# Patient Record
Sex: Female | Born: 1960
Health system: Southern US, Community
[De-identification: ages and names within clinical notes are randomized; demographics above are authoritative.]

## PROBLEM LIST (undated history)

## (undated) DIAGNOSIS — M1990 Unspecified osteoarthritis, unspecified site: Secondary | ICD-10-CM

## (undated) DIAGNOSIS — Z862 Personal history of diseases of the blood and blood-forming organs and certain disorders involving the immune mechanism: Secondary | ICD-10-CM

## (undated) DIAGNOSIS — D649 Anemia, unspecified: Secondary | ICD-10-CM

## (undated) DIAGNOSIS — F329 Major depressive disorder, single episode, unspecified: Secondary | ICD-10-CM

## (undated) DIAGNOSIS — E785 Hyperlipidemia, unspecified: Secondary | ICD-10-CM

## (undated) DIAGNOSIS — M5442 Lumbago with sciatica, left side: Secondary | ICD-10-CM

## (undated) DIAGNOSIS — G4701 Insomnia due to medical condition: Secondary | ICD-10-CM

## (undated) DIAGNOSIS — M961 Postlaminectomy syndrome, not elsewhere classified: Secondary | ICD-10-CM

## (undated) DIAGNOSIS — D126 Benign neoplasm of colon, unspecified: Secondary | ICD-10-CM

## (undated) DIAGNOSIS — Z8489 Family history of other specified conditions: Secondary | ICD-10-CM

## (undated) DIAGNOSIS — G8929 Other chronic pain: Secondary | ICD-10-CM

## (undated) DIAGNOSIS — K219 Gastro-esophageal reflux disease without esophagitis: Secondary | ICD-10-CM

## (undated) DIAGNOSIS — E119 Type 2 diabetes mellitus without complications: Secondary | ICD-10-CM

## (undated) DIAGNOSIS — I10 Essential (primary) hypertension: Secondary | ICD-10-CM

## (undated) HISTORY — PX: COLONOSCOPY: SHX174

## (undated) HISTORY — DX: Other chronic pain: G89.29

## (undated) HISTORY — PX: BACK SURGERY: SHX140

## (undated) HISTORY — DX: Essential (primary) hypertension: I10

## (undated) HISTORY — DX: Type 2 diabetes mellitus without complications: E11.9

## (undated) HISTORY — DX: Insomnia due to medical condition: G47.01

## (undated) HISTORY — DX: Lumbago with sciatica, left side: M54.42

## (undated) HISTORY — DX: Major depressive disorder, single episode, unspecified: F32.9

## (undated) HISTORY — DX: Benign neoplasm of colon, unspecified: D12.6

## (undated) HISTORY — DX: Hyperlipidemia, unspecified: E78.5

---

## 1983-08-16 HISTORY — PX: TUBAL LIGATION: SHX77

## 1999-04-16 ENCOUNTER — Emergency Department (HOSPITAL_COMMUNITY): Admission: EM | Admit: 1999-04-16 | Discharge: 1999-04-16 | Payer: Self-pay | Admitting: Emergency Medicine

## 2000-08-28 ENCOUNTER — Emergency Department (HOSPITAL_COMMUNITY): Admission: EM | Admit: 2000-08-28 | Discharge: 2000-08-28 | Payer: Self-pay | Admitting: Emergency Medicine

## 2001-01-24 ENCOUNTER — Emergency Department (HOSPITAL_COMMUNITY): Admission: EM | Admit: 2001-01-24 | Discharge: 2001-01-24 | Payer: Self-pay | Admitting: Emergency Medicine

## 2005-09-22 ENCOUNTER — Emergency Department (HOSPITAL_COMMUNITY): Admission: EM | Admit: 2005-09-22 | Discharge: 2005-09-22 | Payer: Self-pay | Admitting: Emergency Medicine

## 2006-11-05 ENCOUNTER — Emergency Department (HOSPITAL_COMMUNITY): Admission: EM | Admit: 2006-11-05 | Discharge: 2006-11-05 | Payer: Self-pay | Admitting: Emergency Medicine

## 2007-10-20 ENCOUNTER — Emergency Department (HOSPITAL_COMMUNITY): Admission: EM | Admit: 2007-10-20 | Discharge: 2007-10-20 | Payer: Self-pay | Admitting: Emergency Medicine

## 2008-03-12 ENCOUNTER — Ambulatory Visit: Payer: Self-pay | Admitting: Internal Medicine

## 2008-03-12 ENCOUNTER — Encounter (INDEPENDENT_AMBULATORY_CARE_PROVIDER_SITE_OTHER): Payer: Self-pay | Admitting: Family Medicine

## 2008-03-12 LAB — CONVERTED CEMR LAB
Cholesterol: 232 mg/dL — ABNORMAL HIGH (ref 0–200)
HDL: 55 mg/dL (ref >39)
Total CHOL/HDL Ratio: 4.2
VLDL: 19 mg/dL (ref 0–40)

## 2008-03-14 ENCOUNTER — Ambulatory Visit: Payer: Self-pay | Admitting: Internal Medicine

## 2008-03-14 ENCOUNTER — Ambulatory Visit (HOSPITAL_COMMUNITY): Admission: RE | Admit: 2008-03-14 | Discharge: 2008-03-14 | Payer: Self-pay | Admitting: Internal Medicine

## 2008-05-24 ENCOUNTER — Encounter: Payer: Self-pay | Admitting: Internal Medicine

## 2008-05-24 ENCOUNTER — Ambulatory Visit: Payer: Self-pay | Admitting: Internal Medicine

## 2008-05-26 ENCOUNTER — Encounter (INDEPENDENT_AMBULATORY_CARE_PROVIDER_SITE_OTHER): Payer: Self-pay | Admitting: Internal Medicine

## 2008-05-27 ENCOUNTER — Ambulatory Visit: Payer: Self-pay | Admitting: *Deleted

## 2008-05-29 ENCOUNTER — Ambulatory Visit (HOSPITAL_COMMUNITY): Admission: RE | Admit: 2008-05-29 | Discharge: 2008-05-29 | Payer: Self-pay | Admitting: Family Medicine

## 2008-06-19 ENCOUNTER — Ambulatory Visit: Payer: Self-pay | Admitting: Internal Medicine

## 2008-07-16 ENCOUNTER — Ambulatory Visit: Payer: Self-pay | Admitting: Internal Medicine

## 2008-07-16 LAB — CONVERTED CEMR LAB
BUN: 24 mg/dL — ABNORMAL HIGH (ref 6–23)
CO2: 25 meq/L (ref 19–32)
Cholesterol: 160 mg/dL (ref 0–200)
Glucose, Bld: 86 mg/dL (ref 70–99)
LDL Cholesterol: 88 mg/dL (ref 0–99)
Potassium: 3.9 meq/L (ref 3.5–5.3)
Sodium: 141 meq/L (ref 135–145)
Total CHOL/HDL Ratio: 3.3
VLDL: 24 mg/dL (ref 0–40)

## 2008-08-22 ENCOUNTER — Ambulatory Visit: Payer: Self-pay | Admitting: Obstetrics & Gynecology

## 2008-08-22 ENCOUNTER — Other Ambulatory Visit: Admission: RE | Admit: 2008-08-22 | Discharge: 2008-08-22 | Payer: Self-pay | Admitting: Obstetrics and Gynecology

## 2008-08-25 ENCOUNTER — Encounter: Payer: Self-pay | Admitting: Obstetrics & Gynecology

## 2008-08-25 LAB — CONVERTED CEMR LAB
Trich, Wet Prep: NONE SEEN
Yeast Wet Prep HPF POC: NONE SEEN

## 2008-09-12 ENCOUNTER — Ambulatory Visit: Payer: Self-pay | Admitting: Family Medicine

## 2008-10-23 ENCOUNTER — Ambulatory Visit: Payer: Self-pay | Admitting: Internal Medicine

## 2009-02-07 ENCOUNTER — Encounter: Payer: Self-pay | Admitting: Emergency Medicine

## 2009-02-08 ENCOUNTER — Ambulatory Visit (HOSPITAL_COMMUNITY): Admission: AD | Admit: 2009-02-08 | Discharge: 2009-02-08 | Payer: Self-pay | Admitting: Obstetrics & Gynecology

## 2009-02-08 ENCOUNTER — Ambulatory Visit: Payer: Self-pay | Admitting: Obstetrics & Gynecology

## 2009-02-19 ENCOUNTER — Ambulatory Visit: Payer: Self-pay | Admitting: Obstetrics & Gynecology

## 2009-03-11 ENCOUNTER — Ambulatory Visit: Payer: Self-pay | Admitting: Internal Medicine

## 2009-03-12 ENCOUNTER — Ambulatory Visit (HOSPITAL_COMMUNITY): Admission: RE | Admit: 2009-03-12 | Discharge: 2009-03-12 | Payer: Self-pay | Admitting: Obstetrics and Gynecology

## 2009-03-12 ENCOUNTER — Ambulatory Visit (HOSPITAL_COMMUNITY): Admission: RE | Admit: 2009-03-12 | Discharge: 2009-03-12 | Payer: Self-pay | Admitting: Internal Medicine

## 2009-03-20 ENCOUNTER — Encounter (INDEPENDENT_AMBULATORY_CARE_PROVIDER_SITE_OTHER): Payer: Self-pay | Admitting: Obstetrics & Gynecology

## 2009-03-20 ENCOUNTER — Ambulatory Visit: Payer: Self-pay | Admitting: Obstetrics & Gynecology

## 2009-03-20 ENCOUNTER — Other Ambulatory Visit: Admission: RE | Admit: 2009-03-20 | Discharge: 2009-03-20 | Payer: Self-pay | Admitting: Obstetrics & Gynecology

## 2009-04-02 ENCOUNTER — Ambulatory Visit: Payer: Self-pay | Admitting: Obstetrics and Gynecology

## 2009-04-02 LAB — CONVERTED CEMR LAB
MCHC: 31.6 g/dL (ref 30.0–36.0)
MCV: 94.8 fL (ref 78.0–100.0)
Platelets: 355 10*3/uL (ref 150–400)
Potassium: 4.2 meq/L (ref 3.5–5.3)
RBC: 3.44 M/uL — ABNORMAL LOW (ref 3.87–5.11)
RDW: 15.5 % (ref 11.5–15.5)

## 2009-05-26 ENCOUNTER — Ambulatory Visit: Payer: Self-pay | Admitting: Obstetrics & Gynecology

## 2009-06-02 ENCOUNTER — Ambulatory Visit (HOSPITAL_COMMUNITY): Admission: RE | Admit: 2009-06-02 | Discharge: 2009-06-02 | Payer: Self-pay | Admitting: Internal Medicine

## 2009-06-10 ENCOUNTER — Ambulatory Visit: Payer: Self-pay | Admitting: Internal Medicine

## 2009-06-10 LAB — CONVERTED CEMR LAB
CO2: 24 meq/L (ref 19–32)
Calcium: 10.3 mg/dL (ref 8.4–10.5)
Cholesterol: 171 mg/dL (ref 0–200)
Creatinine, Ser: 0.68 mg/dL (ref 0.40–1.20)
Direct LDL: 111 mg/dL — ABNORMAL HIGH
HDL: 48 mg/dL (ref >39)
Sodium: 139 meq/L (ref 135–145)
Total CHOL/HDL Ratio: 3.6
Triglycerides: 71 mg/dL (ref <150)

## 2009-08-12 ENCOUNTER — Ambulatory Visit: Payer: Self-pay | Admitting: Obstetrics and Gynecology

## 2009-08-15 DIAGNOSIS — E782 Mixed hyperlipidemia: Secondary | ICD-10-CM | POA: Insufficient documentation

## 2009-08-15 DIAGNOSIS — E119 Type 2 diabetes mellitus without complications: Secondary | ICD-10-CM

## 2009-08-15 DIAGNOSIS — I10 Essential (primary) hypertension: Secondary | ICD-10-CM

## 2009-08-15 DIAGNOSIS — E785 Hyperlipidemia, unspecified: Secondary | ICD-10-CM

## 2009-08-15 HISTORY — DX: Type 2 diabetes mellitus without complications: E11.9

## 2009-08-15 HISTORY — DX: Essential (primary) hypertension: I10

## 2009-08-15 HISTORY — DX: Hyperlipidemia, unspecified: E78.5

## 2009-08-25 ENCOUNTER — Ambulatory Visit (HOSPITAL_COMMUNITY): Admission: RE | Admit: 2009-08-25 | Discharge: 2009-08-25 | Payer: Self-pay | Admitting: Family Medicine

## 2009-09-01 ENCOUNTER — Ambulatory Visit: Payer: Self-pay | Admitting: Internal Medicine

## 2009-09-01 LAB — CONVERTED CEMR LAB
Albumin: 5.1 g/dL (ref 3.5–5.2)
BUN: 16 mg/dL (ref 6–23)
CO2: 26 meq/L (ref 19–32)
Calcium: 10.2 mg/dL (ref 8.4–10.5)
Chloride: 102 meq/L (ref 96–112)
Cholesterol: 167 mg/dL (ref 0–200)
Creatinine, Ser: 0.65 mg/dL (ref 0.40–1.20)
Glucose, Bld: 107 mg/dL — ABNORMAL HIGH (ref 70–99)
HDL: 43 mg/dL (ref >39)
Total CHOL/HDL Ratio: 3.9
Triglycerides: 91 mg/dL (ref <150)

## 2009-09-09 ENCOUNTER — Ambulatory Visit: Payer: Self-pay | Admitting: Obstetrics and Gynecology

## 2009-09-25 ENCOUNTER — Encounter (INDEPENDENT_AMBULATORY_CARE_PROVIDER_SITE_OTHER): Payer: Self-pay | Admitting: Internal Medicine

## 2009-09-25 ENCOUNTER — Ambulatory Visit: Payer: Self-pay | Admitting: Family Medicine

## 2009-09-25 ENCOUNTER — Telehealth (INDEPENDENT_AMBULATORY_CARE_PROVIDER_SITE_OTHER): Payer: Self-pay | Admitting: *Deleted

## 2009-09-25 LAB — CONVERTED CEMR LAB
Alkaline Phosphatase: 63 units/L (ref 39–117)
BUN: 22 mg/dL (ref 6–23)
CO2: 24 meq/L (ref 19–32)
Glucose, Bld: 96 mg/dL (ref 70–99)
HCV Ab: NEGATIVE
Hep B S Ab: NEGATIVE
Hepatitis B Surface Ag: NEGATIVE
Total Bilirubin: 0.3 mg/dL (ref 0.3–1.2)

## 2009-10-06 ENCOUNTER — Emergency Department (HOSPITAL_COMMUNITY): Admission: EM | Admit: 2009-10-06 | Discharge: 2009-10-06 | Payer: Self-pay | Admitting: Emergency Medicine

## 2009-10-23 ENCOUNTER — Ambulatory Visit: Payer: Self-pay | Admitting: Internal Medicine

## 2009-10-29 ENCOUNTER — Ambulatory Visit (HOSPITAL_COMMUNITY): Admission: RE | Admit: 2009-10-29 | Discharge: 2009-10-29 | Payer: Self-pay | Admitting: Internal Medicine

## 2009-11-13 ENCOUNTER — Ambulatory Visit: Payer: Self-pay | Admitting: Internal Medicine

## 2009-12-24 ENCOUNTER — Ambulatory Visit: Payer: Self-pay | Admitting: Internal Medicine

## 2010-01-18 ENCOUNTER — Encounter: Admission: RE | Admit: 2010-01-18 | Discharge: 2010-03-03 | Payer: Self-pay | Admitting: Internal Medicine

## 2010-01-20 ENCOUNTER — Ambulatory Visit: Payer: Self-pay | Admitting: Obstetrics and Gynecology

## 2010-03-22 ENCOUNTER — Ambulatory Visit: Payer: Self-pay | Admitting: Internal Medicine

## 2010-03-24 ENCOUNTER — Ambulatory Visit (HOSPITAL_COMMUNITY): Admission: RE | Admit: 2010-03-24 | Discharge: 2010-03-24 | Payer: Self-pay | Admitting: Internal Medicine

## 2010-04-02 ENCOUNTER — Ambulatory Visit: Payer: Self-pay | Admitting: Obstetrics & Gynecology

## 2010-04-16 ENCOUNTER — Ambulatory Visit: Payer: Self-pay | Admitting: Internal Medicine

## 2010-05-17 ENCOUNTER — Encounter: Admission: RE | Admit: 2010-05-17 | Discharge: 2010-05-17 | Payer: Self-pay | Admitting: Internal Medicine

## 2010-06-09 ENCOUNTER — Ambulatory Visit (HOSPITAL_COMMUNITY): Admission: RE | Admit: 2010-06-09 | Discharge: 2010-06-09 | Payer: Self-pay | Admitting: Internal Medicine

## 2010-06-16 ENCOUNTER — Encounter: Admission: RE | Admit: 2010-06-16 | Discharge: 2010-06-16 | Payer: Self-pay | Admitting: Internal Medicine

## 2010-07-15 ENCOUNTER — Ambulatory Visit: Payer: Self-pay | Admitting: Obstetrics and Gynecology

## 2010-07-16 ENCOUNTER — Encounter: Payer: Self-pay | Admitting: Obstetrics and Gynecology

## 2010-07-16 LAB — CONVERTED CEMR LAB
Trich, Wet Prep: NONE SEEN
Yeast Wet Prep HPF POC: NONE SEEN

## 2010-07-27 ENCOUNTER — Ambulatory Visit (HOSPITAL_COMMUNITY)
Admission: RE | Admit: 2010-07-27 | Discharge: 2010-07-27 | Payer: Self-pay | Source: Home / Self Care | Attending: Family Medicine | Admitting: Family Medicine

## 2010-08-11 ENCOUNTER — Ambulatory Visit: Payer: Self-pay | Admitting: Obstetrics and Gynecology

## 2010-09-04 ENCOUNTER — Encounter: Payer: Self-pay | Admitting: Internal Medicine

## 2010-09-05 ENCOUNTER — Encounter: Payer: Self-pay | Admitting: Internal Medicine

## 2010-09-06 ENCOUNTER — Encounter: Payer: Self-pay | Admitting: Internal Medicine

## 2010-09-06 ENCOUNTER — Encounter: Payer: Self-pay | Admitting: Obstetrics & Gynecology

## 2010-09-14 NOTE — Progress Notes (Signed)
Summary: triage/rash  Phone Note Call from Patient   Caller: Patient Reason for Call: Talk to Nurse Summary of Call: Patient states she has had an itchy rash for about a week. She states she is broken out on her arm, neck and left hand.Marland KitchenMarland KitchenShe has been scratching it and it is now swollen with blisters. She denies any new soaps plants or material..Marland KitchenHer Lipator was increased 2-3 weeks ago.Marland KitchenAppointment made in Dr. Hinton Dyer acute schedule this afternnon. Initial call taken by: Conchita Paris,  September 25, 2009 12:03 PM

## 2010-10-13 ENCOUNTER — Encounter: Payer: Self-pay | Admitting: Obstetrics and Gynecology

## 2010-10-13 ENCOUNTER — Ambulatory Visit (INDEPENDENT_AMBULATORY_CARE_PROVIDER_SITE_OTHER): Payer: Self-pay | Admitting: Obstetrics & Gynecology

## 2010-10-13 DIAGNOSIS — N951 Menopausal and female climacteric states: Secondary | ICD-10-CM

## 2010-10-13 DIAGNOSIS — N898 Other specified noninflammatory disorders of vagina: Secondary | ICD-10-CM

## 2010-10-13 DIAGNOSIS — D259 Leiomyoma of uterus, unspecified: Secondary | ICD-10-CM

## 2010-10-13 LAB — CONVERTED CEMR LAB
Trich, Wet Prep: NONE SEEN
Yeast Wet Prep HPF POC: NONE SEEN

## 2010-11-05 NOTE — Progress Notes (Signed)
NAME:  Roberta Clark, Roberta Clark NO.:  [id number]  MEDICAL RECORD NO.:  [id number]           PATIENT TYPE:  A  LOCATION:  [facility]                   FACILITY:  [facility]  PHYSICIAN:  Allie Bossier, MD        DATE OF BIRTH:  1960-09-07  DATE OF SERVICE:  10/13/2010                                 CLINIC NOTE  Roberta Clark is a 50 year old single African American G2, P2.  She has 2 grown children and 3 grandchildren.  She comes in here as a followup from her hot flashes.  She has a known history of fibroids, but she is not having bleeding and does not want a hysterectomy.  She was started on Prozac last month or in December 2011 by Dr. Okey Dupre for treatment of menopausal symptoms.  They have helped somewhat, but she would like the dose increased.  She also complains of a fishy vaginal odor for the last few months.  She is using vinegar and water douches.  She has previously been diagnosed with BV on wet prep.  On exam today, her vagina does have a fishy odor and the vaginal discharge is consistent with BV on a wet prep.  ASSESSMENT/PLAN:  Hot flashes.  I will increase her Prozac to 40 mg in the morning and probable BV with Flagyl.  I have recommended she stop douching.     Allie Bossier, MD    MCD/MEDQ  D:  10/13/2010  T:  10/14/2010  Job:  [id number]

## 2010-11-18 NOTE — Progress Notes (Unsigned)
NAME:  Roberta Clark, Roberta Clark NO.:  [id number]  MEDICAL RECORD NO.:  [id number]          PATIENT TYPE:  LOCATION:  [facility]                     FACILITY:  PHYSICIAN:  Argentina Donovan, MD        DATE OF BIRTH:  1960-10-14  DATE OF SERVICE:  08/11/2010                                 CLINIC NOTE  The patient is a 50 year old African American female who has been on hormone replacement therapy.  Prior to that, she was complaining of significant menopausal symptoms.  Since being back on that, she is complaining more of pelvic discomfort, I think fibroids are back.  We got an ultrasound that did show some small fibroid changes and enlarged uterus.  So we are going to stop the hormones, switch her to Prozac 20 and see how she does on that.  IMPRESSION:  Menopausal symptoms.          ______________________________ Argentina Donovan, MD    PR/MEDQ  D:  08/11/2010  T:  08/12/2010  Job:  [id number]

## 2010-11-22 LAB — DIFFERENTIAL
Lymphocytes Relative: 38 % (ref 12–46)
Lymphs Abs: 3.4 10*3/uL (ref 0.7–4.0)
Monocytes Absolute: 0.5 10*3/uL (ref 0.1–1.0)
Monocytes Relative: 5 % (ref 3–12)
Neutro Abs: 4.9 10*3/uL (ref 1.7–7.7)

## 2010-11-22 LAB — URINALYSIS, ROUTINE W REFLEX MICROSCOPIC
Nitrite: NEGATIVE
Specific Gravity, Urine: 1.016 (ref 1.005–1.030)
Urobilinogen, UA: 1 mg/dL (ref 0.0–1.0)

## 2010-11-22 LAB — CBC
HCT: 32.3 % — ABNORMAL LOW (ref 36.0–46.0)
HCT: 39 % (ref 36.0–46.0)
Hemoglobin: 11 g/dL — ABNORMAL LOW (ref 12.0–15.0)
MCHC: 33.3 g/dL (ref 30.0–36.0)
MCHC: 33.9 g/dL (ref 30.0–36.0)
MCV: 93.7 fL (ref 78.0–100.0)
Platelets: 199 10*3/uL (ref 150–400)
Platelets: 205 10*3/uL (ref 150–400)
Platelets: 258 10*3/uL (ref 150–400)
RBC: 3.45 MIL/uL — ABNORMAL LOW (ref 3.87–5.11)
RDW: 13.2 % (ref 11.5–15.5)
RDW: 13.3 % (ref 11.5–15.5)
WBC: 5 10*3/uL (ref 4.0–10.5)
WBC: 6.8 10*3/uL (ref 4.0–10.5)

## 2010-11-22 LAB — RPR: RPR Ser Ql: NONREACTIVE

## 2010-11-22 LAB — GC/CHLAMYDIA PROBE AMP, GENITAL
Chlamydia, DNA Probe: NEGATIVE
GC Probe Amp, Genital: NEGATIVE

## 2010-11-22 LAB — WET PREP, GENITAL
WBC, Wet Prep HPF POC: NONE SEEN
Yeast Wet Prep HPF POC: NONE SEEN

## 2010-11-22 LAB — URINE MICROSCOPIC-ADD ON

## 2010-11-22 LAB — COMPREHENSIVE METABOLIC PANEL
Albumin: 4.5 g/dL (ref 3.5–5.2)
Alkaline Phosphatase: 41 U/L (ref 39–117)
BUN: 10 mg/dL (ref 6–23)
Calcium: 9.5 mg/dL (ref 8.4–10.5)
Creatinine, Ser: 0.67 mg/dL (ref 0.4–1.2)
Potassium: 4 mEq/L (ref 3.5–5.1)
Total Protein: 8.2 g/dL (ref 6.0–8.3)

## 2010-11-22 LAB — CREATININE, SERUM
GFR calc Af Amer: >60 mL/min (ref >60)
GFR calc non Af Amer: >60 mL/min (ref >60)

## 2010-11-22 LAB — URINE CULTURE: Colony Count: >=100000

## 2010-11-22 LAB — PREGNANCY, URINE: Preg Test, Ur: NEGATIVE

## 2010-11-22 LAB — FOLLICLE STIMULATING HORMONE: FSH: 19.4 m[IU]/mL

## 2010-11-29 LAB — POCT PREGNANCY, URINE: Preg Test, Ur: NEGATIVE

## 2010-11-29 LAB — POCT URINALYSIS DIP (DEVICE)
Bilirubin Urine: NEGATIVE
Glucose, UA: NEGATIVE mg/dL
Nitrite: NEGATIVE
Urobilinogen, UA: 0.2 mg/dL (ref 0.0–1.0)

## 2010-12-28 NOTE — Discharge Summary (Signed)
NAMEMarland Kitchen  JAPJI, KOK NO.:  [id number]   MEDICAL RECORD NO.:  [id number]          PATIENT TYPE:  EMS   LOCATION:  [facility]                           FACILITY:  [facility]   PHYSICIAN:  Tilda Burrow, M.D. DATE OF BIRTH:  [birth year]   DATE OF ADMISSION:  02/07/2009  DATE OF DISCHARGE:  02/08/2009                               DISCHARGE SUMMARY   ADMITTING DIAGNOSES:  1. Lower abdominal pain, severe, right greater than left.  2. Rule out urinary tract infection.  3. Rule out appendicitis.   DISCHARGE DIAGNOSES:  1. Right lower quadrant pain, improved.  2. Appendicitis, ruled out.  3. Urinary tract infection.  4. Right ovarian cyst in perimenopausal female.  5. Endometrial thickening in perimenopausal female with borderline      follicle-stimulating hormone.  6. Bacterial vaginosis.  7. Hypokalemia.   DISCHARGE MEDICATIONS:  1. Provera 10 mg p.o. daily x14 days to bring on menses.  2. Percocet 5/325, 20 tablets 1 p.o. q.6 h. p.r.n. pain.  3. Septra DS 1 p.o. b.i.d. x7 days for UTI.  4. Metronidazole 500 mg p.o. b.i.d. x7 days for vaginal infection.  5. K-Dur 20 mEq 30 tablets 1 p.o. daily for low potassium.   HOSPITAL SUMMARY:  This 50 year old female, gravida __________, para 2,  LMP 2 years ago with intermittent spotting since then at times, was  admitted at [facility] after referral from [facility] where she  was sent for right lower quadrant pain and suprapubic pain x2 days that  had become severe in nature.  The patient has had intermittent lower  abdominal discomfort for 2-3 months, before that have been acutely worse  for 2-3 days.  Evaluation by general surgeon was performed and GYN  consult requested and transferred to [facility] for assessment.  Pelvic evaluation had included an ultrasound which showed a 14 cm long  uterus showing fibroids, right adnexal, slight enlargement of the ovary  with pelvic fluid, the uterus showed endometrial  thickening with  endometrial stripe for 1.7 cm.  General Surgery consult had felt that  appendicitis was unlikely and GYN consultation requested.  The patient  was transferred to [facility] for evaluation.   Evaluation here was notable for GYN history with abnormal Pap smear in  January 2010, with negative colposcopy.  Followup Pap is scheduled for  August.  She is noted also to have bacterial vaginosis, a slightly  enlarged uterus that feels smaller than the 14 cm estimated on  ultrasound.  Laboratory data identified hypokalemia and hemoglobin was  noted during observation dropped from 13 to 11 during the time of  vigorous fluid hydration.   On February 08, 2009, a [facility] valve was obtained which was 41, which is a mid  ovulatory level slightly lower than postmenopausal levels though it is  noted that the patient had received some hydration prior to this blood  sampling.  The patient was reassessed by General Surgery and cleared for  appendicitis and given clear liquids earlier on the morning of February 08, 2009, and then regular diet at lunch.  Pain was gradually improving and  the patient was therefore sent home for followup at her GYN Clinic.   The patient has been scheduled to receive her Provera in addition to the  above-mentioned therapies, expected to have withdrawal menses in about 2  weeks and to have her repeat ultrasound performed shortly after  completion of that medication regimen.  She has a followup appointment  scheduled in August and this will be expanded  to include followup  review of the ultrasound to check for endometrial thickening and the  status of the right ovary.   Addendum: Appointment will be moved up to early July.      Tilda Burrow, M.D.  Electronically Signed     JVF/MEDQ  D:  02/08/2009  T:  02/09/2009  Job:  [id number]   cc:   Ardeth Sportsman, MD  [address] [place] [address]   [facility]

## 2010-12-28 NOTE — Group Therapy Note (Signed)
NAME:  Roberta Clark, Roberta Clark                      ACCOUNT NO.:  o   MEDICAL RECORD NO.:  [id number]          PATIENT TYPE:  WOC   LOCATION:  [facility]                   FACILITY:  [facility]   PHYSICIAN:  Tinnie Gens, MD        DATE OF BIRTH:  10-18-1960   DATE OF SERVICE:  09/12/2008                                  CLINIC NOTE   CHIEF COMPLAINT:  Followup colpo.   HISTORY OF PRESENT ILLNESS:  The patient is a 50 year old gravida 2,  para 2, who was seen for colposcopy for ASCUS and high-risk HPV on  August 22, 2008.  She was also treated for BV at that time.  However,  she has never gotten a prescription filled.  The patient also complains  of lower abdominal discomfort today though she has had __________  prescription, we will treat.  Her pathology is reviewed today.  Biopsy  results show koilocytic atypia consistent with HPV effect and a benign  ECC, which is consistent with her Pap smear.   PHYSICAL EXAMINATION:  VITAL SIGNS:  As noted in the chart.  GENERAL:  She is a well-developed, well-nourished female in no acute  distress.  ABDOMEN:  Soft, nontender, and nondistended.   IMPRESSION:  cervical intraepithelial neoplasia 1.   PLAN:  1. Followup Pap in 6 months.  2. Prescription for Flagyl.  I called in for this patient for history      of __________.  She will follow up with [facility] for other      medical needs.           ______________________________  Tinnie Gens, MD     TP/MEDQ  D:  09/12/2008  T:  09/13/2008  Job:  [id number]

## 2010-12-28 NOTE — Group Therapy Note (Signed)
NAME:  Roberta, Clark NO.:  [id number]   MEDICAL RECORD NO.:  [id number]          PATIENT TYPE:  WOC   LOCATION:  [facility]                   [facility]ACILITY:  [facility]   PHYSICIAN:  Argentina Donovan, MD        DATE OF BIRTH:  Dec 28, 1960   DATE OF SERVICE:  04/02/2009                                  CLINIC NOTE   The patient is a 50 year old African American female who in July was  admitted to the hospital for workup for right lower quadrant pain, ruled  out appendicitis and urinary tract infection, slightly increased uterine  size and on followup exam by Dr. Perlie Gold on March 20, 2009, she had a  tender fibroid on that side being 50 years old and the pain has been  going on severely for about 3 months and the ultrasound is completely  normal with exception of the small fibroids.  I think that a shot of  Depo-Lupron might biaz some time so that eventually when menopause kicks  and her pain will go away and the fibroids will start to shrink.  She  was admitted to the hospital, found to be hypokalemic, had been on  hypertension medication, and also was somewhat anemic, they did a workup  for her anemia and placed her on potassium and iron at discharge.  We  are going to follow that up today and requested to see whether she needs  to continue on either one of these things.  She does have chronic  constipation with a bowel movement every 2-3 days, currently she has  increased her fiber.  She seems to be taking enteral fluid from what she  describes everyday.  Her Pap smear and endometrial biopsy were normal.  She is able to get the Depo-Provera.  We will have her come back 2  months after that and evaluate her.  I wonder that once she first gets  it because of degeneration of fibroids, her pain may increase, but then  gradually she will get better.   IMPRESSION:  Right lower quadrant pain, probably secondary to her  leiomyomata, anemia, and hypokalemia.     ______________________________  Argentina Donovan, MD     PR/MEDQ  D:  04/02/2009  T:  04/03/2009  Job:  [id number]

## 2010-12-28 NOTE — Group Therapy Note (Signed)
NAME:  Roberta Clark, WINEGARDNER NO.:  [id number]   MEDICAL RECORD NO.:  [id number]          PATIENT TYPE:  WOC   LOCATION:  [facility]                   FACILITY:  [facility]   PHYSICIAN:  Dorthula Perfect, MD     DATE OF BIRTH:  [birth year]   DATE OF SERVICE:  03/20/2009                                  CLINIC NOTE   HISTORY:  This is a 50 year old black female who was seen by myself here  on February 19, 2009, with a problem of right lower quadrant pain that was  probably secondary to degenerating myoma, right ovarian cyst, and  postmenopausal bleeding, which was secondary to receiving Provera.  She  had not had any vaginal bleeding or a period for 2 years prior to this  time.  When she was seen on February 19, 2009, she presented for an  endometrial biopsy when probably she was having heavy withdrawal bleed  from the Provera.  So, I elected not to waste the procedure at that  time.  Her ultrasound that was done on March 12, 2009, revealed a uterus  length which was 11.2 cm.  She had multiple uterine fibroids.  Both  ovaries were pretty much normal.  She returns today for when she says  this is a Pap smear.  I believe, however, because of the fact that she  is not bleeding now and as endometrial biopsy needs to be done.  When  she had the withdrawal bleeding from the Provera, she bled heavy for  many, many days.  This was at least 3 weeks ago.   Her abdomen is soft.  There is some tenderness in the right lower  quadrant.  Bimanual exam, external genitalia and BUS glands were normal.  Vaginal was epithelialized as was the cervix.  Pap smear was done.  Uterus is upper limits of normal size to perhaps 6-8 weeks' size with a  very tender 4-5 cm myoma on the right side.  Left adnexa was negative.   IMPRESSION:  1. Right lower quadrant pain secondary to the myoma, which is probably      degenerating.  2. Postmenopausal bleeding.   DISPOSITION:  1. Pap smear.  2. Endometrial biopsy was  performed.  The uterus sounded 9 cm.  A lot      of tissue was aspirated.  The patient will return in 2 weeks for      results.  She may indeed require a hysterectomy.           ______________________________  Dorthula Perfect, MD     ER/MEDQ  D:  03/20/2009  T:  03/21/2009  Job:  [id number]

## 2010-12-28 NOTE — Group Therapy Note (Signed)
NAME:  Roberta Clark, Roberta Clark NO.:  [id number]   MEDICAL RECORD NO.:  [id number]          PATIENT TYPE:  WOC   [facility]ATION:  [facility]                   [facility]ACILITY:  [facility]   PHYSICIAN:  Dorthula Perfect, MD     DATE OF BIRTH:  1960/09/05   DATE OF SERVICE:  02/19/2009                                  CLINIC NOTE   A 50 year old black female was seen here at the hospital, transferred  from [facility] to the [facility] for severe right lower quadrant pain.  Evaluation included a urinary tract infection for which she was treated,  a 2.3-cm right ovarian cyst on both ultrasound and CT, and fair-sized  fibroids including a subserosal one on the right side going towards the  adnexal area that measured about 4.3 cm.  Her last menstrual period had  been in 2008.  When she was seen in the [facility]U, she was given Provera to  bring on her period and was given an appointment here today for an  endometrial biopsy.  Since she was seen at [facility] February 07, 2009, her  hurting has improved somewhat but is still on the right side.  She  started withdrawal bleeding yesterday which has been fairly heavy.   When she was seen in the hospital, a CT scan was done, part of which is  described above, which showed what was thought to be a phlebolith.  On  ultrasound, it was indeed that and was not a ureteral stone.   The only thing of note in her symptom review is that of constipation.  She will go up to a week without having a bowel movement and she also  uses a liquid laxative.  The bowel movement does seem to relieve the  hurting somewhat but not entirely.   EXAMINATION:  Today, blood pressure 120/86, weight 107 pounds, height 5  feet 4.  Examination of her abdomen revealed it to be soft.  The upper  abdomen was completely negative.  She had a moderate amount of right  lower quadrant hurting.  There was no rebound.  PELVIC:  External genitalia and BUS glands were normal.  Upon  introducing the speculum, a large  amount of dark blood came forth (she  started her period yesterday) and then further inspection in the vault  revealed it to be normal with a normal cervix located somewhat  anteriorly.  The uterus is upper limit of normal size with about a 4-cm  very tender myoma on the right lateral side.  I could not feel the right  ovary nor the left.  On the right side, probably because of tenderness.   Because of the heavy withdrawal period from the Provera, I do not think  an endometrial biopsy would give [place] any information today at all.  She  has been scheduled for a repeat ultrasound in the first part of August  but I am having that changed to the latter part of this month.  She will  then return here a week later for:  A.  Result.  B.  A repeat pelvic exam and evaluation of  the extremely tender myoma  and ovarian cyst and then the possibility of an endometrial biopsy.   DIAGNOSES:  1. Right lower quadrant pain probably secondary to a degenerating      myoma.  2. Right ovarian cyst.  3. Postmenopausal bleeding which is secondary to Provera withdrawal.   DISPOSITION:  1. Ultrasound will be moved up to the end of July.  2. She will be seen here 1 week later as described above.  3. A prescription for Percocet 5/325, 24 tablets.           ______________________________  Dorthula Perfect, MD     ER/MEDQ  D:  02/19/2009  T:  02/19/2009  Job:  [id number]

## 2010-12-28 NOTE — Consult Note (Signed)
Roberta Clark, Roberta Clark                  ACCOUNT NO.:  [id number]   MEDICAL RECORD NO.:  [id number]          PATIENT TYPE:  INP   LOCATION:  9318                          FACILITY:  [facility]   PHYSICIAN:  Ardeth Sportsman, MD     DATE OF BIRTH:  March 07, 1961   DATE OF CONSULTATION:  DATE OF DISCHARGE:                                 CONSULTATION   PRIMARY CARE PHYSICIAN:  [facility].   NORMAL GYN:  Dr. Shawnie Pons (Dr. Elsie Lincoln covering).   REQUESTING PHYSICIAN:  Dr. Clarene Duke who from [facility].   SURGEON:  Ardeth Sportsman, MD.   REASON FOR EVALUATION:  lower abdominal pelvic and suprapubic pain,  question of appendicitis.   HISTORY OF PRESENT ILLNESS:  Ms. Richoux is a 50 year old female with no  prior abdominal surgeries who has had abdominal pains for the past few  months.  They have usually been intermittent.  They are usually worse  suprapubic, but also right lower quadrant greater than left lower  quadrant.  They do not seem to be associated with eating or activity.  It reminds her of menstrual cramps.  Her last menstrual period was a  year and a half ago.  She has not had any spotting or bleeding until  this past week when she had an episode of a normal hard day where she  felt she had a normal period.  It was not associated any worsening,  cramping or bleeding.  She denies any vaginal bleeding or discharge.   Last night, this similar pain became constant and much more intense.  The pain became so intense that she actually threw up.  She denies any  sick contacts or change in her diet.  She denies any biliary colic, no  dysphagia, no heartburn or reflux.  She normally has a bowel movement  every day.  It does hurt to strain to urinate or strain to have a bowel  movement.  She denies any frank dysuria or pyuria. She has had urinary  tract infections in the past.  No pyelonephritis in the past.  No  history of inflammatory bowel disease  or bowel syndrome.   The  pain intensified in the emergency room.  She has had a rather  extensive evaluation by Clarene Duke and noted the patient did have a large  fibroid uterus and bilateral ovarian cysts postmenopausal.  She had  above-average free fluid expected for these diagnoses.  The appendix  could not be seen.  Based on these findings, surgical consultation was  requested.   She has tried to find a comfortable position and cannot find any  position that is comfortable for her.  She is a little restless.  She  does not seem to have one position that makes her better.  She had a  little discomfort driving in, but no severe pain with the bumps on the  road.   PAST MEDICAL HISTORY:  1. She has cervical epithelial neoplasia followed by Dr. Shawnie Pons.  2. Hypertension.  3. Tobacco abuse.  She claims she is down to just  a few cigarettes a      day.  4. Hypercholesterolemia.  5. Chronic pain.   GYN HISTORY:  She said her last menstrual was a year and a half ago.  She has had vaginal deliveries in the past that were without any  problems.  No gestational diabetes or preeclampsia or other perinatal  history that she can recall.  She denied tubal ligations or ectopic  pregnancies.  A diagnosis of PID in the past she could not recall.   PAST SURGICAL HISTORY:  No abdominal surgeries.   MEDICATIONS:  She is on:  1. Lipitor 20 mg daily.  2. Tramadol 50 mg every 8 hours p.r.n. pain.  3. Lisinopril/hydrochlorothiazide 10/12.5 daily.   ALLERGIES:  None known.   SOCIAL HISTORY:  Occasionally has some alcohol, no heavy drinking, no  binging, none recently.  No drug abuse.  She has a history of probably  about a 30 pack year history of tobacco but she is down to just a few  cigarettes a day.   FAMILY HISTORY:  Negative for inflammatory disease or GI disorders that  she had can recall or kidney stones.   REVIEW OF SYSTEMS:  Per HPI.  No fevers, chills, or sweats although she  does claim that she feels a little cold  in the ER in her gown only.  No  weight gain or weight loss.  EYES/ENT:  Negative.  CARDIAC/RESPIRATORY:  Negative.  MUSCULOSKELETAL: Negative.  PSYCHIATRIC:  Negative.  NEUROLOGICAL: Negative.  HEME/LYMPH:  Negative.  GI:  As noted above.  No hematochezia or melena, no hematemesis, no coffee-ground emesis.  HEPATIC/RENAL/ENDOCRINE:  Otherwise negative.  GYN:  As noted per HPI.  The rest is negative.   PHYSICAL EXAMINATION:  VITAL SIGNS:  Her temperature is 9.5, her pulse  is 94, respirations 16-20.  She has 10/10 pain, now it is down to around  4/10 pain.  She is 99-100%  SATS on room air.  GENERAL:  She is a well-developed, well-nourished female lying in bed,  anxious but consolable.   PSYCH:  She is anxious but consolable with no evidence of any dementia,  frank psychosis or paranoia.  EYES:  Pupils equal, round and react rive to light.  Her sclerae are  injected but not icteric.  NECK:  Supple, no masses.  Trachea is midline.  HEENT:  She is normocephalic.  Mucous membranes are moist, nasopharynx  and oropharynx are clear.  LYMPH:  No head, neck, axillary or groin lymphadenopathy.  BREASTS:  No obvious sores or lesions.  No nipple discharge.  CHEST:  Clear to auscultation bilaterally.  No wheezes, rales or  rhonchi.  No pain to rib or sternal compression.  HEART:  Regular rate and rhythm.  No murmurs, gallops or rubs.  ABDOMEN:  Soft, a little bit doughy but I would not say distended.  She  is completely nontender in her upper abdomen.  No umbilical discomfort.  She denies ever having any periumbilical pain.  She has no pain over  McBurney's point.  She has no Rovsing's sign.  In her suprapubic region,  she has the most discomfort but no guarding.  She seems to be also  tender in the right groin, but no evidence of any inguinal hernias.  She  has maybe some discomfort to deep pressure but not to percussion or bed  shake.  The pelvic I did not redo based on Dr. Clarene Duke had done  this.  She had noted some more right  fornix/adnexal tenderness greater than  suprapubic region.  No major left adnexal tenderness.  She has positive  clue cells but no white cells.  No yeast noted.  EXTREMITIES:  No clubbing, cyanosis or edema.  RECTAL:  Deferred per patient request.  MUSCULOSKELETAL:  Normal range of motion of the shoulders, elbows,  wrists, hips, knees and ankles.   LABORATORY VALUES:  Her white count is 9 with no left shift.  Hemoglobin  is 13.  Electrolytes are all normal.  LFTs are normal.  Lipase is  normal.  CT scan shows free fluid in the right pelvis.  She has a large  fibroid uterus with bilateral ovarian cysts.  Her endometrium is  prominent.  The cysts around 2-5 cm in size.  Transvaginal pelvic  ultrasound also confirmed similar findings with some moderate free  fluid.  Of note, they could not see the appendix based on ultrasound or  CT scan.  She is rather thin and there is not good fat.  I see no  stranding along her right pericolic gutter around her cecum.  The fluid  is a thin stripe around her fibroid uterus and just behind her cecum but  mostly it is down in the pelvis to my examination.   ASSESSMENT/PLAN:  A 50 year old female with intermittent abdominal pain  for several months now, constant and intense with a positive urinalysis,  new menorrhagia, fibroid uterus, has bilateral ovarian cysts, post  menopausal, free fluid.   This not a classic history.  No physical for appendicitis, although the  fact that she does have some more right-sided pain that seems to be  worse on the right than the left, even though the midline is more  dominant is concerning.  I laid off several options for her.  The most  aggressive  would be to do a diagnostic laparoscopy with appendectomy to  make sure we are not missing anything else.  She did not want to have  surgery at this time.  Another reasonable option is to admit with serial  exams.  I discussed the case  with Dr. Elsie Lincoln who is covering for  Dr. Tinnie Gens as well as Dr. Clarene Duke.  Dr. Penne Lash offered to admit  the patient on the GYN service given abnormal GYN findings and  appendicitis less likely.  I will follow with her for this.   Ciprofloxacin for urinary tract infection.  I will allow on Dr. Penne Lash  to overrule that if she wishes.   At this point, suspicion for pelvic inflammatory disease was low with no  white cells, so no triple antibiotic therapy at this time although it  can be reevaluated.  If her pain does not improve or worsens, then  diagnostic laparoscopy with probable appendectomy tomorrow.  The  technique of  the procedure was discussed.  The risks, benefits, and  alternatives  were discussed.  Different diagnoses were discussed.  The  patient was hold off for right now as far as definitely doing instant  laparoscopy.   Although there is some question of a plain film calcification in the  right for a possible ureteral stone, CT scan does not confirm this  it  looks like phleboliths only.  There was no hydroureter and I discussed  this case at length with Dr. Kearney Hard as well as with Dr. Alfredo Batty.   Hold on any blood pressure medicines for now unless Dr. Penne Lash feels  otherwise.   Hold tramadol for now.   Palliate nausea.  Aggressive fluid resuscitation.   Also, on CT scan, there is no evidence of bowel obstruction.  There is  no hernia.  Contrast easily flows into the right colon.  The bowel does  not seem distended.  She has some stool, but I would not say that she is  profoundly constipated.  Her gallbladder looks normal as well.  There is  no free fluid or stranding along the duodenal sweep or other issues that  I can see.      Ardeth Sportsman, MD  Electronically Signed     SCG/MEDQ  D:  02/07/2009  T:  02/08/2009  Job:  [id number]   cc:   Lesly Dukes, M.D.   Samuel Jester, DO

## 2011-01-24 ENCOUNTER — Ambulatory Visit: Payer: Self-pay | Attending: Family Medicine

## 2011-02-08 ENCOUNTER — Ambulatory Visit: Payer: Self-pay | Admitting: Physical Therapy

## 2011-02-23 ENCOUNTER — Ambulatory Visit: Payer: Self-pay | Attending: Family Medicine | Admitting: Rehabilitation

## 2011-02-23 DIAGNOSIS — M25559 Pain in unspecified hip: Secondary | ICD-10-CM | POA: Insufficient documentation

## 2011-02-23 DIAGNOSIS — M2569 Stiffness of other specified joint, not elsewhere classified: Secondary | ICD-10-CM | POA: Insufficient documentation

## 2011-02-23 DIAGNOSIS — IMO0001 Reserved for inherently not codable concepts without codable children: Secondary | ICD-10-CM | POA: Insufficient documentation

## 2011-02-23 DIAGNOSIS — M545 Low back pain, unspecified: Secondary | ICD-10-CM | POA: Insufficient documentation

## 2011-03-07 ENCOUNTER — Ambulatory Visit: Payer: Self-pay | Admitting: Physical Therapy

## 2011-03-09 ENCOUNTER — Ambulatory Visit: Payer: Self-pay | Admitting: Rehabilitation

## 2011-03-15 ENCOUNTER — Encounter: Payer: Self-pay | Admitting: Rehabilitation

## 2011-03-17 ENCOUNTER — Ambulatory Visit: Payer: Self-pay | Attending: Rehabilitation | Admitting: Rehabilitation

## 2011-03-17 DIAGNOSIS — M545 Low back pain, unspecified: Secondary | ICD-10-CM | POA: Insufficient documentation

## 2011-03-17 DIAGNOSIS — M25559 Pain in unspecified hip: Secondary | ICD-10-CM | POA: Insufficient documentation

## 2011-03-17 DIAGNOSIS — M2569 Stiffness of other specified joint, not elsewhere classified: Secondary | ICD-10-CM | POA: Insufficient documentation

## 2011-03-17 DIAGNOSIS — IMO0001 Reserved for inherently not codable concepts without codable children: Secondary | ICD-10-CM | POA: Insufficient documentation

## 2011-03-22 ENCOUNTER — Ambulatory Visit: Payer: Self-pay | Admitting: Rehabilitation

## 2011-03-29 ENCOUNTER — Ambulatory Visit: Payer: Self-pay | Admitting: Physical Therapy

## 2011-04-04 ENCOUNTER — Encounter: Payer: Self-pay | Admitting: Physical Therapy

## 2011-04-06 ENCOUNTER — Encounter: Payer: Self-pay | Admitting: Physical Therapy

## 2011-04-11 ENCOUNTER — Encounter: Payer: Self-pay | Admitting: Physical Therapy

## 2011-04-14 ENCOUNTER — Encounter: Payer: Self-pay | Admitting: Rehabilitation

## 2011-05-11 ENCOUNTER — Other Ambulatory Visit (HOSPITAL_COMMUNITY): Payer: Self-pay | Admitting: Family Medicine

## 2011-05-11 DIAGNOSIS — Z1231 Encounter for screening mammogram for malignant neoplasm of breast: Secondary | ICD-10-CM

## 2011-06-20 ENCOUNTER — Ambulatory Visit (HOSPITAL_COMMUNITY)
Admission: RE | Admit: 2011-06-20 | Discharge: 2011-06-20 | Disposition: A | Discharge status: Home / Self Care | Payer: Self-pay | Source: Ambulatory Visit | Attending: Family Medicine | Admitting: Family Medicine

## 2011-06-20 DIAGNOSIS — Z1231 Encounter for screening mammogram for malignant neoplasm of breast: Secondary | ICD-10-CM | POA: Insufficient documentation

## 2011-07-27 ENCOUNTER — Ambulatory Visit: Payer: Self-pay | Admitting: Advanced Practice Midwife

## 2011-08-22 ENCOUNTER — Ambulatory Visit: Payer: Self-pay | Admitting: Obstetrics and Gynecology

## 2011-09-02 ENCOUNTER — Ambulatory Visit (INDEPENDENT_AMBULATORY_CARE_PROVIDER_SITE_OTHER): Payer: Self-pay | Admitting: Obstetrics and Gynecology

## 2011-09-02 ENCOUNTER — Encounter: Payer: Self-pay | Admitting: Obstetrics and Gynecology

## 2011-09-02 VITALS — BP 133/85 | HR 72 | Temp 97.5°F | Ht 64.0 in | Wt 117.1 lb

## 2011-09-02 DIAGNOSIS — D219 Benign neoplasm of connective and other soft tissue, unspecified: Secondary | ICD-10-CM | POA: Insufficient documentation

## 2011-09-02 DIAGNOSIS — F17200 Nicotine dependence, unspecified, uncomplicated: Secondary | ICD-10-CM | POA: Insufficient documentation

## 2011-09-02 DIAGNOSIS — A499 Bacterial infection, unspecified: Secondary | ICD-10-CM

## 2011-09-02 DIAGNOSIS — D259 Leiomyoma of uterus, unspecified: Secondary | ICD-10-CM

## 2011-09-02 DIAGNOSIS — N760 Acute vaginitis: Secondary | ICD-10-CM

## 2011-09-02 DIAGNOSIS — Z01419 Encounter for gynecological examination (general) (routine) without abnormal findings: Secondary | ICD-10-CM

## 2011-09-02 MED ORDER — METRONIDAZOLE 500 MG PO TABS: 500.0000 mg | ORAL_TABLET | Freq: Two times a day (BID) | ORAL | Status: DC

## 2011-09-02 NOTE — Progress Notes (Signed)
Subjective:    Roberta Clark is a 51 y.o. female who presents for an annual exam. She is G2P2002 and about 3 yrs postmenopausal. She is here for Pap and Gyn exam and gets primary care through Dr. Andrey Campanile at [facility]. Last Paps were normal in 2011 and 2010. Pap showed ASCUS in 2009. Mammogram was normal 06/2011. Has never had BMD and would like that after we descussed her RF: thin, smoker, sedentary lifestyle and not taking Ca++ or Vit D.   She is still taking Prozac prescribed here after her HRT was stopped due to symptomatic fibroids. Denies abd pain or bleeding.  She has had a malordorous vaginal discharge to the point where she douched about a month ago and the malodor is still present. No vulovaginal pruritis. The patient is not sexually active. GYN screening history: see above. . The patient wears seatbelts: yes. The patient participates in regular exercise: no. Has the patient ever been transfused or tattooed?: no. The patient reports that there is not domestic violence in her life. Mutually monogamous with same partner x 6 yrs and declines STI testing.  Menstrual History: OB History    Grav Para Term Preterm Abortions TAB SAB Ect Mult Living   2 2 2  0 0 0 0 0 0 2       No LMP recorded. Patient is postmenopausal.    The following portions of the patient's history were reviewed and updated as appropriate: allergies, current medications, past family history, past medical history, past social history, past surgical history and problem list.  Review of Systems Pertinent items are noted in HPI.    Objective:    BP 133/85  Pulse 72  Temp(Src) 97.5 F (36.4 C) (Oral)  Ht 5\' 4"  (1.626 m)  Wt 117 lb 1.6 oz (53.116 kg)  BMI 20.10 kg/m2  General Appearance:    Alert, cooperative, no distress, appears stated age  Head:    Normocephalic, without obvious abnormality, atraumatic  Eyes:    PERRL, conjunctiva/corneas clear, EOM's intact, fundi    benign, both eyes  Ears:    Normal TM's and  external ear canals, both ears  Nose:   Nares normal, septum midline, mucosa normal, no drainage    or sinus tenderness  Throat:   Lips, mucosa, and tongue normal; teeth and gums normal  Neck:   Supple, symmetrical, trachea midline, rt ant cervical sl tender lymph node (noted by pt x 1 wk)   thyroid:  no enlargement/tenderness/nodules; no carotid   bruit or JVD  Back:     Symmetric, no curvature, ROM normal, no CVA tenderness  Lungs:     Clear to auscultation bilaterally, respirations unlabored  Chest Wall:    No tenderness or deformity   Heart:    Regular rate and rhythm, S1 and S2 normal, no murmur, rub   or gallop  Breast Exam:    No tenderness, masses, or nipple abnormality  Abdomen:     Soft, non-tender, bowel sounds active all four quadrants,    no masses, no organomegaly  Genitalia:    Normal female without lesion, discharge or tenderness. Mod amt frothy whitish discharge in vault. Cx parous, clean. Ut slightly enlarged? Fibroid felt in rt LUS, no adnexal tenderness or mass.     Extremities:   Extremities normal, atraumatic, no cyanosis or edema  Pulses:   2+ and symmetric all extremities  Skin:   Skin color, texture, turgor normal, no rashes or lesions  Lymph nodes:   Cervical,  supraclavicular, and axillary nodes normal (see above)  Neuro: grossly intact    .   Assessment:    Healthy female exam.  Presumptive BV   Plan:     All questions answered. Await pap smear results. Discussed healthy lifestyle modifications. Agricultural engineer distributed. Thin prep Pap smear. Wet prep. BMD study  Rx Flagyl Start regular exercise and walking. Start Ca++ 1200 mg/d and Vit D Smoking cessation encouraged.  Do not douche BSE reviewed

## 2011-09-02 NOTE — Patient Instructions (Signed)
Smoking Cessation This document explains the best ways for you to quit smoking and new treatments to help. It lists new medicines that can double or triple your chances of quitting and quitting for good. It also considers ways to avoid relapses and concerns you may have about quitting, including weight gain. NICOTINE: A POWERFUL ADDICTION If you have tried to quit smoking, you know how hard it can be. It is hard because nicotine is a very addictive drug. For some people, it can be as addictive as heroin or cocaine. Usually, people make 2 or 3 tries, or more, before finally being able to quit. Each time you try to quit, you can learn about what helps and what hurts. Quitting takes hard work and a lot of effort, but you can quit smoking. QUITTING SMOKING IS ONE OF THE MOST IMPORTANT THINGS YOU WILL EVER DO.  You will live longer, feel better, and live better.   The impact on your body of quitting smoking is felt almost immediately:   Within 20 minutes, blood pressure decreases. Pulse returns to its normal level.   After 8 hours, carbon monoxide levels in the blood return to normal. Oxygen level increases.   After 24 hours, chance of heart attack starts to decrease. Breath, hair, and body stop smelling like smoke.   After 48 hours, damaged nerve endings begin to recover. Sense of taste and smell improve.   After 72 hours, the body is virtually free of nicotine. Bronchial tubes relax and breathing becomes easier.   After 2 to 12 weeks, lungs can hold more air. Exercise becomes easier and circulation improves.   Quitting will reduce your risk of having a heart attack, stroke, cancer, or lung disease:   After 1 year, the risk of coronary heart disease is cut in half.   After 5 years, the risk of stroke falls to the same as a nonsmoker.   After 10 years, the risk of lung cancer is cut in half and the risk of other cancers decreases significantly.   After 15 years, the risk of coronary heart  disease drops, usually to the level of a nonsmoker.   If you are pregnant, quitting smoking will improve your chances of having a healthy baby.   The people you live with, especially your children, will be healthier.   You will have extra money to spend on things other than cigarettes.  FIVE KEYS TO QUITTING Studies have shown that these 5 steps will help you quit smoking and quit for good. You have the best chances of quitting if you use them together: 1. Get ready.  2. Get support and encouragement.  3. Learn new skills and behaviors.  4. Get medicine to reduce your nicotine addiction and use it correctly.  5. Be prepared for relapse or difficult situations. Be determined to continue trying to quit, even if you do not succeed at first.  1. GET READY  Set a quit date.   Change your environment.   Get rid of ALL cigarettes, ashtrays, matches, and lighters in your home, car, and place of work.   Do not let people smoke in your home.   Review your past attempts to quit. Think about what worked and what did not.   Once you quit, do not smoke. NOT EVEN A PUFF!  2. GET SUPPORT AND ENCOURAGEMENT Studies have shown that you have a better chance of being successful if you have help. You can get support in many ways.  Tell  your family, friends, and coworkers that you are going to quit and need their support. Ask them not to smoke around you.   Talk to your caregivers (doctor, dentist, nurse, pharmacist, psychologist, and/or smoking counselor).   Get individual, group, or telephone counseling and support. The more counseling you have, the better your chances are of quitting. Programs are available at local hospitals and health centers. Call your local health department for information about programs in your area.   Spiritual beliefs and practices may help some smokers quit.   Quit meters are small computer programs online or downloadable that keep track of quit statistics, such as amount  of "quit-time," cigarettes not smoked, and money saved.   Many smokers find one or more of the many self-help books available useful in helping them quit and stay off tobacco.  3. LEARN NEW SKILLS AND BEHAVIORS  Try to distract yourself from urges to smoke. Talk to someone, go for a walk, or occupy your time with a task.   When you first try to quit, change your routine. Take a different route to work. Drink tea instead of coffee. Eat breakfast in a different place.   Do something to reduce your stress. Take a hot bath, exercise, or read a book.   Plan something enjoyable to do every day. Reward yourself for not smoking.   Explore interactive web-based programs that specialize in helping you quit.  4. GET MEDICINE AND USE IT CORRECTLY Medicines can help you stop smoking and decrease the urge to smoke. Combining medicine with the above behavioral methods and support can quadruple your chances of successfully quitting smoking. The [place]. Food and Drug Administration (FDA) has approved 7 medicines to help you quit smoking. These medicines fall into 3 categories.  Nicotine replacement therapy (delivers nicotine to your body without the negative effects and risks of smoking):   Nicotine gum: Available over-the-counter.   Nicotine lozenges: Available over-the-counter.   Nicotine inhaler: Available by prescription.   Nicotine nasal spray: Available by prescription.   Nicotine skin patches (transdermal): Available by prescription and over-the-counter.   Antidepressant medicine (helps people abstain from smoking, but how this works is unknown):   Bupropion sustained-release (SR) tablets: Available by prescription.   Nicotinic receptor partial agonist (simulates the effect of nicotine in your brain):   Varenicline tartrate tablets: Available by prescription.   Ask your caregiver for advice about which medicines to use and how to use them. Carefully read the information on the package.    Everyone who is trying to quit may benefit from using a medicine. If you are pregnant or trying to become pregnant, nursing an infant, you are under age 18, or you smoke fewer than 10 cigarettes per day, talk to your caregiver before taking any nicotine replacement medicines.   You should stop using a nicotine replacement product and call your caregiver if you experience nausea, dizziness, weakness, vomiting, fast or irregular heartbeat, mouth problems with the lozenge or gum, or redness or swelling of the skin around the patch that does not go away.   Do not use any other product containing nicotine while using a nicotine replacement product.   Talk to your caregiver before using these products if you have diabetes, heart disease, asthma, stomach ulcers, you had a recent heart attack, you have high blood pressure that is not controlled with medicine, a history of irregular heartbeat, or you have been prescribed medicine to help you quit smoking.  5. BE PREPARED FOR RELAPSE OR   DIFFICULT SITUATIONS  Most relapses occur within the first 3 months after quitting. Do not be discouraged if you start smoking again. Remember, most people try several times before they finally quit.   You may have symptoms of withdrawal because your body is used to nicotine. You may crave cigarettes, be irritable, feel very hungry, cough often, get headaches, or have difficulty concentrating.   The withdrawal symptoms are only temporary. They are strongest when you first quit, but they will go away within 10 to 14 days.  Here are some difficult situations to watch for:  Alcohol. Avoid drinking alcohol. Drinking lowers your chances of successfully quitting.   Caffeine. Try to reduce the amount of caffeine you consume. It also lowers your chances of successfully quitting.   Other smokers. Being around smoking can make you want to smoke. Avoid smokers.   Weight gain. Many smokers will gain weight when they quit, usually  less than 10 pounds. Eat a healthy diet and stay active. Do not let weight gain distract you from your main goal, quitting smoking. Some medicines that help you quit smoking may also help delay weight gain. You can always lose the weight gained after you quit.   Bad mood or depression. There are a lot of ways to improve your mood other than smoking.  If you are having problems with any of these situations, talk to your caregiver. SPECIAL SITUATIONS AND CONDITIONS Studies suggest that everyone can quit smoking. Your situation or condition can give you a special reason to quit.  Pregnant women/new mothers: By quitting, you protect your baby's health and your own.   Hospitalized patients: By quitting, you reduce health problems and help healing.   Heart attack patients: By quitting, you reduce your risk of a second heart attack.   Lung, head, and neck cancer patients: By quitting, you reduce your chance of a second cancer.   Parents of children and adolescents: By quitting, you protect your children from illnesses caused by secondhand smoke.  QUESTIONS TO THINK ABOUT Think about the following questions before you try to stop smoking. You may want to talk about your answers with your caregiver.  Why do you want to quit?   If you tried to quit in the past, what helped and what did not?   What will be the most difficult situations for you after you quit? How will you plan to handle them?   Who can help you through the tough times? Your family? Friends? Caregiver?   What pleasures do you get from smoking? What ways can you still get pleasure if you quit?  Here are some questions to ask your caregiver:  How can you help me to be successful at quitting?   What medicine do you think would be best for me and how should I take it?   What should I do if I need more help?   What is smoking withdrawal like? How can I get information on withdrawal?  Quitting takes hard work and a lot of effort,  but you can quit smoking. FOR MORE INFORMATION  Smokefree.gov (http://www.davis-sullivan.com/) provides free, accurate, evidence-based information and professional assistance to help support the immediate and long-term needs of people trying to quit smoking. Document Released: 07/26/2001 Document Revised: 04/13/2011 Document Reviewed: 05/18/2009 [facility] Patient Information 2012 [place], [place].Place postmenopausal annual exam patient instructions here.

## 2011-09-03 LAB — WET PREP, GENITAL: Trich, Wet Prep: NONE SEEN

## 2011-09-09 ENCOUNTER — Telehealth: Payer: Self-pay | Admitting: *Deleted

## 2011-09-09 DIAGNOSIS — B9689 Other specified bacterial agents as the cause of diseases classified elsewhere: Secondary | ICD-10-CM

## 2011-09-09 NOTE — Telephone Encounter (Signed)
Pt was in office and had a prescription sent to [facility]. They are unable to fill it. Would like for [place] to call it into [facility] on [address], and if they can't fill it call it into [facility].

## 2011-09-12 MED ORDER — METRONIDAZOLE 500 MG PO TABS: 500.0000 mg | ORAL_TABLET | Freq: Two times a day (BID) | ORAL | Status: AC

## 2011-09-12 NOTE — Telephone Encounter (Signed)
Called [facility] pharmacy- spoke w/Brian who verified that they can only fill narcotic Rx for pts with the orange card. I phoned in the Rx for metronidazole to [facility] pharmacy [phone].  I left message for pt that the Rx has been phoned in to [facility] per her request.

## 2012-03-27 ENCOUNTER — Encounter (HOSPITAL_COMMUNITY): Payer: Self-pay | Admitting: Emergency Medicine

## 2012-03-27 ENCOUNTER — Emergency Department (HOSPITAL_COMMUNITY)
Admission: EM | Admit: 2012-03-27 | Discharge: 2012-03-27 | Disposition: A | Discharge status: Home / Self Care | Payer: Self-pay | Attending: Emergency Medicine | Admitting: Emergency Medicine

## 2012-03-27 DIAGNOSIS — G8929 Other chronic pain: Secondary | ICD-10-CM | POA: Insufficient documentation

## 2012-03-27 DIAGNOSIS — E871 Hypo-osmolality and hyponatremia: Secondary | ICD-10-CM | POA: Insufficient documentation

## 2012-03-27 DIAGNOSIS — N390 Urinary tract infection, site not specified: Secondary | ICD-10-CM | POA: Insufficient documentation

## 2012-03-27 DIAGNOSIS — E785 Hyperlipidemia, unspecified: Secondary | ICD-10-CM | POA: Insufficient documentation

## 2012-03-27 DIAGNOSIS — F329 Major depressive disorder, single episode, unspecified: Secondary | ICD-10-CM | POA: Insufficient documentation

## 2012-03-27 DIAGNOSIS — I10 Essential (primary) hypertension: Secondary | ICD-10-CM | POA: Insufficient documentation

## 2012-03-27 DIAGNOSIS — F3289 Other specified depressive episodes: Secondary | ICD-10-CM | POA: Insufficient documentation

## 2012-03-27 DIAGNOSIS — F17200 Nicotine dependence, unspecified, uncomplicated: Secondary | ICD-10-CM | POA: Insufficient documentation

## 2012-03-27 DIAGNOSIS — D649 Anemia, unspecified: Secondary | ICD-10-CM | POA: Insufficient documentation

## 2012-03-27 LAB — CBC WITH DIFFERENTIAL/PLATELET
Basophils Absolute: 0 10*3/uL (ref 0.0–0.1)
Basophils Relative: 0 % (ref 0–1)
HCT: 35.5 % — ABNORMAL LOW (ref 36.0–46.0)
MCHC: 33 g/dL (ref 30.0–36.0)
Monocytes Relative: 9 % (ref 3–12)
Neutro Abs: 6.2 10*3/uL (ref 1.7–7.7)
Neutrophils Relative %: 62 % (ref 43–77)
RBC: 4.04 MIL/uL (ref 3.87–5.11)
WBC: 10 10*3/uL (ref 4.0–10.5)

## 2012-03-27 LAB — COMPREHENSIVE METABOLIC PANEL
Albumin: 4.1 g/dL (ref 3.5–5.2)
Alkaline Phosphatase: 76 U/L (ref 39–117)
BUN: 17 mg/dL (ref 6–23)
Potassium: 4.2 mEq/L (ref 3.5–5.1)
Sodium: 130 mEq/L — ABNORMAL LOW (ref 135–145)
Total Protein: 8.4 g/dL — ABNORMAL HIGH (ref 6.0–8.3)

## 2012-03-27 LAB — URINALYSIS, ROUTINE W REFLEX MICROSCOPIC
Bilirubin Urine: NEGATIVE
Glucose, UA: NEGATIVE mg/dL
Hgb urine dipstick: NEGATIVE
Protein, ur: NEGATIVE mg/dL
Urobilinogen, UA: 0.2 mg/dL (ref 0.0–1.0)

## 2012-03-27 LAB — URINE MICROSCOPIC-ADD ON

## 2012-03-27 MED ORDER — ONDANSETRON 8 MG PO TBDP: 8.0000 mg | ORAL_TABLET | Freq: Three times a day (TID) | ORAL | Status: AC | PRN

## 2012-03-27 MED ORDER — CEPHALEXIN 250 MG PO CAPS: 250.0000 mg | ORAL_CAPSULE | Freq: Four times a day (QID) | ORAL | Status: AC

## 2012-03-27 MED ORDER — CEPHALEXIN 500 MG PO CAPS: 500.0000 mg | ORAL_CAPSULE | Freq: Four times a day (QID) | ORAL | Status: AC

## 2012-03-27 NOTE — ED Provider Notes (Signed)
History     CSN: [id number]  Arrival date & time 03/27/12  1710   First MD Initiated Contact with Patient 03/27/12 2101      Chief Complaint  Patient presents with  . Generalized Body Aches  . Chills  . Nausea    (Consider location/radiation/quality/duration/timing/severity/associated sxs/prior treatment) HPI Patient with chills, headache, body aches, and abdominal cramps with backpain for three days.  Denies vomiting, but has nausea.  Patient states constipated, but had bowel movement prior to coming to ed.  Mouth feels dry. Some cough, patient is smoker.  PMD Georganna Skeans, [facility]. Past Medical History  Diagnosis Date  . Hypertension   . Hyperlipidemia   . Depression   . Chronic pain     History reviewed. No pertinent past surgical history.  Family History  Problem Relation Age of Onset  . Heart disease Mother   . Hypertension Mother   . Diabetes Mother   . Diabetes Sister     History  Substance Use Topics  . Smoking status: Current Everyday Smoker -- 0.2 packs/day for 35 years  . Smokeless tobacco: Never Used  . Alcohol Use: No    OB History    Grav Para Term Preterm Abortions TAB SAB Ect Mult Living   2 2 2  0 0 0 0 0 0 2      Review of Systems  Allergies  Review of patient's allergies indicates no known allergies.  Home Medications   Current Outpatient Rx  Name Route Sig Dispense Refill  . AMITRIPTYLINE HCL 25 MG PO TABS Oral Take 25 mg by mouth at bedtime.    . CYCLOBENZAPRINE HCL 10 MG PO TABS Oral Take 10 mg by mouth 3 (three) times daily as needed. For pain.    Marland Kitchen FLUOXETINE HCL 40 MG PO CAPS Oral Take 40 mg by mouth daily.    Marland Kitchen HYDROCODONE-ACETAMINOPHEN 5-325 MG PO TABS Oral Take 1 tablet by mouth every 6 (six) hours as needed. For pain.    Marland Kitchen LISINOPRIL PO Oral Take 1 tablet by mouth daily.    . MELOXICAM 15 MG PO TABS Oral Take 15 mg by mouth daily.    Marland Kitchen NABUMETONE 500 MG PO TABS Oral Take 750 mg by mouth 2 (two) times daily.     Marland Kitchen  POTASSIUM CHLORIDE CRYS ER 20 MEQ PO TBCR Oral Take 40 mEq by mouth daily.    Marland Kitchen ROSUVASTATIN CALCIUM 10 MG PO TABS Oral Take 10 mg by mouth daily.    . TRAMADOL HCL 50 MG PO TABS Oral Take 50 mg by mouth every 6 (six) hours as needed. For pain.      BP 131/76  Pulse 106  Temp 98.7 F (37.1 C)  Resp 18  SpO2 99%  Physical Exam  Nursing note and vitals reviewed. Constitutional: She appears well-developed and well-nourished.  HENT:  Head: Normocephalic and atraumatic.  Eyes: Conjunctivae and EOM are normal. Pupils are equal, round, and reactive to light.  Neck: Normal range of motion. Neck supple.  Cardiovascular: Normal rate, regular rhythm, normal heart sounds and intact distal pulses.   Pulmonary/Chest: Effort normal and breath sounds normal.  Abdominal: Soft. Bowel sounds are normal.  Musculoskeletal: Normal range of motion.  Neurological: She is alert.  Skin: Skin is warm and dry.  Psychiatric: She has a normal mood and affect. Thought content normal.    ED Course  Procedures (including critical care time)  Labs Reviewed  URINALYSIS, ROUTINE W REFLEX MICROSCOPIC - Abnormal; Notable for  the following:    APPearance CLOUDY (*)     Ketones, ur 15 (*)     Nitrite POSITIVE (*)     Leukocytes, UA MODERATE (*)     All other components within normal limits  CBC WITH DIFFERENTIAL - Abnormal; Notable for the following:    Hemoglobin 11.7 (*)     HCT 35.5 (*)     All other components within normal limits  COMPREHENSIVE METABOLIC PANEL - Abnormal; Notable for the following:    Sodium 130 (*)     Chloride 92 (*)     Total Protein 8.4 (*)     All other components within normal limits  URINE MICROSCOPIC-ADD ON - Abnormal; Notable for the following:    Squamous Epithelial / LPF FEW (*)     Bacteria, UA MANY (*)     All other components within normal limits   No results found.   No diagnosis found.    MDM  Patient with urine le and nitrite positive.  Mild hyponatremia and  anemia.  Patient with history of anemia.  Plan urine culture and keflex.         Hilario Quarry, MD 03/27/12 2119

## 2012-03-27 NOTE — ED Notes (Signed)
Body aches, chills started 3 days ago-- nausea, not much appetite, drinking water, "think I have over done it with my medicines trying to feel better"

## 2012-03-30 LAB — URINE CULTURE

## 2012-03-31 NOTE — ED Notes (Signed)
+   urine  Patient treated appropriately with Keflex -sensitive to same-chart appended per protocol MD.  

## 2012-07-10 ENCOUNTER — Other Ambulatory Visit: Payer: Self-pay | Admitting: Family Medicine

## 2012-07-10 DIAGNOSIS — Z1231 Encounter for screening mammogram for malignant neoplasm of breast: Secondary | ICD-10-CM

## 2012-07-25 ENCOUNTER — Encounter: Payer: Self-pay | Admitting: Family Medicine

## 2012-07-25 ENCOUNTER — Ambulatory Visit (INDEPENDENT_AMBULATORY_CARE_PROVIDER_SITE_OTHER): Payer: Self-pay | Admitting: Family Medicine

## 2012-07-25 VITALS — BP 151/86 | HR 69 | Temp 98.8°F | Ht 64.0 in | Wt 116.0 lb

## 2012-07-25 DIAGNOSIS — N951 Menopausal and female climacteric states: Secondary | ICD-10-CM

## 2012-07-25 DIAGNOSIS — I10 Essential (primary) hypertension: Secondary | ICD-10-CM | POA: Insufficient documentation

## 2012-07-25 DIAGNOSIS — Z0000 Encounter for general adult medical examination without abnormal findings: Secondary | ICD-10-CM | POA: Insufficient documentation

## 2012-07-25 DIAGNOSIS — F17200 Nicotine dependence, unspecified, uncomplicated: Secondary | ICD-10-CM

## 2012-07-25 DIAGNOSIS — M545 Low back pain: Secondary | ICD-10-CM | POA: Insufficient documentation

## 2012-07-25 DIAGNOSIS — G8929 Other chronic pain: Secondary | ICD-10-CM

## 2012-07-25 DIAGNOSIS — E785 Hyperlipidemia, unspecified: Secondary | ICD-10-CM

## 2012-07-25 MED ORDER — VENLAFAXINE HCL 37.5 MG PO TABS: 37.5000 mg | ORAL_TABLET | Freq: Every day | ORAL | Status: DC

## 2012-07-25 MED ORDER — TRAMADOL HCL 50 MG PO TABS: 100.0000 mg | ORAL_TABLET | Freq: Three times a day (TID) | ORAL | Status: DC | PRN

## 2012-07-25 MED ORDER — LISINOPRIL 10 MG PO TABS: 10.0000 mg | ORAL_TABLET | Freq: Every day | ORAL | Status: DC

## 2012-07-25 NOTE — Assessment & Plan Note (Signed)
She is cutting back on her own and is down to 3 cigarettes daily

## 2012-07-25 NOTE — Assessment & Plan Note (Signed)
Restart Lipitor. Check FLP today.

## 2012-07-25 NOTE — Patient Instructions (Addendum)
If your lab results are normal, I will send you a letter with the results. If abnormal, someone at the clinic will get in touch with you.   Follow-up within 1 month to discuss your pain, follow-up on your hot flashes, and your blood pressure.   Keep up the good work on cutting back on your smoking.

## 2012-07-25 NOTE — Assessment & Plan Note (Signed)
Re-start lisinopril, which she has been out for a month. Check BMET today.

## 2012-07-25 NOTE — Assessment & Plan Note (Signed)
We will try Effexor and self-titrate up after a week if tolerated. Discussed potential side effects. Follow-up in 2-4 weeks.

## 2012-07-25 NOTE — Assessment & Plan Note (Signed)
Pap smear up to date (02/2012 normal) Declines flu shot

## 2012-07-25 NOTE — Progress Notes (Signed)
  Subjective:    Patient ID: Roberta Clark, female    DOB: June 01, 1961, 51 y.o.   MRN: [id number]  HPI New patient visit She has been out of all her medication for a month She was previously seen at [facility]  # Hypertension ROS: denies chest pain, headache  # HLD She last got her cholesterol checked about 6 months ago ROS: denied RUQ pain, myalgias while taking Lipitor  # Tobacco She is cutting back and is now at 3 cigarettes daily  # Preventative No family history of colon cancer  She does not want a flu shot  Last Pap 6 months ago normal  ROS: denies blood in stool  # Hot flashes She took Prozac for this but does not think it helped She gets symptoms nightly and wakes up drenched in sweat  ROS: dyspareunia; denies dysuria; yellow vaginal discharge recently; denies vaginal irritation   Review of Systems  Allergies, medication, past medical history reviewed.      Objective:   Physical Exam GEN: thin PSYCH: normal affect CV: RRR, normal S1/S2, no murmurs PULM: NI WOB; CTAB ABD: soft, NT, ND EXT: no swelling NEURO: grossly normal     Assessment & Plan:

## 2012-07-25 NOTE — Assessment & Plan Note (Signed)
Documentation only. We did not have time to discuss this today. Rx for Tramadol given which she had been taking for 1 month's worth until she can make an appointment to discuss.

## 2012-07-26 ENCOUNTER — Telehealth: Payer: Self-pay | Admitting: Family Medicine

## 2012-07-26 LAB — BASIC METABOLIC PANEL
BUN: 16 mg/dL (ref 6–23)
Chloride: 109 mEq/L (ref 96–112)
Glucose, Bld: 65 mg/dL — ABNORMAL LOW (ref 70–99)
Potassium: 4 mEq/L (ref 3.5–5.3)

## 2012-07-26 LAB — LIPID PANEL
Cholesterol: 208 mg/dL — ABNORMAL HIGH (ref 0–200)
LDL Cholesterol: 142 mg/dL — ABNORMAL HIGH (ref 0–99)
VLDL: 19 mg/dL (ref 0–40)

## 2012-07-26 NOTE — Telephone Encounter (Signed)
LVM notifying of normal labs.  Cholesterol is 140s. Favor <130 in this smoker. She just restarted her statin after a month break, so this should help. We will recheck in about 6 months to re-assess.

## 2012-07-27 ENCOUNTER — Ambulatory Visit (HOSPITAL_COMMUNITY)
Admission: RE | Admit: 2012-07-27 | Discharge: 2012-07-27 | Disposition: A | Discharge status: Home / Self Care | Payer: Self-pay | Source: Ambulatory Visit | Attending: Family Medicine | Admitting: Family Medicine

## 2012-07-27 DIAGNOSIS — Z1231 Encounter for screening mammogram for malignant neoplasm of breast: Secondary | ICD-10-CM | POA: Insufficient documentation

## 2012-08-03 ENCOUNTER — Other Ambulatory Visit: Payer: Self-pay | Admitting: Family Medicine

## 2012-08-03 DIAGNOSIS — R928 Other abnormal and inconclusive findings on diagnostic imaging of breast: Secondary | ICD-10-CM

## 2012-08-21 ENCOUNTER — Ambulatory Visit (INDEPENDENT_AMBULATORY_CARE_PROVIDER_SITE_OTHER): Payer: No Typology Code available for payment source | Admitting: Family Medicine

## 2012-08-21 ENCOUNTER — Other Ambulatory Visit (HOSPITAL_COMMUNITY)
Admission: RE | Admit: 2012-08-21 | Discharge: 2012-08-21 | Disposition: A | Discharge status: Home / Self Care | Payer: No Typology Code available for payment source | Source: Ambulatory Visit | Attending: Family Medicine | Admitting: Family Medicine

## 2012-08-21 VITALS — BP 138/83 | HR 82 | Temp 97.8°F | Ht 64.0 in | Wt 116.0 lb

## 2012-08-21 DIAGNOSIS — E785 Hyperlipidemia, unspecified: Secondary | ICD-10-CM

## 2012-08-21 DIAGNOSIS — N951 Menopausal and female climacteric states: Secondary | ICD-10-CM

## 2012-08-21 DIAGNOSIS — Z124 Encounter for screening for malignant neoplasm of cervix: Secondary | ICD-10-CM

## 2012-08-21 DIAGNOSIS — N898 Other specified noninflammatory disorders of vagina: Secondary | ICD-10-CM

## 2012-08-21 DIAGNOSIS — Z113 Encounter for screening for infections with a predominantly sexual mode of transmission: Secondary | ICD-10-CM | POA: Insufficient documentation

## 2012-08-21 LAB — POCT WET PREP (WET MOUNT)

## 2012-08-21 NOTE — Patient Instructions (Addendum)
I will call you with results.  If you don't hear from me by Thursday, remind me please.   Follow-up in March to re-check your cholesterol.

## 2012-08-22 ENCOUNTER — Telehealth: Payer: Self-pay | Admitting: Family Medicine

## 2012-08-22 DIAGNOSIS — N898 Other specified noninflammatory disorders of vagina: Secondary | ICD-10-CM | POA: Insufficient documentation

## 2012-08-22 NOTE — Assessment & Plan Note (Signed)
Significant improvement on Effexor (maximum dose).

## 2012-08-22 NOTE — Progress Notes (Signed)
  Subjective:    Patient ID: Roberta Clark, female    DOB: Jul 11, 1961, 52 y.o.   MRN: [id number]  HPI  # Hot flashes Effexor is helping. Before she used to get symptoms "all the time", now she gets them about twice a day.  ROS: denies adverse affects  # Vaginal discharge She is concerned about infection.  She is sexually active with 1 partner only.  She denies vaginal irritation, fevers, chills, pain.   # HLD We discussed cholesterol results.  Her LDL was elevated, but she had not taken her cholesterol medication for a month prior. Since her last visit when she received refills, she has been compliant with rosuvastatin.   Review of Systems Allergies, medication, past medical history reviewed.  -Tobacco -HTN     Objective:   Physical Exam Gen: NAD; well-appearing, -nourished PSYCH: pleasant, engaged and normally conversant, appropriate to questions, alert and oriented PULM: NI WOB ABD: soft, NT, ND SKIN: warm, dry, no rash  GU:    Vagina: non-tender; thin white discharge; no significant vaginal atrophy   Cervix: no cervical motion tenderness  Wet prep no yeast, trich, clue cells; 10-20 WBC    Assessment & Plan:

## 2012-08-22 NOTE — Assessment & Plan Note (Signed)
She is endorsing vaginal discharge with tenderness or systemic symptoms. She also endorses dyspareunia.  Wet prep did not show anything significant.  GC/Chlamydia pending.  She was advised to use lubrication during intercourse. If dyspareunia becomes significant, consider vaginal estrogen.

## 2012-08-22 NOTE — Assessment & Plan Note (Signed)
We will re-check LDL in March 2014 to see where it is now she is back on rosuvastatin.

## 2012-08-22 NOTE — Telephone Encounter (Signed)
Patient called and notified of normal labs. She was having discharge and being evaluated for infection.

## 2012-09-04 ENCOUNTER — Ambulatory Visit
Admission: RE | Admit: 2012-09-04 | Discharge: 2012-09-04 | Disposition: A | Discharge status: Home / Self Care | Payer: Self-pay | Source: Ambulatory Visit | Attending: Family Medicine | Admitting: Family Medicine

## 2012-09-04 DIAGNOSIS — R928 Other abnormal and inconclusive findings on diagnostic imaging of breast: Secondary | ICD-10-CM

## 2012-10-01 ENCOUNTER — Telehealth: Payer: Self-pay | Admitting: Family Medicine

## 2012-10-01 NOTE — Telephone Encounter (Signed)
Patient is calling because after today, she will not have anymore Vicodin and her back pain is still so bad that she doesn't think she can go without pain meds.  She would like a refill.  She also said that Dr. Andrey Campanile from [facility] had prescribed Meloxicam and that helped a lot.

## 2012-10-02 MED ORDER — MELOXICAM 15 MG PO TABS: 15.0000 mg | ORAL_TABLET | Freq: Every day | ORAL | Status: DC

## 2012-10-02 NOTE — Telephone Encounter (Signed)
LVM.  No Vicodin refill without appointment.  OK to give meloxicam for a month. Rx sent.   Needs an appointment within a month at least for any more medications for back.

## 2012-10-23 ENCOUNTER — Encounter: Payer: Self-pay | Admitting: Family Medicine

## 2012-10-23 ENCOUNTER — Telehealth: Payer: Self-pay | Admitting: Family Medicine

## 2012-10-23 MED ORDER — ATORVASTATIN CALCIUM 40 MG PO TABS: 40.0000 mg | ORAL_TABLET | Freq: Every day | ORAL | Status: DC

## 2012-10-23 NOTE — Telephone Encounter (Signed)
error 

## 2012-10-23 NOTE — Telephone Encounter (Signed)
Pt is having a hard time getting Crestor - wants to know if she can get generic called to [facility]

## 2012-10-23 NOTE — Telephone Encounter (Signed)
This encounter was created in error - please disregard.

## 2012-10-23 NOTE — Telephone Encounter (Signed)
Crestor not generic. Will change to atoravstatin 40. Patient amenable. Rx sent.

## 2013-03-17 ENCOUNTER — Encounter (HOSPITAL_COMMUNITY): Payer: Self-pay | Admitting: *Deleted

## 2013-03-17 ENCOUNTER — Emergency Department (HOSPITAL_COMMUNITY)
Admission: EM | Admit: 2013-03-17 | Discharge: 2013-03-17 | Disposition: A | Discharge status: Home / Self Care | Payer: BC Managed Care – PPO | Attending: Emergency Medicine | Admitting: Emergency Medicine

## 2013-03-17 DIAGNOSIS — M549 Dorsalgia, unspecified: Secondary | ICD-10-CM | POA: Insufficient documentation

## 2013-03-17 DIAGNOSIS — K5900 Constipation, unspecified: Secondary | ICD-10-CM | POA: Insufficient documentation

## 2013-03-17 DIAGNOSIS — F3289 Other specified depressive episodes: Secondary | ICD-10-CM | POA: Insufficient documentation

## 2013-03-17 DIAGNOSIS — R51 Headache: Secondary | ICD-10-CM | POA: Insufficient documentation

## 2013-03-17 DIAGNOSIS — E785 Hyperlipidemia, unspecified: Secondary | ICD-10-CM | POA: Insufficient documentation

## 2013-03-17 DIAGNOSIS — Z79899 Other long term (current) drug therapy: Secondary | ICD-10-CM | POA: Insufficient documentation

## 2013-03-17 DIAGNOSIS — M545 Low back pain: Secondary | ICD-10-CM

## 2013-03-17 DIAGNOSIS — I10 Essential (primary) hypertension: Secondary | ICD-10-CM | POA: Insufficient documentation

## 2013-03-17 DIAGNOSIS — R1032 Left lower quadrant pain: Secondary | ICD-10-CM | POA: Insufficient documentation

## 2013-03-17 DIAGNOSIS — G8929 Other chronic pain: Secondary | ICD-10-CM | POA: Insufficient documentation

## 2013-03-17 DIAGNOSIS — F329 Major depressive disorder, single episode, unspecified: Secondary | ICD-10-CM | POA: Insufficient documentation

## 2013-03-17 DIAGNOSIS — M5430 Sciatica, unspecified side: Secondary | ICD-10-CM | POA: Insufficient documentation

## 2013-03-17 DIAGNOSIS — F17200 Nicotine dependence, unspecified, uncomplicated: Secondary | ICD-10-CM | POA: Insufficient documentation

## 2013-03-17 MED ORDER — HYDROCODONE-ACETAMINOPHEN 5-325 MG PO TABS: ORAL_TABLET | ORAL | Status: DC

## 2013-03-17 NOTE — ED Provider Notes (Signed)
Medical screening examination/treatment/procedure(s) were performed by non-physician practitioner and as supervising physician I was immediately available for consultation/collaboration.   Urijah Raynor, MD 03/17/13 1425 

## 2013-03-17 NOTE — ED Provider Notes (Signed)
CSN: [id number]     Arrival date & time 03/17/13  1119 History     First MD Initiated Contact with Patient 03/17/13 1130     Chief Complaint  Patient presents with  . Back Pain    left  . Leg Pain    left   (Consider location/radiation/quality/duration/timing/severity/associated sxs/prior Treatment) HPI  Roberta Clark is a 52 y.o. female complaining of low back and left-sided abdominal pain worsening over the last 5 days. Patient has a history of chronic low back pain the pain radiates down the left leg. She rates her pain at 8/10, it is exacerbated by sitting for long periods of time. She denies any numbness, weakness, paresthesia, fever, history of IV drug use or cancer. She denies any change in urination but states that she is a bit constipated with very hard stool. Patient also had a headache last week which is now completely resolved. Patient has been taking Goody powder a home with inadequate relief   Past Medical History  Diagnosis Date  . Hypertension   . Hyperlipidemia   . Depression   . Chronic pain    History reviewed. No pertinent past surgical history. Family History  Problem Relation Age of Onset  . Heart disease Mother   . Hypertension Mother   . Diabetes Mother   . Diabetes Sister    History  Substance Use Topics  . Smoking status: Current Every Day Smoker -- 35 years    Types: Cigarettes  . Smokeless tobacco: Never Used     Comment: 3 cigs per day  . Alcohol Use: No   OB History   Grav Para Term Preterm Abortions TAB SAB Ect Mult Living   2 2 2  0 0 0 0 0 0 2     Review of Systems 10 systems reviewed and found to be negative, except as noted in the HPI   Allergies  Review of patient's allergies indicates no known allergies.  Home Medications   Current Outpatient Rx  Name  Route  Sig  Dispense  Refill  . amitriptyline (ELAVIL) 25 MG tablet   Oral   Take 25 mg by mouth at bedtime as needed for sleep.         . cyclobenzaprine (FLEXERIL) 10 MG  tablet   Oral   Take 10 mg by mouth 3 (three) times daily as needed for muscle spasms.         Marland Kitchen FLUoxetine (PROZAC) 20 MG capsule   Oral   Take 40 mg by mouth daily.         Marland Kitchen HYDROcodone-acetaminophen (NORCO/VICODIN) 5-325 MG per tablet   Oral   Take 1 tablet by mouth every 6 (six) hours as needed for pain.         Marland Kitchen ibuprofen (ADVIL,MOTRIN) 800 MG tablet   Oral   Take 800 mg by mouth every 8 (eight) hours as needed for pain.         Marland Kitchen lisinopril-hydrochlorothiazide (PRINZIDE,ZESTORETIC) 20-12.5 MG per tablet   Oral   Take 1 tablet by mouth daily.         . rosuvastatin (CRESTOR) 10 MG tablet   Oral   Take 10 mg by mouth at bedtime.         Marland Kitchen HYDROcodone-acetaminophen (NORCO/VICODIN) 5-325 MG per tablet      Take 1-2 tablets by mouth every 6 hours as needed for pain.   15 tablet   0    BP 115/73  Pulse 101  Temp(Src) 98.2 F (36.8 C) (Oral)  Resp 20  Ht 5\' 4"  (1.626 m)  Wt 124 lb (56.246 kg)  BMI 21.27 kg/m2  SpO2 99% Physical Exam  Nursing note and vitals reviewed. Constitutional: She is oriented to person, place, and time. She appears well-developed and well-nourished. No distress.  HENT:  Head: Normocephalic.  Mouth/Throat: Oropharynx is clear and moist.  Eyes: Conjunctivae and EOM are normal. Pupils are equal, round, and reactive to light.  Neck: Normal range of motion.  Cardiovascular: Normal rate and intact distal pulses.   Pulmonary/Chest: Effort normal and breath sounds normal. No stridor.  Abdominal: Soft. Bowel sounds are normal. She exhibits no distension and no mass. There is tenderness. There is no rebound and no guarding.  Tenderness to palpation the left lower quadrant with no guarding or rebound  Musculoskeletal: Normal range of motion.  Straight leg raise is positive on the ipsilateral (left side at approximately 45. It is negative on the contralateral side.  DTRs intact and patient has 5 out of 5 strength to the extensor hallux  longus  Neurological: She is alert and oriented to person, place, and time.  Follows commands, Goal oriented speech, Strength is 5 out of 5x4 extremities, patient ambulates with a coordinated in nonantalgic gait. Sensation is grossly intact.  Skin: Skin is warm.  Psychiatric: She has a normal mood and affect.    ED Course   Procedures (including critical care time)  Labs Reviewed - No data to display No results found. 1. Low back pain   2. LLQ abdominal pain   3. HA (headache)     MDM   Filed Vitals:   03/17/13 1130  BP: 115/73  Pulse: 101  Temp: 98.2 F (36.8 C)  TempSrc: Oral  Resp: 20  Height: 5\' 4"  (1.626 m)  Weight: 124 lb (56.246 kg)  SpO2: 99%     Roberta Clark is a 52 y.o. female with exacerbation of chronic lumbar sciatica also left-sided abdominal pain and that this is likely related to her discomfort from constipation. Abdominal exam is nonsurgical. Patient has no systemic complaints. I have asked her to increase her hydration, and take magnesium citrate. We have had an extensive discussion of return precautions patient seemed reliable to followup.   Pt is hemodynamically stable, appropriate for, and amenable to discharge at this time. Pt verbalized understanding and agrees with care plan. All questions answered. Outpatient follow-up and specific return precautions discussed.    Note: Portions of this report may have been transcribed using voice recognition software. Every effort was made to ensure accuracy; however, inadvertent computerized transcription errors may be present      Wynetta Emery, PA-C 03/17/13 1412

## 2013-03-17 NOTE — ED Notes (Signed)
Pt states Friday started having a headache, which she has since been able to get rid of, but also started having L lower back pain radiating down L leg, pt states has had L leg pain before but not like this.

## 2013-05-13 ENCOUNTER — Other Ambulatory Visit: Payer: Self-pay | Admitting: Family Medicine

## 2013-05-13 DIAGNOSIS — D126 Benign neoplasm of colon, unspecified: Secondary | ICD-10-CM | POA: Insufficient documentation

## 2013-05-13 HISTORY — DX: Benign neoplasm of colon, unspecified: D12.6

## 2013-05-20 ENCOUNTER — Other Ambulatory Visit: Payer: Self-pay | Admitting: Family Medicine

## 2013-06-14 ENCOUNTER — Encounter: Payer: Self-pay | Admitting: Diagnostic Neuroimaging

## 2013-06-14 ENCOUNTER — Ambulatory Visit (INDEPENDENT_AMBULATORY_CARE_PROVIDER_SITE_OTHER): Payer: BC Managed Care – PPO | Admitting: Diagnostic Neuroimaging

## 2013-06-14 VITALS — BP 125/83 | HR 83 | Ht 64.0 in | Wt 115.0 lb

## 2013-06-14 DIAGNOSIS — M5416 Radiculopathy, lumbar region: Secondary | ICD-10-CM | POA: Insufficient documentation

## 2013-06-14 DIAGNOSIS — IMO0002 Reserved for concepts with insufficient information to code with codable children: Secondary | ICD-10-CM

## 2013-06-14 NOTE — Progress Notes (Signed)
[facility]  PATIENT: Roberta Clark DOB: 04/15/1961  REFERRING CLINICIAN: Roberson HISTORY FROM: patient REASON FOR VISIT: new consult   HISTORICAL  CHIEF COMPLAINT:  Chief Complaint  Patient presents with  . Back Pain    radiates down left L, some pain in R leg, numbness    HISTORY OF PRESENT ILLNESS:   52 year old right-handed female here for evaluation of back pain.  For past 5 years patient has had low back pain radiating from her lower back down to her left leg, left foot. Now she's having some pain into her right leg. She tried physical therapy in 2012 without relief. Now pain is increasing in severity. Sitting down makes her pain worse. She is on meloxicam, Advil, Aleve, Flexeril as needed for pain control.  REVIEW OF SYSTEMS: Full 14 system review of systems performed and notable only for low back pain only.  ALLERGIES: Allergies  Allergen Reactions  . Lipitor [Atorvastatin]     HOME MEDICATIONS: Outpatient Prescriptions Prior to Visit  Medication Sig Dispense Refill  . amitriptyline (ELAVIL) 25 MG tablet Take 25 mg by mouth at bedtime as needed for sleep.      . cyclobenzaprine (FLEXERIL) 10 MG tablet Take 10 mg by mouth 3 (three) times daily as needed for muscle spasms.      Marland Kitchen FLUoxetine (PROZAC) 20 MG capsule Take 40 mg by mouth daily.      Marland Kitchen ibuprofen (ADVIL,MOTRIN) 800 MG tablet Take 800 mg by mouth every 8 (eight) hours as needed for pain.      Marland Kitchen lisinopril-hydrochlorothiazide (PRINZIDE,ZESTORETIC) 20-12.5 MG per tablet Take 1 tablet by mouth daily.      . meloxicam (MOBIC) 15 MG tablet TAKE 1 TABLET BY MOUTH DAILY.  30 tablet  0  . rosuvastatin (CRESTOR) 10 MG tablet Take 10 mg by mouth at bedtime.       No facility-administered medications prior to visit.    PAST MEDICAL HISTORY: Past Medical History  Diagnosis Date  . Hypertension   . Hyperlipidemia   . Depression   . Chronic pain     PAST SURGICAL HISTORY: History  reviewed. No pertinent past surgical history.  FAMILY HISTORY: Family History  Problem Relation Age of Onset  . Heart disease Mother   . Hypertension Mother   . Diabetes Mother   . Diabetes Sister   . Graves' disease Sister   . Lupus Father   . Graves' disease Brother     SOCIAL HISTORY:  History   Social History  . Marital Status: Single    Spouse Name: N/A    Number of Children: 2  . Years of Education: College   Occupational History  .      Student  .      [employer] (part time)   Social History Main Topics  . Smoking status: Current Every Day Smoker -- 0.05 packs/day for 10 years    Types: Cigarettes  . Smokeless tobacco: Never Used     Comment: 3 cigs per day  . Alcohol Use: No  . Drug Use: No  . Sexual Activity: Yes    Birth Control/ Protection: None   Other Topics Concern  . Not on file   Social History Narrative   Patient lives at home with a friend.   Caffeine Use: none     PHYSICAL EXAM  Filed Vitals:   06/14/13 1030  BP: 125/83  Pulse: 83  Height: 5\' 4"  (1.626 m)  Weight: 115 lb (52.164  kg)    Not recorded    Body mass index is 19.73 kg/(m^2).  GENERAL EXAM: Patient is in no distress; POSITIVE STRAIGHT LEG RAISE ON LEFT.  CARDIOVASCULAR: Regular rate and rhythm, no murmurs, no carotid bruits  NEUROLOGIC: MENTAL STATUS: awake, alert, language fluent, comprehension intact, naming intact CRANIAL NERVE: no papilledema on fundoscopic exam, pupils equal and reactive to light, visual fields full to confrontation, extraocular muscles intact, no nystagmus, facial sensation and strength symmetric, uvula midline, shoulder shrug symmetric, tongue midline. MOTOR: normal bulk and tone, full strength in the BUE, BLE SENSORY: normal and symmetric to light touch, temperature, vibration COORDINATION: finger-nose-finger, fine finger movements normal REFLEXES: BUE 2, KNEES 1, RIGHT ANKLE 1, LEFT ANKLE TRACE. GAIT/STATION: ANTALGIC, SLOW GAIT. Narrow  based gait; able to walk on toes, heels and tandem; romberg is negative   DIAGNOSTIC DATA (LABS, IMAGING, TESTING) - I reviewed patient records, labs, notes, testing and imaging myself where available.  Lab Results  Component Value Date   WBC 10.0 03/27/2012   HGB 11.7* 03/27/2012   HCT 35.5* 03/27/2012   MCV 87.9 03/27/2012   PLT 316 03/27/2012      Component Value Date/Time   NA 146* 07/25/2012 1413   K 4.0 07/25/2012 1413   CL 109 07/25/2012 1413   CO2 28 07/25/2012 1413   GLUCOSE 65* 07/25/2012 1413   BUN 16 07/25/2012 1413   CREATININE 0.69 07/25/2012 1413   CREATININE 0.64 03/27/2012 1945   CALCIUM 9.6 07/25/2012 1413   PROT 8.4* 03/27/2012 1945   ALBUMIN 4.1 03/27/2012 1945   AST 19 03/27/2012 1945   ALT 22 03/27/2012 1945   ALKPHOS 76 03/27/2012 1945   BILITOT 0.3 03/27/2012 1945   GFRNONAA >90 03/27/2012 1945   GFRAA >90 03/27/2012 1945   Lab Results  Component Value Date   CHOL 208* 07/25/2012   HDL 47 07/25/2012   LDLCALC 142* 07/25/2012   LDLDIRECT 111* 06/10/2009   TRIG 94 07/25/2012   CHOLHDL 4.4 07/25/2012   No results found for this basename: HGBA1C   No results found for this basename: VITAMINB12   Lab Results  Component Value Date   TSH 0.842 03/12/2008      ASSESSMENT AND PLAN  52 y.o. year old female here with low back pain for past 5 years with radiating symptoms into left leg. Exam notable for decreased left ankle jerk reflex, left leg pain and antalgic gait.  PLAN: - MRI lumbar spine - consider gabapentin - consider pain mgmt clinic vs surgical eval after MRI results  Orders Placed This Encounter  Procedures  . MR Lumbar Spine Wo Contrast    Return if symptoms worsen or fail to improve.    Suanne Marker, MD 06/14/2013, 11:51 AM Certified in Neurology, Neurophysiology and Neuroimaging  [facility] [address] [place], [place] [address] [phone]

## 2013-06-14 NOTE — Patient Instructions (Signed)
Back Pain, Adult Back pain is very common. The pain often gets better over time. The cause of back pain is usually not dangerous. Most people can learn to manage their back pain on their own.  HOME CARE   Stay active. Start with short walks on flat ground if you can. Try to walk farther each day.  Do not sit, drive, or stand in one place for more than 30 minutes. Do not stay in bed.  Do not avoid exercise or work. Activity can help your back heal faster.  Be careful when you bend or lift an object. Bend at your knees, keep the object close to you, and do not twist.  Sleep on a firm mattress. Lie on your side, and bend your knees. If you lie on your back, put a pillow under your knees.  Only take medicines as told by your doctor.  Put ice on the injured area.  Put ice in a plastic bag.  Place a towel between your skin and the bag.  Leave the ice on for 15-20 minutes, 3-4 times a day for the first 2 to 3 days. After that, you can switch between ice and heat packs.  Ask your doctor about back exercises or massage.  Avoid feeling anxious or stressed. Find good ways to deal with stress, such as exercise.

## 2013-06-17 ENCOUNTER — Telehealth: Payer: Self-pay

## 2013-06-17 NOTE — Telephone Encounter (Signed)
Patient is requesting mediation as was discussed at last OV. She states she is still in pain and pain is at 10:10 without relief from aleve. Please review and order as appropriate. Thank you.

## 2013-06-24 ENCOUNTER — Telehealth: Payer: Self-pay | Admitting: Diagnostic Neuroimaging

## 2013-06-27 NOTE — Telephone Encounter (Signed)
I rx'd gabapentin. If she requires narcotics, then she needs to be referred to pain clinic. -VRP

## 2013-06-28 NOTE — Telephone Encounter (Signed)
I called patient and let her know that gabapentin will be prescribed. Please let us know if it is effective. If it is not, patient will have to be referred to a pain clinic. Patient states that she understands.   Patient requests medication be sent to [facility].

## 2013-07-17 ENCOUNTER — Telehealth: Payer: Self-pay | Admitting: *Deleted

## 2013-07-18 MED ORDER — GABAPENTIN 300 MG PO CAPS: 300.0000 mg | ORAL_CAPSULE | Freq: Two times a day (BID) | ORAL | Status: DC

## 2013-07-18 NOTE — Telephone Encounter (Signed)
Gabapentin rx'd. -VRP

## 2013-09-05 ENCOUNTER — Telehealth: Payer: Self-pay | Admitting: Diagnostic Neuroimaging

## 2013-09-05 NOTE — Telephone Encounter (Signed)
Patient calling to state that Dr. Leta Baptist prescribed medication for pain in her back and hip and patient states the pharmacy does not have it for her. Patient is not sure what medication is called.

## 2013-09-05 NOTE — Telephone Encounter (Signed)
Dr Leta Baptist called in a year supply of Gabapentin for the patient last month, she should have plenty of refills on file.  I called the pharmacy.  Spoke with the pharmacist.  He said they have the Rx on file.  Says it was filled, but never picked up, so they saved it to her chart.  I called the patient back.  Advised her the Rx was at the pharmacy.  She will follow up with them and call us back if needed.

## 2013-10-23 ENCOUNTER — Ambulatory Visit: Payer: BC Managed Care – PPO | Admitting: *Deleted

## 2013-11-05 ENCOUNTER — Encounter: Payer: Self-pay | Admitting: Dietician

## 2013-11-05 ENCOUNTER — Encounter: Payer: No Typology Code available for payment source | Attending: Nurse Practitioner | Admitting: Dietician

## 2013-11-05 VITALS — Ht 64.0 in | Wt 125.2 lb

## 2013-11-05 DIAGNOSIS — Z713 Dietary counseling and surveillance: Secondary | ICD-10-CM | POA: Insufficient documentation

## 2013-11-05 DIAGNOSIS — E119 Type 2 diabetes mellitus without complications: Secondary | ICD-10-CM

## 2013-11-05 DIAGNOSIS — Z833 Family history of diabetes mellitus: Secondary | ICD-10-CM | POA: Insufficient documentation

## 2013-11-05 NOTE — Progress Notes (Signed)
  Medical Nutrition Therapy:  Appt start time: 1200 end time:  4268.   Assessment:  Primary concerns today: Roberta Clark is here today stating she was referred here because her doctor told her she was "slightly diabetic." She reports that she does not know much about diabetes. She has a family history of diabetes (mom). Her mom uses insulin and Roberta Clark expressed fears of having to use needles. She seems motivated to control her diabetes with lifestyle changes. 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 She sometimes has to take the bus to school or work. Roberta Clark lives with her husband. Her HgbA1c was 7.1% in January.  Preferred Learning Style:  No preference indicated   Learning Readiness:   Contemplating  MEDICATIONS: see list   DIETARY INTAKE:  24-hr recall:  B ( AM): "fast in the mornings" maybe some juice or Ensure Snk ( AM): none; maybe a piece of fruit  L ( PM): Burger, fries, and drink Snk ( PM): chips, fruit D ( PM): [place] burgers with gravy, rice, cabbage, bread Snk ( PM): pie or ice cream  Beverages: seldom sodas, lots of water, juice  Usual physical activity: none  Estimated energy needs: 1600-1800 calories 180-200 g carbohydrates (3 servings per meal and 1 serving at snacks)   Progress Towards Goal(s):  No progress.   Nutritional Diagnosis:  [place]-2.2 Altered nutrition-related laboratory As related to family history of diabetes and excessive intake of carbohydrates.  As evidenced by HgbA1c 7.1%.    Intervention:  Nutrition education provided. Explained disease process of diabetes. Discussed HgbA1c and normal ranges. Explained carbohydrate food choices and carb counting. Recommended 3 servings of CHO per meal and 1 serving at snacks. Patient verbalized understanding and had no further questions.   -Increase exercise (walking :) ) -15 grams of carbohydrate = 1  serving -3 servings (45 grams) per meal -1 carb choice at snacks (refer to snack list) -Practice reading food labels -Try Glucerna instead of Ensure -Try having a protein food for breakfast (especially with juice)  -If you check your blood sugars, write them down and record what you ate  Teaching Method Utilized: Visual Auditory Hands on  Handouts given during visit include:  Living Well With Diabetes book  15g CHO+protein snacks  Barriers to learning/adherence to lifestyle change: none  Demonstrated degree of understanding via:  Teach Back   Monitoring/Evaluation:  Dietary intake, exercise, blood sugars/HgbA1c, and body weight in 2 month(s).

## 2013-11-05 NOTE — Patient Instructions (Addendum)
-  Increase exercise (walking :) )  -15 grams of carbohydrate = 1 serving -3 servings (45 grams) per meal -1 carb choice at snacks (refer to snack list)  -Practice reading food labels  -Try Glucerna instead of Ensure  -Try having a protein food for breakfast (especially with juice)   -If you check your blood sugars, write them down and record what you ate

## 2013-11-22 ENCOUNTER — Other Ambulatory Visit: Payer: Self-pay

## 2013-11-22 DIAGNOSIS — Z1231 Encounter for screening mammogram for malignant neoplasm of breast: Secondary | ICD-10-CM

## 2013-11-29 ENCOUNTER — Telehealth: Payer: Self-pay | Admitting: Diagnostic Neuroimaging

## 2013-11-29 NOTE — Telephone Encounter (Signed)
Pt called concerning constant pain with her head, back all the way down and now pt states she is hurting all over even down her left leg sometimes light pain on the right leg. Most of the pain is between her head down to her lower back. Please call pt concerning this matter. Thanks

## 2013-11-29 NOTE — Telephone Encounter (Signed)
Pt calling stating that she is having chronic pain in her head, back and both legs. Pt would like Dr. Leta Baptist to give her a call back please.

## 2013-12-06 NOTE — Telephone Encounter (Signed)
Patient returning call, requests that we please call her after 10 AM since she is in class at this time. Please call and advise.

## 2013-12-06 NOTE — Telephone Encounter (Signed)
Called pt back to make an appt on 12/18/13 with Dr. Leta Baptist. I advised the pt that if she has any other problems, questions or concerns to call the office. Pt verbalized understanding.

## 2013-12-06 NOTE — Telephone Encounter (Signed)
Pls offer follow up appt for patient with me or Jeani Hawking in next 1-2 weeks. Otherwise, she will need to see PCP or pain mgmt clinic.  -VRP

## 2013-12-06 NOTE — Telephone Encounter (Signed)
Pt returned Cathy's phone call, advised pt she was with a pt. Pt would like for someone to return her call concerning previous calls. Thanks

## 2013-12-11 ENCOUNTER — Encounter (INDEPENDENT_AMBULATORY_CARE_PROVIDER_SITE_OTHER): Payer: Self-pay

## 2013-12-11 ENCOUNTER — Ambulatory Visit
Admission: RE | Admit: 2013-12-11 | Discharge: 2013-12-11 | Disposition: A | Discharge status: Home / Self Care | Payer: No Typology Code available for payment source | Source: Ambulatory Visit

## 2013-12-11 DIAGNOSIS — Z1231 Encounter for screening mammogram for malignant neoplasm of breast: Secondary | ICD-10-CM

## 2013-12-18 ENCOUNTER — Encounter: Payer: Self-pay | Admitting: Diagnostic Neuroimaging

## 2013-12-18 ENCOUNTER — Ambulatory Visit (INDEPENDENT_AMBULATORY_CARE_PROVIDER_SITE_OTHER): Payer: Self-pay | Admitting: Diagnostic Neuroimaging

## 2013-12-18 ENCOUNTER — Encounter (INDEPENDENT_AMBULATORY_CARE_PROVIDER_SITE_OTHER): Payer: Self-pay

## 2013-12-18 VITALS — BP 150/87 | HR 72 | Temp 98.0°F | Ht 65.0 in | Wt 125.0 lb

## 2013-12-18 DIAGNOSIS — M5416 Radiculopathy, lumbar region: Secondary | ICD-10-CM

## 2013-12-18 DIAGNOSIS — IMO0002 Reserved for concepts with insufficient information to code with codable children: Secondary | ICD-10-CM

## 2013-12-18 MED ORDER — GABAPENTIN 300 MG PO CAPS: 600.0000 mg | ORAL_CAPSULE | Freq: Three times a day (TID) | ORAL | Status: DC

## 2013-12-18 NOTE — Progress Notes (Signed)
[facility]  PATIENT: Roberta Clark DOB: [birth year]  REFERRING CLINICIAN:  HISTORY FROM: patient  REASON FOR VISIT: follow up   HISTORICAL  CHIEF COMPLAINT:  Chief Complaint  Patient presents with  . Follow-up    HISTORY OF PRESENT ILLNESS:   UPDATE 12/18/13: Since last visit, continues with low back pain radiating to the left leg (groin, thigh) with diff walking. Tried gabapentin 300mg  BID without relief. Has not been contact about MRI that was ordered at last visit.  PRIOR HPI (06/14/13): 53 year old right-handed female here for evaluation of back pain. For past 5 years patient has had low back pain radiating from her lower back down to her left leg, left foot. Now she's having some pain into her right leg. She tried physical therapy in 2012 without relief. Now pain is increasing in severity. Sitting down makes her pain worse. She is on meloxicam, Advil, Aleve, Flexeril as needed for pain control.  REVIEW OF SYSTEMS: Full 14 system review of systems performed and notable only for activity change appetite change excessive sweating excessive thirst back pain walking difficulty neck pain headache numbness.  ALLERGIES: Allergies  Allergen Reactions  . Lipitor [Atorvastatin]     HOME MEDICATIONS: Outpatient Prescriptions Prior to Visit  Medication Sig Dispense Refill  . amitriptyline (ELAVIL) 25 MG tablet Take 25 mg by mouth at bedtime as needed for sleep.      . cyclobenzaprine (FLEXERIL) 10 MG tablet Take 10 mg by mouth 3 (three) times daily as needed for muscle spasms.      Marland Kitchen FLUoxetine (PROZAC) 20 MG capsule Take 40 mg by mouth daily.      Marland Kitchen gemfibrozil (LOPID) 600 MG tablet Take 600 mg by mouth 2 (two) times daily before a meal.      . ibuprofen (ADVIL,MOTRIN) 800 MG tablet Take 800 mg by mouth every 8 (eight) hours as needed for pain.      Marland Kitchen lisinopril-hydrochlorothiazide (PRINZIDE,ZESTORETIC) 20-12.5 MG per tablet Take 1 tablet by mouth daily.      .  meloxicam (MOBIC) 15 MG tablet TAKE 1 TABLET BY MOUTH DAILY.  30 tablet  0  . naproxen sodium (ANAPROX) 220 MG tablet Take 220 mg by mouth 2 (two) times daily with a meal.      . POTASSIUM CHLORIDE PO Take 20 mg by mouth daily.      . rosuvastatin (CRESTOR) 10 MG tablet Take 10 mg by mouth at bedtime.      . gabapentin (NEURONTIN) 300 MG capsule Take 1 capsule (300 mg total) by mouth 2 (two) times daily.  90 capsule  11   No facility-administered medications prior to visit.    PAST MEDICAL HISTORY: Past Medical History  Diagnosis Date  . Hypertension   . Hyperlipidemia   . Depression   . Chronic pain     PAST SURGICAL HISTORY: History reviewed. No pertinent past surgical history.  FAMILY HISTORY: Family History  Problem Relation Age of Onset  . Heart disease Mother   . Hypertension Mother   . Diabetes Mother   . Diabetes Sister   . Graves' disease Sister   . Lupus Father   . Graves' disease Brother     SOCIAL HISTORY:  History   Social History  . Marital Status: Single    Spouse Name: N/A    Number of Children: 2  . Years of Education: College   Occupational History  .      Student  .  [employer] (part time)   Social History Main Topics  . Smoking status: Current Every Day Smoker -- 0.05 packs/day for 10 years    Types: Cigarettes  . Smokeless tobacco: Never Used     Comment: 3 cigs per day  . Alcohol Use: No  . Drug Use: No  . Sexual Activity: Yes    Birth Control/ Protection: None   Other Topics Concern  . Not on file   Social History Narrative   Patient lives at home with a friend.   Caffeine Use: none     PHYSICAL EXAM  Filed Vitals:   12/18/13 1000  BP: 150/87  Pulse: 72  Temp: 98 F (36.7 C)  TempSrc: Oral  Height: 5\' 5"  (1.651 m)  Weight: 125 lb (56.7 kg)    Not recorded    Body mass index is 20.8 kg/(m^2).  GENERAL EXAM:  Patient is in no distress; POSITIVE STRAIGHT LEG RAISE ON LEFT.   CARDIOVASCULAR:  Regular rate  and rhythm, no murmurs, no carotid bruits   NEUROLOGIC:  MENTAL STATUS: awake, alert, language fluent, comprehension intact, naming intact  CRANIAL NERVE: no papilledema on fundoscopic exam, pupils equal and reactive to light, visual fields full to confrontation, extraocular muscles intact, no nystagmus, facial sensation and strength symmetric, uvula midline, shoulder shrug symmetric, tongue midline.  MOTOR: normal bulk and tone, full strength in the BUE, RLE; LLE LIMITED BY PAIN SENSORY: normal and symmetric to light touch, temperature, vibration  COORDINATION: finger-nose-finger, fine finger movements normal  REFLEXES: BUE 2, KNEES 1, RIGHT ANKLE 1, LEFT ANKLE TRACE.  GAIT/STATION: ANTALGIC, SLOW GAIT. LIMPS ON LEFT LEG. Romberg is negative     DIAGNOSTIC DATA (LABS, IMAGING, TESTING) - I reviewed patient records, labs, notes, testing and imaging myself where available.  Lab Results  Component Value Date   WBC 10.0 03/27/2012   HGB 11.7* 03/27/2012   HCT 35.5* 03/27/2012   MCV 87.9 03/27/2012   PLT 316 03/27/2012      Component Value Date/Time   NA 146* 07/25/2012 1413   K 4.0 07/25/2012 1413   CL 109 07/25/2012 1413   CO2 28 07/25/2012 1413   GLUCOSE 65* 07/25/2012 1413   BUN 16 07/25/2012 1413   CREATININE 0.69 07/25/2012 1413   CREATININE 0.64 03/27/2012 1945   CALCIUM 9.6 07/25/2012 1413   PROT 8.4* 03/27/2012 1945   ALBUMIN 4.1 03/27/2012 1945   AST 19 03/27/2012 1945   ALT 22 03/27/2012 1945   ALKPHOS 76 03/27/2012 1945   BILITOT 0.3 03/27/2012 1945   GFRNONAA >90 03/27/2012 1945   GFRAA >90 03/27/2012 1945   Lab Results  Component Value Date   CHOL 208* 07/25/2012   HDL 47 07/25/2012   LDLCALC 142* 07/25/2012   LDLDIRECT 111* 06/10/2009   TRIG 94 07/25/2012   CHOLHDL 4.4 07/25/2012   No results found for this basename: HGBA1C   No results found for this basename: VITAMINB12   Lab Results  Component Value Date   TSH 0.842 03/12/2008    I reviewed images myself  and agree with interpretation. - VRP  03/24/10 MRI LUMBAR - Broad-based disc protrusion at L4-5 contributing to moderate spinal and lateral recess stenosis.     ASSESSMENT AND PLAN  53 y.o. year old female here with low back pain for past 5 years with radiating symptoms into left leg. Exam notable for decreased left ankle jerk reflex, left leg pain and antalgic gait.   PLAN:  - MRI lumbar spine  -  increase gabapentin  - consider pain mgmt clinic vs surgical eval after MRI results  Orders Placed This Encounter  Procedures  . MR Lumbar Spine Wo Contrast   Meds ordered this encounter  Medications  . gabapentin (NEURONTIN) 300 MG capsule    Sig: Take 2 capsules (600 mg total) by mouth 3 (three) times daily.    Dispense:  180 capsule    Refill:  6   Return in about 6 months (around 06/20/2014).    Penni Bombard, MD 03/23/3733, 28:76 AM Certified in Neurology, Neurophysiology and Neuroimaging  [facility] [address], [place], [place] [address] [phone]

## 2013-12-18 NOTE — Patient Instructions (Signed)
I will check MRI lumbar spine.

## 2013-12-22 ENCOUNTER — Ambulatory Visit
Admission: RE | Admit: 2013-12-22 | Discharge: 2013-12-22 | Disposition: A | Discharge status: Home / Self Care | Payer: No Typology Code available for payment source | Source: Ambulatory Visit | Attending: Diagnostic Neuroimaging | Admitting: Diagnostic Neuroimaging

## 2013-12-22 DIAGNOSIS — IMO0002 Reserved for concepts with insufficient information to code with codable children: Secondary | ICD-10-CM

## 2013-12-22 DIAGNOSIS — M5416 Radiculopathy, lumbar region: Secondary | ICD-10-CM

## 2014-01-09 ENCOUNTER — Ambulatory Visit: Payer: Self-pay | Admitting: Dietician

## 2014-01-20 ENCOUNTER — Telehealth: Payer: Self-pay | Admitting: Diagnostic Neuroimaging

## 2014-01-20 DIAGNOSIS — M48061 Spinal stenosis, lumbar region without neurogenic claudication: Secondary | ICD-10-CM

## 2014-01-20 DIAGNOSIS — M5416 Radiculopathy, lumbar region: Secondary | ICD-10-CM

## 2014-01-20 NOTE — Telephone Encounter (Signed)
Patient calling for MRI results from 12/22/13

## 2014-01-20 NOTE — Telephone Encounter (Signed)
Patient calling for MRI results.

## 2014-01-24 NOTE — Telephone Encounter (Signed)
I called patient. Will proceed with neurosurg eval vs pain mgmt. -VRP

## 2014-02-06 ENCOUNTER — Telehealth: Payer: Self-pay | Admitting: *Deleted

## 2014-02-06 NOTE — Telephone Encounter (Signed)
Informed patient that referral has been sent to [facility],gave their number for contact, she verbalized understanding,also called [facility] and left message that a referral had been sent.

## 2014-02-07 ENCOUNTER — Telehealth: Payer: Self-pay

## 2014-02-07 NOTE — Telephone Encounter (Addendum)
Recvd note from [facility]. Pt declined appt, not in network. Patient says she has an "orange card" so she will contact her PCP in reference to helping her find a Neurosurgeon who accepts type of insurance. Patient requested refill on gabapentin 300mg  since increase. Advised to contact Rx. Patient agreed.

## 2014-02-18 ENCOUNTER — Telehealth: Payer: Self-pay | Admitting: Neurology

## 2014-02-18 NOTE — Telephone Encounter (Signed)
Spoke to the new patient referral department at [facility] and they are unable to see patient.  They charge anywhere from $200.00 - $285 for an initial visit and the patient has no insurance, and told them she cannot pay anything.  The patient has just started a new job and should have insurance in 3 months.  The patient was advised that she may be able to be seen at [facility] or in [facility].  [facility] wanted the doctor to be aware of the situation.

## 2014-02-20 NOTE — Telephone Encounter (Signed)
Spoke to patient and we will try referral to an academic hospital like [facility] or [facility] and will let her know.

## 2014-02-20 NOTE — Telephone Encounter (Signed)
Advise patient to see another spine surgeon (ortho or neurosurg) in [place], [place], [place] or [place]. -VRP

## 2014-03-04 ENCOUNTER — Other Ambulatory Visit: Payer: Self-pay | Admitting: Neurology

## 2014-03-04 DIAGNOSIS — IMO0002 Reserved for concepts with insufficient information to code with codable children: Secondary | ICD-10-CM

## 2014-04-24 ENCOUNTER — Telehealth: Payer: Self-pay | Admitting: *Deleted

## 2014-04-29 ENCOUNTER — Telehealth: Payer: Self-pay | Admitting: *Deleted

## 2014-04-29 NOTE — Telephone Encounter (Signed)
Pt calling wanting MRI lumbar spine CD for the second appt at [facility].   She did not have for her first appt.  She has appt next week.   She also wanted CD for her self.  She will call [place] [facility]. I will forward to Butch Penny in [place] to mail copy of CD to neurosurgery [facility].

## 2014-05-01 ENCOUNTER — Other Ambulatory Visit: Payer: Self-pay | Admitting: Family Medicine

## 2014-05-06 ENCOUNTER — Other Ambulatory Visit: Payer: Self-pay | Admitting: *Deleted

## 2014-05-27 DIAGNOSIS — G8929 Other chronic pain: Secondary | ICD-10-CM | POA: Insufficient documentation

## 2014-06-16 ENCOUNTER — Encounter: Payer: Self-pay | Admitting: Diagnostic Neuroimaging

## 2014-08-01 NOTE — Telephone Encounter (Signed)
ROI was done on 04/29/14

## 2014-10-20 ENCOUNTER — Other Ambulatory Visit: Payer: Self-pay | Admitting: Nurse Practitioner

## 2014-10-20 ENCOUNTER — Encounter: Payer: Self-pay | Admitting: Nurse Practitioner

## 2014-10-20 ENCOUNTER — Ambulatory Visit (INDEPENDENT_AMBULATORY_CARE_PROVIDER_SITE_OTHER): Payer: No Typology Code available for payment source | Admitting: Nurse Practitioner

## 2014-10-20 VITALS — BP 149/95 | HR 77 | Temp 98.2°F | Ht 63.0 in | Wt 112.0 lb

## 2014-10-20 DIAGNOSIS — R350 Frequency of micturition: Secondary | ICD-10-CM

## 2014-10-20 DIAGNOSIS — N390 Urinary tract infection, site not specified: Secondary | ICD-10-CM

## 2014-10-20 DIAGNOSIS — N898 Other specified noninflammatory disorders of vagina: Secondary | ICD-10-CM

## 2014-10-20 LAB — POCT URINALYSIS DIP (DEVICE)
BILIRUBIN URINE: NEGATIVE
Glucose, UA: NEGATIVE mg/dL
Hgb urine dipstick: NEGATIVE
Nitrite: POSITIVE — AB
Protein, ur: 30 mg/dL — AB
SPECIFIC GRAVITY, URINE: 1.025 (ref 1.005–1.030)
Urobilinogen, UA: 1 mg/dL (ref 0.0–1.0)
pH: 6.5 (ref 5.0–8.0)

## 2014-10-20 MED ORDER — CEPHALEXIN 500 MG PO CAPS: 500.0000 mg | ORAL_CAPSULE | Freq: Three times a day (TID) | ORAL | Status: DC

## 2014-10-20 NOTE — Progress Notes (Signed)
History:  Roberta Clark a 54 y.o. [id number] who presents to [facility] clinic today for annual exam, pap smear, vaginal discharge and odorous urine. Her last pap was 2-13 and was normal. She gets her mammograms yearly and has had one in December. She has the same sexual partner for last 8 years. She has had a yellow vaginal discharge for 2 months.   The following portions of the patient's history were reviewed and updated as appropriate: allergies, current medications, past family history, past medical history, past social history, past surgical history and problem list.  Review of Systems:  Pertinent items are noted in HPI.  Objective:  Physical Exam BP 149/95 mmHg  Pulse 77  Temp(Src) 98.2 F (36.8 C) (Oral)  Ht 5\' 3"  (1.6 m)  Wt 112 lb (50.803 kg)  BMI 19.84 kg/m2 GENERAL: Well-developed, well-nourished female in no acute distress.  HEENT: Normocephalic, atraumatic.  NECK: Supple. Normal thyroid.  LUNGS: Normal rate. Clear to auscultation bilaterally.  HEART: Regular rate and rhythm with no adventitious sounds.  BREASTS: Symmetric in size. Cystic breast bilaterally, no skin changes, nipple drainage, or lymphadenopathy. ABDOMEN: Soft, nontender, nondistended. No organomegaly. Normal bowel sounds appreciated in all quadrants.  PELVIC: Normal external female genitalia. Vagina is pink and rugated. Normal cervix contour.  Uterus is normal in size. No adnexal mass or tenderness.  EXTREMITIES: No cyanosis, clubbing, or edema, 2+ distal pulses.  Results for orders placed or performed in visit on 10/20/14 (from the past 24 hour(s))  POCT urinalysis dip (device)     Status: Abnormal   Collection Time: 10/20/14  3:04 PM  Result Value Ref Range   Glucose, UA NEGATIVE NEGATIVE mg/dL   Bilirubin Urine NEGATIVE NEGATIVE   Ketones, ur TRACE (A) NEGATIVE mg/dL   Specific Gravity, Urine 1.025 1.005 - 1.030   Hgb urine dipstick NEGATIVE NEGATIVE   pH 6.5 5.0 - 8.0   Protein, ur 30 (A) NEGATIVE mg/dL    Urobilinogen, UA 1.0 0.0 - 1.0 mg/dL   Nitrite POSITIVE (A) NEGATIVE   Leukocytes, UA TRACE (A) NEGATIVE     Labs and Imaging No results found.  Assessment & Plan:  Assessment:  Annual Exam UTI  Plans: She will go to free pap clinic since she has no insurance Keflex 500 mg TID x 7 days Advised to increase fluids Will culture urine Stay on December schedule for mammograms RTC one year  Olegario Messier, NP 10/20/2014 4:49 PM

## 2014-10-20 NOTE — Patient Instructions (Signed)
Bacterial Vaginosis Bacterial vaginosis is a vaginal infection that occurs when the normal balance of bacteria in the vagina is disrupted. It results from an overgrowth of certain bacteria. This is the most common vaginal infection in women of childbearing age. Treatment is important to prevent complications, especially in pregnant women, as it can cause a premature delivery. CAUSES  Bacterial vaginosis is caused by an increase in harmful bacteria that are normally present in smaller amounts in the vagina. Several different kinds of bacteria can cause bacterial vaginosis. However, the reason that the condition develops is not fully understood. RISK FACTORS Certain activities or behaviors can put you at an increased risk of developing bacterial vaginosis, including:  Having a new sex partner or multiple sex partners.  Douching.  Using an intrauterine device (IUD) for contraception. Women do not get bacterial vaginosis from toilet seats, bedding, swimming pools, or contact with objects around them. SIGNS AND SYMPTOMS  Some women with bacterial vaginosis have no signs or symptoms. Common symptoms include:  Grey vaginal discharge.  A fishlike odor with discharge, especially after sexual intercourse.  Itching or burning of the vagina and vulva.  Burning or pain with urination. DIAGNOSIS  Your health care provider will take a medical history and examine the vagina for signs of bacterial vaginosis. A sample of vaginal fluid may be taken. Your health care provider will look at this sample under a microscope to check for bacteria and abnormal cells. A vaginal pH test may also be done.  TREATMENT  Bacterial vaginosis may be treated with antibiotic medicines. These may be given in the form of a pill or a vaginal cream. A second round of antibiotics may be prescribed if the condition comes back after treatment.  HOME CARE INSTRUCTIONS   Only take over-the-counter or prescription medicines as  directed by your health care provider.  If antibiotic medicine was prescribed, take it as directed. Make sure you finish it even if you start to feel better.  Do not have sex until treatment is completed.  Tell all sexual partners that you have a vaginal infection. They should see their health care provider and be treated if they have problems, such as a mild rash or itching.  Practice safe sex by using condoms and only having one sex partner. SEEK MEDICAL CARE IF:   Your symptoms are not improving after 3 days of treatment.  You have increased discharge or pain.  You have a fever. MAKE SURE YOU:   Understand these instructions.  Will watch your condition.  Will get help right away if you are not doing well or get worse. FOR MORE INFORMATION  Centers for Disease Control and Prevention, Division of STD Prevention: www.cdc.gov/std American Sexual Health Association (ASHA): www.ashastd.org  Document Released: 08/01/2005 Document Revised: 05/22/2013 Document Reviewed: 03/13/2013 ExitCare Patient Information 2015 [employer]. This information is not intended to replace advice given to you by your health care provider. Make sure you discuss any questions you have with your health care provider.  

## 2014-10-21 ENCOUNTER — Telehealth: Payer: Self-pay | Admitting: *Deleted

## 2014-10-21 DIAGNOSIS — B9689 Other specified bacterial agents as the cause of diseases classified elsewhere: Secondary | ICD-10-CM

## 2014-10-21 DIAGNOSIS — N760 Acute vaginitis: Principal | ICD-10-CM

## 2014-10-21 LAB — WET PREP, GENITAL
TRICH WET PREP: NONE SEEN
YEAST WET PREP: NONE SEEN

## 2014-10-21 LAB — GC/CHLAMYDIA PROBE AMP
CT Probe RNA: NEGATIVE
GC Probe RNA: NEGATIVE

## 2014-10-21 MED ORDER — METRONIDAZOLE 500 MG PO TABS: 500.0000 mg | ORAL_TABLET | Freq: Two times a day (BID) | ORAL | Status: DC

## 2014-10-21 NOTE — Telephone Encounter (Signed)
-----   Message from Olegario Messier, NP sent at 10/21/2014  3:39 PM EST ----- Can you call her in Flagyl 500 mg PO bid x 7 days, thanks, [employer]

## 2014-10-21 NOTE — Telephone Encounter (Signed)
Contacted patient, results/medication information given.  Pt verbalizes understanding.

## 2014-10-22 ENCOUNTER — Telehealth: Payer: Self-pay | Admitting: General Practice

## 2014-10-22 DIAGNOSIS — M539 Dorsopathy, unspecified: Secondary | ICD-10-CM | POA: Insufficient documentation

## 2014-10-22 LAB — URINE CULTURE

## 2014-10-22 NOTE — Telephone Encounter (Signed)
Pt called back and left new message stating that she would like her results. I called back and left message on her personal voice mail. I stated that she has BV (she has had the condition in the past) and requires medication treatment. This is not a sexually transmitted infection. The prescription has been sent to her pharmacy and she may pick it up at her convenience today or tomorrow.

## 2014-10-22 NOTE — Telephone Encounter (Signed)
Med has already been sent to pharmacy. Called patient, no answer- left message stating we are trying to reach you with some non urgent results, please call us back at the clinics

## 2014-10-22 NOTE — Telephone Encounter (Signed)
-----   Message from Olegario Messier, NP sent at 10/21/2014  3:39 PM EST ----- Can you call her in Flagyl 500 mg PO bid x 7 days, thanks, [employer]

## 2014-11-17 ENCOUNTER — Telehealth: Payer: Self-pay | Admitting: *Deleted

## 2014-11-17 NOTE — Telephone Encounter (Signed)
Pt contacted the clinic, states she is having an odor from her urine and thinks she may have a urinary tract infection.  Contacted patient, discussed patient's symptoms.  Pt does not think it is a fishy smell, states it is from her urine.  Requested patient come to the clinic to provide a urine sample for testing.  Pt verbalizes understanding, will come Wed. 11/19/14 in the afternoon.

## 2014-11-19 ENCOUNTER — Ambulatory Visit: Payer: Self-pay | Admitting: *Deleted

## 2014-11-19 DIAGNOSIS — R350 Frequency of micturition: Secondary | ICD-10-CM

## 2014-11-19 LAB — POCT URINALYSIS DIP (DEVICE)
Bilirubin Urine: NEGATIVE
Glucose, UA: NEGATIVE mg/dL
Hgb urine dipstick: NEGATIVE
KETONES UR: NEGATIVE mg/dL
Nitrite: POSITIVE — AB
PROTEIN: NEGATIVE mg/dL
Specific Gravity, Urine: >=1.03 (ref 1.005–1.030)
Urobilinogen, UA: 0.2 mg/dL (ref 0.0–1.0)
pH: 5.5 (ref 5.0–8.0)

## 2014-11-19 NOTE — Progress Notes (Signed)
Pt dropped off urine for evaluation of urinary tract infection.  Urine positive for nitrates, sent for culture.

## 2014-11-21 LAB — URINE CULTURE: Colony Count: >=100000

## 2014-11-24 ENCOUNTER — Telehealth: Payer: Self-pay | Admitting: General Practice

## 2014-11-24 MED ORDER — CEPHALEXIN 500 MG PO CAPS: 500.0000 mg | ORAL_CAPSULE | Freq: Four times a day (QID) | ORAL | Status: DC

## 2014-11-24 NOTE — Telephone Encounter (Signed)
-----   Message from Mora Bellman, MD sent at 11/24/2014  9:05 AM EDT ----- Please inform patient of positive UTI. Rx e-prescribed  Thanks  peggy

## 2014-11-24 NOTE — Addendum Note (Signed)
Addended by: Mora Bellman on: 11/24/2014 09:05 AM   Modules accepted: Orders

## 2014-11-24 NOTE — Telephone Encounter (Signed)
Called patient and informed her of results and antibiotic at pharmacy. Patient verbalized understanding and asked if that would help with her urine odor. Told patient yes. Patient verbalized understanding and had no other questions

## 2014-12-15 ENCOUNTER — Emergency Department (HOSPITAL_COMMUNITY): Payer: No Typology Code available for payment source

## 2014-12-15 ENCOUNTER — Emergency Department (HOSPITAL_COMMUNITY)
Admission: EM | Admit: 2014-12-15 | Discharge: 2014-12-15 | Disposition: A | Discharge status: Home / Self Care | Payer: No Typology Code available for payment source | Attending: Emergency Medicine | Admitting: Emergency Medicine

## 2014-12-15 ENCOUNTER — Encounter (HOSPITAL_COMMUNITY): Payer: Self-pay | Admitting: Emergency Medicine

## 2014-12-15 DIAGNOSIS — Z79899 Other long term (current) drug therapy: Secondary | ICD-10-CM | POA: Insufficient documentation

## 2014-12-15 DIAGNOSIS — R51 Headache: Secondary | ICD-10-CM | POA: Insufficient documentation

## 2014-12-15 DIAGNOSIS — F329 Major depressive disorder, single episode, unspecified: Secondary | ICD-10-CM | POA: Insufficient documentation

## 2014-12-15 DIAGNOSIS — I10 Essential (primary) hypertension: Secondary | ICD-10-CM | POA: Insufficient documentation

## 2014-12-15 DIAGNOSIS — Z792 Long term (current) use of antibiotics: Secondary | ICD-10-CM | POA: Insufficient documentation

## 2014-12-15 DIAGNOSIS — Z720 Tobacco use: Secondary | ICD-10-CM | POA: Insufficient documentation

## 2014-12-15 DIAGNOSIS — Z8639 Personal history of other endocrine, nutritional and metabolic disease: Secondary | ICD-10-CM | POA: Insufficient documentation

## 2014-12-15 DIAGNOSIS — M436 Torticollis: Secondary | ICD-10-CM | POA: Insufficient documentation

## 2014-12-15 DIAGNOSIS — Z791 Long term (current) use of non-steroidal anti-inflammatories (NSAID): Secondary | ICD-10-CM | POA: Insufficient documentation

## 2014-12-15 DIAGNOSIS — G8929 Other chronic pain: Secondary | ICD-10-CM | POA: Insufficient documentation

## 2014-12-15 DIAGNOSIS — M542 Cervicalgia: Secondary | ICD-10-CM

## 2014-12-15 MED ORDER — CYCLOBENZAPRINE HCL 10 MG PO TABS: 10.0000 mg | ORAL_TABLET | Freq: Two times a day (BID) | ORAL | Status: DC | PRN

## 2014-12-15 MED ORDER — HYDROCODONE-ACETAMINOPHEN 5-325 MG PO TABS
2.0000 | ORAL_TABLET | Freq: Once | ORAL | Status: AC
  Administered 2014-12-15: 2 via ORAL
  Filled 2014-12-15: qty 2

## 2014-12-15 MED ORDER — DIAZEPAM 5 MG PO TABS
5.0000 mg | ORAL_TABLET | Freq: Once | ORAL | Status: AC
  Administered 2014-12-15: 5 mg via ORAL
  Filled 2014-12-15: qty 1

## 2014-12-15 MED ORDER — IBUPROFEN 800 MG PO TABS: 800.0000 mg | ORAL_TABLET | Freq: Three times a day (TID) | ORAL | Status: DC

## 2014-12-15 NOTE — Discharge Instructions (Signed)
Cervical Strain and Sprain (Whiplash) with Rehab Cervical strain and sprain are injuries that commonly occur with "whiplash" injuries. Whiplash occurs when the neck is forcefully whipped backward or forward, such as during a motor vehicle accident or during contact sports. The muscles, ligaments, tendons, discs, and nerves of the neck are susceptible to injury when this occurs. RISK FACTORS Risk of having a whiplash injury increases if:  Osteoarthritis of the spine.  Situations that make head or neck accidents or trauma more likely.  High-risk sports (football, rugby, wrestling, hockey, auto racing, gymnastics, diving, contact karate, or boxing).  Poor strength and flexibility of the neck.  Previous neck injury.  Poor tackling technique.  Improperly fitted or padded equipment. SYMPTOMS   Pain or stiffness in the front or back of neck or both.  Symptoms may present immediately or up to 24 hours after injury.  Dizziness, headache, nausea, and vomiting.  Muscle spasm with soreness and stiffness in the neck.  Tenderness and swelling at the injury site. PREVENTION  Learn and use proper technique (avoid tackling with the head, spearing, and head-butting; use proper falling techniques to avoid landing on the head).  Warm up and stretch properly before activity.  Maintain physical fitness:  Strength, flexibility, and endurance.  Cardiovascular fitness.  Wear properly fitted and padded protective equipment, such as padded soft collars, for participation in contact sports. PROGNOSIS  Recovery from cervical strain and sprain injuries is dependent on the extent of the injury. These injuries are usually curable in 1 week to 3 months with appropriate treatment.  RELATED COMPLICATIONS   Temporary numbness and weakness may occur if the nerve roots are damaged, and this may persist until the nerve has completely healed.  Chronic pain due to frequent recurrence of  symptoms.  Prolonged healing, especially if activity is resumed too soon (before complete recovery). TREATMENT  Treatment initially involves the use of ice and medication to help reduce pain and inflammation. It is also important to perform strengthening and stretching exercises and modify activities that worsen symptoms so the injury does not get worse. These exercises may be performed at home or with a therapist. For patients who experience severe symptoms, a soft, padded collar may be recommended to be worn around the neck.  Improving your posture may help reduce symptoms. Posture improvement includes pulling your chin and abdomen in while sitting or standing. If you are sitting, sit in a firm chair with your buttocks against the back of the chair. While sleeping, try replacing your pillow with a small towel rolled to 2 inches in diameter, or use a cervical pillow or soft cervical collar. Poor sleeping positions delay healing.  For patients with nerve root damage, which causes numbness or weakness, the use of a cervical traction apparatus may be recommended. Surgery is rarely necessary for these injuries. However, cervical strain and sprains that are present at birth (congenital) may require surgery. MEDICATION   If pain medication is necessary, nonsteroidal anti-inflammatory medications, such as aspirin and ibuprofen, or other minor pain relievers, such as acetaminophen, are often recommended.  Do not take pain medication for 7 days before surgery.  Prescription pain relievers may be given if deemed necessary by your caregiver. Use only as directed and only as much as you need. HEAT AND COLD:   Cold treatment (icing) relieves pain and reduces inflammation. Cold treatment should be applied for 10 to 15 minutes every 2 to 3 hours for inflammation and pain and immediately after any activity that aggravates  your symptoms. Use ice packs or an ice massage.  Heat treatment may be used prior to  performing the stretching and strengthening activities prescribed by your caregiver, physical therapist, or athletic trainer. Use a heat pack or a warm soak. SEEK MEDICAL CARE IF:   Symptoms get worse or do not improve in 2 weeks despite treatment.  New, unexplained symptoms develop (drugs used in treatment may produce side effects). EXERCISES RANGE OF MOTION (ROM) AND STRETCHING EXERCISES - Cervical Strain and Sprain These exercises may help you when beginning to rehabilitate your injury. In order to successfully resolve your symptoms, you must improve your posture. These exercises are designed to help reduce the forward-head and rounded-shoulder posture which contributes to this condition. Your symptoms may resolve with or without further involvement from your physician, physical therapist or athletic trainer. While completing these exercises, remember:   Restoring tissue flexibility helps normal motion to return to the joints. This allows healthier, less painful movement and activity.  An effective stretch should be held for at least 20 seconds, although you may need to begin with shorter hold times for comfort.  A stretch should never be painful. You should only feel a gentle lengthening or release in the stretched tissue. STRETCH- Axial Extensors  Lie on your back on the floor. You may bend your knees for comfort. Place a rolled-up hand towel or dish towel, about 2 inches in diameter, under the part of your head that makes contact with the floor.  Gently tuck your chin, as if trying to make a "double chin," until you feel a gentle stretch at the base of your head.  Hold __________ seconds. Repeat __________ times. Complete this exercise __________ times per day.  STRETCH - Axial Extension   Stand or sit on a firm surface. Assume a good posture: chest up, shoulders drawn back, abdominal muscles slightly tense, knees unlocked (if standing) and feet hip width apart.  Slowly retract your  chin so your head slides back and your chin slightly lowers. Continue to look straight ahead.  You should feel a gentle stretch in the back of your head. Be certain not to feel an aggressive stretch since this can cause headaches later.  Hold for __________ seconds. Repeat __________ times. Complete this exercise __________ times per day. STRETCH - Cervical Side Bend   Stand or sit on a firm surface. Assume a good posture: chest up, shoulders drawn back, abdominal muscles slightly tense, knees unlocked (if standing) and feet hip width apart.  Without letting your nose or shoulders move, slowly tip your right / left ear to your shoulder until your feel a gentle stretch in the muscles on the opposite side of your neck.  Hold __________ seconds. Repeat __________ times. Complete this exercise __________ times per day. STRETCH - Cervical Rotators   Stand or sit on a firm surface. Assume a good posture: chest up, shoulders drawn back, abdominal muscles slightly tense, knees unlocked (if standing) and feet hip width apart.  Keeping your eyes level with the ground, slowly turn your head until you feel a gentle stretch along the back and opposite side of your neck.  Hold __________ seconds. Repeat __________ times. Complete this exercise __________ times per day. RANGE OF MOTION - Neck Circles   Stand or sit on a firm surface. Assume a good posture: chest up, shoulders drawn back, abdominal muscles slightly tense, knees unlocked (if standing) and feet hip width apart.  Gently roll your head down and around from the  back of one shoulder to the back of the other. The motion should never be forced or painful.  Repeat the motion 10-20 times, or until you feel the neck muscles relax and loosen. Repeat __________ times. Complete the exercise __________ times per day. STRENGTHENING EXERCISES - Cervical Strain and Sprain These exercises may help you when beginning to rehabilitate your injury. They may  resolve your symptoms with or without further involvement from your physician, physical therapist, or athletic trainer. While completing these exercises, remember:   Muscles can gain both the endurance and the strength needed for everyday activities through controlled exercises.  Complete these exercises as instructed by your physician, physical therapist, or athletic trainer. Progress the resistance and repetitions only as guided.  You may experience muscle soreness or fatigue, but the pain or discomfort you are trying to eliminate should never worsen during these exercises. If this pain does worsen, stop and make certain you are following the directions exactly. If the pain is still present after adjustments, discontinue the exercise until you can discuss the trouble with your clinician. STRENGTH - Cervical Flexors, Isometric  Face a wall, standing about 6 inches away. Place a small pillow, a ball about 6-8 inches in diameter, or a folded towel between your forehead and the wall.  Slightly tuck your chin and gently push your forehead into the soft object. Push only with mild to moderate intensity, building up tension gradually. Keep your jaw and forehead relaxed.  Hold 10 to 20 seconds. Keep your breathing relaxed.  Release the tension slowly. Relax your neck muscles completely before you start the next repetition. Repeat __________ times. Complete this exercise __________ times per day. STRENGTH- Cervical Lateral Flexors, Isometric   Stand about 6 inches away from a wall. Place a small pillow, a ball about 6-8 inches in diameter, or a folded towel between the side of your head and the wall.  Slightly tuck your chin and gently tilt your head into the soft object. Push only with mild to moderate intensity, building up tension gradually. Keep your jaw and forehead relaxed.  Hold 10 to 20 seconds. Keep your breathing relaxed.  Release the tension slowly. Relax your neck muscles completely  before you start the next repetition. Repeat __________ times. Complete this exercise __________ times per day. STRENGTH - Cervical Extensors, Isometric   Stand about 6 inches away from a wall. Place a small pillow, a ball about 6-8 inches in diameter, or a folded towel between the back of your head and the wall.  Slightly tuck your chin and gently tilt your head back into the soft object. Push only with mild to moderate intensity, building up tension gradually. Keep your jaw and forehead relaxed.  Hold 10 to 20 seconds. Keep your breathing relaxed.  Release the tension slowly. Relax your neck muscles completely before you start the next repetition. Repeat __________ times. Complete this exercise __________ times per day. POSTURE AND BODY MECHANICS CONSIDERATIONS - Cervical Strain and Sprain Keeping correct posture when sitting, standing or completing your activities will reduce the stress put on different body tissues, allowing injured tissues a chance to heal and limiting painful experiences. The following are general guidelines for improved posture. Your physician or physical therapist will provide you with any instructions specific to your needs. While reading these guidelines, remember:  The exercises prescribed by your provider will help you have the flexibility and strength to maintain correct postures.  The correct posture provides the optimal environment for your joints to  work. All of your joints have less wear and tear when properly supported by a spine with good posture. This means you will experience a healthier, less painful body.  Correct posture must be practiced with all of your activities, especially prolonged sitting and standing. Correct posture is as important when doing repetitive low-stress activities (typing) as it is when doing a single heavy-load activity (lifting). PROLONGED STANDING WHILE SLIGHTLY LEANING FORWARD When completing a task that requires you to lean  forward while standing in one place for a long time, place either foot up on a stationary 2- to 4-inch high object to help maintain the best posture. When both feet are on the ground, the low back tends to lose its slight inward curve. If this curve flattens (or becomes too large), then the back and your other joints will experience too much stress, fatigue more quickly, and can cause pain.  RESTING POSITIONS Consider which positions are most painful for you when choosing a resting position. If you have pain with flexion-based activities (sitting, bending, stooping, squatting), choose a position that allows you to rest in a less flexed posture. You would want to avoid curling into a fetal position on your side. If your pain worsens with extension-based activities (prolonged standing, working overhead), avoid resting in an extended position such as sleeping on your stomach. Most people will find more comfort when they rest with their spine in a more neutral position, neither too rounded nor too arched. Lying on a non-sagging bed on your side with a pillow between your knees, or on your back with a pillow under your knees will often provide some relief. Keep in mind, being in any one position for a prolonged period of time, no matter how correct your posture, can still lead to stiffness. WALKING Walk with an upright posture. Your ears, shoulders, and hips should all line up. OFFICE WORK When working at a desk, create an environment that supports good, upright posture. Without extra support, muscles fatigue and lead to excessive strain on joints and other tissues. CHAIR:  A chair should be able to slide under your desk when your back makes contact with the back of the chair. This allows you to work closely.  The chair's height should allow your eyes to be level with the upper part of your monitor and your hands to be slightly lower than your elbows.  Body position:  Your feet should make contact with the  floor. If this is not possible, use a foot rest.  Keep your ears over your shoulders. This will reduce stress on your neck and low back. Document Released: 08/01/2005 Document Revised: 12/16/2013 Document Reviewed: 11/13/2008 [facility] Patient Information 2015 [place], [place]. This information is not intended to replace advice given to you by your health care provider. Make sure you discuss any questions you have with your health care provider.  Torticollis, Acute You have suddenly (acutely) developed a twisted neck (torticollis). This is usually a self-limited condition. CAUSES  Acute torticollis may be caused by malposition, trauma or infection. Most commonly, acute torticollis is caused by sleeping in an awkward position. Torticollis may also be caused by the flexion, extension or twisting of the neck muscles beyond their normal position. Sometimes, the exact cause may not be known. SYMPTOMS  Usually, there is pain and limited movement of the neck. Your neck may twist to one side. DIAGNOSIS  The diagnosis is often made by physical examination. X-rays, CT scans or MRIs may be done if there  is a history of trauma or concern of infection. TREATMENT  For a common, stiff neck that develops during sleep, treatment is focused on relaxing the contracted neck muscle. Medications (including shots) may be used to treat the problem. Most cases resolve in several days. Torticollis usually responds to conservative physical therapy. If left untreated, the shortened and spastic neck muscle can cause deformities in the face and neck. Rarely, surgery is required. HOME CARE INSTRUCTIONS   Use over-the-counter and prescription medications as directed by your caregiver.  Do stretching exercises and massage the neck as directed by your caregiver.  Follow up with physical therapy if needed and as directed by your caregiver. SEEK IMMEDIATE MEDICAL CARE IF:   You develop difficulty breathing or noisy breathing  (stridor).  You drool, develop trouble swallowing or have pain with swallowing.  You develop numbness or weakness in the hands or feet.  You have changes in speech or vision.  You have problems with urination or bowel movements.  You have difficulty walking.  You have a fever.  You have increased pain. MAKE SURE YOU:   Understand these instructions.  Will watch your condition.  Will get help right away if you are not doing well or get worse. Document Released: 07/29/2000 Document Revised: 10/24/2011 Document Reviewed: 09/09/2009 [facility] Patient Information 2015 [place], [place]. This information is not intended to replace advice given to you by your health care provider. Make sure you discuss any questions you have with your health care provider.

## 2014-12-15 NOTE — ED Provider Notes (Signed)
CSN: [id number]     Arrival date & time 12/15/14  1246 History  This chart was scribed for non-physician practitioner, Dahlia Bailiff, PA-C,working with Charlesetta Shanks, MD, by Marlowe Kays, ED Scribe. This patient was seen in room WTR8/WTR8 and the patient's care was started at 1:27 PM.  Chief Complaint  Patient presents with  . Headache  . Neck Injury   Patient is a 54 y.o. female presenting with headaches and neck injury. The history is provided by the patient and medical records. No language interpreter was used.  Headache Associated symptoms: neck pain   Associated symptoms: no fever, no nausea, no numbness, no vomiting and no weakness   Neck Injury Associated symptoms include headaches.    HPI Comments:  Roberta Clark is a 54 y.o. female with PMHx of chronic pain who presents to the Emergency Department complaining of severe, shooting neck pain with the right-side being worse, that began yesterday. Pt states it has been gradually worsening since onset yesterday afternoon. She reports the pain radiates up throughout her head. She has not done anything to treat the pain. Movement of her neck makes the pain worse. She denies any alleviating factors. She denies any change in sleeping habits, although the visitor in the room interjects stating she fell asleep on the cough last night. Denies fever, chills, new numbness, tingling or weakness of the upper extremities, blurred vision, nausea, vomiting, wounds, bruising, trauma, injury or fall. Denies heavy lifting, recent illness or strenuous activity. PMHx of HTN, HLD and depression.  Past Medical History  Diagnosis Date  . Hypertension   . Hyperlipidemia   . Depression   . Chronic pain    History reviewed. No pertinent past surgical history. Family History  Problem Relation Age of Onset  . Heart disease Mother   . Hypertension Mother   . Diabetes Mother   . Diabetes Sister   . Graves' disease Sister   . Lupus Father   . Graves' disease  Brother    History  Substance Use Topics  . Smoking status: Current Every Day Smoker -- 0.05 packs/day for 10 years    Types: Cigarettes  . Smokeless tobacco: Never Used     Comment: 3 cigs per day  . Alcohol Use: No   OB History    Gravida Para Term Preterm AB TAB SAB Ectopic Multiple Living   2 2 2  0 0 0 0 0 0 2     Review of Systems  Constitutional: Negative for fever and chills.  Eyes: Negative for visual disturbance.  Gastrointestinal: Negative for nausea and vomiting.  Musculoskeletal: Positive for neck pain.  Skin: Negative for color change and wound.  Neurological: Positive for headaches. Negative for weakness and numbness.    Allergies  Lipitor  Home Medications   Prior to Admission medications   Medication Sig Start Date End Date Taking? Authorizing Provider  amitriptyline (ELAVIL) 25 MG tablet Take 25 mg by mouth at bedtime as needed for sleep (sleep).    Yes Historical Provider, MD  FLUoxetine (PROZAC) 20 MG capsule Take 40 mg by mouth daily.   Yes Historical Provider, MD  gabapentin (NEURONTIN) 300 MG capsule Take 2 capsules (600 mg total) by mouth 3 (three) times daily. Patient taking differently: Take 600 mg by mouth 4 (four) times daily.  12/18/13  Yes Penni Bombard, MD  gemfibrozil (LOPID) 600 MG tablet Take 600 mg by mouth 2 (two) times daily before a meal.   Yes Historical Provider, MD  lisinopril-hydrochlorothiazide (PRINZIDE,ZESTORETIC) 20-12.5 MG per tablet Take 1 tablet by mouth daily.   Yes Historical Provider, MD  naproxen sodium (ANAPROX) 220 MG tablet Take 660 mg by mouth 2 (two) times daily as needed (pain).    Yes Historical Provider, MD  potassium chloride (K-DUR,KLOR-CON) 10 MEQ tablet Take 10 mEq by mouth daily.   Yes Historical Provider, MD  cephALEXin (KEFLEX) 500 MG capsule Take 1 capsule (500 mg total) by mouth 3 (three) times daily. Patient not taking: Reported on 12/15/2014 10/20/14   Olegario Messier, NP  cephALEXin (KEFLEX) 500 MG  capsule Take 1 capsule (500 mg total) by mouth 4 (four) times daily. Patient not taking: Reported on 12/15/2014 11/24/14   Mora Bellman, MD  cyclobenzaprine (FLEXERIL) 10 MG tablet Take 1 tablet (10 mg total) by mouth 2 (two) times daily as needed for muscle spasms. 12/15/14   Dahlia Bailiff, PA-C  ibuprofen (ADVIL,MOTRIN) 800 MG tablet Take 1 tablet (800 mg total) by mouth 3 (three) times daily. 12/15/14   Dahlia Bailiff, PA-C  meloxicam (MOBIC) 15 MG tablet TAKE 1 TABLET BY MOUTH DAILY. Patient not taking: Reported on 12/15/2014 05/20/13   Marin Olp, MD  metroNIDAZOLE (FLAGYL) 500 MG tablet Take 1 tablet (500 mg total) by mouth 2 (two) times daily. Patient not taking: Reported on 12/15/2014 10/21/14   Connye Burkitt Barefoot, NP   BP 138/86 mmHg  Pulse 96  Temp(Src) 98.6 F (37 C) (Oral)  Resp 20  SpO2 99% Physical Exam  Constitutional: She is oriented to person, place, and time. She appears well-developed and well-nourished.  HENT:  Head: Normocephalic and atraumatic.  Eyes: EOM are normal.  Neck: Normal range of motion and full passive range of motion without pain. Neck supple. No spinous process tenderness and no muscular tenderness present. No rigidity. No edema, no erythema and normal range of motion present. No Brudzinski's sign and no Kernig's sign noted.    Cardiovascular: Normal rate.   Pulmonary/Chest: Effort normal.  Musculoskeletal: Normal range of motion. She exhibits tenderness.  Tenderness with palpation and ROM to lateral right-sided posterior neck in the trapezius muscle.  Neurological: She is alert and oriented to person, place, and time. She has normal strength. No cranial nerve deficit or sensory deficit. She displays a negative Romberg sign. Coordination and gait normal. GCS eye subscore is 4. GCS verbal subscore is 5. GCS motor subscore is 6.  Patient fully alert, answering questions appropriately in full, clear sentences. Cranial nerves II through XII grossly intact. Motor strength 5  out of 5 in all major muscle groups of upper and lower extremities. Distal sensation intact.  Skin: Skin is warm and dry.  Psychiatric: She has a normal mood and affect. Her behavior is normal.  Nursing note and vitals reviewed.   ED Course  Procedures (including critical care time) DIAGNOSTIC STUDIES: Oxygen Saturation is 99% on RA, normal by my interpretation.   COORDINATION OF CARE: 1:36 PM- Will X-Ray C-Spine and order pain meds prior to X-Ray. Instructed patient to perform simple ROM exercises of her neck as tolerated and use heating pad intermittently. Will prescribe Motrin and muscle relaxer. Instructed patient to follow up with PCP for continued symptoms. Pt verbalizes understanding and agrees to plan.  Medications  diazepam (VALIUM) tablet 5 mg (5 mg Oral Given 12/15/14 1437)  HYDROcodone-acetaminophen (NORCO/VICODIN) 5-325 MG per tablet 2 tablet (2 tablets Oral Given 12/15/14 1437)    Labs Review Labs Reviewed - No data to display  Imaging Review Dg  Cervical Spine Complete  12/15/2014   CLINICAL DATA:  The patient woke up with a stiff neck this morning. No known injury. Initial encounter.  EXAM: CERVICAL SPINE  4+ VIEWS  COMPARISON:  None.  FINDINGS: Vertebral body height and alignment are normal. Intervertebral disc space height is maintained. Scattered, mild appearing facet degenerative disease is noted. Prevertebral soft tissues appear normal.  IMPRESSION: No acute abnormality.  Scattered, mild appearing facet degenerative change.   Electronically Signed   By: Inge Rise M.D.   On: 12/15/2014 14:37     EKG Interpretation None      MDM   Final diagnoses:  Neck pain on left side  Torticollis    Radiographs obtained and do not have evidence of acute pathology. Patient's neck pain most likely musculoskeletal strain. On reexamination, after medication patient has full range of motion of her neck, states she is feeling much better. Patient also states that she had a  small "crick in her neck" yesterday, attempted to stretch it, by "popping her neck". She states immediately after she had the pain she is experiencing now. This makes more likely a musculoskeletal strain. There are no meningeal signs, no concern for meningitis. Neurologic exam is benign. No concern for cervical radiculopathy. Patient be discharged, strongly encouraged to follow-up with her primary care doctor. Return precautions discussed, patient verbalizes understanding and agreement of plan. Encouraged patient to call or return to ER if any worsening symptoms or should she have any questions or concerns.  I personally performed the services described in this documentation, which was scribed in my presence. The recorded information has been reviewed and is accurate.  BP 156/95 mmHg  Pulse 87  Temp(Src) 98.6 F (37 C) (Oral)  Resp 18  SpO2 99%  Signed,  Dahlia Bailiff, PA-C 10:19 PM   Dahlia Bailiff, PA-C 12/15/14 2219  Charlesetta Shanks, MD 12/18/14 [phone]

## 2014-12-15 NOTE — ED Notes (Signed)
Pt complains of posterior head and neck pain. Pt states it hurts to move her neck. Denies nausea, vomiting, fever, chills. States it started yesterday after leaving church, denies any injury to area. States it hurts more when touched. No obvious deformity or swelling.

## 2015-01-14 NOTE — Telephone Encounter (Signed)
Done

## 2015-04-02 ENCOUNTER — Other Ambulatory Visit (HOSPITAL_COMMUNITY): Payer: Self-pay | Admitting: Anesthesiology

## 2015-04-02 DIAGNOSIS — Z1231 Encounter for screening mammogram for malignant neoplasm of breast: Secondary | ICD-10-CM

## 2015-04-17 ENCOUNTER — Other Ambulatory Visit (HOSPITAL_COMMUNITY): Payer: Self-pay | Admitting: Primary Care

## 2015-04-17 DIAGNOSIS — Z1231 Encounter for screening mammogram for malignant neoplasm of breast: Secondary | ICD-10-CM

## 2015-04-23 ENCOUNTER — Ambulatory Visit (HOSPITAL_COMMUNITY): Payer: No Typology Code available for payment source

## 2015-04-29 ENCOUNTER — Ambulatory Visit (HOSPITAL_COMMUNITY): Payer: No Typology Code available for payment source

## 2015-05-01 ENCOUNTER — Ambulatory Visit (HOSPITAL_COMMUNITY): Payer: No Typology Code available for payment source | Attending: Primary Care

## 2015-05-08 ENCOUNTER — Ambulatory Visit (HOSPITAL_COMMUNITY)
Admission: RE | Admit: 2015-05-08 | Discharge: 2015-05-08 | Disposition: A | Discharge status: Home / Self Care | Payer: No Typology Code available for payment source | Source: Ambulatory Visit | Attending: Primary Care | Admitting: Primary Care

## 2015-05-08 DIAGNOSIS — Z1231 Encounter for screening mammogram for malignant neoplasm of breast: Secondary | ICD-10-CM | POA: Insufficient documentation

## 2015-09-14 MED FILL — IBUPROFEN 800 MG TABLET: 800 | 30 days supply | Qty: 90 | Fill #2

## 2015-09-14 MED FILL — GABAPENTIN 600 MG TABLET: 600 | 30 days supply | Qty: 120 | Fill #3

## 2015-09-15 ENCOUNTER — Ambulatory Visit: Payer: Self-pay | Admitting: Internal Medicine

## 2015-10-05 ENCOUNTER — Ambulatory Visit (INDEPENDENT_AMBULATORY_CARE_PROVIDER_SITE_OTHER): Payer: Self-pay | Admitting: Internal Medicine

## 2015-10-05 ENCOUNTER — Encounter: Payer: Self-pay | Admitting: Internal Medicine

## 2015-10-05 VITALS — BP 122/84 | HR 74 | Ht 63.0 in | Wt 111.0 lb

## 2015-10-05 DIAGNOSIS — G4701 Insomnia due to medical condition: Secondary | ICD-10-CM

## 2015-10-05 DIAGNOSIS — G8929 Other chronic pain: Secondary | ICD-10-CM

## 2015-10-05 DIAGNOSIS — F32A Depression, unspecified: Secondary | ICD-10-CM

## 2015-10-05 DIAGNOSIS — M5442 Lumbago with sciatica, left side: Secondary | ICD-10-CM

## 2015-10-05 DIAGNOSIS — F329 Major depressive disorder, single episode, unspecified: Secondary | ICD-10-CM

## 2015-10-05 DIAGNOSIS — E785 Hyperlipidemia, unspecified: Secondary | ICD-10-CM

## 2015-10-05 MED ORDER — GABAPENTIN 600 MG PO TABS: ORAL_TABLET | ORAL | Status: DC

## 2015-10-05 MED ORDER — CYCLOBENZAPRINE HCL 10 MG PO TABS
ORAL_TABLET | ORAL | Status: DC
Start: 2015-10-05 — End: 2015-11-09

## 2015-10-05 MED ORDER — AMITRIPTYLINE HCL 25 MG PO TABS: 25.0000 mg | ORAL_TABLET | Freq: Every evening | ORAL | Status: DC | PRN

## 2015-10-05 MED ORDER — IBUPROFEN 800 MG PO TABS: 800.0000 mg | ORAL_TABLET | Freq: Two times a day (BID) | ORAL | Status: DC

## 2015-10-05 MED ORDER — FLUOXETINE HCL 20 MG PO CAPS: 20.0000 mg | ORAL_CAPSULE | Freq: Every day | ORAL | Status: DC

## 2015-10-05 MED ORDER — POTASSIUM CHLORIDE CRYS ER 10 MEQ PO TBCR: EXTENDED_RELEASE_TABLET | ORAL | Status: DC

## 2015-10-05 MED ORDER — GEMFIBROZIL 600 MG PO TABS
ORAL_TABLET | ORAL | Status: DC
Start: 2015-10-05 — End: 2016-01-05

## 2015-10-05 NOTE — Progress Notes (Signed)
Subjective:    Patient ID: Roberta Clark, female    DOB: July 19, 1961, 55 y.o.   MRN: [id number]  HPI   1.  Lumbar stenosis with left LE radiculopathy:  States diagnosed 3 years ago.  Pain for about 8-9 years.  Maybe 20-30 years ago, fell down some stairs, but was evaluated and no distinct injury found.   Was diagnosed at [facility].  Was referred to [facility]--apparently her phycians all moved to [place] at one point, so she has charts at both places.   Did have PT, which did not help.  Ultimately, received unknown type injections and that helped.  Cannot afford the $90 injections any longer.  States was receiving every 30 days.  Then received Oxycodone, but cannot get over there every month to pick up Rx.  Last Rx for Oxycodone was in July of 2016. Is also taking Fluoxetine, Cyclobenzaprine, gabapentin. Does not use ibuprofen if taking Meloxicam.   Takes Naproxen at bedtime to sleep, but is also on Amitriptyline, Cyclobenzaprine and Gabapentin at bedtime. Does have the orange card and would like to switch to the [facility] pharmacy due to cost of some of meds.  Using [facility] outpatient pharmacy currently.   2. Essential Hypertension:  Diagnosed 2 years ago.  Takes Lisinopril/HCTZ and has no problem with it.     3.  Hyperlipidemia:  Also diagnosed about 2 years ago.  Does okay on Gemfibrozil.  Gets at [facility].  NIDDM:  Stopped Metformin as she had abdominal discomfort and nausea with it.  Took for a couple of months--stopped October 2016.  She is unaware of what her A1C was at [facility].  Did not follow up after stopping.    Current outpatient prescriptions:  .  amitriptyline (ELAVIL) 25 MG tablet, Take 1 tablet (25 mg total) by mouth at bedtime as needed for sleep (sleep)., Disp: 30 tablet, Rfl: 11 .  cyclobenzaprine (FLEXERIL) 10 MG tablet, 1 tab by mouth daily at night only as needed., Disp: 20 tablet, Rfl: 0 .  FLUoxetine (PROZAC) 20 MG capsule, Take 1 capsule (20 mg total) by  mouth daily., Disp: 30 capsule, Rfl: 11 .  gabapentin (NEURONTIN) 600 MG tablet, Take one tablet in the am, one tablet at lunch and two tablets at bedtime, Disp: 120 tablet, Rfl: 11 .  gemfibrozil (LOPID) 600 MG tablet, 1 tab by mouth twice daily with meals, Disp: 60 tablet, Rfl: 11 .  ibuprofen (ADVIL,MOTRIN) 800 MG tablet, Take 1 tablet (800 mg total) by mouth 2 (two) times daily., Disp: 60 tablet, Rfl: 6 .  lisinopril-hydrochlorothiazide (PRINZIDE,ZESTORETIC) 20-12.5 MG per tablet, Take 1 tablet by mouth daily., Disp: , Rfl:  .  oxyCODONE-acetaminophen (PERCOCET/ROXICET) 5-325 MG tablet, Take 1 tablet by mouth every 8 (eight) hours as needed for severe pain., Disp: , Rfl:  .  potassium chloride (K-DUR,KLOR-CON) 10 MEQ tablet, 1 tab by mouth daily, Disp: 30 tablet, Rfl: 11   Allergies:  Atorvastatin    Review of Systems     Objective:   Physical Exam HEENT:  PERRL, EOMI, TMs pearly gray, throat without injection,  Neck:  Supple, no adenopathy Chest:  CTA CV:  RRR with normal S1 and S2, No S3, S4 or murmur appreciated, radial and DP pulses normal and equal.  No LE edema Neuro:  A & O x3, CN II-XII grossly intact, DTRs 2+4 throughout.  Motor decreased in left leg as pt. States too painful.  MS:   +straight leg  raise on left--not clear regarding effort.       Assessment & Plan:  1.  Chronic Pain:  Need to assess her old records regarding pain.  Has not had a prescription for oxycodone since July of last year. (though later, found out she was still utilizing a 3 month prescription)  Would like her to get into a pool and walk in 3 feet. (does not know how to swim.) Ibuprofen works best of the NSAIDS. Have asked her to stop the Meloxicam and Naproxen.  Addendum:  Looking at [facility], pt was given Oxycodone/[facility] 5/325 mg tid with refills through 06/20/2015, which would have taken her through December. She was last seen in October there.    2.  Insomnia:  Secondary to pain per  patient.  Would like her to work with this with Metta Clines, LCSW. Very concerned regarding all of the pain and sleep aides she is using (Benadryl with Naproxen sleep, Amitriptyline, high dose Gabapentin, and Cyclobenzaprine at night)  Unable to get into this at length today, but suspect she has a poor sleep hygiene.  3.  Essential Hypertension:  Controlled.  CMP  4.  Diabetes Mellitus:  Pt. Does not really believe she has this.  Check A1C  5.  Hyperlipidemia:  Nonfasting today:  Will have her come back for labs later in week fasting:  FLP then.

## 2015-10-05 NOTE — Patient Instructions (Signed)
Release of information from [facility]--including labs Sign an extra release, in case I cannot get info from [facility] from [place], pain clinic.

## 2015-10-06 ENCOUNTER — Other Ambulatory Visit (INDEPENDENT_AMBULATORY_CARE_PROVIDER_SITE_OTHER): Payer: Self-pay

## 2015-10-06 DIAGNOSIS — I10 Essential (primary) hypertension: Secondary | ICD-10-CM

## 2015-10-06 DIAGNOSIS — E785 Hyperlipidemia, unspecified: Secondary | ICD-10-CM

## 2015-10-06 DIAGNOSIS — E119 Type 2 diabetes mellitus without complications: Secondary | ICD-10-CM

## 2015-10-07 LAB — COMPREHENSIVE METABOLIC PANEL
A/G RATIO: 1.4 (ref 1.1–2.5)
ALBUMIN: 4.2 g/dL (ref 3.5–5.5)
ALK PHOS: 66 IU/L (ref 39–117)
ALT: 14 IU/L (ref 0–32)
AST: 18 IU/L (ref 0–40)
BUN/Creatinine Ratio: 25 — ABNORMAL HIGH (ref 9–23)
BUN: 17 mg/dL (ref 6–24)
Bilirubin Total: <0.2 mg/dL (ref 0.0–1.2)
CO2: 25 mmol/L (ref 18–29)
Calcium: 10.2 mg/dL (ref 8.7–10.2)
Chloride: 103 mmol/L (ref 96–106)
Creatinine, Ser: 0.69 mg/dL (ref 0.57–1.00)
GFR calc non Af Amer: 98 mL/min/{1.73_m2} (ref >59)
GFR, EST AFRICAN AMERICAN: 113 mL/min/{1.73_m2} (ref >59)
Globulin, Total: 3 g/dL (ref 1.5–4.5)
Glucose: 123 mg/dL — ABNORMAL HIGH (ref 65–99)
POTASSIUM: 4.7 mmol/L (ref 3.5–5.2)
Sodium: 145 mmol/L — ABNORMAL HIGH (ref 134–144)
TOTAL PROTEIN: 7.2 g/dL (ref 6.0–8.5)

## 2015-10-07 LAB — LIPID PANEL W/O CHOL/HDL RATIO
Cholesterol, Total: 261 mg/dL — ABNORMAL HIGH (ref 100–199)
HDL: 42 mg/dL (ref >39)
LDL Calculated: 176 mg/dL — ABNORMAL HIGH (ref 0–99)
Triglycerides: 217 mg/dL — ABNORMAL HIGH (ref 0–149)
VLDL Cholesterol Cal: 43 mg/dL — ABNORMAL HIGH (ref 5–40)

## 2015-10-07 LAB — HGB A1C W/O EAG: Hgb A1c MFr Bld: 6.5 % — ABNORMAL HIGH (ref 4.8–5.6)

## 2015-10-10 ENCOUNTER — Encounter: Payer: Self-pay | Admitting: Internal Medicine

## 2015-11-03 ENCOUNTER — Ambulatory Visit (INDEPENDENT_AMBULATORY_CARE_PROVIDER_SITE_OTHER): Payer: Self-pay | Admitting: Internal Medicine

## 2015-11-03 ENCOUNTER — Encounter: Payer: Self-pay | Admitting: Internal Medicine

## 2015-11-03 VITALS — BP 126/80 | HR 80 | Ht 63.0 in | Wt 109.0 lb

## 2015-11-03 DIAGNOSIS — R8290 Unspecified abnormal findings in urine: Secondary | ICD-10-CM

## 2015-11-03 DIAGNOSIS — M5416 Radiculopathy, lumbar region: Secondary | ICD-10-CM

## 2015-11-03 DIAGNOSIS — I10 Essential (primary) hypertension: Secondary | ICD-10-CM

## 2015-11-03 DIAGNOSIS — E119 Type 2 diabetes mellitus without complications: Secondary | ICD-10-CM

## 2015-11-03 DIAGNOSIS — E785 Hyperlipidemia, unspecified: Secondary | ICD-10-CM

## 2015-11-03 LAB — POCT URINALYSIS DIPSTICK
Bilirubin, UA: NEGATIVE
Blood, UA: NEGATIVE
Glucose, UA: NEGATIVE
KETONES UA: NEGATIVE
Nitrite, UA: POSITIVE
PH UA: 7.5
SPEC GRAV UA: 1.01
UROBILINOGEN UA: 0.2

## 2015-11-03 MED ORDER — OXYCODONE-ACETAMINOPHEN 5-325 MG PO TABS: 1.0000 | ORAL_TABLET | Freq: Three times a day (TID) | ORAL | Status: DC | PRN

## 2015-11-03 NOTE — Patient Instructions (Addendum)
[facility] and go with your mother--think water physical activity as much as possible--take swimming lessons!!! Drink a glass of water before every meal Drink 6-8 glasses of water daily Eat three meals daily Eat a protein and healthy fat with every meal (eggs,fish, chicken, [place] and limit red meats) Eat 5 servings of vegetables daily, mix the colors Eat 2 servings of fruit daily with skin, if skin is edible Use smaller plates Put food/utensils down as you chew and swallow each bite Eat at a table with friends/family at least once daily, no TV Do not eat in front of the TV

## 2015-11-03 NOTE — Progress Notes (Signed)
Subjective:    Patient ID: Roberta Clark, female    DOB: 1960/12/31, 55 y.o.   MRN: [id number]  HPI   1.  Chronic Back Pain:  Did not get application to the [facility] done yet.  Pt. Previously followed at Pain center at [facility], her specialist there then moved to the [place], which I believe was [facility].  She received 5 injections,  Each 30 days apart with control of her pain from spinal stenosis.   She is unable to afford the travel to [facility] now, even if could get her financial assistance to go there. Last MRI was 2011.   Pt. Is wanting to know if she should have another MRI.  She is now getting numbness in her right foot now, which she did not have previously. Also, brings up [facility], Dr. Ronnald Ramp.  Was told she could have the surgery there, but needs another MRI.  The surgery would cost $22,000 +. Was told by [facility] she was a candidate for implanted nerve stimulator, but not for surgical intervention of her stenosis.   I am unable to find any records sent yet from the [facility]. Gabapentin now up to 2 tabs in the morning, 2 at lunch and 4 at bedtime. Believe she was switched to 300 mg tabs Does help with the pain and helps with sleep Is having phone counseling with our LCSW, Metta Clines.  This is once weekly.  2.Essential Hypertension: Taking Lisinopril/hctz --needs refills to [facility].  3.  Type 2 DM:  A1C was 6.5%.  Have discussed with patient, she seems to agree she has DM.  Last eye exam was 2011.  Does have blurry vision. Does check feet nightly. Did not get flu vaccine this year. Has not had a pneumonia vaccine she can recall. Records from [facility] do not document a Pneumovax administration. Tdap maybe 3 years ago at [facility].  4.  Hyperlipidemia:  Quite high at 261 with LDL of 176 and HDL at 42.  Was not taking Gemfibrozil regularly before this was checked.  Will recheck in follow up to see if at goal   5.  Strong urine smell--no dysuria or frequency.     Current outpatient prescriptions:  .  amitriptyline (ELAVIL) 25 MG tablet, Take 1 tablet (25 mg total) by mouth at bedtime as needed for sleep (sleep)., Disp: 30 tablet, Rfl: 11 .  cyclobenzaprine (FLEXERIL) 10 MG tablet, 1 tab by mouth daily at night only as needed., Disp: 20 tablet, Rfl: 0 .  FLUoxetine (PROZAC) 20 MG capsule, Take 1 capsule (20 mg total) by mouth daily., Disp: 30 capsule, Rfl: 11 .  gabapentin (NEURONTIN) 600 MG tablet, Take one tablet in the am, one tablet at lunch and two tablets at bedtime, Disp: 120 tablet, Rfl: 11 .  gemfibrozil (LOPID) 600 MG tablet, 1 tab by mouth twice daily with meals, Disp: 60 tablet, Rfl: 11 .  ibuprofen (ADVIL,MOTRIN) 800 MG tablet, Take 1 tablet (800 mg total) by mouth 2 (two) times daily., Disp: 60 tablet, Rfl: 6 .  lisinopril-hydrochlorothiazide (PRINZIDE,ZESTORETIC) 20-12.5 MG per tablet, Take 1 tablet by mouth daily., Disp: , Rfl:  .  potassium chloride (K-DUR,KLOR-CON) 10 MEQ tablet, 1 tab by mouth daily, Disp: 30 tablet, Rfl: 11 .  oxyCODONE-acetaminophen (PERCOCET/ROXICET) 5-325 MG tablet, Take 1 tablet by mouth every 8 (eight) hours as needed for severe pain. Reported on 11/03/2015, Disp: 30 tablet, Rfl: 0  Allergies  Allergen Reactions  . Lipitor [Atorvastatin]  Review of Systems     Objective:   Physical Exam  HEENT:  PERRL, EOMI, discs sharp, throat without injection Neck: Supple, no adenopathy, Chest:  CTA CV:  RRR without murmur or rub, radial pulses normal and equal Abd:  No flank tenderness, +BS, No HSM or masses, NT--no suprapubic tenderness Feet:  Very dry and a bit flaky on plantar aspect;  Normal 10 g monofilament testing, normal peripheral pulses MS:  Tender to right of spinal column, lumbosacral area.  Not clear if true abnormality wit straight leg raise Neuro: Motor 5/5 RLE and 4+/5 LLE.  States pain is limiting her ability to perform adequately.  DTRs 2+/5 throughout.        Assessment & Plan:  1.   Lumbar stenosis with radiculopathy:  Need records from pain center before considering MRI of spine.  Check to see if received.  Wrote for #30 Oxycodone 5 mg/Acetaminophen 325 mg.  Pain contract signed.  To obtain only at [facility]. Check on Gabapentin dose at [facility]. Suspect they only have 300 mg tabs and this explains her different tablet use.  2.  Type 2 DM:  No documentation that has received Pneumovax Recommended obtaining at [facility]. Discussed work on diet and physical activity.  Consider Metformin XR.  Pt. Resistant to treatment.  Has tried plain Metformin with GI upset.    3. Hyperlipidemia:   Work on diet and physical activity. Recheck FLP with next visit as she takes meds regularly  4.   Health Maintenance:    Check on Tdap  5.  STrong urine smell--UA, which was abnormal.  Send for culture.  6.  Continue counseling with Metta Clines, LCSW

## 2015-11-05 LAB — URINE CULTURE

## 2015-11-05 MED ORDER — CIPROFLOXACIN HCL 500 MG PO TABS: ORAL_TABLET | ORAL | Status: DC

## 2015-11-05 MED FILL — OXYCODONE/APAP 5-325: 5-325 | 10 days supply | Qty: 30 | Fill #0

## 2015-11-05 NOTE — Addendum Note (Signed)
Addended by: Marcelino Duster on: 11/05/2015 12:19 PM   Modules accepted: Orders

## 2015-11-09 ENCOUNTER — Telehealth: Payer: Self-pay | Admitting: Internal Medicine

## 2015-11-09 DIAGNOSIS — M5442 Lumbago with sciatica, left side: Secondary | ICD-10-CM

## 2015-11-09 MED ORDER — LISINOPRIL-HYDROCHLOROTHIAZIDE 20-12.5 MG PO TABS: 1.0000 | ORAL_TABLET | Freq: Every day | ORAL | Status: DC

## 2015-11-09 MED ORDER — CYCLOBENZAPRINE HCL 10 MG PO TABS: ORAL_TABLET | ORAL | Status: DC

## 2015-11-09 NOTE — Telephone Encounter (Signed)
Patient called to request refill on Rx lisinopril-hydrochlorothiazide call into [facility] on [address].  Patient also needs Rx cyclobenzaprine and [facility]rittyline [facility]

## 2015-11-09 NOTE — Telephone Encounter (Signed)
Patient needs Rx refill for

## 2015-11-10 ENCOUNTER — Telehealth: Payer: Self-pay | Admitting: Internal Medicine

## 2015-11-10 NOTE — Telephone Encounter (Signed)
Patient would like Rx refill.  Lisinopril 20-12.5 tab to be called into [facility] on [address].  Cyclobenzatrine 10 mg. Tab. Called into [facility].   Patient was told by pharmacy that the Rx was not called in.  Patient phone number [phone].

## 2016-01-04 ENCOUNTER — Telehealth: Payer: Self-pay | Admitting: Internal Medicine

## 2016-01-04 DIAGNOSIS — M5442 Lumbago with sciatica, left side: Secondary | ICD-10-CM

## 2016-01-04 DIAGNOSIS — E785 Hyperlipidemia, unspecified: Secondary | ICD-10-CM

## 2016-01-04 NOTE — Telephone Encounter (Signed)
Patient would like the following Rx's called into [facility] on [address].  She now has Medicaid and can't use the [facility].  Flexeril 10 mg.; NEURONTIN 600 mg.; LOPID 600 mg.; K-DUR,KLOR-CON 10 meg tab.  Patient would also like to stop by and pick up Rx for PERCOCET/ROXICET 5-325 mg.

## 2016-01-04 NOTE — Telephone Encounter (Signed)
Patient called and would like the following Rx sent to [facility] on [address].

## 2016-01-05 MED ORDER — GEMFIBROZIL 600 MG PO TABS: ORAL_TABLET | ORAL | Status: DC

## 2016-01-05 MED ORDER — GABAPENTIN 600 MG PO TABS: ORAL_TABLET | ORAL | Status: DC

## 2016-01-05 MED ORDER — CYCLOBENZAPRINE HCL 10 MG PO TABS
ORAL_TABLET | ORAL | Status: DC
Start: 2016-01-05 — End: 2016-02-19

## 2016-01-05 MED ORDER — POTASSIUM CHLORIDE CRYS ER 10 MEQ PO TBCR: EXTENDED_RELEASE_TABLET | ORAL | Status: DC

## 2016-01-15 NOTE — Telephone Encounter (Signed)
Patient called stating pharmacy had not received Rx. Called pharmacy and Rx was received. Medications will be ready tomorrow after 3pm. Patient informed

## 2016-01-18 ENCOUNTER — Telehealth: Payer: Self-pay | Admitting: Internal Medicine

## 2016-01-18 ENCOUNTER — Other Ambulatory Visit: Payer: Self-pay

## 2016-01-18 NOTE — Telephone Encounter (Signed)
Patient would like refills on the following Rx's: cyclobenzaprine(FLEXERIL) 10 mg. Tab; gemfibrozil (LOPID) 600 mg. Tab; potassium chloride(K-DUR,KLOR-CON) 10 meq tab; gabapentin(NEURONTIN) 600 mg. Tab.

## 2016-01-18 NOTE — Telephone Encounter (Signed)
Left voicemail on both of the patient's phone numbers in the system to call the pharmacy for her refills.

## 2016-01-26 ENCOUNTER — Telehealth: Payer: Self-pay | Admitting: Internal Medicine

## 2016-01-26 ENCOUNTER — Other Ambulatory Visit (INDEPENDENT_AMBULATORY_CARE_PROVIDER_SITE_OTHER): Payer: Self-pay

## 2016-01-26 DIAGNOSIS — E785 Hyperlipidemia, unspecified: Secondary | ICD-10-CM

## 2016-01-26 DIAGNOSIS — M5416 Radiculopathy, lumbar region: Secondary | ICD-10-CM

## 2016-01-26 DIAGNOSIS — E119 Type 2 diabetes mellitus without complications: Secondary | ICD-10-CM

## 2016-01-26 DIAGNOSIS — I10 Essential (primary) hypertension: Secondary | ICD-10-CM

## 2016-01-26 MED ORDER — OXYCODONE-ACETAMINOPHEN 5-325 MG PO TABS: 1.0000 | ORAL_TABLET | Freq: Three times a day (TID) | ORAL | Status: DC | PRN

## 2016-01-26 NOTE — Progress Notes (Signed)
Patient now has medicaid and will call back with recipient ID number this afternoon. She is still awaiting a response from Dr. Amil Amen about the percocet refill.

## 2016-01-26 NOTE — Telephone Encounter (Signed)
Patient informed. To Fraser Din to remind to sign pain contract for patient.

## 2016-01-27 LAB — BASIC METABOLIC PANEL WITH GFR
BUN/Creatinine Ratio: 33 — ABNORMAL HIGH (ref 9–23)
BUN: 31 mg/dL — ABNORMAL HIGH (ref 6–24)
CO2: 24 mmol/L (ref 18–29)
Calcium: 10.1 mg/dL (ref 8.7–10.2)
Chloride: 98 mmol/L (ref 96–106)
Creatinine, Ser: 0.93 mg/dL (ref 0.57–1.00)
GFR calc Af Amer: 79 mL/min/{1.73_m2}
GFR calc non Af Amer: 69 mL/min/{1.73_m2}
Glucose: 106 mg/dL — ABNORMAL HIGH (ref 65–99)
Potassium: 4.1 mmol/L (ref 3.5–5.2)
Sodium: 142 mmol/L (ref 134–144)

## 2016-01-27 LAB — HGB A1C W/O EAG: Hgb A1c MFr Bld: 6.3 % — ABNORMAL HIGH (ref 4.8–5.6)

## 2016-02-02 ENCOUNTER — Encounter: Payer: Self-pay | Admitting: Internal Medicine

## 2016-02-02 ENCOUNTER — Ambulatory Visit (INDEPENDENT_AMBULATORY_CARE_PROVIDER_SITE_OTHER): Payer: Self-pay | Admitting: Internal Medicine

## 2016-02-02 ENCOUNTER — Other Ambulatory Visit: Payer: Self-pay

## 2016-02-02 VITALS — BP 122/68 | HR 82 | Resp 16 | Ht 63.0 in | Wt 116.0 lb

## 2016-02-02 DIAGNOSIS — Z23 Encounter for immunization: Secondary | ICD-10-CM

## 2016-02-02 DIAGNOSIS — E785 Hyperlipidemia, unspecified: Secondary | ICD-10-CM

## 2016-02-02 DIAGNOSIS — F17200 Nicotine dependence, unspecified, uncomplicated: Secondary | ICD-10-CM

## 2016-02-02 DIAGNOSIS — E119 Type 2 diabetes mellitus without complications: Secondary | ICD-10-CM

## 2016-02-02 DIAGNOSIS — M5416 Radiculopathy, lumbar region: Secondary | ICD-10-CM

## 2016-02-02 DIAGNOSIS — I10 Essential (primary) hypertension: Secondary | ICD-10-CM

## 2016-02-02 LAB — GLUCOSE, POCT (MANUAL RESULT ENTRY): POC GLUCOSE: 92 mg/dL (ref 70–99)

## 2016-02-02 MED ORDER — GABAPENTIN 300 MG PO CAPS: 300.0000 mg | ORAL_CAPSULE | Freq: Three times a day (TID) | ORAL | Status: DC

## 2016-02-02 MED FILL — OXYCODONE/APAP 5-325: 5-325 | 10 days supply | Qty: 30 | Fill #0

## 2016-02-02 NOTE — Patient Instructions (Signed)
Drink a glass of water before every meal Drink 6-8 glasses of water daily Eat three meals daily Eat a protein and healthy fat with every meal (eggs,fish, chicken, turkey and limit red meats) Eat 5 servings of vegetables daily, mix the colors Eat 2 servings of fruit daily with skin, if skin is edible Use smaller plates Put food/utensils down as you chew and swallow each bite Eat at a table with friends/family at least once daily, no TV Do not eat in front of the TV 

## 2016-02-02 NOTE — Progress Notes (Signed)
Subjective:    Patient ID: Roberta Clark, female    DOB: Mar 09, 1961, 55 y.o.   MRN: [id number]  HPI   1.  DM Type 2--following up really for first time since diagnosis.  A1C was 6.5% in February:  Drinking a regular soda today.  STates drinks regularly. Breakfast:  Honey Smacks or Cheerios, [employer] Cinnamon Pecan, banana or an apple or orange or may have eggs with [place] bacon and sausage. Lunch:  Spaghetti or steak, veggie and rice or [place] or beef bologna  Dinner:  Similar to lunch.   Also baked chicken, corn, mashed potatoes, fresh greens. Mainly drinks water.  2.  Tobacco Abuse:  Has Nicotine Gum.  Smoking 3 cigarettes daily.  Basically scheduled times or habits rather than stress relief.  3.  Chronic Lumbar Radiculopathy:  Has been stretching her gabapentin as Medicaid does not cover the way her Rx was written--sounds like covers capsules and not pills.  Has not been taking adequately for past 2 weeks. Was taking 600 mg in morning, 600 mg at lunch and 1200 mg at bedtime.  Was sleeping well with the titration up at night.  Has had more pain since stretching Gabapentin.  She would like to follow with a pain clinic locally.   Feels she is having more burning radicular pain in her left leg still.   Heating pad on her leg helps as well. More difficulty with bearing weight due to pain.   4.  Hyperlipidemia:  Discussed diet as well as increasing water physical activity.  Is taking Gemfibrozil regularly, but had not been back in February when FLP checked.   For some reason, we did not check with labs last week.  5.  Essential Hypertension:  Taking Lisinopril-HCTZ   Current outpatient prescriptions:  .  amitriptyline (ELAVIL) 25 MG tablet, Take 1 tablet (25 mg total) by mouth at bedtime as needed for sleep (sleep)., Disp: 30 tablet, Rfl: 11 .  cyclobenzaprine (FLEXERIL) 10 MG tablet, 1 tab by mouth daily at night only as needed., Disp: 20 tablet, Rfl: 0 .  FLUoxetine (PROZAC) 20 MG capsule,  Take 1 capsule (20 mg total) by mouth daily., Disp: 30 capsule, Rfl: 11 .  gemfibrozil (LOPID) 600 MG tablet, 1 tab by mouth twice daily with meals, Disp: 60 tablet, Rfl: 11 .  ibuprofen (ADVIL,MOTRIN) 800 MG tablet, Take 1 tablet (800 mg total) by mouth 2 (two) times daily., Disp: 60 tablet, Rfl: 6 .  lisinopril-hydrochlorothiazide (PRINZIDE,ZESTORETIC) 20-12.5 MG tablet, Take 1 tablet by mouth daily., Disp: 30 tablet, Rfl: 11 .  oxyCODONE-acetaminophen (PERCOCET/ROXICET) 5-325 MG tablet, Take 1 tablet by mouth every 8 (eight) hours as needed for severe pain. Reported on 11/03/2015, Disp: 30 tablet, Rfl: 0 .  potassium chloride (K-DUR,KLOR-CON) 10 MEQ tablet, 1 tab by mouth daily, Disp: 30 tablet, Rfl: 11 .  gabapentin (NEURONTIN) 300 MG capsule, Take 1 capsule (300 mg total) by mouth 3 (three) times daily. Take two capsules in the AM, two at lunch, and 4 at bedtime., Disp: 240 capsule, Rfl: 11   Allergies  Allergen Reactions  . Lipitor [Atorvastatin]      Review of Systems     Objective:   Physical Exam  Limps, favoring left leg to exam table. Lungs:  CTA CV:  RRR with normal S1 and S2, No S3, S4 or murmur.  Radial pulses normal and equal. Abd:  S, NT, No HSM or masses, + BS MS:  Tender over lumbar spinous processes and  paraspinous musculature. Neuro:  Motor strength of Left leg with 4+/5 throughout, but also limited by pain.  5/5 of right LE.  DTRs 2+/4 throughout LE:  No edema.        Assessment & Plan:  1.  DM Type 2:  Improved to A1C of 6.3% from 6.5% with last check on 6/13.  Discussed improving diet at length and physical activity in pool--scholarship for Y given.  2.  Tobacco Abuse:  Encouraged pt. To get rid of smoking paraphernalia in home and to get started on the nicotine gum in place of the smoking.  3.  Chronic Low Back Pain with Radiculopathy:  Pt. Previously responded to injections at [facility] and [facility] in [facility], but cannot afford to travel there any longer.   She does have Medicaid now, so will refer to [facility] with Dr. Loni Dolly see if he performs epidural/facet injections. To restart Gabapentin at 600 mg in morning and midday and 1200 mg at bedtime  4.  Hyperlipidemia:  See if can add FLP to last lab done.  If not, draw at CPE  5.  Essential Hypertension:  controlled

## 2016-02-03 LAB — LIPID PANEL W/O CHOL/HDL RATIO
CHOLESTEROL TOTAL: 264 mg/dL — AB (ref 100–199)
HDL: 43 mg/dL (ref >39)
LDL CALC: 188 mg/dL — AB (ref 0–99)
Triglycerides: 165 mg/dL — ABNORMAL HIGH (ref 0–149)
VLDL Cholesterol Cal: 33 mg/dL (ref 5–40)

## 2016-02-03 LAB — SPECIMEN STATUS REPORT

## 2016-02-08 MED ORDER — FISH OIL 1000 MG PO CAPS: ORAL_CAPSULE | ORAL | Status: DC

## 2016-02-08 NOTE — Telephone Encounter (Signed)
Roberta Clark, Please advise where I am to obtain patient pain contract for this patient to sign when she comes in the office.

## 2016-02-10 MED ORDER — OMEGA-3-ACID ETHYL ESTERS 1 G PO CAPS: ORAL_CAPSULE | ORAL | Status: DC

## 2016-02-11 ENCOUNTER — Other Ambulatory Visit: Payer: Self-pay

## 2016-02-11 DIAGNOSIS — E785 Hyperlipidemia, unspecified: Secondary | ICD-10-CM

## 2016-02-11 MED ORDER — OMEGA-3-ACID ETHYL ESTERS 1 G PO CAPS: ORAL_CAPSULE | ORAL | Status: DC

## 2016-02-19 ENCOUNTER — Encounter (HOSPITAL_COMMUNITY): Payer: Self-pay | Admitting: Emergency Medicine

## 2016-02-19 ENCOUNTER — Other Ambulatory Visit: Payer: Self-pay

## 2016-02-19 ENCOUNTER — Emergency Department (HOSPITAL_COMMUNITY)
Admission: EM | Admit: 2016-02-19 | Discharge: 2016-02-19 | Disposition: A | Discharge status: Home / Self Care | Payer: Medicaid Other | Attending: Emergency Medicine | Admitting: Emergency Medicine

## 2016-02-19 DIAGNOSIS — Z79899 Other long term (current) drug therapy: Secondary | ICD-10-CM | POA: Insufficient documentation

## 2016-02-19 DIAGNOSIS — I10 Essential (primary) hypertension: Secondary | ICD-10-CM | POA: Insufficient documentation

## 2016-02-19 DIAGNOSIS — M5442 Lumbago with sciatica, left side: Secondary | ICD-10-CM | POA: Insufficient documentation

## 2016-02-19 DIAGNOSIS — F17210 Nicotine dependence, cigarettes, uncomplicated: Secondary | ICD-10-CM | POA: Insufficient documentation

## 2016-02-19 DIAGNOSIS — E785 Hyperlipidemia, unspecified: Secondary | ICD-10-CM | POA: Insufficient documentation

## 2016-02-19 DIAGNOSIS — E119 Type 2 diabetes mellitus without complications: Secondary | ICD-10-CM | POA: Diagnosis not present

## 2016-02-19 DIAGNOSIS — M5432 Sciatica, left side: Secondary | ICD-10-CM

## 2016-02-19 DIAGNOSIS — F329 Major depressive disorder, single episode, unspecified: Secondary | ICD-10-CM | POA: Diagnosis not present

## 2016-02-19 DIAGNOSIS — M545 Low back pain: Secondary | ICD-10-CM | POA: Diagnosis present

## 2016-02-19 MED ORDER — KETOROLAC TROMETHAMINE 60 MG/2ML IM SOLN
60.0000 mg | Freq: Once | INTRAMUSCULAR | Status: AC
  Administered 2016-02-19: 60 mg via INTRAMUSCULAR
  Filled 2016-02-19: qty 2

## 2016-02-19 MED ORDER — NAPROXEN 500 MG PO TABS: 500.0000 mg | ORAL_TABLET | Freq: Two times a day (BID) | ORAL | Status: DC

## 2016-02-19 MED ORDER — PREDNISONE 20 MG PO TABS: ORAL_TABLET | ORAL | Status: DC

## 2016-02-19 MED ORDER — PREDNISONE 20 MG PO TABS
ORAL_TABLET | ORAL | Status: DC
Start: 2016-02-19 — End: 2016-02-19

## 2016-02-19 MED ORDER — OXYCODONE-ACETAMINOPHEN 5-325 MG PO TABS
2.0000 | ORAL_TABLET | Freq: Once | ORAL | Status: AC
  Administered 2016-02-19: 2 via ORAL
  Filled 2016-02-19: qty 2

## 2016-02-19 MED ORDER — DEXAMETHASONE SODIUM PHOSPHATE 10 MG/ML IJ SOLN
10.0000 mg | Freq: Once | INTRAMUSCULAR | Status: AC
  Administered 2016-02-19: 10 mg via INTRAMUSCULAR
  Filled 2016-02-19: qty 1

## 2016-02-19 MED ORDER — IBUPROFEN 800 MG PO TABS: 800.0000 mg | ORAL_TABLET | Freq: Two times a day (BID) | ORAL | Status: DC

## 2016-02-19 MED ORDER — ONDANSETRON 4 MG PO TBDP
4.0000 mg | ORAL_TABLET | Freq: Once | ORAL | Status: AC
  Administered 2016-02-19: 4 mg via ORAL
  Filled 2016-02-19: qty 1

## 2016-02-19 MED ORDER — CYCLOBENZAPRINE HCL 10 MG PO TABS: ORAL_TABLET | ORAL | Status: DC

## 2016-02-19 NOTE — Discharge Instructions (Signed)
SEEK IMMEDIATE MEDICAL ATTENTION IF: New numbness, tingling, weakness, or problem with the use of your arms or legs.  Severe back pain not relieved with medications.  Change in bowel or bladder control.  Increasing pain in any areas of the body (such as chest or abdominal pain).  Shortness of breath, dizziness or fainting.  Nausea (feeling sick to your stomach), vomiting, fever, or sweats.    Sciatica With Rehab The sciatic nerve runs from the back down the leg and is responsible for sensation and control of the muscles in the back (posterior) side of the thigh, lower leg, and foot. Sciatica is a condition that is characterized by inflammation of this nerve.  SYMPTOMS   Signs of nerve damage, including numbness and/or weakness along the posterior side of the lower extremity.  Pain in the back of the thigh that may also travel down the leg.  Pain that worsens when sitting for long periods of time.  Occasionally, pain in the back or buttock. CAUSES  Inflammation of the sciatic nerve is the cause of sciatica. The inflammation is due to something irritating the nerve. Common sources of irritation include:  Sitting for long periods of time.  Direct trauma to the nerve.  Arthritis of the spine.  Herniated or ruptured disk.  Slipping of the vertebrae (spondylolisthesis).  Pressure from soft tissues, such as muscles or ligament-like tissue (fascia). RISK INCREASES WITH:  Sports that place pressure or stress on the spine (football or weightlifting).  Poor strength and flexibility.  Failure to warm up properly before activity.  Family history of low back pain or disk disorders.  Previous back injury or surgery.  Poor body mechanics, especially when lifting, or poor posture. PREVENTION   Warm up and stretch properly before activity.  Maintain physical fitness:  Strength, flexibility, and endurance.  Cardiovascular fitness.  Learn and use proper technique, especially  with posture and lifting. When possible, have coach correct improper technique.  Avoid activities that place stress on the spine. PROGNOSIS If treated properly, then sciatica usually resolves within 6 weeks. However, occasionally surgery is necessary.  RELATED COMPLICATIONS   Permanent nerve damage, including pain, numbness, tingle, or weakness.  Chronic back pain.  Risks of surgery: infection, bleeding, nerve damage, or damage to surrounding tissues. TREATMENT Treatment initially involves resting from any activities that aggravate your symptoms. The use of ice and medication may help reduce pain and inflammation. The use of strengthening and stretching exercises may help reduce pain with activity. These exercises may be performed at home or with referral to a therapist. A therapist may recommend further treatments, such as transcutaneous electronic nerve stimulation (TENS) or ultrasound. Your caregiver may recommend corticosteroid injections to help reduce inflammation of the sciatic nerve. If symptoms persist despite non-surgical (conservative) treatment, then surgery may be recommended. MEDICATION  If pain medication is necessary, then nonsteroidal anti-inflammatory medications, such as aspirin and ibuprofen, or other minor pain relievers, such as acetaminophen, are often recommended.  Do not take pain medication for 7 days before surgery.  Prescription pain relievers may be given if deemed necessary by your caregiver. Use only as directed and only as much as you need.  Ointments applied to the skin may be helpful.  Corticosteroid injections may be given by your caregiver. These injections should be reserved for the most serious cases, because they may only be given a certain number of times. HEAT AND COLD  Cold treatment (icing) relieves pain and reduces inflammation. Cold treatment should be applied for  10 to 15 minutes every 2 to 3 hours for inflammation and pain and immediately  after any activity that aggravates your symptoms. Use ice packs or massage the area with a piece of ice (ice massage).  Heat treatment may be used prior to performing the stretching and strengthening activities prescribed by your caregiver, physical therapist, or athletic trainer. Use a heat pack or soak the injury in warm water. SEEK MEDICAL CARE IF:  Treatment seems to offer no benefit, or the condition worsens.  Any medications produce adverse side effects. EXERCISES  RANGE OF MOTION (ROM) AND STRETCHING EXERCISES - Sciatica Most people with sciatic will find that their symptoms worsen with either excessive bending forward (flexion) or arching at the low back (extension). The exercises which will help resolve your symptoms will focus on the opposite motion. Your physician, physical therapist or athletic trainer will help you determine which exercises will be most helpful to resolve your low back pain. Do not complete any exercises without first consulting with your clinician. Discontinue any exercises which worsen your symptoms until you speak to your clinician. If you have pain, numbness or tingling which travels down into your buttocks, leg or foot, the goal of the therapy is for these symptoms to move closer to your back and eventually resolve. Occasionally, these leg symptoms will get better, but your low back pain may worsen; this is typically an indication of progress in your rehabilitation. Be certain to be very alert to any changes in your symptoms and the activities in which you participated in the 24 hours prior to the change. Sharing this information with your clinician will allow him/her to most efficiently treat your condition. These exercises may help you when beginning to rehabilitate your injury. Your symptoms may resolve with or without further involvement from your physician, physical therapist or athletic trainer. While completing these exercises, remember:   Restoring tissue  flexibility helps normal motion to return to the joints. This allows healthier, less painful movement and activity.  An effective stretch should be held for at least 30 seconds.  A stretch should never be painful. You should only feel a gentle lengthening or release in the stretched tissue. FLEXION RANGE OF MOTION AND STRETCHING EXERCISES: STRETCH - Flexion, Single Knee to Chest   Lie on a firm bed or floor with both legs extended in front of you.  Keeping one leg in contact with the floor, bring your opposite knee to your chest. Hold your leg in place by either grabbing behind your thigh or at your knee.  Pull until you feel a gentle stretch in your low back. Hold __________ seconds.  Slowly release your grasp and repeat the exercise with the opposite side. Repeat __________ times. Complete this exercise __________ times per day.  STRETCH - Flexion, Double Knee to Chest  Lie on a firm bed or floor with both legs extended in front of you.  Keeping one leg in contact with the floor, bring your opposite knee to your chest.  Tense your stomach muscles to support your back and then lift your other knee to your chest. Hold your legs in place by either grabbing behind your thighs or at your knees.  Pull both knees toward your chest until you feel a gentle stretch in your low back. Hold __________ seconds.  Tense your stomach muscles and slowly return one leg at a time to the floor. Repeat __________ times. Complete this exercise __________ times per day.  STRETCH - Low Trunk Rotation  Lie on a firm bed or floor. Keeping your legs in front of you, bend your knees so they are both pointed toward the ceiling and your feet are flat on the floor.  Extend your arms out to the side. This will stabilize your upper body by keeping your shoulders in contact with the floor.  Gently and slowly drop both knees together to one side until you feel a gentle stretch in your low back. Hold for __________  seconds.  Tense your stomach muscles to support your low back as you bring your knees back to the starting position. Repeat the exercise to the other side. Repeat __________ times. Complete this exercise __________ times per day  EXTENSION RANGE OF MOTION AND FLEXIBILITY EXERCISES: STRETCH - Extension, Prone on Elbows  Lie on your stomach on the floor, a bed will be too soft. Place your palms about shoulder width apart and at the height of your head.  Place your elbows under your shoulders. If this is too painful, stack pillows under your chest.  Allow your body to relax so that your hips drop lower and make contact more completely with the floor.  Hold this position for __________ seconds.  Slowly return to lying flat on the floor. Repeat __________ times. Complete this exercise __________ times per day.  RANGE OF MOTION - Extension, Prone Press Ups  Lie on your stomach on the floor, a bed will be too soft. Place your palms about shoulder width apart and at the height of your head.  Keeping your back as relaxed as possible, slowly straighten your elbows while keeping your hips on the floor. You may adjust the placement of your hands to maximize your comfort. As you gain motion, your hands will come more underneath your shoulders.  Hold this position __________ seconds.  Slowly return to lying flat on the floor. Repeat __________ times. Complete this exercise __________ times per day.  STRENGTHENING EXERCISES - Sciatica  These exercises may help you when beginning to rehabilitate your injury. These exercises should be done near your "sweet spot." This is the neutral, low-back arch, somewhere between fully rounded and fully arched, that is your least painful position. When performed in this safe range of motion, these exercises can be used for people who have either a flexion or extension based injury. These exercises may resolve your symptoms with or without further involvement from your  physician, physical therapist or athletic trainer. While completing these exercises, remember:   Muscles can gain both the endurance and the strength needed for everyday activities through controlled exercises.  Complete these exercises as instructed by your physician, physical therapist or athletic trainer. Progress with the resistance and repetition exercises only as your caregiver advises.  You may experience muscle soreness or fatigue, but the pain or discomfort you are trying to eliminate should never worsen during these exercises. If this pain does worsen, stop and make certain you are following the directions exactly. If the pain is still present after adjustments, discontinue the exercise until you can discuss the trouble with your clinician. STRENGTHENING - Deep Abdominals, Pelvic Tilt   Lie on a firm bed or floor. Keeping your legs in front of you, bend your knees so they are both pointed toward the ceiling and your feet are flat on the floor.  Tense your lower abdominal muscles to press your low back into the floor. This motion will rotate your pelvis so that your tail bone is scooping upwards rather than pointing at your  feet or into the floor.  With a gentle tension and even breathing, hold this position for __________ seconds. Repeat __________ times. Complete this exercise __________ times per day.  STRENGTHENING - Abdominals, Crunches   Lie on a firm bed or floor. Keeping your legs in front of you, bend your knees so they are both pointed toward the ceiling and your feet are flat on the floor. Cross your arms over your chest.  Slightly tip your chin down without bending your neck.  Tense your abdominals and slowly lift your trunk high enough to just clear your shoulder blades. Lifting higher can put excessive stress on the low back and does not further strengthen your abdominal muscles.  Control your return to the starting position. Repeat __________ times. Complete this  exercise __________ times per day.  STRENGTHENING - Quadruped, Opposite UE/LE Lift  Assume a hands and knees position on a firm surface. Keep your hands under your shoulders and your knees under your hips. You may place padding under your knees for comfort.  Find your neutral spine and gently tense your abdominal muscles so that you can maintain this position. Your shoulders and hips should form a rectangle that is parallel with the floor and is not twisted.  Keeping your trunk steady, lift your right hand no higher than your shoulder and then your left leg no higher than your hip. Make sure you are not holding your breath. Hold this position __________ seconds.  Continuing to keep your abdominal muscles tense and your back steady, slowly return to your starting position. Repeat with the opposite arm and leg. Repeat __________ times. Complete this exercise __________ times per day.  STRENGTHENING - Abdominals and Quadriceps, Straight Leg Raise   Lie on a firm bed or floor with both legs extended in front of you.  Keeping one leg in contact with the floor, bend the other knee so that your foot can rest flat on the floor.  Find your neutral spine, and tense your abdominal muscles to maintain your spinal position throughout the exercise.  Slowly lift your straight leg off the floor about 6 inches for a count of 15, making sure to not hold your breath.  Still keeping your neutral spine, slowly lower your leg all the way to the floor. Repeat this exercise with each leg __________ times. Complete this exercise __________ times per day. POSTURE AND BODY MECHANICS CONSIDERATIONS - Sciatica Keeping correct posture when sitting, standing or completing your activities will reduce the stress put on different body tissues, allowing injured tissues a chance to heal and limiting painful experiences. The following are general guidelines for improved posture. Your physician or physical therapist will provide  you with any instructions specific to your needs. While reading these guidelines, remember:  The exercises prescribed by your provider will help you have the flexibility and strength to maintain correct postures.  The correct posture provides the optimal environment for your joints to work. All of your joints have less wear and tear when properly supported by a spine with good posture. This means you will experience a healthier, less painful body.  Correct posture must be practiced with all of your activities, especially prolonged sitting and standing. Correct posture is as important when doing repetitive low-stress activities (typing) as it is when doing a single heavy-load activity (lifting). RESTING POSITIONS Consider which positions are most painful for you when choosing a resting position. If you have pain with flexion-based activities (sitting, bending, stooping, squatting), choose a position  that allows you to rest in a less flexed posture. You would want to avoid curling into a fetal position on your side. If your pain worsens with extension-based activities (prolonged standing, working overhead), avoid resting in an extended position such as sleeping on your stomach. Most people will find more comfort when they rest with their spine in a more neutral position, neither too rounded nor too arched. Lying on a non-sagging bed on your side with a pillow between your knees, or on your back with a pillow under your knees will often provide some relief. Keep in mind, being in any one position for a prolonged period of time, no matter how correct your posture, can still lead to stiffness. PROPER SITTING POSTURE In order to minimize stress and discomfort on your spine, you must sit with correct posture Sitting with good posture should be effortless for a healthy body. Returning to good posture is a gradual process. Many people can work toward this most comfortably by using various supports until they have  the flexibility and strength to maintain this posture on their own. When sitting with proper posture, your ears will fall over your shoulders and your shoulders will fall over your hips. You should use the back of the chair to support your upper back. Your low back will be in a neutral position, just slightly arched. You may place a small pillow or folded towel at the base of your low back for support.  When working at a desk, create an environment that supports good, upright posture. Without extra support, muscles fatigue and lead to excessive strain on joints and other tissues. Keep these recommendations in mind: CHAIR:   A chair should be able to slide under your desk when your back makes contact with the back of the chair. This allows you to work closely.  The chair's height should allow your eyes to be level with the upper part of your monitor and your hands to be slightly lower than your elbows. BODY POSITION  Your feet should make contact with the floor. If this is not possible, use a foot rest.  Keep your ears over your shoulders. This will reduce stress on your neck and low back. INCORRECT SITTING POSTURES   If you are feeling tired and unable to assume a healthy sitting posture, do not slouch or slump. This puts excessive strain on your back tissues, causing more damage and pain. Healthier options include:  Using more support, like a lumbar pillow.  Switching tasks to something that requires you to be upright or walking.  Talking a brief walk.  Lying down to rest in a neutral-spine position. PROLONGED STANDING WHILE SLIGHTLY LEANING FORWARD  When completing a task that requires you to lean forward while standing in one place for a long time, place either foot up on a stationary 2-4 inch high object to help maintain the best posture. When both feet are on the ground, the low back tends to lose its slight inward curve. If this curve flattens (or becomes too large), then the back and  your other joints will experience too much stress, fatigue more quickly and can cause pain.  CORRECT STANDING POSTURES Proper standing posture should be assumed with all daily activities, even if they only take a few moments, like when brushing your teeth. As in sitting, your ears should fall over your shoulders and your shoulders should fall over your hips. You should keep a slight tension in your abdominal muscles to brace your  spine. Your tailbone should point down to the ground, not behind your body, resulting in an over-extended swayback posture.  INCORRECT STANDING POSTURES  Common incorrect standing postures include a forward head, locked knees and/or an excessive swayback. WALKING Walk with an upright posture. Your ears, shoulders and hips should all line-up. PROLONGED ACTIVITY IN A FLEXED POSITION When completing a task that requires you to bend forward at your waist or lean over a low surface, try to find a way to stabilize 3 of 4 of your limbs. You can place a hand or elbow on your thigh or rest a knee on the surface you are reaching across. This will provide you more stability so that your muscles do not fatigue as quickly. By keeping your knees relaxed, or slightly bent, you will also reduce stress across your low back. CORRECT LIFTING TECHNIQUES DO :   Assume a wide stance. This will provide you more stability and the opportunity to get as close as possible to the object which you are lifting.  Tense your abdominals to brace your spine; then bend at the knees and hips. Keeping your back locked in a neutral-spine position, lift using your leg muscles. Lift with your legs, keeping your back straight.  Test the weight of unknown objects before attempting to lift them.  Try to keep your elbows locked down at your sides in order get the best strength from your shoulders when carrying an object.  Always ask for help when lifting heavy or awkward objects. INCORRECT LIFTING TECHNIQUES DO  NOT:   Lock your knees when lifting, even if it is a small object.  Bend and twist. Pivot at your feet or move your feet when needing to change directions.  Assume that you cannot safely pick up a paperclip without proper posture.   This information is not intended to replace advice given to you by your health care provider. Make sure you discuss any questions you have with your health care provider.   Document Released: 08/01/2005 Document Revised: 12/16/2014 Document Reviewed: 11/13/2008 [employer] Interactive Patient Education 2016 [employer]   Chronic Back Pain  When back pain lasts longer than 3 months, it is called chronic back pain.People with chronic back pain often go through certain periods that are more intense (flare-ups).  CAUSES Chronic back pain can be caused by wear and tear (degeneration) on different structures in your back. These structures include:  The bones of your spine (vertebrae) and the joints surrounding your spinal cord and nerve roots (facets).  The strong, fibrous tissues that connect your vertebrae (ligaments). Degeneration of these structures may result in pressure on your nerves. This can lead to constant pain. HOME CARE INSTRUCTIONS  Avoid bending, heavy lifting, prolonged sitting, and activities which make the problem worse.  Take brief periods of rest throughout the day to reduce your pain. Lying down or standing usually is better than sitting while you are resting.  Take over-the-counter or prescription medicines only as directed by your caregiver. SEEK IMMEDIATE MEDICAL CARE IF:   You have weakness or numbness in one of your legs or feet.  You have trouble controlling your bladder or bowels.  You have nausea, vomiting, abdominal pain, shortness of breath, or fainting.   This information is not intended to replace advice given to you by your health care provider. Make sure you discuss any questions you have with your health care  provider.   Document Released: 09/08/2004 Document Revised: 10/24/2011 Document Reviewed: 01/19/2015 [employer] Interactive Patient  Education ©2016 [employer] ° °

## 2016-02-19 NOTE — ED Notes (Signed)
PT DISCHARGED. INSTRUCTIONS AND PRESCRIPTIONS GIVEN. AAOX4. PT IN NO APPARENT DISTRESS. THE OPPORTUNITY TO ASK QUESTIONS WAS PROVIDED. 

## 2016-02-19 NOTE — ED Provider Notes (Signed)
CSN: [id number]     Arrival date & time 02/19/16  1320 History  By signing my name below, I, Roberta Clark, attest that this documentation has been prepared under the direction and in the presence of Margarita Mail, PA-C. Electronically Signed: Rayna Clark, ED Scribe. 02/19/2016. 2:44 PM.   Chief Complaint  Patient presents with  . Back Pain   The history is provided by the patient. No language interpreter was used.   HPI Comments: Roberta Clark is a 55 y.o. female with a PMHx of chronic back pain who ambulates with a cane at baseline presents to the Emergency Department complaining of constant, moderate, chronic, left lower back pain which has been present for many years. She reports a radiation of pain to her left groin and down her LLE to her shin. She reports associated, mild, intermittent, numbness in the toes of her left foot and intermittent nocturnal hyperhidrosis which she states is a chronic issue related to menopause. Pt takes 600 mg Gabapentin for chronic pain management which is rx by her listed PCP as well as 440 mg Aleve daily and denies it provides significant pain relief. Pt denies having been evaluated by a neurosurgeon or an orthopaedist for her chronic pain. Pt confirms a hx of smoking. She was evaluated by her PCP on 02/02/2016 and does not have a follow up appointment until September. Pt states she was just approved for Medicaid. She denies bowel or bladder incontinence, saddle anesthesia, fever and chills.   Past Medical History  Diagnosis Date  . Hypertension   . Hyperlipidemia   . Depression   . Chronic pain   . Low back pain with left-sided sciatica     Reportedly with lumbar spinal stenosis  . Diabetes mellitus without complication ([place])   . Insomnia secondary to chronic pain    History reviewed. No pertinent past surgical history. Family History  Problem Relation Age of Onset  . Heart disease Mother 44    Hx of MI   . Hypertension Mother   . Diabetes Mother    . Diabetes Sister   . Graves' disease Sister   . Lupus Father   . Graves' disease Brother   . Diabetes Brother   . Arthritis Daughter     Not clear if RA  . Hypertension Daughter   . Migraines Daughter   . Epilepsy Son     Started 69 months of age after bacterial meningitis  . GER disease Sister    Social History  Substance Use Topics  . Smoking status: Current Some Day Smoker -- 0.05 packs/day    Types: Cigarettes    Start date: 12/17/1976  . Smokeless tobacco: Never Used     Comment: 3 cigs per day  . Alcohol Use: No   OB History    Gravida Para Term Preterm AB TAB SAB Ectopic Multiple Living   2 2 2  0 0 0 0 0 0 2     Review of Systems  Constitutional: Negative for fever and chills.  Musculoskeletal: Positive for myalgias and back pain.  Neurological: Positive for numbness.   Allergies  Lipitor  Home Medications   Prior to Admission medications   Medication Sig Start Date End Date Taking? Authorizing Provider  amitriptyline (ELAVIL) 25 MG tablet Take 1 tablet (25 mg total) by mouth at bedtime as needed for sleep (sleep). 10/05/15   Mack Hook, MD  cyclobenzaprine (FLEXERIL) 10 MG tablet 1 tab by mouth daily at night only as needed. 01/05/16  Mack Hook, MD  FLUoxetine (PROZAC) 20 MG capsule Take 1 capsule (20 mg total) by mouth daily. 10/05/15   Mack Hook, MD  gabapentin (NEURONTIN) 300 MG capsule Take 1 capsule (300 mg total) by mouth 3 (three) times daily. Take two capsules in the AM, two at lunch, and 4 at bedtime. 02/02/16   Mack Hook, MD  gemfibrozil (LOPID) 600 MG tablet 1 tab by mouth twice daily with meals 01/05/16   Mack Hook, MD  ibuprofen (ADVIL,MOTRIN) 800 MG tablet Take 1 tablet (800 mg total) by mouth 2 (two) times daily. 10/05/15   Mack Hook, MD  lisinopril-hydrochlorothiazide (PRINZIDE,ZESTORETIC) 20-12.5 MG tablet Take 1 tablet by mouth daily. 11/09/15   Mack Hook, MD  omega-3 acid ethyl esters  (LOVAZA) 1 g capsule 2 caps by mouth twice daily 02/11/16   Mack Hook, MD  oxyCODONE-acetaminophen (PERCOCET/ROXICET) 5-325 MG tablet Take 1 tablet by mouth every 8 (eight) hours as needed for severe pain. Reported on 11/03/2015 01/26/16   Mack Hook, MD  potassium chloride (K-DUR,KLOR-CON) 10 MEQ tablet 1 tab by mouth daily 01/05/16   Mack Hook, MD   BP 123/83 mmHg  Pulse 86  Temp(Src) 98.4 F (36.9 C) (Oral)  Resp 16  SpO2 100% Physical Exam Constitutional: Pt appears well-developed and well-nourished. No distress.  HENT:  Head: Normocephalic and atraumatic.  Mouth/Throat: Oropharynx is clear and moist. No oropharyngeal exudate.  Eyes: Conjunctivae are normal.  Neck: Normal range of motion. Neck supple.  Full ROM without pain  Cardiovascular: Normal rate, regular rhythm and intact distal pulses.   Pulmonary/Chest: Effort normal and breath sounds normal. No respiratory distress. Pt has no wheezes.  Abdominal: Soft. Pt exhibits no distension. There is no tenderness.  Musculoskeletal:  Full range of motion of the T-spine and L-spine No tenderness to palpation of the spinous processes of the T-spine or L-spine Mild tenderness to palpation of the left paraspinous muscles of the L-spine  Lymphadenopathy:    Pt has no cervical adenopathy.  Neurological: Pt is alert. Pt has normal reflexes. Positive straight leg test at 10 degrees of hip flexion; antalgic gait without foot drop or weakness.  Reflex Scores:      Bicep reflexes are 2+ on the right side and 2+ on the left side.      Brachioradialis reflexes are 2+ on the right side and 2+ on the left side.      Patellar reflexes are 2+ on the right side and 2+ on the left side.      Achilles reflexes are 2+ on the right side and 2+ on the left side. Speech is clear and goal oriented, follows commands Normal 5/5 strength in upper and lower extremities bilaterally including dorsiflexion and plantar flexion, strong and  equal grip strength Sensation normal to light and sharp touch Moves extremities without ataxia, coordination intact Normal gait Normal balance No Clonus  Skin: Skin is warm and dry. No rash noted. Pt is not diaphoretic. No erythema.  Psychiatric: Pt has a normal mood and affect. Behavior is normal.  Nursing note and vitals reviewed.  ED Course  Procedures  DIAGNOSTIC STUDIES: Oxygen Saturation is 100% on RA, normal by my interpretation.    COORDINATION OF CARE: 2:36 PM Discussed next steps with pt. Pt verbalized understanding and is agreeable with the plan.   Labs Review Labs Reviewed - No data to display  Imaging Review No results found.   EKG Interpretation None      MDM   Final diagnoses:  Sciatica neuralgia, left    Patient with back pain.  No neurological deficits and normal neuro exam.  Patient is ambulatory.  No loss of bowel or bladder control.  No concern for cauda equina.  No fever, weight loss, h/o cancer, IVDA, no recent procedure to back. Pt noted intermittent night sweats which she states has been an ongoing issue since she began menopause. No urinary symptoms suggestive of UTI.  Supportive care and return precaution discussed. Appears safe for discharge at this time. Follow up as indicated in discharge paperwork.   I personally performed the services described in this documentation, which was scribed in my presence. The recorded information has been reviewed and is accurate.       Margarita Mail, PA-C 02/19/16 [place], MD 02/20/16 1125

## 2016-02-19 NOTE — ED Notes (Signed)
Pt reports acute chronic lower back pain radiating down left leg unrelieved by home medications.

## 2016-02-24 ENCOUNTER — Other Ambulatory Visit: Payer: Self-pay | Admitting: Internal Medicine

## 2016-03-08 ENCOUNTER — Other Ambulatory Visit: Payer: Self-pay | Admitting: Internal Medicine

## 2016-03-08 ENCOUNTER — Telehealth: Payer: Self-pay

## 2016-03-08 DIAGNOSIS — M5442 Lumbago with sciatica, left side: Secondary | ICD-10-CM

## 2016-03-08 NOTE — Telephone Encounter (Signed)
She does not need Lovaza, but could be taking Omega 3 Fatty Acids over the counter 1000 mg twice daily along with her Gemfibrozil (make sure she is taking that as written as well)

## 2016-03-08 NOTE — Telephone Encounter (Signed)
I received a fax from [facility] for a PA for Luxora. After logging into covermymeds to look at the PA. The information said to call [place] tracks. I checked the Medicaid preferred drug list and Lovaza is not preferred with the exemption being for use of Lovaza or generic in patients with triglycerides =>500mg /dl. Please advise if she needs to be on a triglyceride lowering medication other than the gemfibrozil? Thank you.

## 2016-03-09 NOTE — Telephone Encounter (Signed)
Patient informed to take OTC Fish oil and she verified that she is taking her Gemfibrozil correctly.

## 2016-03-28 ENCOUNTER — Other Ambulatory Visit: Payer: Self-pay | Admitting: Internal Medicine

## 2016-03-28 DIAGNOSIS — M5442 Lumbago with sciatica, left side: Secondary | ICD-10-CM

## 2016-03-29 ENCOUNTER — Encounter: Payer: Self-pay | Admitting: Diagnostic Neuroimaging

## 2016-03-29 ENCOUNTER — Ambulatory Visit (INDEPENDENT_AMBULATORY_CARE_PROVIDER_SITE_OTHER): Payer: Medicaid Other | Admitting: Diagnostic Neuroimaging

## 2016-03-29 VITALS — BP 118/78 | HR 78 | Ht 64.0 in | Wt 121.0 lb

## 2016-03-29 DIAGNOSIS — M5416 Radiculopathy, lumbar region: Secondary | ICD-10-CM | POA: Diagnosis not present

## 2016-03-29 DIAGNOSIS — M4806 Spinal stenosis, lumbar region: Secondary | ICD-10-CM | POA: Diagnosis not present

## 2016-03-29 DIAGNOSIS — M48062 Spinal stenosis, lumbar region with neurogenic claudication: Secondary | ICD-10-CM

## 2016-03-29 NOTE — Patient Instructions (Addendum)
Thank you for coming to see [place] at [facility]. I hope we have been able to provide you high quality care today.  You may receive a patient satisfaction survey over the next few weeks. We would appreciate your feedback and comments so that we may continue to improve ourselves and the health of our patients.  - recommend neurosurgery / spine evaluation, pain mgmt clinic and physical therapy   ~~~~~~~~~~~~~~~~~~~~~~~~~~~~~~~~~~~~~~~~~~~~~~~~~~~~~~~~~~~~~~~~~  DR. Kwali Wrinkle'S GUIDE TO HAPPY AND HEALTHY LIVING These are some of my general health and wellness recommendations. Some of them may apply to you better than others. Please use common sense as you try these suggestions and feel free to ask me any questions.   ACTIVITY/FITNESS Mental, social, emotional and physical stimulation are very important for brain and body health. Try learning a new activity (arts, music, language, sports, games).  Keep moving your body to the best of your abilities. You can do this at home, inside or outside, the park, community center, gym or anywhere you like. Consider a physical therapist or personal trainer to get started. Consider the app Sworkit. Fitness trackers such as smart-watches, smart-phones or Fitbits can help as well.   NUTRITION Eat more plants: colorful vegetables, nuts, seeds and berries.  Eat less sugar, salt, preservatives and processed foods.  Avoid toxins such as cigarettes and alcohol.  Drink water when you are thirsty. Warm water with a slice of lemon is an excellent morning drink to start the day.  Consider these websites for more information The Nutrition Source (https://www.henry-hernandez.biz/) Precision Nutrition (WindowBlog.ch)   RELAXATION Consider practicing mindfulness meditation or other relaxation techniques such as deep breathing, prayer, yoga, tai chi, massage. See website mindful.org or the apps Headspace or  Calm to help get started.   SLEEP Try to get at least 7-8+ hours sleep per day. Regular exercise and reduced caffeine will help you sleep better. Practice good sleep hygeine techniques. See website sleep.org for more information.   PLANNING Prepare estate planning, living will, healthcare POA documents. Sometimes this is best planned with the help of an attorney. Theconversationproject.org and agingwithdignity.org are excellent resources.

## 2016-03-29 NOTE — Progress Notes (Signed)
[facility]  PATIENT: Roberta Clark DOB: 09-26-1960  REFERRING CLINICIAN:  HISTORY FROM: patient  REASON FOR VISIT: follow up   HISTORICAL  CHIEF COMPLAINT:  Chief Complaint  Patient presents with  . Other    rm 7, left lumbar radiculopathy, "increasing pain lower back, all the way down to L foot which gets numb; right groin/thigh pain"  . Follow-up    last seen 12/2013    HISTORY OF PRESENT ILLNESS:   UPDATE 03/29/16: Since last visit, patient continues with low back pain radiating to the left leg. Had MRI in 2015 of lumbar spine with moderate spinal stenosis, therefore I referred to neurosurg. Patient not able to be seen due to insurance issues at that time. Then with to [facility] Dr. Catheryn Bacon (pain mgmt) in 2016 for pain injections. Due to distance and transportation issues, patient not able to continue with treatment there. Therefore Dr. Amil Amen referred patient to local pain mgmt clinic (waiting to hear back from Dr. Vira Blanco) and also neurosurgery (has seen Dr. Sherley Bounds). Now taking gabapentin, ibuprofen, cyclobenzaprine, naproxen, meloxicam for pain.   UPDATE 12/18/13: Since last visit, continues with low back pain radiating to the left leg (groin, thigh) with diff walking. Tried gabapentin 300mg  BID without relief. Has not been contact about MRI that was ordered at last visit.  PRIOR HPI (06/14/13): 55 year old right-handed female here for evaluation of back pain. For past 5 years patient has had low back pain radiating from her lower back down to her left leg, left foot. Now she's having some pain into her right leg. She tried physical therapy in 2012 without relief. Now pain is increasing in severity. Sitting down makes her pain worse. She is on meloxicam, Advil, Aleve, Flexeril as needed for pain control.  REVIEW OF SYSTEMS: Full 14 system review of systems performed and negative: unexpected at change blurred vision numbness.   ALLERGIES: Allergies  Allergen  Reactions  . Lipitor [Atorvastatin] Rash    HOME MEDICATIONS: Outpatient Medications Prior to Visit  Medication Sig Dispense Refill  . amitriptyline (ELAVIL) 25 MG tablet Take 1 tablet (25 mg total) by mouth at bedtime as needed for sleep (sleep). 30 tablet 11  . cyclobenzaprine (FLEXERIL) 10 MG tablet TAKE ONE TABLET BY MOUTH AT NIGHT ONLY AS NEEDED 20 tablet 0  . FLUoxetine (PROZAC) 20 MG capsule Take 1 capsule (20 mg total) by mouth daily. 30 capsule 11  . gabapentin (NEURONTIN) 300 MG capsule Take 1 capsule (300 mg total) by mouth 3 (three) times daily. Take two capsules in the AM, two at lunch, and 4 at bedtime. 240 capsule 11  . gemfibrozil (LOPID) 600 MG tablet 1 tab by mouth twice daily with meals 60 tablet 11  . lisinopril-hydrochlorothiazide (PRINZIDE,ZESTORETIC) 20-12.5 MG tablet Take 1 tablet by mouth daily. 30 tablet 11  . naproxen (NAPROSYN) 500 MG tablet Take 1 tablet (500 mg total) by mouth 2 (two) times daily with a meal. 30 tablet 0  . omega-3 acid ethyl esters (LOVAZA) 1 g capsule 2 caps by mouth twice daily 120 capsule 11  . potassium chloride (K-DUR,KLOR-CON) 10 MEQ tablet 1 tab by mouth daily 30 tablet 11  . oxyCODONE-acetaminophen (PERCOCET/ROXICET) 5-325 MG tablet Take 1 tablet by mouth every 8 (eight) hours as needed for severe pain. Reported on 11/03/2015 30 tablet 0  . predniSONE (DELTASONE) 20 MG tablet 3 tabs po daily x 3 days, then 2 tabs x 3 days, then 1.5 tabs x 3 days, then 1  tab x 3 days, then 0.5 tabs x 3 days 27 tablet 0   No facility-administered medications prior to visit.     PAST MEDICAL HISTORY: Past Medical History:  Diagnosis Date  . Chronic pain   . Depression   . Diabetes mellitus without complication ([place])   . Hyperlipidemia   . Hypertension   . Insomnia secondary to chronic pain   . Low back pain with left-sided sciatica    Reportedly with lumbar spinal stenosis    PAST SURGICAL HISTORY: Past Surgical History:  Procedure Laterality  Date  . TUBAL LIGATION  1985    FAMILY HISTORY: Family History  Problem Relation Age of Onset  . Heart disease Mother 40    Hx of MI   . Hypertension Mother   . Diabetes Mother   . Diabetes Sister   . Graves' disease Sister   . Lupus Father   . Graves' disease Brother   . Diabetes Brother   . Arthritis Daughter     Not clear if RA  . Hypertension Daughter   . Migraines Daughter   . Epilepsy Son     Started 45 months of age after bacterial meningitis  . GER disease Sister     SOCIAL HISTORY:  Social History   Social History  . Marital status: Single    Spouse name: N/A  . Number of children: 2  . Years of education: College   Occupational History  . 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 [employer]    Psychologist, forensic   Social History Main Topics  . Smoking status: Current Some Day Smoker    Packs/day: 0.05    Types: Cigarettes    Start date: 12/17/1976  . Smokeless tobacco: Never Used     Comment: 3 cigs per day 03/29/16  . Alcohol use No  . Drug use: No  . Sexual activity: Yes    Birth control/ protection: Post-menopausal   Other Topics Concern  . Not on file   Social History Narrative   Lives alone   Has a significant other--did not want to elaborate.   Children live in town and are supportive.   Originally from [place].   Went to [employer] [place].        PHYSICAL EXAM  Vitals:   03/29/16 0830  BP: 118/78  Pulse: 78  Weight: 121 lb (54.9 kg)  Height: 5\' 4"  (1.626 m)    Not recorded      Body mass index is 20.77 kg/m.  GENERAL EXAM:  Patient is in no distress.   CARDIOVASCULAR:  Regular rate and rhythm, no murmurs, no carotid bruits   NEUROLOGIC:  MENTAL STATUS: awake, alert, language fluent, comprehension intact, naming intact  CRANIAL NERVE: no papilledema on fundoscopic exam, pupils equal and reactive to light, visual fields full to confrontation, extraocular muscles intact, no nystagmus, facial sensation and strength symmetric, uvula  midline, shoulder shrug symmetric, tongue midline.  MOTOR: normal bulk and tone, full strength in the BUE, RLE; LLE HF/KE/KF LIMITED BY PAIN; LEFT DF FULL STRENGTH SENSORY: normal and symmetric to light touch, temperature, vibration  COORDINATION: finger-nose-finger, fine finger movements normal  REFLEXES: BUE 2, KNEES 1, RIGHT ANKLE 1, LEFT ANKLE TRACE.  GAIT/STATION: USES SINGLE POINT CANE; ANTALGIC, SLOW GAIT. LIMPS ON LEFT LEG. Romberg is negative     DIAGNOSTIC DATA (LABS, IMAGING, TESTING) - I reviewed patient records, labs, notes, testing and imaging myself where available.  Lab Results  Component Value Date   WBC 10.0 03/27/2012  HGB 11.7 (L) 03/27/2012   HCT 35.5 (L) 03/27/2012   MCV 87.9 03/27/2012   PLT 316 03/27/2012      Component Value Date/Time   NA 142 01/26/2016 1607   K 4.1 01/26/2016 1607   CL 98 01/26/2016 1607   CO2 24 01/26/2016 1607   GLUCOSE 106 (H) 01/26/2016 1607   GLUCOSE 65 (L) 07/25/2012 1413   BUN 31 (H) 01/26/2016 1607   CREATININE 0.93 01/26/2016 1607   CREATININE 0.69 07/25/2012 1413   CALCIUM 10.1 01/26/2016 1607   PROT 7.2 10/06/2015 1000   ALBUMIN 4.2 10/06/2015 1000   AST 18 10/06/2015 1000   ALT 14 10/06/2015 1000   ALKPHOS 66 10/06/2015 1000   BILITOT <0.2 10/06/2015 1000   GFRNONAA 69 01/26/2016 1607   GFRAA 79 01/26/2016 1607   Lab Results  Component Value Date   CHOL 264 (H) 01/26/2016   HDL 43 01/26/2016   LDLCALC 188 (H) 01/26/2016   LDLDIRECT 111 (H) 06/10/2009   TRIG 165 (H) 01/26/2016   CHOLHDL 4.4 07/25/2012   Lab Results  Component Value Date   HGBA1C 6.3 (H) 01/26/2016   No results found for: VITAMINB12 Lab Results  Component Value Date   TSH 0.842 03/12/2008    03/24/10 MRI LUMBAR - Broad-based disc protrusion at L4-5 contributing to moderate spinal and lateral recess stenosis.   12/22/13 MRI lumbar spine (without) [I reviewed images myself and agree with interpretation. -VRP]  1. At L4-5: disc bulging  with facet and ligamentum flavum hypertrophy with moderate spinal stenosis and moderate biforaminal stenosis  2. Slight worsening of degenerative spine disease at L4-5 compared to MRI on 03/24/10.     ASSESSMENT AND PLAN  55 y.o. year old female here with low back pain since 2009, with radiating symptoms into left leg. Exam notable for decreased left ankle jerk reflex, left leg pain and antalgic gait. MRIs in the past consistent with moderate spinal stenosis due to degenerative spine disease.  Dx:  Left lumbar radiculopathy  Spinal stenosis, lumbar region, with neurogenic claudication    PLAN:  I spent 15 minutes of face to face time with patient. Greater than 50% of time was spent in counseling and coordination of care with patient. In summary we discussed:  - recommend neurosurgery / spine evaluation, pain mgmt clinic and physical therapy  Return for return to PCP, pain mgmt clinic and neurosurgery clinic.    Penni Bombard, MD [id number], [id number] AM Certified in Neurology, Neurophysiology and Neuroimaging  [facility] [address], [place], [place] [address] [phone]

## 2016-04-06 ENCOUNTER — Telehealth: Payer: Self-pay | Admitting: Internal Medicine

## 2016-04-06 DIAGNOSIS — M5442 Lumbago with sciatica, left side: Secondary | ICD-10-CM

## 2016-04-06 MED ORDER — CYCLOBENZAPRINE HCL 10 MG PO TABS: ORAL_TABLET | ORAL | 0 refills | Status: DC

## 2016-04-06 NOTE — Telephone Encounter (Signed)
Patient informed and has received message from pharmacy as well.  04/06/2016.

## 2016-04-06 NOTE — Telephone Encounter (Signed)
rx sent

## 2016-04-06 NOTE — Telephone Encounter (Signed)
Patient called pharmacy and was told no refill for her  FLEXERIL 10 mg.  Would like called into [facility] on [address].

## 2016-04-16 ENCOUNTER — Emergency Department (HOSPITAL_COMMUNITY): Payer: Medicaid Other

## 2016-04-16 ENCOUNTER — Encounter (HOSPITAL_COMMUNITY): Payer: Self-pay

## 2016-04-16 ENCOUNTER — Emergency Department (HOSPITAL_COMMUNITY)
Admission: EM | Admit: 2016-04-16 | Discharge: 2016-04-16 | Disposition: A | Discharge status: Home / Self Care | Payer: Medicaid Other | Attending: Emergency Medicine | Admitting: Emergency Medicine

## 2016-04-16 DIAGNOSIS — G8929 Other chronic pain: Secondary | ICD-10-CM | POA: Diagnosis not present

## 2016-04-16 DIAGNOSIS — E119 Type 2 diabetes mellitus without complications: Secondary | ICD-10-CM | POA: Diagnosis not present

## 2016-04-16 DIAGNOSIS — M5442 Lumbago with sciatica, left side: Secondary | ICD-10-CM | POA: Insufficient documentation

## 2016-04-16 DIAGNOSIS — M5432 Sciatica, left side: Secondary | ICD-10-CM

## 2016-04-16 DIAGNOSIS — M5431 Sciatica, right side: Secondary | ICD-10-CM

## 2016-04-16 DIAGNOSIS — M5441 Lumbago with sciatica, right side: Secondary | ICD-10-CM | POA: Diagnosis not present

## 2016-04-16 DIAGNOSIS — Z791 Long term (current) use of non-steroidal anti-inflammatories (NSAID): Secondary | ICD-10-CM | POA: Insufficient documentation

## 2016-04-16 DIAGNOSIS — I10 Essential (primary) hypertension: Secondary | ICD-10-CM | POA: Insufficient documentation

## 2016-04-16 DIAGNOSIS — Z79899 Other long term (current) drug therapy: Secondary | ICD-10-CM | POA: Diagnosis not present

## 2016-04-16 DIAGNOSIS — F17210 Nicotine dependence, cigarettes, uncomplicated: Secondary | ICD-10-CM | POA: Diagnosis not present

## 2016-04-16 DIAGNOSIS — M545 Low back pain: Secondary | ICD-10-CM | POA: Diagnosis present

## 2016-04-16 DIAGNOSIS — M549 Dorsalgia, unspecified: Secondary | ICD-10-CM

## 2016-04-16 MED ORDER — OXYCODONE-ACETAMINOPHEN 5-325 MG PO TABS
1.0000 | ORAL_TABLET | Freq: Once | ORAL | Status: AC
  Administered 2016-04-16: 1 via ORAL
  Filled 2016-04-16: qty 1

## 2016-04-16 NOTE — ED Provider Notes (Signed)
[facility] DEPT Provider Note   CSN: [id number] Arrival date & time: 04/16/16  1820     History   Chief Complaint Chief Complaint  Patient presents with  . Back Pain    HPI Roberta Clark is a 54 y.o. female.  55 year old African-American female past medical history of chronic back pain with sciatic pain presents to the ED today with complaint of constant, chronic, lower back pain that radiates to left and right thigh. Patient with history of sciatic pain in left leg. She is currently being evaluated by neurosurgeon. Patient was recently approved for Medicaid and trying to schedule MRI per neurosurgery's request. He has not scheduled yet. Numbness and tingling down left leg to left foot which is baseline for patient. Pain in right leg and inner thigh is new in onset. She denies any numbness and tingling in right leg. Patient takes 600 mg gabapentin for chronic pain management. She has tried ibuprofen and Tylenol with little relief. Walking makes the pain worse. She denies any unilateral leg swelling. She denies any fever, chills, chest pain, shortness of breath, abdominal pain, nausea, vomiting, loss of bowel or bladder, urinary retention, saddle paresthesias. She denies any recent injury. Patient is able ambulate with cane and help from her daughter.       Back Pain   This is a chronic problem. The current episode started 3 to 5 hours ago. The problem occurs constantly. The problem has not changed since onset.The pain is associated with no known injury. The pain is present in the lumbar spine. The pain radiates to the left thigh, left knee and right thigh. The pain is at a severity of 10/10. The pain is moderate. The symptoms are aggravated by bending and twisting. Pertinent negatives include no chest pain, no fever, no headaches, no abdominal pain, no dysuria and no weakness.    Past Medical History:  Diagnosis Date  . Chronic pain   . Depression   . Diabetes mellitus without  complication ([place])   . Hyperlipidemia   . Hypertension   . Insomnia secondary to chronic pain   . Low back pain with left-sided sciatica    Reportedly with lumbar spinal stenosis    Patient Active Problem List   Diagnosis Date Noted  . Type 2 diabetes mellitus ([place]) 11/03/2015  . Lumbar radiculopathy, chronic 06/14/2013  . Vaginal discharge 08/22/2012  . Hypertension 07/25/2012  . Hyperlipidemia with target LDL less than 100 07/25/2012  . Preventative health care 07/25/2012  . Hot flash, menopausal 07/25/2012  . Chronic low back pain 07/25/2012  . Fibroids 09/02/2011  . Tobacco dependence 09/02/2011    Past Surgical History:  Procedure Laterality Date  . TUBAL LIGATION  1985    OB History    Gravida Para Term Preterm AB Living   2 2 2  0 0 2   SAB TAB Ectopic Multiple Live Births   0 0 0 0         Home Medications    Prior to Admission medications   Medication Sig Start Date End Date Taking? Authorizing Provider  amitriptyline (ELAVIL) 25 MG tablet Take 1 tablet (25 mg total) by mouth at bedtime as needed for sleep (sleep). 10/05/15   Mack Hook, MD  cyclobenzaprine (FLEXERIL) 10 MG tablet TAKE ONE TABLET BY MOUTH AT NIGHT ONLY AS NEEDED 04/06/16   Mack Hook, MD  FLUoxetine (PROZAC) 20 MG capsule Take 1 capsule (20 mg total) by mouth daily. 10/05/15   Mack Hook, MD  gabapentin (NEURONTIN) 300 MG capsule Take 1 capsule (300 mg total) by mouth 3 (three) times daily. Take two capsules in the AM, two at lunch, and 4 at bedtime. 02/02/16   Mack Hook, MD  gemfibrozil (LOPID) 600 MG tablet 1 tab by mouth twice daily with meals 01/05/16   Mack Hook, MD  ibuprofen (ADVIL,MOTRIN) 800 MG tablet Take 800 mg by mouth. As needed 04/24/15   Historical Provider, MD  lisinopril-hydrochlorothiazide (PRINZIDE,ZESTORETIC) 20-12.5 MG tablet Take 1 tablet by mouth daily. 11/09/15   Mack Hook, MD  meloxicam (MOBIC) 15 MG tablet 15 mg. As needed  05/20/13   Historical Provider, MD  naproxen (NAPROSYN) 500 MG tablet Take 1 tablet (500 mg total) by mouth 2 (two) times daily with a meal. 02/19/16   Margarita Mail, PA-C  omega-3 acid ethyl esters (LOVAZA) 1 g capsule 2 caps by mouth twice daily 02/11/16   Mack Hook, MD  potassium chloride (K-DUR,KLOR-CON) 10 MEQ tablet 1 tab by mouth daily 01/05/16   Mack Hook, MD    Family History Family History  Problem Relation Age of Onset  . Heart disease Mother 57    Hx of MI   . Hypertension Mother   . Diabetes Mother   . Diabetes Sister   . Graves' disease Sister   . Lupus Father   . Graves' disease Brother   . Diabetes Brother   . Arthritis Daughter     Not clear if RA  . Hypertension Daughter   . Migraines Daughter   . Epilepsy Son     Started 16 months of age after bacterial meningitis  . GER disease Sister     Social History Social History  Substance Use Topics  . Smoking status: Current Some Day Smoker    Packs/day: 0.05    Types: Cigarettes    Start date: 12/17/1976  . Smokeless tobacco: Never Used     Comment: 3 cigs per day 03/29/16  . Alcohol use No     Allergies   Lipitor [atorvastatin]   Review of Systems Review of Systems  Constitutional: Negative for chills and fever.  HENT: Negative for congestion, ear pain and sore throat.   Eyes: Negative for pain and visual disturbance.  Respiratory: Negative for cough and shortness of breath.   Cardiovascular: Negative for chest pain, palpitations and leg swelling.  Gastrointestinal: Negative for abdominal pain, diarrhea, nausea and vomiting.  Genitourinary: Negative for dysuria and hematuria.  Musculoskeletal: Positive for back pain. Negative for arthralgias.  Skin: Negative for color change and rash.  Neurological: Negative for dizziness, syncope, weakness, light-headedness and headaches.  All other systems reviewed and are negative.    Physical Exam Updated Vital Signs BP 150/94 (BP Location:  Right Arm)   Pulse 94   Temp 98.3 F (36.8 C) (Oral)   Resp 16   Ht 5\' 4"  (1.626 m)   Wt 54.9 kg   SpO2 99%   BMI 20.77 kg/m   Physical Exam Physical Exam  Constitutional: Pt appears well-developed and well-nourished. No distress.  HENT:  Head: Normocephalic and atraumatic.  Mouth/Throat: Oropharynx is clear and moist. No oropharyngeal exudate.  Eyes: Conjunctivae are normal.  Neck: Normal range of motion. Neck supple.  Full ROM without pain  Cardiovascular: Normal rate, regular rhythm and intact distal pulses. DP pulses 2+.  Pulmonary/Chest: Effort normal and breath sounds normal. No respiratory distress. Pt has no wheezes.  Abdominal: Soft. Pt exhibits no distension. There is no tenderness.  Musculoskeletal:  Full range  of motion of the T-spine and L-spine No tenderness to palpation of the spinous processes of the T-spine or L-spine  tenderness to palpation of the paraspinous muscles of the L-spine Positive straight leg test on left which is baseline, negative straight leg test on right.  Lymphadenopathy:    Pt has no cervical adenopathy.  Neurological: Pt is alert. Pt has normal reflexes.  Reflex Scores:      Bicep reflexes are 2+ on the right side and 2+ on the left side.      Brachioradialis reflexes are 2+ on the right side and 2+ on the left side.      Patellar reflexes are 2+ on the right side and 2+ on the left side.      Achilles reflexes are 2+ on the right side and 2+ on the left side. Speech is clear and goal oriented, follows commands Normal 5/5 strength in upper and lower extremities bilaterally including dorsiflexion and plantar flexion, strong and equal grip strength Sensation normal to light and sharp touch Moves extremities without ataxia, coordination intact Normal gait walks with cane    Skin: Skin is warm and dry. No rash noted. Pt is not diaphoretic. No erythema.  Psychiatric: Pt has a normal mood and affect. Behavior is normal.  Nursing note and  vitals reviewed.    ED Treatments / Results  Labs (all labs ordered are listed, but only abnormal results are displayed) Labs Reviewed - No data to display  EKG  EKG Interpretation None       Radiology Dg Lumbar Spine Complete  Result Date: 04/16/2016 CLINICAL DATA:  Low back/lumbar spine pain radiating into left leg. EXAM: LUMBAR SPINE - COMPLETE 4+ VIEW COMPARISON:  12/22/2013 MR. 10/29/2009 radiographs FINDINGS: There is no evidence of lumbar spine fracture. Alignment is normal. Intervertebral disc spaces are maintained. IMPRESSION: Negative. Electronically Signed   By: Margarette Canada M.D.   On: 04/16/2016 20:16    Procedures Procedures (including critical care time)  Medications Ordered in ED Medications  oxyCODONE-acetaminophen (PERCOCET/ROXICET) 5-325 MG per tablet 1 tablet (not administered)     Initial Impression / Assessment and Plan / ED Course  I have reviewed the triage vital signs and the nursing notes.  Pertinent labs & imaging results that were available during my care of the patient were reviewed by me and considered in my medical decision making (see chart for details).  Clinical Course  Patient presents with chronic lower back pain with sciatic pain that is baseline for patient. No neurological deficits and normal neuro exam.  Patient can walk but states is painful.  Walks with a cane. Normal gait. No loss of bowel or bladder control.  No concern for cauda equina.  No fever, night sweats, weight loss, h/o cancer, IVDU.  [facility] protocol and pain medicine indicated and discussed with patient. Patient is suppose to follow up with neurosurgeon to have MRI of spine. Patient has ibuprofen, gabapentin, and flexeril at home advised to continue taking. Discussed with patient she needs to follow to have MRI. Patient verbalized understanding. Strict return precautions given. Hemodynamically stable. Discharge home in NAD with stable VS.   Final Clinical Impressions(s) / ED  Diagnoses   Final diagnoses:  Chronic back pain  Bilateral sciatica    New Prescriptions New Prescriptions   No medications on file     Aaron Edelman 04/16/16 2043    Julianne Rice, MD 04/17/16 2342

## 2016-04-16 NOTE — Discharge Instructions (Signed)
Take ibuprofen and tylenol as needed for pain. Follow up with neurosurgeon to have your MRI. If you develop fevers, loss of bowel or bladder, urinary retention, or numbness in your groin return to the ED. Follow up with your PCP.

## 2016-04-16 NOTE — ED Triage Notes (Signed)
She c/o worsening low back pain, which has been radiating into left leg.  She states it has increased recently, and is beginning to radiate into her upper right leg also.  She further tells me that "it's getting so bad I can't walk".

## 2016-05-04 ENCOUNTER — Encounter: Payer: Self-pay | Admitting: Internal Medicine

## 2016-05-04 ENCOUNTER — Other Ambulatory Visit: Payer: Self-pay | Admitting: Internal Medicine

## 2016-05-04 DIAGNOSIS — M5442 Lumbago with sciatica, left side: Secondary | ICD-10-CM

## 2016-05-06 ENCOUNTER — Ambulatory Visit (INDEPENDENT_AMBULATORY_CARE_PROVIDER_SITE_OTHER): Payer: Medicaid Other | Admitting: Internal Medicine

## 2016-05-06 ENCOUNTER — Other Ambulatory Visit: Payer: Self-pay | Admitting: Internal Medicine

## 2016-05-06 ENCOUNTER — Encounter: Payer: Self-pay | Admitting: Internal Medicine

## 2016-05-06 VITALS — BP 142/86 | HR 78 | Resp 16 | Ht 63.0 in | Wt 117.0 lb

## 2016-05-06 DIAGNOSIS — Z0000 Encounter for general adult medical examination without abnormal findings: Secondary | ICD-10-CM | POA: Diagnosis not present

## 2016-05-06 DIAGNOSIS — E785 Hyperlipidemia, unspecified: Secondary | ICD-10-CM | POA: Diagnosis not present

## 2016-05-06 DIAGNOSIS — E119 Type 2 diabetes mellitus without complications: Secondary | ICD-10-CM | POA: Diagnosis not present

## 2016-05-06 DIAGNOSIS — N898 Other specified noninflammatory disorders of vagina: Secondary | ICD-10-CM | POA: Diagnosis not present

## 2016-05-06 DIAGNOSIS — Z124 Encounter for screening for malignant neoplasm of cervix: Secondary | ICD-10-CM | POA: Diagnosis not present

## 2016-05-06 DIAGNOSIS — K635 Polyp of colon: Secondary | ICD-10-CM

## 2016-05-06 DIAGNOSIS — G8929 Other chronic pain: Secondary | ICD-10-CM

## 2016-05-06 DIAGNOSIS — Z1239 Encounter for other screening for malignant neoplasm of breast: Secondary | ICD-10-CM

## 2016-05-06 DIAGNOSIS — M5442 Lumbago with sciatica, left side: Secondary | ICD-10-CM | POA: Diagnosis not present

## 2016-05-06 LAB — POCT WET PREP WITH KOH
KOH PREP POC: NEGATIVE
RBC WET PREP PER HPF POC: NEGATIVE
Trichomonas, UA: NEGATIVE
Yeast Wet Prep HPF POC: NEGATIVE

## 2016-05-06 LAB — GLUCOSE, POCT (MANUAL RESULT ENTRY): POC GLUCOSE: 88 mg/dL (ref 70–99)

## 2016-05-06 MED ORDER — METRONIDAZOLE 500 MG PO TABS: ORAL_TABLET | ORAL | 0 refills | Status: DC

## 2016-05-06 MED ORDER — CYCLOBENZAPRINE HCL 10 MG PO TABS: ORAL_TABLET | ORAL | 4 refills | Status: DC

## 2016-05-06 NOTE — Patient Instructions (Signed)
Check with pain clinic as to whether they have anything available to help with posture, gait and muscle relaxation.

## 2016-05-06 NOTE — Progress Notes (Signed)
Subjective:    Patient ID: Roberta Clark, female    DOB: [birth year], 55 y.o.   MRN: [id number]  HPI   CPE with pap  1.  Pap: Last in 2013.  Always normal. Stable partner of 8 years  2.  Mammogram:  05/08/2015.  Always normal  3.  Osteoprevention:  Has 2-3 servings of dairy daily.  Also takes a multivitamin she knows has calcium and Vitamin D.  Does walk every day as well.  3 blocks.  Takes her a while with her leg pain.  Applied for [employer], but has not checked back with them.  4.  Guaiac Cards:  Has never performed   5.  Colonoscopy:  Sessile serrated polyp with high grade dysplasia at [facility] ([facility]) + diverticulosis on colonoscopy 05/13/2013.  This was a screening colonoscopy.  6.  Immunizations: Immunization History  Administered Date(s) Administered  . Pneumococcal Polysaccharide-23 02/02/2016  . Tdap 08/16/2011   Current Meds  Medication Sig  . amitriptyline (ELAVIL) 25 MG tablet Take 1 tablet (25 mg total) by mouth at bedtime as needed for sleep (sleep).  Marland Kitchen FLUoxetine (PROZAC) 20 MG capsule Take 1 capsule (20 mg total) by mouth daily.  Marland Kitchen gabapentin (NEURONTIN) 300 MG capsule Take 1 capsule (300 mg total) by mouth 3 (three) times daily. Take two capsules in the AM, two at lunch, and 4 at bedtime. (Patient taking differently: Take 600-1,200 mg by mouth 3 (three) times daily. Take two capsules in the AM, two at lunch, and 4 at bedtime.)  . gemfibrozil (LOPID) 600 MG tablet 1 tab by mouth twice daily with meals  . ibuprofen (ADVIL,MOTRIN) 800 MG tablet Take 800 mg by mouth 2 (two) times daily as needed for moderate pain.   Marland Kitchen lisinopril-hydrochlorothiazide (PRINZIDE,ZESTORETIC) 20-12.5 MG tablet Take 1 tablet by mouth daily.  . Multiple Vitamins-Minerals (CENTRUM SILVER 50+WOMEN) TABS Take 1 tablet by mouth daily.  . Naproxen Sod-Diphenhydramine (ALEVE PM) 220-25 MG TABS Take 2 tablets by mouth at bedtime.   . Omega-3 Fatty Acids (FISH OIL) 1000 MG CAPS Take 1,000  mg by mouth 3 (three) times daily.   . potassium chloride (K-DUR,KLOR-CON) 10 MEQ tablet 1 tab by mouth daily  . [DISCONTINUED] cyclobenzaprine (FLEXERIL) 10 MG tablet TAKE ONE TABLET BY MOUTH AT NIGHT ONLY AS NEEDED  . [DISCONTINUED] cyclobenzaprine (FLEXERIL) 10 MG tablet TAKE ONE TABLET BY MOUTH AT NIGHT ONLY AS NEEDED  . [DISCONTINUED] naproxen (NAPROSYN) 500 MG tablet Take 1 tablet (500 mg total) by mouth 2 (two) times daily with a meal.   Allergies  Allergen Reactions  . Lipitor [Atorvastatin] Rash     Past Medical History:  Diagnosis Date  . Chronic pain   . Depression   . Diabetes mellitus without complication ([place])   . Hyperlipidemia   . Hypertension   . Insomnia secondary to chronic pain   . Low back pain with left-sided sciatica    Reportedly with lumbar spinal stenosis   Past Surgical History:  Procedure Laterality Date  . TUBAL LIGATION  1985   Family History  Problem Relation Age of Onset  . Heart disease Mother 73    Hx of MI   . Hypertension Mother   . Diabetes Mother   . Diabetes Sister   . Graves' disease Sister   . Lupus Father   . Graves' disease Brother   . Diabetes Brother   . Arthritis Daughter     Not clear if RA  . Hypertension  Daughter   . Migraines Daughter   . Epilepsy Son     Started 69 months of age after bacterial meningitis  . GER disease Sister    Social History   Social History  . Marital status: Single    Spouse name: N/A  . Number of children: 2  . Years of education: College   Occupational History  . 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 [employer]    Psychologist, forensic   Social History Main Topics  . Smoking status: Current Some Day Smoker    Packs/day: 0.05    Types: Cigarettes    Start date: 12/17/1976  . Smokeless tobacco: Never Used     Comment: 3 cigs per day 03/29/16  . Alcohol use No  . Drug use: No  . Sexual activity: Yes    Birth control/ protection: Post-menopausal   Other Topics Concern  . Not on file   Social  History Narrative   Lives alone   Has a significant other--did not want to elaborate.   Children live in town and are supportive.   Originally from [place].   Went to [employer].           Review of Systems  Constitutional: Negative for appetite change, fatigue, fever and unexpected weight change.  HENT: Negative for dental problem, ear pain, hearing loss, rhinorrhea and sinus pressure.   Eyes: Positive for visual disturbance.       Had eye exam.  Needs to get glasses.     Respiratory: Negative for cough and shortness of breath.   Cardiovascular: Negative for chest pain, palpitations and leg swelling.  Gastrointestinal: Negative for abdominal pain, blood in stool, constipation, diarrhea, nausea and vomiting.       No melena  Endocrine: Negative for polydipsia and polyuria.  Genitourinary: Positive for vaginal discharge. Negative for dysuria, flank pain and genital sores.       White vaginal discharge with some odor.  No itching.  Has had for months.  No change over time.  No associated pelvic pain  Musculoskeletal:       Uses cane with right arm and has some discomfort in that arm due to chronic cane use. Chronic back pain now with radiation into right leg. Preferred Pain Clinic:  Had MRI L/S spine done this past week.  Will have Xray with spine clinic for Dr. Ronnald Ramp at next visit.  He wants to have both MRI and Xray Plan for treatment to be determined next visit, next Monday. Goes to Dr. Ronnald Ramp with Spine clinic next Tuesday  Skin: Negative for color change and rash.  Neurological:       Chronic numbness, tingling in both legs with back pain with weakness only in left leg  Hematological: Negative for adenopathy.       Seems to bruise in arms  Psychiatric/Behavioral: Positive for dysphoric mood. Negative for suicidal ideas. The patient is not nervous/anxious.        Does not want counseling.  Feels this is all related to chronic pain issues.       Objective:   Physical  Exam  Constitutional: She is oriented to person, place, and time. She appears well-developed and well-nourished.  HENT:  Head: Normocephalic and atraumatic.  Right Ear: Hearing, tympanic membrane, external ear and ear canal normal.  Left Ear: Hearing, tympanic membrane, external ear and ear canal normal.  Nose: Nose normal.  Mouth/Throat: Uvula is midline, oropharynx is clear and moist and mucous membranes are normal.  Eyes: Conjunctivae and  EOM are normal. Pupils are equal, round, and reactive to light.  Discs sharp bilaterally  Neck: Normal range of motion. Neck supple. No thyromegaly present.  Cardiovascular: Normal rate, regular rhythm, S1 normal and S2 normal.  PMI is not displaced.  Exam reveals no S3, no S4 and no friction rub.   No murmur heard. No carotid bruits.  Carotid, radial, femoral, DP and PT pulses normal and equal.   Pulmonary/Chest: Effort normal and breath sounds normal. Right breast exhibits no inverted nipple, no mass, no nipple discharge, no skin change and no tenderness. Left breast exhibits no inverted nipple, no mass, no nipple discharge, no skin change and no tenderness.  Abdominal: Soft. Bowel sounds are normal. She exhibits no mass. There is no hepatosplenomegaly. There is no tenderness. No hernia.  Genitourinary: Rectum normal, vagina normal and uterus normal. Rectal exam shows no mass and guaiac negative stool. Cervix exhibits discharge. Cervix exhibits no motion tenderness. Right adnexum displays no mass and no tenderness. Left adnexum displays no mass and no tenderness.  Genitourinary Comments: Yellow discharge  Musculoskeletal: Normal range of motion.  Lymphadenopathy:       Head (right side): No submental and no submandibular adenopathy present.       Head (left side): No submental and no submandibular adenopathy present.    She has no cervical adenopathy.    She has no axillary adenopathy.       Right: No inguinal and no supraclavicular adenopathy present.         Left: No inguinal and no supraclavicular adenopathy present.  Neurological: She is alert and oriented to person, place, and time. She has normal strength and normal reflexes. No cranial nerve deficit or sensory deficit. Coordination and gait normal.  Decreased left leg strength, not clear if just from pain  Skin: Skin is warm and dry. No lesion and no rash noted.  Psychiatric: She has a normal mood and affect. Her speech is normal and behavior is normal. Judgment and thought content normal. Cognition and memory are normal.          Assessment & Plan:  1.  CPE with pap,HIV, GC/Chlamydia, RPR Schedule mammogram With history of dysplastic colon polyp in 2014, reschedule with GI for repeat this year.   Await whether she will have surgery for left lumbar radiculopathy for certain--being reevaluated soon.  2.  BV: Metronidazole 500 mg twice daily for 7 days.  3.  Hyperlipidemia: FLP  4.  DM Type 2:  A1C

## 2016-05-07 LAB — LIPID PANEL W/O CHOL/HDL RATIO
CHOLESTEROL TOTAL: 214 mg/dL — AB (ref 100–199)
HDL: 38 mg/dL — ABNORMAL LOW (ref >39)
LDL Calculated: 141 mg/dL — ABNORMAL HIGH (ref 0–99)
TRIGLYCERIDES: 174 mg/dL — AB (ref 0–149)
VLDL Cholesterol Cal: 35 mg/dL (ref 5–40)

## 2016-05-07 LAB — HIV ANTIBODY (ROUTINE TESTING W REFLEX): HIV SCREEN 4TH GENERATION: NONREACTIVE

## 2016-05-07 LAB — RPR QUALITATIVE: RPR Ser Ql: NONREACTIVE

## 2016-05-07 LAB — HGB A1C W/O EAG: Hgb A1c MFr Bld: 6.7 % — ABNORMAL HIGH (ref 4.8–5.6)

## 2016-05-09 LAB — CYTOLOGY - PAP

## 2016-05-10 ENCOUNTER — Other Ambulatory Visit: Payer: Self-pay | Admitting: Neurological Surgery

## 2016-05-10 LAB — GC/CHLAMYDIA PROBE AMP
CHLAMYDIA, DNA PROBE: NEGATIVE
NEISSERIA GONORRHOEAE BY PCR: NEGATIVE

## 2016-05-11 NOTE — Addendum Note (Signed)
Addended by: Kary Kos on: 05/11/2016 09:29 AM   Modules accepted: Orders

## 2016-05-24 ENCOUNTER — Encounter (HOSPITAL_COMMUNITY)
Admission: RE | Admit: 2016-05-24 | Discharge: 2016-05-24 | Disposition: A | Discharge status: Home / Self Care | Payer: Medicaid Other | Source: Ambulatory Visit | Attending: Neurological Surgery | Admitting: Neurological Surgery

## 2016-05-24 ENCOUNTER — Other Ambulatory Visit: Payer: Self-pay | Admitting: Neurological Surgery

## 2016-05-24 ENCOUNTER — Encounter (HOSPITAL_COMMUNITY): Payer: Self-pay

## 2016-05-24 ENCOUNTER — Ambulatory Visit (HOSPITAL_COMMUNITY)
Admission: RE | Admit: 2016-05-24 | Discharge: 2016-05-24 | Disposition: A | Discharge status: Home / Self Care | Payer: Medicaid Other | Source: Ambulatory Visit | Attending: Neurosurgery | Admitting: Neurosurgery

## 2016-05-24 DIAGNOSIS — M48061 Spinal stenosis, lumbar region without neurogenic claudication: Secondary | ICD-10-CM | POA: Insufficient documentation

## 2016-05-24 DIAGNOSIS — E119 Type 2 diabetes mellitus without complications: Secondary | ICD-10-CM | POA: Insufficient documentation

## 2016-05-24 HISTORY — DX: Family history of other specified conditions: Z84.89

## 2016-05-24 HISTORY — DX: Personal history of diseases of the blood and blood-forming organs and certain disorders involving the immune mechanism: Z86.2

## 2016-05-24 LAB — PROTIME-INR
INR: 1.07
PROTHROMBIN TIME: 13.9 s (ref 11.4–15.2)

## 2016-05-24 LAB — CBC WITH DIFFERENTIAL/PLATELET
BASOS ABS: 0 10*3/uL (ref 0.0–0.1)
BASOS PCT: 0 %
Eosinophils Absolute: 0.2 10*3/uL (ref 0.0–0.7)
Eosinophils Relative: 3 %
HEMATOCRIT: 35.7 % — AB (ref 36.0–46.0)
HEMOGLOBIN: 11.9 g/dL — AB (ref 12.0–15.0)
Lymphocytes Relative: 56 %
Lymphs Abs: 4 10*3/uL (ref 0.7–4.0)
MCH: 29.3 pg (ref 26.0–34.0)
MCHC: 33.3 g/dL (ref 30.0–36.0)
MCV: 87.9 fL (ref 78.0–100.0)
MONOS PCT: 5 %
Monocytes Absolute: 0.3 10*3/uL (ref 0.1–1.0)
Neutro Abs: 2.6 10*3/uL (ref 1.7–7.7)
Neutrophils Relative %: 36 %
Platelets: 369 10*3/uL (ref 150–400)
RBC: 4.06 MIL/uL (ref 3.87–5.11)
RDW: 13.5 % (ref 11.5–15.5)
WBC: 7.2 10*3/uL (ref 4.0–10.5)

## 2016-05-24 LAB — BASIC METABOLIC PANEL
Anion gap: 11 (ref 5–15)
BUN: 19 mg/dL (ref 6–20)
CALCIUM: 9.9 mg/dL (ref 8.9–10.3)
CO2: 23 mmol/L (ref 22–32)
Chloride: 106 mmol/L (ref 101–111)
Creatinine, Ser: 0.69 mg/dL (ref 0.44–1.00)
GFR calc Af Amer: >60 mL/min (ref >60)
GLUCOSE: 94 mg/dL (ref 65–99)
POTASSIUM: 3 mmol/L — AB (ref 3.5–5.1)
Sodium: 140 mmol/L (ref 135–145)

## 2016-05-24 LAB — GLUCOSE, CAPILLARY: Glucose-Capillary: 149 mg/dL — ABNORMAL HIGH (ref 65–99)

## 2016-05-24 LAB — SURGICAL PCR SCREEN
MRSA, PCR: POSITIVE — AB
STAPHYLOCOCCUS AUREUS: POSITIVE — AB

## 2016-05-24 NOTE — Progress Notes (Signed)
PCP - Dr. Mack Hook Cardiologist - denies  EKG - 05/24/16 CXR - 05/24/16  Echo/Stress test/Cardiac Cath - denies  Patient denies chest pain and shortness of breath at PAT appointment.    Patient states that she does have type II diabetes that is diet controlled.  Patient informed nurse that she does not check her blood sugar at home and she does not have a way to check her blood sugar.  A1C collected at PCP on 05/06/16 - 6.7.

## 2016-05-24 NOTE — Progress Notes (Signed)
I called a prescription for Mupirocin ointment to [facility] , [place], [place], [place].

## 2016-05-24 NOTE — Pre-Procedure Instructions (Signed)
Roberta Clark  05/24/2016      [place], [place] - [address] [facility]. [address] [place] [address] Phone: [phone] Fax: [phone]  [facility] [place], [place] - [address] AT [place] [address] Phone: [phone] Fax: [phone]  [facility] [phone] [place], [place] [address] Phone: [phone] Fax: [place], [place] [address] Phone: [phone] Fax: [phone]  [facility]), [place] - [id number] [place]) [place] [address] Phone: [phone] Fax: [phone]    Your procedure is scheduled on Friday, October 13th, 2017.  Report to [facility] Admitting at 11:45 A.M.   Call this number if you have problems the morning of surgery:  [phone]   Remember:  Do not eat food or drink liquids after midnight.   Take these medicines the morning of surgery with A SIP OF WATER: Fluoxetine (Prozac), Gabapentin (Neurontin).  Stop taking: Ibuprofen, Advil, Motrin, Aspirin, NSAIDS, Naproxen, Aleve, BC's, Goody's, Fish oil, all herbal medications, and all vitamins.    Do not wear jewelry, make-up or nail polish.  Do not wear lotions, powders, or perfumes, or deoderant.  Do not shave 48 hours prior to surgery.    Do not bring valuables to the hospital.  [facility] is not responsible for any belongings or valuables.  Contacts, dentures or bridgework may not be worn into surgery.  Leave your suitcase in the car.  After surgery it may be brought to your room.  For patients admitted to the hospital, discharge time will be determined by your treatment team.  Patients discharged the day of surgery will not  be allowed to drive home.   Special instructions:  Preparing for Surgery.   Please read over the following fact sheets that you were given. MRSA Information    [facility]- Preparing For Surgery  Before surgery, you can play an important role. Because skin is not sterile, your skin needs to be as free of germs as possible. You can reduce the number of germs on your skin by washing with CHG (chlorahexidine gluconate) Soap before surgery.  CHG is an antiseptic cleaner which kills germs and bonds with the skin to continue killing germs even after washing.  Please do not use if you have an allergy to CHG or antibacterial soaps. If your skin becomes reddened/irritated stop using the CHG.  Do not shave (including legs and underarms) for at least 48 hours prior to first CHG shower. It is OK to shave your face.  Please follow these instructions carefully.   1. Shower the NIGHT BEFORE SURGERY and the MORNING OF SURGERY with CHG.   2. If you chose to wash your hair, wash your hair first as usual with your normal shampoo.  3. After you shampoo, rinse your hair and body thoroughly to remove the shampoo.  4. Use CHG as you would any other liquid soap. You can apply CHG directly to the skin and wash gently with a scrungie or a clean washcloth.   5. Apply the CHG Soap to your body ONLY FROM THE NECK DOWN.  Do not use on open wounds or open sores. Avoid contact with your eyes, ears,  mouth and genitals (private parts). Wash genitals (private parts) with your normal soap.  6. Wash thoroughly, paying special attention to the area where your surgery will be performed.  7. Thoroughly rinse your body with warm water from the neck down.  8. DO NOT shower/wash with your normal soap after using and rinsing off the CHG Soap.  9. Pat yourself dry with a CLEAN TOWEL.   10. Wear CLEAN PAJAMAS   11. Place CLEAN SHEETS on your bed the night of your first shower and DO NOT SLEEP WITH PETS.  Day of  Surgery: Do not apply any deodorants/lotions. Please wear clean clothes to the hospital/surgery center.

## 2016-05-26 MED ORDER — CEFAZOLIN SODIUM-DEXTROSE 2-4 GM/100ML-% IV SOLN
2.0000 g | INTRAVENOUS | Status: AC
  Administered 2016-05-27: 2 g via INTRAVENOUS
  Filled 2016-05-26: qty 100

## 2016-05-26 MED ORDER — DEXAMETHASONE SODIUM PHOSPHATE 10 MG/ML IJ SOLN
10.0000 mg | INTRAMUSCULAR | Status: DC
  Filled 2016-05-26: qty 1

## 2016-05-27 ENCOUNTER — Ambulatory Visit (HOSPITAL_COMMUNITY): Payer: Medicaid Other

## 2016-05-27 ENCOUNTER — Encounter (HOSPITAL_COMMUNITY)
Admission: RE | Disposition: A | Discharge status: Home / Self Care | Payer: Self-pay | Source: Ambulatory Visit | Attending: Neurological Surgery

## 2016-05-27 ENCOUNTER — Ambulatory Visit (HOSPITAL_COMMUNITY)
Admission: RE | Admit: 2016-05-27 | Discharge: 2016-05-30 | Disposition: A | Discharge status: Home / Self Care | Payer: Medicaid Other | Source: Ambulatory Visit | Attending: Neurological Surgery | Admitting: Neurological Surgery

## 2016-05-27 ENCOUNTER — Ambulatory Visit (HOSPITAL_COMMUNITY): Payer: Medicaid Other | Admitting: Anesthesiology

## 2016-05-27 ENCOUNTER — Encounter (HOSPITAL_COMMUNITY): Payer: Self-pay | Admitting: *Deleted

## 2016-05-27 DIAGNOSIS — G4700 Insomnia, unspecified: Secondary | ICD-10-CM | POA: Insufficient documentation

## 2016-05-27 DIAGNOSIS — F17210 Nicotine dependence, cigarettes, uncomplicated: Secondary | ICD-10-CM | POA: Diagnosis not present

## 2016-05-27 DIAGNOSIS — E785 Hyperlipidemia, unspecified: Secondary | ICD-10-CM | POA: Diagnosis not present

## 2016-05-27 DIAGNOSIS — E119 Type 2 diabetes mellitus without complications: Secondary | ICD-10-CM | POA: Diagnosis not present

## 2016-05-27 DIAGNOSIS — M48061 Spinal stenosis, lumbar region without neurogenic claudication: Secondary | ICD-10-CM | POA: Diagnosis present

## 2016-05-27 DIAGNOSIS — Z833 Family history of diabetes mellitus: Secondary | ICD-10-CM | POA: Insufficient documentation

## 2016-05-27 DIAGNOSIS — Z8249 Family history of ischemic heart disease and other diseases of the circulatory system: Secondary | ICD-10-CM | POA: Diagnosis not present

## 2016-05-27 DIAGNOSIS — F329 Major depressive disorder, single episode, unspecified: Secondary | ICD-10-CM | POA: Insufficient documentation

## 2016-05-27 DIAGNOSIS — I10 Essential (primary) hypertension: Secondary | ICD-10-CM | POA: Insufficient documentation

## 2016-05-27 DIAGNOSIS — Z419 Encounter for procedure for purposes other than remedying health state, unspecified: Secondary | ICD-10-CM

## 2016-05-27 DIAGNOSIS — Z8261 Family history of arthritis: Secondary | ICD-10-CM | POA: Insufficient documentation

## 2016-05-27 DIAGNOSIS — Z9889 Other specified postprocedural states: Secondary | ICD-10-CM

## 2016-05-27 DIAGNOSIS — Z888 Allergy status to other drugs, medicaments and biological substances status: Secondary | ICD-10-CM | POA: Diagnosis not present

## 2016-05-27 HISTORY — PX: LUMBAR LAMINECTOMY/DECOMPRESSION MICRODISCECTOMY: SHX5026

## 2016-05-27 LAB — GLUCOSE, CAPILLARY
GLUCOSE-CAPILLARY: 120 mg/dL — AB (ref 65–99)
GLUCOSE-CAPILLARY: 95 mg/dL (ref 65–99)
Glucose-Capillary: 126 mg/dL — ABNORMAL HIGH (ref 65–99)

## 2016-05-27 SURGERY — LUMBAR LAMINECTOMY/DECOMPRESSION MICRODISCECTOMY 1 LEVEL
Anesthesia: General | Site: Spine Lumbar | Laterality: Bilateral

## 2016-05-27 MED ORDER — SODIUM CHLORIDE 0.9% FLUSH: 3.0000 mL | INTRAVENOUS | Status: DC | PRN

## 2016-05-27 MED ORDER — AMITRIPTYLINE HCL 25 MG PO TABS
25.0000 mg | ORAL_TABLET | Freq: Every evening | ORAL | Status: DC | PRN
  Administered 2016-05-27 – 2016-05-29 (×2): 25 mg via ORAL
  Filled 2016-05-27 (×2): qty 1

## 2016-05-27 MED ORDER — MENTHOL 3 MG MT LOZG: 1.0000 | LOZENGE | OROMUCOSAL | Status: DC | PRN

## 2016-05-27 MED ORDER — LIDOCAINE HCL (CARDIAC) 20 MG/ML IV SOLN
INTRAVENOUS | Status: DC | PRN
  Administered 2016-05-27: 80 mg via INTRAVENOUS

## 2016-05-27 MED ORDER — ONDANSETRON HCL 4 MG/2ML IJ SOLN
INTRAMUSCULAR | Status: DC | PRN
  Administered 2016-05-27: 4 mg via INTRAVENOUS

## 2016-05-27 MED ORDER — LACTATED RINGERS IV SOLN
INTRAVENOUS | Status: DC
Start: 2016-05-27 — End: 2016-05-27
  Administered 2016-05-27: 13:00:00 via INTRAVENOUS

## 2016-05-27 MED ORDER — MORPHINE SULFATE (PF) 2 MG/ML IV SOLN
1.0000 mg | INTRAVENOUS | Status: DC | PRN
  Administered 2016-05-27 – 2016-05-29 (×2): 2 mg via INTRAVENOUS
  Filled 2016-05-27 (×2): qty 1

## 2016-05-27 MED ORDER — CHLORHEXIDINE GLUCONATE CLOTH 2 % EX PADS: 6.0000 | MEDICATED_PAD | Freq: Once | CUTANEOUS | Status: DC

## 2016-05-27 MED ORDER — LISINOPRIL-HYDROCHLOROTHIAZIDE 20-12.5 MG PO TABS: 1.0000 | ORAL_TABLET | Freq: Every day | ORAL | Status: DC

## 2016-05-27 MED ORDER — THROMBIN 5000 UNITS EX SOLR
CUTANEOUS | Status: DC | PRN
  Administered 2016-05-27 (×2): 5000 [IU] via TOPICAL

## 2016-05-27 MED ORDER — CEFAZOLIN IN D5W 1 GM/50ML IV SOLN
1.0000 g | Freq: Three times a day (TID) | INTRAVENOUS | Status: AC
  Administered 2016-05-27: 1 g via INTRAVENOUS
  Filled 2016-05-27 (×2): qty 50

## 2016-05-27 MED ORDER — SODIUM CHLORIDE 0.9% FLUSH
3.0000 mL | Freq: Two times a day (BID) | INTRAVENOUS | Status: DC
  Administered 2016-05-28 – 2016-05-30 (×4): 3 mL via INTRAVENOUS

## 2016-05-27 MED ORDER — FLUOXETINE HCL 20 MG PO CAPS
20.0000 mg | ORAL_CAPSULE | Freq: Every day | ORAL | Status: DC
  Administered 2016-05-27 – 2016-05-30 (×4): 20 mg via ORAL
  Filled 2016-05-27 (×4): qty 1

## 2016-05-27 MED ORDER — HYDROCODONE-ACETAMINOPHEN 5-325 MG PO TABS
1.0000 | ORAL_TABLET | ORAL | Status: DC | PRN
  Administered 2016-05-27 – 2016-05-30 (×11): 2 via ORAL
  Filled 2016-05-27 (×11): qty 2

## 2016-05-27 MED ORDER — CYCLOBENZAPRINE HCL 10 MG PO TABS
10.0000 mg | ORAL_TABLET | Freq: Three times a day (TID) | ORAL | Status: DC | PRN
  Administered 2016-05-27 – 2016-05-29 (×5): 10 mg via ORAL
  Filled 2016-05-27 (×6): qty 1

## 2016-05-27 MED ORDER — ROCURONIUM BROMIDE 100 MG/10ML IV SOLN
INTRAVENOUS | Status: DC | PRN
  Administered 2016-05-27: 50 mg via INTRAVENOUS

## 2016-05-27 MED ORDER — SODIUM CHLORIDE 0.9 % IV SOLN: 250.0000 mL | INTRAVENOUS | Status: DC

## 2016-05-27 MED ORDER — PHENOL 1.4 % MT LIQD: 1.0000 | OROMUCOSAL | Status: DC | PRN

## 2016-05-27 MED ORDER — FENTANYL CITRATE (PF) 100 MCG/2ML IJ SOLN
INTRAMUSCULAR | Status: AC
Start: 2016-05-27 — End: 2016-05-27
  Filled 2016-05-27: qty 4

## 2016-05-27 MED ORDER — THROMBIN 5000 UNITS EX SOLR
CUTANEOUS | Status: AC
  Filled 2016-05-27: qty 15000

## 2016-05-27 MED ORDER — HEMOSTATIC AGENTS (NO CHARGE) OPTIME
TOPICAL | Status: DC | PRN
  Administered 2016-05-27: 1 via TOPICAL

## 2016-05-27 MED ORDER — MIDAZOLAM HCL 2 MG/2ML IJ SOLN
INTRAMUSCULAR | Status: AC
  Filled 2016-05-27: qty 2

## 2016-05-27 MED ORDER — SODIUM CHLORIDE 0.9 % IR SOLN
Status: DC | PRN
  Administered 2016-05-27: 15:00:00

## 2016-05-27 MED ORDER — CELECOXIB 200 MG PO CAPS
200.0000 mg | ORAL_CAPSULE | Freq: Two times a day (BID) | ORAL | Status: DC
Start: 2016-05-27 — End: 2016-05-30
  Administered 2016-05-27 – 2016-05-30 (×6): 200 mg via ORAL
  Filled 2016-05-27 (×6): qty 1

## 2016-05-27 MED ORDER — PROPOFOL 10 MG/ML IV BOLUS
INTRAVENOUS | Status: AC
  Filled 2016-05-27: qty 20

## 2016-05-27 MED ORDER — HYDROMORPHONE HCL 1 MG/ML IJ SOLN
INTRAMUSCULAR | Status: AC
  Filled 2016-05-27: qty 1

## 2016-05-27 MED ORDER — EPHEDRINE SULFATE 50 MG/ML IJ SOLN
INTRAMUSCULAR | Status: DC | PRN
  Administered 2016-05-27: 5 mg via INTRAVENOUS

## 2016-05-27 MED ORDER — LACTATED RINGERS IV SOLN
INTRAVENOUS | Status: DC | PRN
  Administered 2016-05-27 (×2): via INTRAVENOUS

## 2016-05-27 MED ORDER — BUPIVACAINE HCL (PF) 0.25 % IJ SOLN
INTRAMUSCULAR | Status: AC
  Filled 2016-05-27: qty 30

## 2016-05-27 MED ORDER — PROMETHAZINE HCL 25 MG/ML IJ SOLN: 6.2500 mg | INTRAMUSCULAR | Status: DC | PRN

## 2016-05-27 MED ORDER — POTASSIUM CHLORIDE IN NACL 20-0.9 MEQ/L-% IV SOLN
INTRAVENOUS | Status: DC
  Administered 2016-05-28: 08:00:00 via INTRAVENOUS
  Filled 2016-05-27 (×3): qty 1000

## 2016-05-27 MED ORDER — GABAPENTIN 300 MG PO CAPS
300.0000 mg | ORAL_CAPSULE | Freq: Three times a day (TID) | ORAL | Status: DC
  Administered 2016-05-27 – 2016-05-30 (×9): 300 mg via ORAL
  Filled 2016-05-27 (×9): qty 1

## 2016-05-27 MED ORDER — THROMBIN 5000 UNITS EX SOLR
OROMUCOSAL | Status: DC | PRN
  Administered 2016-05-27: 16:00:00 via TOPICAL

## 2016-05-27 MED ORDER — SUGAMMADEX SODIUM 200 MG/2ML IV SOLN
INTRAVENOUS | Status: AC
  Filled 2016-05-27: qty 2

## 2016-05-27 MED ORDER — SUGAMMADEX SODIUM 200 MG/2ML IV SOLN
INTRAVENOUS | Status: DC | PRN
  Administered 2016-05-27: 100 mg via INTRAVENOUS

## 2016-05-27 MED ORDER — ARTIFICIAL TEARS OP OINT
TOPICAL_OINTMENT | OPHTHALMIC | Status: DC | PRN
  Administered 2016-05-27: 1 via OPHTHALMIC

## 2016-05-27 MED ORDER — DIAZEPAM 5 MG/ML IJ SOLN
1.0000 mg | Freq: Once | INTRAMUSCULAR | Status: AC | PRN
  Administered 2016-05-27: 1.5 mg via INTRAVENOUS

## 2016-05-27 MED ORDER — ACETAMINOPHEN 650 MG RE SUPP: 650.0000 mg | RECTAL | Status: DC | PRN

## 2016-05-27 MED ORDER — HYDROMORPHONE HCL 1 MG/ML IJ SOLN
0.2500 mg | INTRAMUSCULAR | Status: DC | PRN
  Administered 2016-05-27: 0.5 mg via INTRAVENOUS

## 2016-05-27 MED ORDER — ONDANSETRON HCL 4 MG/2ML IJ SOLN: 4.0000 mg | INTRAMUSCULAR | Status: DC | PRN

## 2016-05-27 MED ORDER — CHLORHEXIDINE GLUCONATE CLOTH 2 % EX PADS
6.0000 | MEDICATED_PAD | Freq: Every day | CUTANEOUS | Status: DC
  Administered 2016-05-28 – 2016-05-30 (×3): 6 via TOPICAL

## 2016-05-27 MED ORDER — FENTANYL CITRATE (PF) 100 MCG/2ML IJ SOLN
INTRAMUSCULAR | Status: DC | PRN
  Administered 2016-05-27: 50 ug via INTRAVENOUS
  Administered 2016-05-27 (×2): 100 ug via INTRAVENOUS

## 2016-05-27 MED ORDER — DIAZEPAM 5 MG/ML IJ SOLN
INTRAMUSCULAR | Status: AC
  Filled 2016-05-27: qty 2

## 2016-05-27 MED ORDER — MUPIROCIN 2 % EX OINT
1.0000 "application " | TOPICAL_OINTMENT | Freq: Two times a day (BID) | CUTANEOUS | Status: DC
  Administered 2016-05-27 – 2016-05-30 (×6): 1 via NASAL
  Filled 2016-05-27: qty 22

## 2016-05-27 MED ORDER — ACETAMINOPHEN 325 MG PO TABS: 650.0000 mg | ORAL_TABLET | ORAL | Status: DC | PRN

## 2016-05-27 MED ORDER — HYDROCHLOROTHIAZIDE 12.5 MG PO CAPS
12.5000 mg | ORAL_CAPSULE | Freq: Every day | ORAL | Status: DC
  Filled 2016-05-27 (×4): qty 1

## 2016-05-27 MED ORDER — BUPIVACAINE HCL (PF) 0.25 % IJ SOLN
INTRAMUSCULAR | Status: DC | PRN
  Administered 2016-05-27: 8 mL

## 2016-05-27 MED ORDER — PROPOFOL 10 MG/ML IV BOLUS
INTRAVENOUS | Status: DC | PRN
  Administered 2016-05-27: 175 mg via INTRAVENOUS

## 2016-05-27 MED ORDER — MUPIROCIN 2 % EX OINT
TOPICAL_OINTMENT | CUTANEOUS | Status: AC
  Administered 2016-05-27: 13:00:00
  Filled 2016-05-27: qty 22

## 2016-05-27 MED ORDER — HYDROCODONE-ACETAMINOPHEN 5-325 MG PO TABS
ORAL_TABLET | ORAL | Status: AC
  Filled 2016-05-27: qty 2

## 2016-05-27 MED ORDER — MIDAZOLAM HCL 5 MG/5ML IJ SOLN
INTRAMUSCULAR | Status: DC | PRN
  Administered 2016-05-27: 2 mg via INTRAVENOUS

## 2016-05-27 MED ORDER — LISINOPRIL 20 MG PO TABS
20.0000 mg | ORAL_TABLET | Freq: Every day | ORAL | Status: DC
  Administered 2016-05-27 – 2016-05-29 (×3): 20 mg via ORAL
  Filled 2016-05-27 (×4): qty 1

## 2016-05-27 SURGICAL SUPPLY — 43 items
BAG DECANTER FOR FLEXI CONT (MISCELLANEOUS) ×3 IMPLANT
BENZOIN TINCTURE PRP APPL 2/3 (GAUZE/BANDAGES/DRESSINGS) ×3 IMPLANT
BUR MATCHSTICK NEURO 3.0 LAGG (BURR) ×3 IMPLANT
CANISTER SUCT 3000ML PPV (MISCELLANEOUS) ×3 IMPLANT
CLOSURE WOUND 1/2 X4 (GAUZE/BANDAGES/DRESSINGS) ×1
DRAPE LAPAROTOMY 100X72X124 (DRAPES) ×3 IMPLANT
DRAPE MICROSCOPE LEICA (MISCELLANEOUS) IMPLANT
DRAPE POUCH INSTRU U-SHP 10X18 (DRAPES) ×3 IMPLANT
DRAPE SURG 17X23 STRL (DRAPES) ×3 IMPLANT
DRSG OPSITE POSTOP 4X6 (GAUZE/BANDAGES/DRESSINGS) ×3 IMPLANT
DURAPREP 26ML APPLICATOR (WOUND CARE) ×3 IMPLANT
ELECT REM PT RETURN 9FT ADLT (ELECTROSURGICAL) ×3
ELECTRODE REM PT RTRN 9FT ADLT (ELECTROSURGICAL) ×1 IMPLANT
GAUZE SPONGE 4X4 16PLY XRAY LF (GAUZE/BANDAGES/DRESSINGS) IMPLANT
GLOVE BIO SURGEON STRL SZ8 (GLOVE) ×6 IMPLANT
GLOVE BIOGEL PI IND STRL 7.0 (GLOVE) ×2 IMPLANT
GLOVE BIOGEL PI IND STRL 7.5 (GLOVE) ×3 IMPLANT
GLOVE BIOGEL PI INDICATOR 7.0 (GLOVE) ×4
GLOVE BIOGEL PI INDICATOR 7.5 (GLOVE) ×6
GLOVE ECLIPSE 6.5 STRL STRAW (GLOVE) ×3 IMPLANT
GLOVE ECLIPSE 8.0 STRL XLNG CF (GLOVE) ×3 IMPLANT
GLOVE SURG SS PI 7.0 STRL IVOR (GLOVE) ×3 IMPLANT
GOWN STRL REUS W/ TWL LRG LVL3 (GOWN DISPOSABLE) ×2 IMPLANT
GOWN STRL REUS W/ TWL XL LVL3 (GOWN DISPOSABLE) ×1 IMPLANT
GOWN STRL REUS W/TWL LRG LVL3 (GOWN DISPOSABLE) ×4
GOWN STRL REUS W/TWL XL LVL3 (GOWN DISPOSABLE) ×2
HEMOSTAT POWDER KIT SURGIFOAM (HEMOSTASIS) ×3 IMPLANT
KIT BASIN OR (CUSTOM PROCEDURE TRAY) ×3 IMPLANT
KIT ROOM TURNOVER OR (KITS) ×3 IMPLANT
NEEDLE HYPO 25X1 1.5 SAFETY (NEEDLE) ×3 IMPLANT
NEEDLE SPNL 20GX3.5 QUINCKE YW (NEEDLE) ×3 IMPLANT
NS IRRIG 1000ML POUR BTL (IV SOLUTION) ×3 IMPLANT
PACK LAMINECTOMY NEURO (CUSTOM PROCEDURE TRAY) ×3 IMPLANT
PAD ARMBOARD 7.5X6 YLW CONV (MISCELLANEOUS) ×9 IMPLANT
SPONGE SURGIFOAM ABS GEL SZ50 (HEMOSTASIS) ×3 IMPLANT
STRIP CLOSURE SKIN 1/2X4 (GAUZE/BANDAGES/DRESSINGS) ×2 IMPLANT
SUT VIC AB 0 CT1 18XCR BRD8 (SUTURE) ×1 IMPLANT
SUT VIC AB 0 CT1 8-18 (SUTURE) ×2
SUT VIC AB 2-0 CP2 18 (SUTURE) ×3 IMPLANT
SUT VIC AB 3-0 SH 8-18 (SUTURE) ×3 IMPLANT
TOWEL OR 17X24 6PK STRL BLUE (TOWEL DISPOSABLE) ×3 IMPLANT
TOWEL OR 17X26 10 PK STRL BLUE (TOWEL DISPOSABLE) ×3 IMPLANT
WATER STERILE IRR 1000ML POUR (IV SOLUTION) ×3 IMPLANT

## 2016-05-27 NOTE — Anesthesia Preprocedure Evaluation (Addendum)
Anesthesia Evaluation  Patient identified by MRN, date of birth, ID band Patient awake    Reviewed: Allergy & Precautions, NPO status , Patient's Chart, lab work & pertinent test results  History of Anesthesia Complications (+) Family history of anesthesia reaction  Airway Mallampati: II  TM Distance: >3 FB Neck ROM: Full    Dental no notable dental hx. (+) Teeth Intact, Dental Advidsory Given   Pulmonary neg pulmonary ROS, Current Smoker,    Pulmonary exam normal breath sounds clear to auscultation       Cardiovascular hypertension, Normal cardiovascular exam Rhythm:Regular Rate:Normal     Neuro/Psych PSYCHIATRIC DISORDERS Depression  Neuromuscular disease    GI/Hepatic negative GI ROS, Neg liver ROS,   Endo/Other  diabetes, Well Controlled  Renal/GU negative Renal ROS  negative genitourinary   Musculoskeletal negative musculoskeletal ROS (+)   Abdominal   Peds negative pediatric ROS (+)  Hematology negative hematology ROS (+)   Anesthesia Other Findings   Reproductive/Obstetrics negative OB ROS                            Anesthesia Physical Anesthesia Plan  ASA: II  Anesthesia Plan: General   Post-op Pain Management:    Induction: Intravenous  Airway Management Planned: Oral ETT  Additional Equipment:   Intra-op Plan:   Post-operative Plan: Extubation in OR  Informed Consent: I have reviewed the patients History and Physical, chart, labs and discussed the procedure including the risks, benefits and alternatives for the proposed anesthesia with the patient or authorized representative who has indicated his/her understanding and acceptance.   Dental advisory given and Dental Advisory Given  Plan Discussed with: CRNA, Anesthesiologist and Surgeon  Anesthesia Plan Comments:        Anesthesia Quick Evaluation

## 2016-05-27 NOTE — Anesthesia Procedure Notes (Signed)
Procedure Name: Intubation Date/Time: 05/27/2016 2:43 PM Performed by: Neldon Newport Pre-anesthesia Checklist: Timeout performed, Patient being monitored, Suction available, Emergency Drugs available and Patient identified Patient Re-evaluated:Patient Re-evaluated prior to inductionOxygen Delivery Method: Circle system utilized Preoxygenation: Pre-oxygenation with 100% oxygen Intubation Type: IV induction Ventilation: Mask ventilation without difficulty Laryngoscope Size: Mac and 3 Grade View: Grade II Tube type: Oral Tube size: 7.0 mm Number of attempts: 1 Placement Confirmation: breath sounds checked- equal and bilateral,  positive ETCO2 and ETT inserted through vocal cords under direct vision Secured at: 21 cm Tube secured with: Tape Dental Injury: Teeth and Oropharynx as per pre-operative assessment

## 2016-05-27 NOTE — Transfer of Care (Signed)
Immediate Anesthesia Transfer of Care Note  Patient: Roberta Clark  Procedure(s) Performed: Procedure(s): Left Lumbar Four-Five Laminectomy and Foraminotomy (Bilateral)  Patient Location: PACU  Anesthesia Type:General  Level of Consciousness: awake, alert  and oriented  Airway & Oxygen Therapy: Patient Spontanous Breathing and Patient connected to nasal cannula oxygen  Post-op Assessment: Report given to RN, Post -op Vital signs reviewed and stable, Patient moving all extremities and Patient moving all extremities X 4  Post vital signs: Reviewed and stable  Last Vitals:  Vitals:   05/27/16 1126  BP: 118/86  Pulse: 80  Resp: 18  Temp: 36.7 C    Last Pain:  Vitals:   05/27/16 1309  TempSrc:   PainSc: 10-Worst pain ever      Patients Stated Pain Goal: 2 ([id number])  Complications: No apparent anesthesia complications

## 2016-05-27 NOTE — CV Procedure (Signed)
05/27/2016  3:53 PM  PATIENT:  Roberta Clark  55 y.o. female  PRE-OPERATIVE DIAGNOSIS:  Lumbar spinal stenosis L4-5 with back and bilateral leg pain  POST-OPERATIVE DIAGNOSIS:  Same  PROCEDURE:  Bilateral decompressive lumbar hemilaminectomy and medial facetectomy and foraminotomy L4-5 for decompression of the L5 nerve roots and the central canal  SURGEON:  Sherley Bounds, MD  ASSISTANTS: Dr. Cyndy Freeze  ANESTHESIA:   General  EBL: 15 ml  Total I/O In: 1000 [I.V.:1000] Out: 15 [Blood:15]  BLOOD ADMINISTERED:none  DRAINS: None   SPECIMEN:  No Specimen  INDICATION FOR PROCEDURE: This patient presented with back and bilateral leg pain. MRI showed spinal stenosis at L4-5. She tried medical management without relief. Her pain was debilitating. I recommended decompression at L4-5. Patient understood the risks, benefits, and alternatives and potential outcomes and wished to proceed.  PROCEDURE DETAILS: The patient was taken to the operating room and after induction of adequate generalized endotracheal anesthesia, the patient was rolled into the prone position on the Wilson frame and all pressure points were padded. The lumbar region was cleaned and then prepped with DuraPrep and draped in the usual sterile fashion. 5 cc of local anesthesia was injected and then a dorsal midline incision was made and carried down to the lumbo sacral fascia. The fascia was opened and the paraspinous musculature was taken down in a subperiosteal fashion to expose L4-5 bilaterally. Intraoperative x-ray confirmed my level, and then I used a combination of the high-speed drill and the Kerrison punches to perform a hemilaminectomy, medial facetectomy, and foraminotomy at L4-5 bilaterally. The underlying yellow ligament was opened and removed in a piecemeal fashion to expose the underlying dura and exiting nerve root. I undercut the lateral recess and dissected down until I was medial to and distal to the pedicle.  I then  palpated with a coronary dilator along the nerve root and into the foramen to assure adequate decompression. I felt no more compression of the nerve root. I irrigated with saline solution containing bacitracin. Achieved hemostasis with bipolar cautery, lined the dura with Gelfoam, and then closed the fascia with 0 Vicryl. I closed the subcutaneous tissues with 2-0 Vicryl and the subcuticular tissues with 3-0 Vicryl. The skin was then closed with benzoin and Steri-Strips. The drapes were removed, a sterile dressing was applied. The patient was awakened from general anesthesia and transferred to the recovery room in stable condition. At the end of the procedure all sponge, needle and instrument counts were correct.   PLAN OF CARE: Admit for overnight observation  PATIENT DISPOSITION:  PACU - hemodynamically stable.   Delay start of Pharmacological VTE agent (>24hrs) due to surgical blood loss or risk of bleeding:  yes

## 2016-05-27 NOTE — H&P (Signed)
Subjective: Patient is a 55 y.o. female admitted for lumbar laminectomy for stenosis L4-5. Onset of symptoms was several months ago, gradually worsening since that time.  The pain is rated severe, and is located at the across the lower back and radiates to her legs. The pain is described as aching and occurs all day. The symptoms have been progressive. Symptoms are exacerbated by exercise. MRI or CT showed lumbar spinal stenosis L4-5   Past Medical History:  Diagnosis Date  . Chronic pain   . Depression   . Diabetes mellitus without complication (HCC)    Type II - diet controlled  . Family history of adverse reaction to anesthesia    mother - nausea  . History of anemia   . Hyperlipidemia   . Hypertension   . Insomnia secondary to chronic pain   . Low back pain with left-sided sciatica    Reportedly with lumbar spinal stenosis    Past Surgical History:  Procedure Laterality Date  . COLONOSCOPY    . TUBAL LIGATION  1985    Prior to Admission medications   Medication Sig Start Date End Date Taking? Authorizing Provider  amitriptyline (ELAVIL) 25 MG tablet Take 1 tablet (25 mg total) by mouth at bedtime as needed for sleep (sleep). 10/05/15  Yes Mack Hook, MD  cyclobenzaprine (FLEXERIL) 10 MG tablet TAKE ONE TABLET BY MOUTH AT NIGHT ONLY AS NEEDED 05/06/16  Yes Mack Hook, MD  FLUoxetine (PROZAC) 20 MG capsule Take 1 capsule (20 mg total) by mouth daily. 10/05/15  Yes Mack Hook, MD  gabapentin (NEURONTIN) 300 MG capsule Take 1 capsule (300 mg total) by mouth 3 (three) times daily. Take two capsules in the AM, two at lunch, and 4 at bedtime. Patient taking differently: Take 600-1,200 mg by mouth 3 (three) times daily. Take two capsules in the AM, two at lunch, and 4 at bedtime. 02/02/16  Yes Mack Hook, MD  gemfibrozil (LOPID) 600 MG tablet 1 tab by mouth twice daily with meals 01/05/16  Yes Mack Hook, MD  ibuprofen (ADVIL,MOTRIN) 800 MG tablet Take  800 mg by mouth 2 (two) times daily as needed for moderate pain.  04/24/15  Yes Historical Provider, MD  lisinopril-hydrochlorothiazide (PRINZIDE,ZESTORETIC) 20-12.5 MG tablet Take 1 tablet by mouth daily. 11/09/15  Yes Mack Hook, MD  Multiple Vitamins-Minerals (CENTRUM SILVER 50+WOMEN) TABS Take 1 tablet by mouth daily.   Yes Historical Provider, MD  Naproxen Sod-Diphenhydramine (ALEVE PM) 220-25 MG TABS Take 2 tablets by mouth at bedtime.    Yes Historical Provider, MD  Omega-3 Fatty Acids (FISH OIL) 1000 MG CAPS Take 1,000 mg by mouth 3 (three) times daily.    Yes Historical Provider, MD  potassium chloride (K-DUR,KLOR-CON) 10 MEQ tablet 1 tab by mouth daily 01/05/16  Yes Mack Hook, MD  metroNIDAZOLE (FLAGYL) 500 MG tablet 1 tab by mouth twice daily for 7 days. Patient not taking: Reported on 05/23/2016 05/06/16   Mack Hook, MD   Allergies  Allergen Reactions  . Lipitor [Atorvastatin] Rash    Social History  Substance Use Topics  . Smoking status: Current Some Day Smoker    Packs/day: 0.05    Types: Cigarettes    Start date: 12/17/1976  . Smokeless tobacco: Never Used     Comment: 3 cigs per day 05/24/16  . Alcohol use No    Family History  Problem Relation Age of Onset  . Heart disease Mother 32    Hx of MI   . Hypertension Mother   .  Diabetes Mother   . Diabetes Sister   . Graves' disease Sister   . Lupus Father   . Graves' disease Brother   . Diabetes Brother   . Arthritis Daughter     Not clear if RA  . Hypertension Daughter   . Migraines Daughter   . Epilepsy Son     Started 45 months of age after bacterial meningitis  . GER disease Sister      Review of Systems  Positive ROS: Negative  All other systems have been reviewed and were otherwise negative with the exception of those mentioned in the HPI and as above.  Objective: Vital signs in last 24 hours: Temp:  [98.1 F (36.7 C)] 98.1 F (36.7 C) (10/13 1126) Pulse Rate:  [80] 80 (10/13  1126) Resp:  [18] 18 (10/13 1126) BP: (118)/(86) 118/86 (10/13 1126) SpO2:  [100 %] 100 % (10/13 1126) Weight:  [52.6 kg (116 lb)] 52.6 kg (116 lb) (10/13 1126)  General Appearance: Alert, cooperative, no distress, appears stated age Head: Normocephalic, without obvious abnormality, atraumatic Eyes: PERRL, conjunctiva/corneas clear, EOM's intact    Neck: Supple, symmetrical, trachea midline Back: Symmetric, no curvature, ROM normal, no CVA tenderness Lungs:  respirations unlabored Heart: Regular rate and rhythm Abdomen: Soft, non-tender Extremities: Extremities normal, atraumatic, no cyanosis or edema Pulses: 2+ and symmetric all extremities Skin: Skin color, texture, turgor normal, no rashes or lesions  NEUROLOGIC:   Mental status: Alert and oriented x4,  no aphasia, good attention span, fund of knowledge, and memory Motor Exam - grossly normal Sensory Exam - grossly normal Reflexes: 1+ Coordination - grossly normal Gait - grossly normal Balance - grossly normal Cranial Nerves: I: smell Not tested  II: visual acuity  OS: nl    OD: nl  II: visual fields Full to confrontation  II: pupils Equal, round, reactive to light  III,VII: ptosis None  III,IV,VI: extraocular muscles  Full ROM  V: mastication Normal  V: facial light touch sensation  Normal  V,VII: corneal reflex  Present  VII: facial muscle function - upper  Normal  VII: facial muscle function - lower Normal  VIII: hearing Not tested  IX: soft palate elevation  Normal  IX,X: gag reflex Present  XI: trapezius strength  5/5  XI: sternocleidomastoid strength 5/5  XI: neck flexion strength  5/5  XII: tongue strength  Normal    Data Review Lab Results  Component Value Date   WBC 7.2 05/24/2016   HGB 11.9 (L) 05/24/2016   HCT 35.7 (L) 05/24/2016   MCV 87.9 05/24/2016   PLT 369 05/24/2016   Lab Results  Component Value Date   NA 140 05/24/2016   K 3.0 (L) 05/24/2016   CL 106 05/24/2016   CO2 23 05/24/2016    BUN 19 05/24/2016   CREATININE 0.69 05/24/2016   GLUCOSE 94 05/24/2016   Lab Results  Component Value Date   INR 1.07 05/24/2016    Assessment/Plan: Patient admitted for Decompressive laminectomy L4-5 for stenosis. Patient has failed a reasonable attempt at conservative therapy.  I explained the condition and procedure to the patient and answered any questions.  Patient wishes to proceed with procedure as planned. Understands risks/ benefits and typical outcomes of procedure.   JONES,DAVID S 05/27/2016 2:07 PM

## 2016-05-27 NOTE — Anesthesia Postprocedure Evaluation (Signed)
Anesthesia Post Note  Patient: Roberta Clark  Procedure(s) Performed: Procedure(s) (LRB): Left Lumbar Four-Five Laminectomy and Foraminotomy (Bilateral)  Patient location during evaluation: PACU Anesthesia Type: General Level of consciousness: awake and alert Pain management: pain level controlled Vital Signs Assessment: post-procedure vital signs reviewed and stable Respiratory status: spontaneous breathing, nonlabored ventilation, respiratory function stable and patient connected to nasal cannula oxygen Cardiovascular status: blood pressure returned to baseline and stable Postop Assessment: no signs of nausea or vomiting Anesthetic complications: no    Last Vitals:  Vitals:   05/27/16 1126 05/27/16 1603  BP: 118/86   Pulse: 80   Resp: 18   Temp: 36.7 C 36.5 C    Last Pain:  Vitals:   05/27/16 1615  TempSrc:   PainSc: 7                  Exzavier Ruderman S

## 2016-05-28 DIAGNOSIS — M48061 Spinal stenosis, lumbar region without neurogenic claudication: Secondary | ICD-10-CM | POA: Diagnosis not present

## 2016-05-28 LAB — GLUCOSE, CAPILLARY
GLUCOSE-CAPILLARY: 131 mg/dL — AB (ref 65–99)
GLUCOSE-CAPILLARY: 155 mg/dL — AB (ref 65–99)
Glucose-Capillary: 97 mg/dL (ref 65–99)

## 2016-05-28 MED ORDER — CEFAZOLIN IN D5W 1 GM/50ML IV SOLN
1.0000 g | Freq: Once | INTRAVENOUS | Status: AC
  Administered 2016-05-28: 1 g via INTRAVENOUS
  Filled 2016-05-28: qty 50

## 2016-05-28 NOTE — Progress Notes (Signed)
No issues overnight. Reports appropriate back pain, minimal left hip/leg pain.  EXAM:  BP (!) 93/55 (BP Location: Left Arm)   Pulse 80   Temp 98.9 F (37.2 C) (Oral)   Resp 18   Ht 5\' 4"  (1.626 m)   Wt 52.6 kg (116 lb)   SpO2 98%   BMI 19.91 kg/m   Awake, alert, oriented  Speech fluent, appropriate  CN grossly intact  5/5 BUE/BLE   IMPRESSION:  55 y.o. female s/p lumbar decompression, doing well  PLAN: - Cont to ambulate today - Likely d/c home tomorrow

## 2016-05-28 NOTE — Plan of Care (Signed)
Problem: Pain Management: Goal: Pain level will decrease Outcome: Progressing Med for pain/spasms. Roberta Clark we'd make sure she was low pain/spasms before getting OOB.  Ice to back.

## 2016-05-28 NOTE — Progress Notes (Signed)
Walked in halls twice this shift,and sat in chair twice, tolerated well. Stable post op of yesterday.

## 2016-05-28 NOTE — Plan of Care (Signed)
Problem: Skin Integrity: Goal: Signs of wound healing will improve Outcome: Progressing Dressing clean/dry/intact.

## 2016-05-29 ENCOUNTER — Encounter (HOSPITAL_COMMUNITY): Payer: Self-pay | Admitting: Neurological Surgery

## 2016-05-29 DIAGNOSIS — M48061 Spinal stenosis, lumbar region without neurogenic claudication: Secondary | ICD-10-CM | POA: Diagnosis not present

## 2016-05-29 LAB — GLUCOSE, CAPILLARY: Glucose-Capillary: 130 mg/dL — ABNORMAL HIGH (ref 65–99)

## 2016-05-29 MED ORDER — POLYETHYLENE GLYCOL 3350 17 G PO PACK
17.0000 g | PACK | Freq: Every day | ORAL | Status: DC
  Administered 2016-05-29 – 2016-05-30 (×2): 17 g via ORAL
  Filled 2016-05-29 (×2): qty 1

## 2016-05-29 MED ORDER — CARISOPRODOL 350 MG PO TABS
350.0000 mg | ORAL_TABLET | Freq: Three times a day (TID) | ORAL | Status: DC
  Administered 2016-05-29 – 2016-05-30 (×3): 350 mg via ORAL
  Filled 2016-05-29 (×3): qty 1

## 2016-05-29 NOTE — Progress Notes (Signed)
Patient ambulated 65 feet using front wheel walker. Patient states she has "7/10" pain but tolerated well. Patient returned to bed, states she is going to sleep. Agrees to ambulate with RN later in shift.

## 2016-05-29 NOTE — Progress Notes (Addendum)
Complains of pain, requests another day in the hospital Moving legs well Bandage c/d/i Change flexeril to soma Hopefully home tomorrow

## 2016-05-29 NOTE — Progress Notes (Signed)
Patient ambulated 70 feet with RN using front wheel walker. Tolerated well. Patient now sitting in chair.

## 2016-05-30 DIAGNOSIS — M48061 Spinal stenosis, lumbar region without neurogenic claudication: Secondary | ICD-10-CM | POA: Diagnosis not present

## 2016-05-30 MED ORDER — HYDROCODONE-ACETAMINOPHEN 5-325 MG PO TABS: 1.0000 | ORAL_TABLET | Freq: Four times a day (QID) | ORAL | 0 refills | Status: DC | PRN

## 2016-05-30 MED ORDER — CARISOPRODOL 350 MG PO TABS: 350.0000 mg | ORAL_TABLET | Freq: Three times a day (TID) | ORAL | 0 refills | Status: DC | PRN

## 2016-05-30 MED ORDER — METHYLPREDNISOLONE 4 MG PO TBPK: ORAL_TABLET | ORAL | 0 refills | Status: DC

## 2016-05-30 NOTE — Care Management Note (Signed)
Case Management Note  Patient Details  Name: Roberta Clark MRN: [id number] Date of Birth: 1961/07/08  Subjective/Objective:                    Action/Plan: Pt is discharging home today with family. No further needs per CM. Expected Discharge Date:                  Expected Discharge Plan:  Home/Self Care  In-House Referral:     Discharge planning Services     Post Acute Care Choice:    Choice offered to:     DME Arranged:    DME Agency:     HH Arranged:    HH Agency:     Status of Service:  In process, will continue to follow  If discussed at Long Length of Stay Meetings, dates discussed:    Additional Comments:  Pollie Friar, RN 05/30/2016, 1:00 PM

## 2016-05-30 NOTE — Progress Notes (Signed)
Patient A/O, no noted distress. Continues to experience pain, administered pain regimen. Educated patient on discharge instructions and follow up appointment. Patient was transported downstairs. Patient signed documents and have all belongings.

## 2016-05-30 NOTE — Progress Notes (Signed)
Patient ambulated with writer about 40 feet requested that after her PM medication not to be awaken for vital signs till morning because she fails to go back to sleep.

## 2016-05-30 NOTE — Discharge Summary (Signed)
Physician Discharge Summary  Patient ID: Roberta Clark MRN: [id number] DOB/AGE: 09/18/1960 55 y.o.  Admit date: 05/27/2016 Discharge date: 05/30/2016  Admission Diagnoses: lumbar stenosis    Discharge Diagnoses: same   Discharged Condition: stable  Hospital Course: The patient was admitted on 05/27/2016 and taken to the operating room where the patient underwent LL L4-5. The patient tolerated the procedure well and was taken to the recovery room and then to the floor in stable condition. The hospital course was routine. There were no complications. The wound remained clean dry and intact. Pt had appropriate back soreness. No complaints of leg pain or new N/T/W. The patient remained afebrile with stable vital signs, and tolerated a regular diet. The patient continued to increase activities, and pain was well controlled with oral pain medications.   Consults: None  Significant Diagnostic Studies:  Results for orders placed or performed during the hospital encounter of 05/27/16  Glucose, capillary  Result Value Ref Range   Glucose-Capillary 120 (H) 65 - 99 mg/dL  Glucose, capillary  Result Value Ref Range   Glucose-Capillary 95 65 - 99 mg/dL  Glucose, capillary  Result Value Ref Range   Glucose-Capillary 126 (H) 65 - 99 mg/dL   Comment 1 Notify RN    Comment 2 Document in Chart   Glucose, capillary  Result Value Ref Range   Glucose-Capillary 131 (H) 65 - 99 mg/dL   Comment 1 Notify RN    Comment 2 Document in Chart   Glucose, capillary  Result Value Ref Range   Glucose-Capillary 155 (H) 65 - 99 mg/dL  Glucose, capillary  Result Value Ref Range   Glucose-Capillary 97 65 - 99 mg/dL   Comment 1 Notify RN    Comment 2 Document in Chart   Glucose, capillary  Result Value Ref Range   Glucose-Capillary 130 (H) 65 - 99 mg/dL    Chest 2 View  Result Date: 05/24/2016 CLINICAL DATA:  Preoperative for lumbar spine surgery. EXAM: CHEST  2 VIEW COMPARISON:  03/14/2008 chest  radiograph. FINDINGS: Stable cardiomediastinal silhouette with normal heart size. No pneumothorax. No pleural effusion. No pulmonary edema. No acute consolidative airspace disease. Stable healed deformity in the posterior left ninth rib. IMPRESSION: No active cardiopulmonary disease. Electronically Signed   By: [name] M.D.   On: 05/24/2016 17:51   Dg Lumbar Spine 2-3 Views  Result Date: 05/27/2016 CLINICAL DATA:  Localization images lumbar laminectomy decompression micro discectomy L4-L5 EXAM: LUMBAR SPINE - 2-3 VIEW COMPARISON:  05/10/2016, MRI 04/30/2016 FINDINGS: Lumbar numbering will be in a similar fashion as the MRI dated 04/30/2016. Two lateral images of the lumbar spine are submitted. Lumbar alignment within normal limits. Initial image demonstrates a linear radiopacity posterior to L4. Surgical instruments are seen posterior to the L4-L5 disc level on the second image submitted. IMPRESSION: Limited lateral images for lumbar laminectomy localization. Electronically Signed   By: Donavan Foil M.D.   On: 05/27/2016 16:34    Antibiotics:  Anti-infectives    Start     Dose/Rate Route Frequency Ordered Stop   05/28/16 1000  ceFAZolin (ANCEF) IVPB 1 g/50 mL premix     1 g 100 mL/hr over 30 Minutes Intravenous  Once 05/28/16 0950 05/28/16 1032   05/28/16 0000  ceFAZolin (ANCEF) IVPB 1 g/50 mL premix     1 g 100 mL/hr over 30 Minutes Intravenous Every 8 hours 05/27/16 1733 05/28/16 1033   05/27/16 1528  bacitracin 50,000 Units in sodium chloride irrigation 0.9 % 500  mL irrigation  Status:  Discontinued       As needed 05/27/16 1528 05/27/16 1558   05/27/16 1300  ceFAZolin (ANCEF) IVPB 2g/100 mL premix     2 g 200 mL/hr over 30 Minutes Intravenous To [facility] 05/26/16 0757 05/27/16 1425      Discharge Exam: Blood pressure 100/73, pulse 83, temperature 98.2 F (36.8 C), temperature source Oral, resp. rate 18, height 5\' 4"  (1.626 m), weight 52.6 kg (116 lb), SpO2 100  %. Neurologic: Grossly normal Dressing dry  Discharge Medications:     Medication List    STOP taking these medications   cyclobenzaprine 10 MG tablet Commonly known as:  FLEXERIL     TAKE these medications   ALEVE PM 220-25 MG Tabs Generic drug:  Naproxen Sod-Diphenhydramine Take 2 tablets by mouth at bedtime.   amitriptyline 25 MG tablet Commonly known as:  ELAVIL Take 1 tablet (25 mg total) by mouth at bedtime as needed for sleep (sleep).   carisoprodol 350 MG tablet Commonly known as:  SOMA Take 1 tablet (350 mg total) by mouth 3 (three) times daily as needed for muscle spasms.   CENTRUM SILVER 50+WOMEN Tabs Take 1 tablet by mouth daily.   Fish Oil 1000 MG Caps Take 1,000 mg by mouth 3 (three) times daily.   FLUoxetine 20 MG capsule Commonly known as:  PROZAC Take 1 capsule (20 mg total) by mouth daily.   gabapentin 300 MG capsule Commonly known as:  NEURONTIN Take 1 capsule (300 mg total) by mouth 3 (three) times daily. Take two capsules in the AM, two at lunch, and 4 at bedtime. What changed:  how much to take  additional instructions   gemfibrozil 600 MG tablet Commonly known as:  LOPID 1 tab by mouth twice daily with meals   HYDROcodone-acetaminophen 5-325 MG tablet Commonly known as:  NORCO/VICODIN Take 1-2 tablets by mouth every 6 (six) hours as needed (mild pain).   ibuprofen 800 MG tablet Commonly known as:  ADVIL,MOTRIN Take 800 mg by mouth 2 (two) times daily as needed for moderate pain.   lisinopril-hydrochlorothiazide 20-12.5 MG tablet Commonly known as:  PRINZIDE,ZESTORETIC Take 1 tablet by mouth daily.   metroNIDAZOLE 500 MG tablet Commonly known as:  FLAGYL 1 tab by mouth twice daily for 7 days.   potassium chloride 10 MEQ tablet Commonly known as:  K-DUR,KLOR-CON 1 tab by mouth daily       Disposition: stenosis l4-5   Final Dx: LL for stenosis L4-5  Discharge Instructions    Call MD for:  difficulty breathing, headache  or visual disturbances    Complete by:  As directed    Call MD for:  persistant nausea and vomiting    Complete by:  As directed    Call MD for:  redness, tenderness, or signs of infection (pain, swelling, redness, odor or green/yellow discharge around incision site)    Complete by:  As directed    Call MD for:  severe uncontrolled pain    Complete by:  As directed    Call MD for:  temperature >100.4    Complete by:  As directed    Diet - low sodium heart healthy    Complete by:  As directed    Discharge instructions    Complete by:  As directed    No driving, no bending, no heavy lifting, may shower   Increase activity slowly    Complete by:  As directed    Remove dressing  in 48 hours    Complete by:  As directed          Signed: Maiah Sinning S 05/30/2016, 12:52 PM

## 2016-05-30 NOTE — Care Management Note (Signed)
Case Management Note  Patient Details  Name: Roberta Clark MRN: [id number] Date of Birth: 30-May-1961  Subjective/Objective:  Pt underwent:   Left Lumbar Four-Five Laminectomy and Foraminotomy. She is from home with her spouse.                 Action/Plan: Plan is to discharge home when medically ready. CM following for d/c needs.   Expected Discharge Date:                  Expected Discharge Plan:  Home/Self Care  In-House Referral:     Discharge planning Services     Post Acute Care Choice:    Choice offered to:     DME Arranged:    DME Agency:     HH Arranged:    HH Agency:     Status of Service:  In process, will continue to follow  If discussed at Long Length of Stay Meetings, dates discussed:    Additional Comments:  Pollie Friar, RN 05/30/2016, 11:27 AM

## 2016-06-03 ENCOUNTER — Encounter: Payer: Self-pay | Admitting: Internal Medicine

## 2016-06-17 ENCOUNTER — Encounter: Payer: Self-pay | Admitting: Internal Medicine

## 2016-06-27 ENCOUNTER — Encounter: Payer: Self-pay | Admitting: Internal Medicine

## 2016-07-12 ENCOUNTER — Ambulatory Visit (INDEPENDENT_AMBULATORY_CARE_PROVIDER_SITE_OTHER): Payer: Medicaid Other | Admitting: Internal Medicine

## 2016-07-12 ENCOUNTER — Encounter: Payer: Self-pay | Admitting: Internal Medicine

## 2016-07-12 VITALS — BP 124/82 | HR 76 | Resp 12 | Ht 62.5 in | Wt 120.0 lb

## 2016-07-12 DIAGNOSIS — R1032 Left lower quadrant pain: Secondary | ICD-10-CM | POA: Diagnosis not present

## 2016-07-12 DIAGNOSIS — I10 Essential (primary) hypertension: Secondary | ICD-10-CM

## 2016-07-12 DIAGNOSIS — E785 Hyperlipidemia, unspecified: Secondary | ICD-10-CM

## 2016-07-12 DIAGNOSIS — E119 Type 2 diabetes mellitus without complications: Secondary | ICD-10-CM

## 2016-07-12 DIAGNOSIS — Z23 Encounter for immunization: Secondary | ICD-10-CM | POA: Diagnosis not present

## 2016-07-12 DIAGNOSIS — K635 Polyp of colon: Secondary | ICD-10-CM | POA: Diagnosis not present

## 2016-07-12 DIAGNOSIS — M79652 Pain in left thigh: Secondary | ICD-10-CM | POA: Diagnosis not present

## 2016-07-12 LAB — GLUCOSE, POCT (MANUAL RESULT ENTRY): POC Glucose: 158 mg/dl — AB (ref 70–99)

## 2016-07-12 MED ORDER — FISH OIL 1000 MG PO CAPS: ORAL_CAPSULE | ORAL | Status: DC

## 2016-07-12 MED ORDER — IBUPROFEN 800 MG PO TABS: ORAL_TABLET | ORAL | 0 refills | Status: DC

## 2016-07-12 NOTE — Progress Notes (Signed)
Subjective:    Patient ID: Roberta Clark, female    DOB: 07-03-1961, 55 y.o.   MRN: [id number]  HPI   1.  Underwent laminectomy of L4-5 on 05/27/2016 with Dr. Ronnald Ramp, NS.  Patient states low back pain better, but still with pain down lateral and anterior left thigh.  Also with pain into groin. Has never had films of left hip and L/S films do not show left hip joint as well.   No numbness, tingling or weakness of leg.  Did have numbness in left foot numbness, but that has resolved. Has not had any physical therapy.  Walking with 4 pronged cane.  Is in with pain management clinic now as well.  She believes with a Dr. Albertina Parr. Discussed she needs to get all controlled pain meds/muscle relaxants through them.  2.  Essential Hypertension:  BP fine now pain decreased.  3. DM:  A1C with visit in September at goal 6.7%, but creeping up.  Describes a healthy diet.  Little physical activity for prolonged period of time with her back issues and now groin/thigh/pelvis.   She has not tried walking in 3 feet of pool yet.  [facility].  4.  Hyperlipidemia:  Rash with statin in past.  On Gemfibrozil and total of 3,000 mg of Fish Oil.  Last panel improved, but not at goal.   Lipid Panel     Component Value Date/Time   CHOL 214 (H) 05/06/2016 1220   TRIG 174 (H) 05/06/2016 1220   HDL 38 (L) 05/06/2016 1220   CHOLHDL 4.4 07/25/2012 1413   VLDL 19 07/25/2012 1413   LDLCALC 141 (H) 05/06/2016 1220   LDLDIRECT 111 (H) 06/10/2009 1933   Current Meds  Medication Sig  . amitriptyline (ELAVIL) 25 MG tablet Take 1 tablet (25 mg total) by mouth at bedtime as needed for sleep (sleep).  . carisoprodol (SOMA) 350 MG tablet Take 1 tablet (350 mg total) by mouth 3 (three) times daily as needed for muscle spasms.  Marland Kitchen FLUoxetine (PROZAC) 20 MG capsule Take 1 capsule (20 mg total) by mouth daily.  Marland Kitchen gabapentin (NEURONTIN) 300 MG capsule Take 1 capsule (300 mg total) by mouth 3 (three) times daily.  Take two capsules in the AM, two at lunch, and 4 at bedtime. (Patient taking differently: Take 600-1,200 mg by mouth 3 (three) times daily. Take two capsules in the AM, two at lunch, and 4 at bedtime.)  . gemfibrozil (LOPID) 600 MG tablet 1 tab by mouth twice daily with meals  . HYDROcodone-acetaminophen (NORCO/VICODIN) 5-325 MG tablet Take 1-2 tablets by mouth every 6 (six) hours as needed (mild pain).  Marland Kitchen lisinopril-hydrochlorothiazide (PRINZIDE,ZESTORETIC) 20-12.5 MG tablet Take 1 tablet by mouth daily.  . Multiple Vitamins-Minerals (CENTRUM SILVER 50+WOMEN) TABS Take 1 tablet by mouth daily.  . Naproxen Sod-Diphenhydramine (ALEVE PM) 220-25 MG TABS Take 2 tablets by mouth at bedtime.   . Omega-3 Fatty Acids (FISH OIL) 1000 MG CAPS 2 caps by mouth twice daily  . potassium chloride (K-DUR,KLOR-CON) 10 MEQ tablet 1 tab by mouth daily  . [DISCONTINUED] ibuprofen (ADVIL,MOTRIN) 800 MG tablet Take 800 mg by mouth 2 (two) times daily as needed for moderate pain. Take 800 mg once daily in the morning as needed for pain.   . [DISCONTINUED] Omega-3 Fatty Acids (FISH OIL) 1000 MG CAPS Take 1,000 mg by mouth 3 (three) times daily.    Allergies  Allergen Reactions  . Lipitor [Atorvastatin] Rash  Review of Systems     Objective:   Physical Exam  Lumbar incisional scar well healed, but mild surrounding swelling without erythema. Tender most at left mid buttock, somewhat over left greater trochanter. Decreased flexion and extension  at hip due to pain more so with low back, but also more pain in groin with internal rotation of hip.  Less so on external rotation.      Assessment & Plan:  1.  Left groin, buttock and anterolateral thigh pain:  Concerned for left hip DJD:  Send for Xray of hip.   When have results, to consult with NS/pain clinic on whether PT is an option without aggravating back issues. Patient to check with Dr. Ronnald Ramp regarding getting into pool for light walking in 3 feet.  2.   Essential Hypertension:  Fine with decreased pain.  3.  Hyperlipidemia:  Increase Fish oil to 4,000 mg daily, recheck in 3 months.  Hopefully, will be able to get more physically active to bring this to goal as well.  4.  DM:  At goal with A1C, but again, hope to get patient more physically active. Influenza vaccine today.  5.  History of colon polyp with dysplasia in 2014 at [facility].  Unable to get transportation there now.  Refer to Dr. Ardis Hughs for follow up Colonoscopy--plan for 6 months from surgery ( March)

## 2016-07-12 NOTE — Patient Instructions (Signed)
Check with Dr. Ronnald Ramp regarding walking in 3 feet at pool--start slow and gradually increase time in pool Down the road, recommend learning how to swim

## 2016-07-13 ENCOUNTER — Ambulatory Visit
Admission: RE | Admit: 2016-07-13 | Discharge: 2016-07-13 | Disposition: A | Discharge status: Home / Self Care | Payer: Medicaid Other | Source: Ambulatory Visit | Attending: Internal Medicine | Admitting: Internal Medicine

## 2016-07-13 DIAGNOSIS — M79652 Pain in left thigh: Secondary | ICD-10-CM

## 2016-07-13 DIAGNOSIS — R1032 Left lower quadrant pain: Secondary | ICD-10-CM

## 2016-07-14 ENCOUNTER — Other Ambulatory Visit: Payer: Self-pay | Admitting: Internal Medicine

## 2016-07-14 DIAGNOSIS — R1031 Right lower quadrant pain: Secondary | ICD-10-CM

## 2016-07-14 DIAGNOSIS — M87052 Idiopathic aseptic necrosis of left femur: Secondary | ICD-10-CM

## 2016-07-14 NOTE — Progress Notes (Signed)
Patient shared right groin pain today with phone call--Xray of left shows AVN left femoral head

## 2016-07-14 NOTE — Progress Notes (Signed)
Needs referral to ortho for avn of left femoral head

## 2016-07-19 ENCOUNTER — Ambulatory Visit
Admission: RE | Admit: 2016-07-19 | Discharge: 2016-07-19 | Disposition: A | Discharge status: Home / Self Care | Payer: Medicaid Other | Source: Ambulatory Visit | Attending: Internal Medicine | Admitting: Internal Medicine

## 2016-07-19 DIAGNOSIS — R1031 Right lower quadrant pain: Secondary | ICD-10-CM

## 2016-07-20 NOTE — Progress Notes (Signed)
Spoke with Dr. Peri Maris office. I re faxed patients information to his office. Informed Dr. Maureen Ralphs would have to review info first, then someone from his office will call patient to schedule appointment. Spoke with patient informed her of conversation with Dr. Peri Maris office.

## 2016-07-20 NOTE — Progress Notes (Signed)
Left detailed message on voicemail in regards to right hip X-Ray.

## 2016-08-18 ENCOUNTER — Ambulatory Visit: Payer: Medicaid Other

## 2016-08-22 ENCOUNTER — Telehealth: Payer: Self-pay | Admitting: Internal Medicine

## 2016-08-22 NOTE — Telephone Encounter (Signed)
Patient inquiry regarding urine has an order and patient has yellowish discharge.  Should she come in for urine test?  Please advise.  Please call patient at [phone]

## 2016-08-23 NOTE — Telephone Encounter (Signed)
Spoke with patient has had symptoms for 2-3 months. Patient is scheduled to come in for appointment on 08/30/16 @ 3 pm.

## 2016-08-24 ENCOUNTER — Ambulatory Visit
Admission: RE | Admit: 2016-08-24 | Discharge: 2016-08-24 | Disposition: A | Discharge status: Home / Self Care | Payer: Medicaid Other | Source: Ambulatory Visit | Attending: Internal Medicine | Admitting: Internal Medicine

## 2016-08-24 DIAGNOSIS — Z1239 Encounter for other screening for malignant neoplasm of breast: Secondary | ICD-10-CM

## 2016-08-30 ENCOUNTER — Ambulatory Visit: Payer: Medicaid Other | Admitting: Internal Medicine

## 2016-09-20 ENCOUNTER — Other Ambulatory Visit: Payer: Self-pay | Admitting: Internal Medicine

## 2016-09-20 DIAGNOSIS — F329 Major depressive disorder, single episode, unspecified: Secondary | ICD-10-CM

## 2016-09-20 DIAGNOSIS — G4701 Insomnia due to medical condition: Secondary | ICD-10-CM

## 2016-09-20 DIAGNOSIS — F32A Depression, unspecified: Secondary | ICD-10-CM

## 2016-09-20 DIAGNOSIS — G8929 Other chronic pain: Principal | ICD-10-CM

## 2016-10-11 ENCOUNTER — Other Ambulatory Visit: Payer: Medicaid Other | Admitting: Internal Medicine

## 2016-10-11 ENCOUNTER — Other Ambulatory Visit: Payer: Medicaid Other

## 2016-10-11 ENCOUNTER — Other Ambulatory Visit: Payer: Self-pay | Admitting: Internal Medicine

## 2016-10-11 DIAGNOSIS — F329 Major depressive disorder, single episode, unspecified: Secondary | ICD-10-CM

## 2016-10-11 DIAGNOSIS — E785 Hyperlipidemia, unspecified: Secondary | ICD-10-CM

## 2016-10-11 DIAGNOSIS — F32A Depression, unspecified: Secondary | ICD-10-CM

## 2016-10-11 DIAGNOSIS — E119 Type 2 diabetes mellitus without complications: Secondary | ICD-10-CM

## 2016-10-12 LAB — LIPID PANEL W/O CHOL/HDL RATIO
CHOLESTEROL TOTAL: 235 mg/dL — AB (ref 100–199)
HDL: 36 mg/dL — ABNORMAL LOW (ref >39)
LDL Calculated: 151 mg/dL — ABNORMAL HIGH (ref 0–99)
Triglycerides: 242 mg/dL — ABNORMAL HIGH (ref 0–149)
VLDL Cholesterol Cal: 48 mg/dL — ABNORMAL HIGH (ref 5–40)

## 2016-10-12 LAB — HGB A1C W/O EAG: HEMOGLOBIN A1C: 6.2 % — AB (ref 4.8–5.6)

## 2016-10-12 NOTE — Progress Notes (Signed)
Please place orders in EPIC as patient is being scheduled for a Pre-op appointment! Thank you! 

## 2016-10-17 NOTE — Progress Notes (Signed)
PReop on 10/19/16.  surgeryon 10/24/16.  Needs orders in epic.

## 2016-10-18 ENCOUNTER — Ambulatory Visit (INDEPENDENT_AMBULATORY_CARE_PROVIDER_SITE_OTHER): Payer: Medicaid Other | Admitting: Internal Medicine

## 2016-10-18 ENCOUNTER — Encounter: Payer: Self-pay | Admitting: Internal Medicine

## 2016-10-18 ENCOUNTER — Ambulatory Visit: Payer: Self-pay | Admitting: Orthopedic Surgery

## 2016-10-18 VITALS — BP 124/80 | HR 84 | Resp 12 | Ht 63.0 in | Wt 115.0 lb

## 2016-10-18 DIAGNOSIS — Z79899 Other long term (current) drug therapy: Secondary | ICD-10-CM

## 2016-10-18 DIAGNOSIS — E782 Mixed hyperlipidemia: Secondary | ICD-10-CM

## 2016-10-18 DIAGNOSIS — I10 Essential (primary) hypertension: Secondary | ICD-10-CM

## 2016-10-18 DIAGNOSIS — F17200 Nicotine dependence, unspecified, uncomplicated: Secondary | ICD-10-CM

## 2016-10-18 DIAGNOSIS — R9431 Abnormal electrocardiogram [ECG] [EKG]: Secondary | ICD-10-CM

## 2016-10-18 DIAGNOSIS — E785 Hyperlipidemia, unspecified: Secondary | ICD-10-CM | POA: Diagnosis not present

## 2016-10-18 DIAGNOSIS — E119 Type 2 diabetes mellitus without complications: Secondary | ICD-10-CM | POA: Diagnosis not present

## 2016-10-18 NOTE — Progress Notes (Signed)
Subjective:    Patient ID: Roberta Clark, female    DOB: 1961/07/31, 56 y.o.   MRN: [id number]  HPI   Here for medical clearance for Total Hip Replacement with Dr. Maureen Ralphs in about 1 week.    Patient with current health issues of:  1.  DM Type 2:  Last A1C of 6.2% 10/11/2016.  Her blood glucose control has never been above  6.7% in the past year while she has been followed at [facility].  Her diabetes has been diet controlled.  She does not tolerate Metformin with GI intolerance.  Had eye exam with Dr. Katy Fitch in past year with no findings of diabetic change.   2.  Essential Hypertension:  Well controlled with Lisinopril/HCTZ 20/12.5 mg.  BUN/Crea will need to be checked preop, but has been normal in past year.  Tends toward borderline low potassium.  3.  Hyperlipidemia:  Had a rash with statin in past.  Not at goal with levels, last checked 10/11/2016.  Recently increased fish oil to 2000 mg twice daily along with Gemfibrozil.  Her limitations with physical activity have also impaired her ability to get this to goal.  Total less than 200, LDL less than 70, Trigs less than 150 and HDL above 45. Lipid Panel     Component Value Date/Time   CHOL 235 (H) 10/11/2016 0909   TRIG 242 (H) 10/11/2016 0909   HDL 36 (L) 10/11/2016 0909   CHOLHDL 4.4 07/25/2012 1413   VLDL 19 07/25/2012 1413   LDLCALC 151 (H) 10/11/2016 0909   LDLDIRECT 111 (H) 06/10/2009 1933   Current Meds  Medication Sig  . amitriptyline (ELAVIL) 25 MG tablet TAKE ONE TABLET BY MOUTH AT BEDTIME AS NEEDED FOR SLEEP (Patient taking differently: TAKE ONE TABLET BY MOUTH AT BEDTIME)  . cyclobenzaprine (FLEXERIL) 10 MG tablet Take 10 mg by mouth 2 (two) times daily.  Marland Kitchen FLUoxetine (PROZAC) 20 MG capsule TAKE ONE CAPSULE BY MOUTH ONCE DAILY  . gabapentin (NEURONTIN) 300 MG capsule Take 1 capsule (300 mg total) by mouth 3 (three) times daily. Take two capsules in the AM, two at lunch, and 4 at bedtime. (Patient taking differently: Take  600-1,200 mg by mouth 3 (three) times daily. Take two capsules in the AM, two at lunch, and 4 at bedtime.)  . gemfibrozil (LOPID) 600 MG tablet 1 tab by mouth twice daily with meals  . ibuprofen (ADVIL,MOTRIN) 800 MG tablet 1 tab by mouth in the morning for moderate pain as needed.  Take with food. (Patient taking differently: Take 800-1,600 mg by mouth daily as needed (for pain. Take with food.). )  . lisinopril-hydrochlorothiazide (PRINZIDE,ZESTORETIC) 20-12.5 MG tablet Take 1 tablet by mouth daily.  . Multiple Vitamin (MULTIVITAMIN WITH MINERALS) TABS tablet Take 1 tablet by mouth daily.  . Naproxen Sod-Diphenhydramine (ALEVE PM) 220-25 MG TABS Take 2 tablets by mouth at bedtime.   . Omega-3 Fatty Acids (FISH OIL) 1000 MG CAPS 2 caps by mouth twice daily (Patient taking differently: Take 2 capsules by mouth daily. )  . oxyCODONE-acetaminophen (PERCOCET/ROXICET) 5-325 MG tablet Take 1 tablet by mouth every 4 (four) hours as needed (for pain.).   Marland Kitchen potassium chloride (K-DUR,KLOR-CON) 10 MEQ tablet 1 tab by mouth daily (Patient taking differently: Take 10 mEq by mouth daily. )  . tiZANidine (ZANAFLEX) 4 MG capsule Take 4 mg by mouth 3 (three) times daily.   Allergies  Allergen Reactions  . Lipitor [Atorvastatin] Rash    Past Medical History:  Diagnosis Date  . Chronic pain   . Depression   . Diabetes mellitus without complication ([place]) [id number]   Type II - diet controlled  . Family history of adverse reaction to anesthesia    mother - nausea  . History of anemia   . Hyperlipidemia 2011   Rash with statin:  not well controlled with Gemfibrozil  . Hypertension 2011  . Insomnia secondary to chronic pain   . Low back pain with left-sided sciatica    Reportedly with lumbar spinal stenosis    Past Surgical History:  Procedure Laterality Date  . COLONOSCOPY    . LUMBAR LAMINECTOMY/DECOMPRESSION MICRODISCECTOMY Bilateral 05/27/2016   Procedure: Left Lumbar Four-Five Laminectomy and  Foraminotomy;  Surgeon: Eustace Moore, MD;  Location: [place];  Service: Neurosurgery;  Laterality: Bilateral;  . TUBAL LIGATION  1985   Family History  Problem Relation Age of Onset  . Heart disease Mother 93    Hx of MI   . Hypertension Mother   . Diabetes Mother   . Diabetes Sister   . Graves' disease Sister   . Lupus Father   . Graves' disease Brother   . Diabetes Brother   . Arthritis Daughter     Not clear if RA  . Hypertension Daughter   . Migraines Daughter   . Epilepsy Son     Started 89 months of age after bacterial meningitis  . GER disease Sister    Social History   Social History  . Marital status: Single    Spouse name: N/A  . Number of children: 2  . Years of education: College   Occupational History  . 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 [employer]    Psychologist, forensic   Social History Main Topics  . Smoking status: Current Some Day Smoker    Packs/day: 0.05    Types: Cigarettes    Start date: 12/17/1976  . Smokeless tobacco: Never Used     Comment: 3 cigs per day 05/24/16  . Alcohol use No  . Drug use: No  . Sexual activity: Yes    Birth control/ protection: Post-menopausal   Other Topics Concern  . Not on file   Social History Narrative   Lives alone   Has a significant other--did not want to elaborate.   Children live in town and are supportive.   Originally from [place].   Went to [employer] [place].       Review of Systems  Respiratory:       No shortness of breath, cough, choking.    Cardiovascular:       No chest pain with exertion (minimal cleaning of home--limited due to back and hip related pain) or with rest.   No history of angina or MI. No swelling of legs.   No history of palpitations No history of syncope Last EKG in 05/2016 showed nonspecific T wave abnormality done preop for lumbar laminectomy   Neurological:       No long term peripheral neuropathic symptoms.  Occasional positional numbness or tingling in hands. No focal  numbness, tingling, weakness of face of extremities otherwise.  No history of sudden speech abnormality.  No history of stroke.    Psychiatric/Behavioral:       Depression has been well controlled despite chronic pain issues.       Objective:   Physical Exam  Obvious pain ambulating to exam table--limp favoring left lower limb HEENT:  PERRL, EOMI, throat without injection Neck:  Supple, No adenopathy, no thyromegaly  Chest:  CTA CV: RRR with normal S1 and S2, No S3,  S4 or murmur.  No carotid bruits.  Carotid, radial, femoral, DP pulses normal and equal.  No JVD. Abd:  S, NT, No HSM or mass, + BS LE:  No edema Neuro:  A & O x 3, CN II-XII grossly intact.  Motor 5/5 throughout, DTRs 2+/4 throughout, though slightly less prominent left patella and ankle though patient with difficulty relaxing leg due to pain.        Assessment & Plan:  Patient Medically Cleared for Total Hip Replacement in 6 days with Dr. Maureen Ralphs. EKG appears unchanged with nonspecific Twave change except has prolonged QT by EKG computer read out.  I measure a normal QT interval where Twaves most obvious in leads V2, V3 and V5  (.320-.360).   Despite this, will check CMP, TSH, Magnesium to rule out a possible abnormality. Medications she is taking that can add to this:  Amitriptyline, Fluoxetine, opioid pain relievers, though these are all long term meds she was taking at time of EKG in October.   Again, believe her QT interval is actually normal and computer readout not picking up on correct measurement due to nonspecific T wave changes in most leads.   Recommend close attention to blood sugars and blood pressure control during hospitalization.   To continue with her Lisinopril/HCTZ during the hospitalization with close attention to electrolytes. May need sliding scale insulin to keep blood glucose under control if has prolonged NPO time period postoperatively.  Her baseline average blood sugars are about 140. Finally,  encourage to quit smoking prior to and permanently to decrease pulmonary complications.

## 2016-10-18 NOTE — Patient Instructions (Signed)
Roberta Clark  10/18/2016   Your procedure is scheduled on: 10/24/16   Report to [facility] Main  Entrance take [facility]  elevators to 3rd floor to  [place] at   1:30 PM  Call this number if you have problems the morning of surgery [phone]   Remember: ONLY 1 PERSON MAY GO WITH YOU TO SHORT STAY TO GET  READY MORNING OF YOUR SURGERY.  Do not eat food :After Midnight. YOU MAY HAVE A CLEAR LIQUID DIET FROM MIDNIGHT TILL 0800 AM THEN NOTHING BY MOUTH              CLEAR LIQUID DIET   Foods Allowed                                                                     Foods Excluded  Coffee and tea, regular and decaf                             liquids that you cannot  Plain Jell-O in any flavor                                             see through such as: Fruit ices (not with fruit pulp)                                     milk, soups, orange juice  Iced Popsicles                                    All solid food Carbonated beverages, regular and diet                                    Cranberry, grape and apple juices Sports drinks like Gatorade Lightly seasoned clear broth or consume(fat free) Sugar, honey syrup  Sample Menu Breakfast                                Lunch                                     Supper Cranberry juice                    Beef broth                            Chicken broth Jell-O  Grape juice                           Apple juice Coffee or tea                        Jell-O                                      Popsicle                                                Coffee or tea                        Coffee or tea  _____________________________________________________________________                Take these medicines the morning of surgery with A SIP OF WATER:Neurontin,  prozac                                You may not have any metal on your body including hair pins and               piercings  Do not wear jewelry, make-up, lotions, powders or perfumes, deodorant             Do not wear nail polish.  Do not shave  48 hours prior to surgery.                Do not bring valuables to the hospital. [place].  Contacts, dentures or bridgework may not be worn into surgery.  Leave suitcase in the car. After surgery it may be brought to your room.     Patients discharged the day of surgery will not be allowed to drive home.  Name and phone number of your driver:  Special Instructions: N/A              Please read over the following fact sheets you were given: _____________________________________________________________________             [facility] - Preparing for Surgery Before surgery, you can play an important role.  Because skin is not sterile, your skin needs to be as free of germs as possible.  You can reduce the number of germs on your skin by washing with CHG (chlorahexidine gluconate) soap before surgery.  CHG is an antiseptic cleaner which kills germs and bonds with the skin to continue killing germs even after washing. Please DO NOT use if you have an allergy to CHG or antibacterial soaps.  If your skin becomes reddened/irritated stop using the CHG and inform your nurse when you arrive at Short Stay. Do not shave (including legs and underarms) for at least 48 hours prior to the first CHG shower.  You may shave your face/neck. Please follow these instructions carefully:  1.  Shower with CHG Soap the night before surgery and the  morning of Surgery.  2.  If you choose to wash your hair, wash your hair first as  usual with your  normal  shampoo.  3.  After you shampoo, rinse your hair and body thoroughly to remove the  shampoo.                           4.  Use CHG as you would any other liquid soap.  You can apply chg directly  to the skin and wash                       Gently with a scrungie or clean  washcloth.  5.  Apply the CHG Soap to your body ONLY FROM THE NECK DOWN.   Do not use on face/ open                           Wound or open sores. Avoid contact with eyes, ears mouth and genitals (private parts).                       Wash face,  Genitals (private parts) with your normal soap.             6.  Wash thoroughly, paying special attention to the area where your surgery  will be performed.  7.  Thoroughly rinse your body with warm water from the neck down.  8.  DO NOT shower/wash with your normal soap after using and rinsing off  the CHG Soap.                9.  Pat yourself dry with a clean towel.            10.  Wear clean pajamas.            11.  Place clean sheets on your bed the night of your first shower and do not  sleep with pets. Day of Surgery : Do not apply any lotions/deodorants the morning of surgery.  Please wear clean clothes to the hospital/surgery center.  FAILURE TO FOLLOW THESE INSTRUCTIONS MAY RESULT IN THE CANCELLATION OF YOUR SURGERY PATIENT SIGNATURE_________________________________  NURSE SIGNATURE__________________________________  ________________________________________________________________________  WHAT IS A BLOOD TRANSFUSION? Blood Transfusion Information  A transfusion is the replacement of blood or some of its parts. Blood is made up of multiple cells which provide different functions.  Red blood cells carry oxygen and are used for blood loss replacement.  White blood cells fight against infection.  Platelets control bleeding.  Plasma helps clot blood.  Other blood products are available for specialized needs, such as hemophilia or other clotting disorders. BEFORE THE TRANSFUSION  Who gives blood for transfusions?   Healthy volunteers who are fully evaluated to make sure their blood is safe. This is blood bank blood. Transfusion therapy is the safest it has ever been in the practice of medicine. Before blood is taken from a donor, a  complete history is taken to make sure that person has no history of diseases nor engages in risky social behavior (examples are intravenous drug use or sexual activity with multiple partners). The donor's travel history is screened to minimize risk of transmitting infections, such as malaria. The donated blood is tested for signs of infectious diseases, such as HIV and hepatitis. The blood is then tested to be sure it is compatible with you in order to minimize the chance of a transfusion reaction. If you or a relative donates blood, this is often done  in anticipation of surgery and is not appropriate for emergency situations. It takes many days to process the donated blood. RISKS AND COMPLICATIONS Although transfusion therapy is very safe and saves many lives, the main dangers of transfusion include:   Getting an infectious disease.  Developing a transfusion reaction. This is an allergic reaction to something in the blood you were given. Every precaution is taken to prevent this. The decision to have a blood transfusion has been considered carefully by your caregiver before blood is given. Blood is not given unless the benefits outweigh the risks. AFTER THE TRANSFUSION  Right after receiving a blood transfusion, you will usually feel much better and more energetic. This is especially true if your red blood cells have gotten low (anemic). The transfusion raises the level of the red blood cells which carry oxygen, and this usually causes an energy increase.  The nurse administering the transfusion will monitor you carefully for complications. HOME CARE INSTRUCTIONS  No special instructions are needed after a transfusion. You may find your energy is better. Speak with your caregiver about any limitations on activity for underlying diseases you may have. SEEK MEDICAL CARE IF:   Your condition is not improving after your transfusion.  You develop redness or irritation at the intravenous (IV)  site. SEEK IMMEDIATE MEDICAL CARE IF:  Any of the following symptoms occur over the next 12 hours:  Shaking chills.  You have a temperature by mouth above 102 F (38.9 C), not controlled by medicine.  Chest, back, or muscle pain.  People around you feel you are not acting correctly or are confused.  Shortness of breath or difficulty breathing.  Dizziness and fainting.  You get a rash or develop hives.  You have a decrease in urine output.  Your urine turns a dark color or changes to pink, red, or brown. Any of the following symptoms occur over the next 10 days:  You have a temperature by mouth above 102 F (38.9 C), not controlled by medicine.  Shortness of breath.  Weakness after normal activity.  The white part of the eye turns yellow (jaundice).  You have a decrease in the amount of urine or are urinating less often.  Your urine turns a dark color or changes to pink, red, or brown. Document Released: 07/29/2000 Document Revised: 10/24/2011 Document Reviewed: 03/17/2008 Exit[employer] Patient Information 2014 [place].  _______________________________________________________________________  Incentive Spirometer  An incentive spirometer is a tool that can help keep your lungs clear and active. This tool measures how well you are filling your lungs with each breath. Taking long deep breaths may help reverse or decrease the chance of developing breathing (pulmonary) problems (especially infection) following:  A long period of time when you are unable to move or be active. BEFORE THE PROCEDURE   If the spirometer includes an indicator to show your best effort, your nurse or respiratory therapist will set it to a desired goal.  If possible, sit up straight or lean slightly forward. Try not to slouch.  Hold the incentive spirometer in an upright position. INSTRUCTIONS FOR USE  1. Sit on the edge of your bed if possible, or sit up as far as you can in bed or on a  chair. 2. Hold the incentive spirometer in an upright position. 3. Breathe out normally. 4. Place the mouthpiece in your mouth and seal your lips tightly around it. 5. Breathe in slowly and as deeply as possible, raising the piston or the ball toward the  top of the column. 6. Hold your breath for 3-5 seconds or for as long as possible. Allow the piston or ball to fall to the bottom of the column. 7. Remove the mouthpiece from your mouth and breathe out normally. 8. Rest for a few seconds and repeat Steps 1 through 7 at least 10 times every 1-2 hours when you are awake. Take your time and take a few normal breaths between deep breaths. 9. The spirometer may include an indicator to show your best effort. Use the indicator as a goal to work toward during each repetition. 10. After each set of 10 deep breaths, practice coughing to be sure your lungs are clear. If you have an incision (the cut made at the time of surgery), support your incision when coughing by placing a pillow or rolled up towels firmly against it. Once you are able to get out of bed, walk around indoors and cough well. You may stop using the incentive spirometer when instructed by your caregiver.  RISKS AND COMPLICATIONS  Take your time so you do not get dizzy or light-headed.  If you are in pain, you may need to take or ask for pain medication before doing incentive spirometry. It is harder to take a deep breath if you are having pain. AFTER USE  Rest and breathe slowly and easily.  It can be helpful to keep track of a log of your progress. Your caregiver can provide you with a simple table to help with this. If you are using the spirometer at home, follow these instructions: [place] IF:   You are having difficultly using the spirometer.  You have trouble using the spirometer as often as instructed.  Your pain medication is not giving enough relief while using the spirometer.  You develop fever of 100.5 F  (38.1 C) or higher. SEEK IMMEDIATE MEDICAL CARE IF:   You cough up bloody sputum that had not been present before.  You develop fever of 102 F (38.9 C) or greater.  You develop worsening pain at or near the incision site. MAKE SURE YOU:   Understand these instructions.  Will watch your condition.  Will get help right away if you are not doing well or get worse. Document Released: 12/12/2006 Document Revised: 10/24/2011 Document Reviewed: 02/12/2007 [facility] Patient Information 2014 [place], [place].   ________________________________________________________________________

## 2016-10-19 ENCOUNTER — Encounter (HOSPITAL_COMMUNITY)
Admission: RE | Admit: 2016-10-19 | Discharge: 2016-10-19 | Disposition: A | Discharge status: Home / Self Care | Payer: Medicaid Other | Source: Ambulatory Visit | Attending: Orthopedic Surgery | Admitting: Orthopedic Surgery

## 2016-10-19 LAB — COMPREHENSIVE METABOLIC PANEL
A/G RATIO: 1.8 (ref 1.2–2.2)
ALT: 21 IU/L (ref 0–32)
AST: 19 IU/L (ref 0–40)
Albumin: 5.1 g/dL (ref 3.5–5.5)
Alkaline Phosphatase: 90 IU/L (ref 39–117)
BUN/Creatinine Ratio: 28 — ABNORMAL HIGH (ref 9–23)
BUN: 20 mg/dL (ref 6–24)
Bilirubin Total: <0.2 mg/dL (ref 0.0–1.2)
CALCIUM: 10.5 mg/dL — AB (ref 8.7–10.2)
CO2: 26 mmol/L (ref 18–29)
CREATININE: 0.72 mg/dL (ref 0.57–1.00)
Chloride: 98 mmol/L (ref 96–106)
GFR, EST AFRICAN AMERICAN: 108 mL/min/{1.73_m2} (ref >59)
GFR, EST NON AFRICAN AMERICAN: 94 mL/min/{1.73_m2} (ref >59)
Globulin, Total: 2.8 g/dL (ref 1.5–4.5)
Glucose: 115 mg/dL — ABNORMAL HIGH (ref 65–99)
POTASSIUM: 4.8 mmol/L (ref 3.5–5.2)
Sodium: 141 mmol/L (ref 134–144)
TOTAL PROTEIN: 7.9 g/dL (ref 6.0–8.5)

## 2016-10-19 LAB — TSH: TSH: 0.708 u[IU]/mL (ref 0.450–4.500)

## 2016-10-19 LAB — MAGNESIUM: Magnesium: 1.9 mg/dL (ref 1.6–2.3)

## 2016-10-20 NOTE — Patient Instructions (Addendum)
Roberta Clark  10/20/2016   Your procedure is scheduled on: Monday 10/24/2016  Report to [facility] Main  Entrance take [facility]  elevators to 3rd floor to  Roberta Clark at 130  PM.  Call this number if you have problems the morning of surgery [phone]   Remember: ONLY 1 PERSON MAY GO WITH YOU TO SHORT STAY TO GET  READY MORNING OF Roberta Clark.       How to Manage Your Diabetes Before and After Surgery  Why is it important to control my blood sugar before and after surgery? . Improving blood sugar levels before and after surgery helps healing and can limit problems. . A way of improving blood sugar control is eating a healthy diet by: o  Eating less sugar and carbohydrates o  Increasing activity/exercise o  Talking with your doctor about reaching your blood sugar goals . High blood sugars (greater than 180 mg/dL) can raise your risk of infections and slow your recovery, so you will need to focus on controlling your diabetes during the weeks before surgery. . Make sure that the doctor who takes care of your diabetes knows about your planned surgery including the date and location.  How do I manage my blood sugar before surgery? . Check your blood sugar at least 4 times a day, starting 2 days before surgery, to make sure that the level is not too high or low. o Check your blood sugar the morning of your surgery when you wake up and every 2 hours until you get to the Short Stay unit. . If your blood sugar is less than 70 mg/dL, you will need to treat for low blood sugar: o Do not take insulin. o Treat a low blood sugar (less than 70 mg/dL) with  cup of clear juice (cranberry or apple), 4 glucose tablets, OR glucose gel. o Recheck blood sugar in 15 minutes after treatment (to make sure it is greater than 70 mg/dL). If your blood sugar is not greater than 70 mg/dL on recheck, call [phone] for further instructions. . Report your blood sugar to the short stay  nurse when you get to Short Stay.  . If you are admitted to the hospital after surgery: o Your blood sugar will be checked by the staff and you will probably be given insulin after surgery (instead of oral diabetes medicines) to make sure you have good blood sugar levels. o The goal for blood sugar control after surgery is 80-180 mg/dL.   WHAT DO I DO ABOUT MY DIABETES MEDICATION?  Take No Diabetic medications morning of surgery!             DO NOT EAT FOOD : after midnight. May have clear liquids from midnight up until 1030 am then nothing until after surgery!!         CLEAR LIQUID DIET   Foods Allowed                                                                     Foods Excluded  Coffee and tea, regular and decaf  liquids that you cannot  Plain Jell-O in any flavor                                             see through such as: Fruit ices (not with fruit pulp)                                     milk, soups, orange juice  Iced Popsicles                                    All solid food Carbonated beverages, regular and diet                                    Cranberry, grape and apple juices Sports drinks like Gatorade Lightly seasoned clear broth or consume(fat free) Sugar, honey syrup  Sample Menu Breakfast                                Lunch                                     Supper Cranberry juice                    Beef broth                            Chicken broth Jell-O                                     Grape juice                           Apple juice Coffee or tea                        Jell-O                                      Popsicle                                                Coffee or tea                        Coffee or tea  _____________________________________________________________________     Take these medicines the morning of surgery with A SIP OF WATER: Gabapentin (Neurontin),Prozac                                 You may not have any metal on your body including hair  pins and              piercings  Do not wear jewelry, make-up, lotions, powders or perfumes, deodorant             Do not wear nail polish.  Do not shave  48 hours prior to surgery.              Men may shave face and neck.   Do not bring valuables to the hospital. [place].  Contacts, dentures or bridgework may not be worn into surgery.  Leave suitcase in the car. After surgery it may be brought to your room.                  Please read over the following fact sheets you were given: _____________________________________________________________________             [facility] - Preparing for Surgery Before surgery, you can play an important role.  Because skin is not sterile, your skin needs to be as free of germs as possible.  You can reduce the number of germs on your skin by washing with CHG (chlorahexidine gluconate) soap before surgery.  CHG is an antiseptic cleaner which kills germs and bonds with the skin to continue killing germs even after washing. Please DO NOT use if you have an allergy to CHG or antibacterial soaps.  If your skin becomes reddened/irritated stop using the CHG and inform your nurse when you arrive at Short Stay. Do not shave (including legs and underarms) for at least 48 hours prior to the first CHG shower.  You may shave your face/neck. Please follow these instructions carefully:  1.  Shower with CHG Soap the night before surgery and the  morning of Surgery.  2.  If you choose to wash your hair, wash your hair first as usual with your  normal  shampoo.  3.  After you shampoo, rinse your hair and body thoroughly to remove the  shampoo.                           4.  Use CHG as you would any other liquid soap.  You can apply chg directly  to the skin and wash                       Gently with a scrungie or clean washcloth.  5.  Apply the CHG Soap to your  body ONLY FROM THE NECK DOWN.   Do not use on face/ open                           Wound or open sores. Avoid contact with eyes, ears mouth and genitals (private parts).                       Wash face,  Genitals (private parts) with your normal soap.             6.  Wash thoroughly, paying special attention to the area where your surgery  will be performed.  7.  Thoroughly rinse your body with warm water from the neck down.  8.  DO NOT shower/wash with your normal soap after using and rinsing off  the CHG Soap.  9.  Pat yourself dry with a clean towel.            10.  Wear clean pajamas.            11.  Place clean sheets on your bed the night of your first shower and do not  sleep with pets. Day of Surgery : Do not apply any lotions/deodorants the morning of surgery.  Please wear clean clothes to the hospital/surgery center.  FAILURE TO FOLLOW THESE INSTRUCTIONS MAY RESULT IN THE CANCELLATION OF YOUR SURGERY PATIENT SIGNATURE_________________________________  NURSE SIGNATURE__________________________________  ________________________________________________________________________   Adam Phenix  An incentive spirometer is a tool that can help keep your lungs clear and active. This tool measures how well you are filling your lungs with each breath. Taking long deep breaths may help reverse or decrease the chance of developing breathing (pulmonary) problems (especially infection) following:  A long period of time when you are unable to move or be active. BEFORE THE PROCEDURE   If the spirometer includes an indicator to show your best effort, your nurse or respiratory therapist will set it to a desired goal.  If possible, sit up straight or lean slightly forward. Try not to slouch.  Hold the incentive spirometer in an upright position. INSTRUCTIONS FOR USE  1. Sit on the edge of your bed if possible, or sit up as far as you can in bed or on a chair. 2. Hold the  incentive spirometer in an upright position. 3. Breathe out normally. 4. Place the mouthpiece in your mouth and seal your lips tightly around it. 5. Breathe in slowly and as deeply as possible, raising the piston or the ball toward the top of the column. 6. Hold your breath for 3-5 seconds or for as long as possible. Allow the piston or ball to fall to the bottom of the column. 7. Remove the mouthpiece from your mouth and breathe out normally. 8. Rest for a few seconds and repeat Steps 1 through 7 at least 10 times every 1-2 hours when you are awake. Take your time and take a few normal breaths between deep breaths. 9. The spirometer may include an indicator to show your best effort. Use the indicator as a goal to work toward during each repetition. 10. After each set of 10 deep breaths, practice coughing to be sure your lungs are clear. If you have an incision (the cut made at the time of surgery), support your incision when coughing by placing a pillow or rolled up towels firmly against it. Once you are able to get out of bed, walk around indoors and cough well. You may stop using the incentive spirometer when instructed by your caregiver.  RISKS AND COMPLICATIONS  Take your time so you do not get dizzy or light-headed.  If you are in pain, you may need to take or ask for pain medication before doing incentive spirometry. It is harder to take a deep breath if you are having pain. AFTER USE  Rest and breathe slowly and easily.  It can be helpful to keep track of a log of your progress. Your caregiver can provide you with a simple table to help with this. If you are using the spirometer at home, follow these instructions: [place] IF:   You are having difficultly using the spirometer.  You have trouble using the spirometer as often as instructed.  Your pain medication is not giving enough relief while using the spirometer.  You develop  fever of 100.5 F (38.1 C) or  higher. SEEK IMMEDIATE MEDICAL CARE IF:   You cough up bloody sputum that had not been present before.  You develop fever of 102 F (38.9 C) or greater.  You develop worsening pain at or near the incision site. MAKE SURE YOU:   Understand these instructions.  Will watch your condition.  Will get help right away if you are not doing well or get worse. Document Released: 12/12/2006 Document Revised: 10/24/2011 Document Reviewed: 02/12/2007 ExitCare Patient Information 2014 Exit[employer], [place].   ________________________________________________________________________  WHAT IS A BLOOD TRANSFUSION? Blood Transfusion Information  A transfusion is the replacement of blood or some of its parts. Blood is made up of multiple cells which provide different functions.  Red blood cells carry oxygen and are used for blood loss replacement.  White blood cells fight against infection.  Platelets control bleeding.  Plasma helps clot blood.  Other blood products are available for specialized needs, such as hemophilia or other clotting disorders. BEFORE THE TRANSFUSION  Who gives blood for transfusions?   Healthy volunteers who are fully evaluated to make sure their blood is safe. This is blood bank blood. Transfusion therapy is the safest it has ever been in the practice of medicine. Before blood is taken from a donor, a complete history is taken to make sure that person has no history of diseases nor engages in risky social behavior (examples are intravenous drug use or sexual activity with multiple partners). The donor's travel history is screened to minimize risk of transmitting infections, such as malaria. The donated blood is tested for signs of infectious diseases, such as HIV and hepatitis. The blood is then tested to be sure it is compatible with you in order to minimize the chance of a transfusion reaction. If you or a relative donates blood, this is often done in anticipation of surgery  and is not appropriate for emergency situations. It takes many days to process the donated blood. RISKS AND COMPLICATIONS Although transfusion therapy is very safe and saves many lives, the main dangers of transfusion include:   Getting an infectious disease.  Developing a transfusion reaction. This is an allergic reaction to something in the blood you were given. Every precaution is taken to prevent this. The decision to have a blood transfusion has been considered carefully by your caregiver before blood is given. Blood is not given unless the benefits outweigh the risks. AFTER THE TRANSFUSION  Right after receiving a blood transfusion, you will usually feel much better and more energetic. This is especially true if your red blood cells have gotten low (anemic). The transfusion raises the level of the red blood cells which carry oxygen, and this usually causes an energy increase.  The nurse administering the transfusion will monitor you carefully for complications. HOME CARE INSTRUCTIONS  No special instructions are needed after a transfusion. You may find your energy is better. Speak with your caregiver about any limitations on activity for underlying diseases you may have. SEEK MEDICAL CARE IF:   Your condition is not improving after your transfusion.  You develop redness or irritation at the intravenous (IV) site. SEEK IMMEDIATE MEDICAL CARE IF:  Any of the following symptoms occur over the next 12 hours:  Shaking chills.  You have a temperature by mouth above 102 F (38.9 C), not controlled by medicine.  Chest, back, or muscle pain.  People around you feel you are not acting correctly or are confused.  Shortness of breath  or difficulty breathing.  Dizziness and fainting.  You get a rash or develop hives.  You have a decrease in urine output.  Your urine turns a dark color or changes to pink, red, or brown. Any of the following symptoms occur over the next 10  days:  You have a temperature by mouth above 102 F (38.9 C), not controlled by medicine.  Shortness of breath.  Weakness after normal activity.  The white part of the eye turns yellow (jaundice).  You have a decrease in the amount of urine or are urinating less often.  Your urine turns a dark color or changes to pink, red, or brown. Document Released: 07/29/2000 Document Revised: 10/24/2011 Document Reviewed: 03/17/2008 [facility] Patient Information 2014 [place], [place].  _______________________________________________________________________

## 2016-10-21 ENCOUNTER — Encounter (HOSPITAL_COMMUNITY): Payer: Self-pay

## 2016-10-21 ENCOUNTER — Encounter (HOSPITAL_COMMUNITY)
Admission: RE | Admit: 2016-10-21 | Discharge: 2016-10-21 | Disposition: A | Discharge status: Home / Self Care | Payer: Medicaid Other | Source: Ambulatory Visit | Attending: Orthopedic Surgery | Admitting: Orthopedic Surgery

## 2016-10-21 DIAGNOSIS — E119 Type 2 diabetes mellitus without complications: Secondary | ICD-10-CM | POA: Insufficient documentation

## 2016-10-21 DIAGNOSIS — I10 Essential (primary) hypertension: Secondary | ICD-10-CM | POA: Insufficient documentation

## 2016-10-21 DIAGNOSIS — Z01810 Encounter for preprocedural cardiovascular examination: Secondary | ICD-10-CM | POA: Insufficient documentation

## 2016-10-21 DIAGNOSIS — Z01812 Encounter for preprocedural laboratory examination: Secondary | ICD-10-CM | POA: Diagnosis present

## 2016-10-21 HISTORY — DX: Unspecified osteoarthritis, unspecified site: M19.90

## 2016-10-21 HISTORY — DX: Anemia, unspecified: D64.9

## 2016-10-21 LAB — PROTIME-INR
INR: 0.98
Prothrombin Time: 13 seconds (ref 11.4–15.2)

## 2016-10-21 LAB — SURGICAL PCR SCREEN
MRSA, PCR: NEGATIVE
STAPHYLOCOCCUS AUREUS: NEGATIVE

## 2016-10-21 LAB — CBC
HCT: 35.8 % — ABNORMAL LOW (ref 36.0–46.0)
Hemoglobin: 11.9 g/dL — ABNORMAL LOW (ref 12.0–15.0)
MCH: 29.2 pg (ref 26.0–34.0)
MCHC: 33.2 g/dL (ref 30.0–36.0)
MCV: 88 fL (ref 78.0–100.0)
PLATELETS: 444 10*3/uL — AB (ref 150–400)
RBC: 4.07 MIL/uL (ref 3.87–5.11)
RDW: 13.4 % (ref 11.5–15.5)
WBC: 7.2 10*3/uL (ref 4.0–10.5)

## 2016-10-21 LAB — ABO/RH: ABO/RH(D): A POS

## 2016-10-21 LAB — COMPREHENSIVE METABOLIC PANEL
ALT: 17 U/L (ref 14–54)
AST: 21 U/L (ref 15–41)
Albumin: 4.4 g/dL (ref 3.5–5.0)
Alkaline Phosphatase: 83 U/L (ref 38–126)
Anion gap: 8 (ref 5–15)
BUN: 19 mg/dL (ref 6–20)
CHLORIDE: 104 mmol/L (ref 101–111)
CO2: 26 mmol/L (ref 22–32)
CREATININE: 0.78 mg/dL (ref 0.44–1.00)
Calcium: 9.9 mg/dL (ref 8.9–10.3)
GFR calc non Af Amer: >60 mL/min (ref >60)
Glucose, Bld: 125 mg/dL — ABNORMAL HIGH (ref 65–99)
Potassium: 3.7 mmol/L (ref 3.5–5.1)
Sodium: 138 mmol/L (ref 135–145)
Total Bilirubin: 0.5 mg/dL (ref 0.3–1.2)
Total Protein: 8 g/dL (ref 6.5–8.1)

## 2016-10-21 LAB — APTT: aPTT: 32 seconds (ref 24–36)

## 2016-10-21 LAB — GLUCOSE, CAPILLARY: GLUCOSE-CAPILLARY: 237 mg/dL — AB (ref 65–99)

## 2016-10-21 NOTE — Progress Notes (Signed)
05/24/2016- noted in [facility] 10/11/2016- noted in [facility] results. 10/18/2016-noted in EPIC-EKG and EKG from Dr. Amil Amen on chart , Pre-operative clearance from Dr. Amil Amen on chart and Labs in EPIC noted- CMP, TSH, Magnesium

## 2016-10-23 ENCOUNTER — Ambulatory Visit: Payer: Self-pay | Admitting: Orthopedic Surgery

## 2016-10-23 NOTE — H&P (Signed)
Roberta Clark DOB: 1961-05-21 Single / Language: Cleophus Molt / Race: Black or African American Female Date of Admission:  10/24/2016  CC:  Left Hip Pain History of Present Illness  The patient is a 56 year old female who comes in  for a preoperative History and Physical. The patient is scheduled for a left total hip arthroplasty (anterior) to be performed by Dr. Dione Plover. [name], MD at [facility] on 10-24-2016. The patient is a 56 year old female who presented with a hip problem. The patient was seen in referral from Park Liter PA-C.The patient reports left hip (worse than right) problems including pain symptoms that have been present for 7 year(s). The symptoms began without any known injury. She recently had back surgery in October and reports that her hip pain has hindered her recovery from the back surgery. Symptoms reported include hip pain, weakness, night pain, stiffness, difficulty flexing hip and difficulty ambulating The patient reports symptoms radiating to the: right groin, left groin and left thigh, lateral and posterior. She reprots that she did have some numbness in her left leg, but that has resolved with the back surgery.The patient feels as if their symptoms are does feel they are worsening. Symptoms are exacerbated by weight bearing and lying on the affected side. Prior to being seen, the patient was previously evaluated by a primary physician. Previous workup for this problem has included hip x-rays (available on the [facility] system). Previous treatment for this problem has included opioid analgesics. She has had problems with her left worse than right hip. It has been quite a while now. Hip is bothering her at all times. It is limiting what she can and cannot do. She is told she has avascular necrosis of the hip. She has severe pain. The hip hurts her at night also. Right hip hurts but to a lesser degree. She is ready to get the hip fixed. They have been treated conservatively in  the past for the above stated problem and despite conservative measures, they continue to have progressive pain and severe functional limitations and dysfunction. They have failed non-operative management including home exercise, medications to include oxycodone. It is felt that they would benefit from undergoing total joint replacement. Risks and benefits of the procedure have been discussed with the patient and they elect to proceed with surgery. There are no active contraindications to surgery such as ongoing infection or rapidly progressive neurological disease.    Problem List/Past Medical Avascular necrosis of hip, left (M87.052)  Anemia  Diabetes Mellitus, Type II  High blood pressure  Hypercholesterolemia  Hemorrhoids  Menopause   Allergies Lipitor *ANTIHYPERLIPIDEMICS*  Rash. MetFORMIN HCl *ANTIDIABETICS*  Nausea.  Family History Diabetes Mellitus  mother and brother Heart Disease  mother Hypertension  mother, sister and brother  Social History Alcohol use  current drinker; drinks beer and hard liquor; only occasionally per week Children  2 Current work status  unemployed Drug/Alcohol Rehab (Currently)  no Drug/Alcohol Rehab (Previously)  no Exercise  Exercises rarely; does running / walking Illicit drug use  no Living situation  live alone Marital status  single Number of flights of stairs before winded  less than 1 Tobacco / smoke exposure  no Tobacco use  current some days smoker; smoke(d) less than 1/2 pack(s) per day  Medication History  Potassium Chloride (10MEQ Capsule ER, Oral) Active. Lisinopril-Hydrochlorothiazide (20-12.5MG  Tablet, Oral) Active. Gemfibrozil (600MG  Tablet, Oral) Active. Gabapentin (300MG  Capsule, Oral) Active. FLUoxetine HCl (20MG  Capsule, Oral) Active. Centrum Silver (  Oral) Active. Aleve PM (220-25MG  Tablet, Oral) Active. Amitriptyline HCl (25MG  Tablet, Oral) Active. Fish Oil (1000MG  Capsule, Oral)  Active. Ibuprofen (800MG  Tablet, Oral) Active. TiZANidine HCl (4MG  Capsule, Oral) Active. Oxycodone-Acetaminophen (5-325MG  Tablet, Oral) Active. Cyclobenzaprine HCl (10MG  Tablet, Oral) Active.  Past Surgical History  Spinal Surgery  Tubal Ligation   Review of Systems General Present- Night Sweats. Not Present- Chills, Fatigue, Fever, Memory Loss, Weight Gain and Weight Loss. Skin Not Present- Eczema, Hives, Itching, Lesions and Rash. HEENT Present- Blurred Vision. Not Present- Dentures, Double Vision, Headache, Hearing Loss, Tinnitus and Visual Loss. Respiratory Not Present- Allergies, Chronic Cough, Coughing up blood, Shortness of breath at rest and Shortness of breath with exertion. Cardiovascular Not Present- Chest Pain, Difficulty Breathing Lying Down, Murmur, Palpitations, Racing/skipping heartbeats and Swelling. Gastrointestinal Present- Loss of appetitie and Nausea. Not Present- Abdominal Pain, Bloody Stool, Constipation, Diarrhea, Difficulty Swallowing, Heartburn, Jaundice and Vomiting. Female Genitourinary Not Present- Blood in Urine, Discharge, Flank Pain, Incontinence, Painful Urination, Urgency, Urinary frequency, Urinary Retention, Urinating at Night and Weak urinary stream. Musculoskeletal Present- Back Pain, Morning Stiffness and Spasms. Not Present- Joint Pain, Joint Swelling, Muscle Pain and Muscle Weakness. Neurological Not Present- Blackout spells, Difficulty with balance, Dizziness, Paralysis, Tremor and Weakness. Psychiatric Not Present- Insomnia.  Vitals  Weight: 124 lb Height: 65in Weight was reported by patient. Height was reported by patient. Body Surface Area: 1.61 m Body Mass Index: 20.63 kg/m  Pulse: 92 (Regular)  BP: 118/78 (Sitting, Left Arm, Standard)   Physical Exam General Mental Status -Alert, cooperative and good historian. General Appearance-pleasant, Not in acute distress. Orientation-Oriented X3. Build &  Nutrition-Well nourished and Well developed.  Head and Neck Head-normocephalic, atraumatic . Neck Global Assessment - supple, no bruit auscultated on the right, no bruit auscultated on the left.  Eye Pupil - Bilateral-Regular and Round. Motion - Bilateral-EOMI.  Chest and Lung Exam Auscultation Breath sounds - clear at anterior chest wall and clear at posterior chest wall. Adventitious sounds - No Adventitious sounds.  Cardiovascular Auscultation Rhythm - Regular rate and rhythm. Heart Sounds - S1 WNL and S2 WNL. Murmurs & Other Heart Sounds - Auscultation of the heart reveals - No Murmurs.  Abdomen Palpation/Percussion Tenderness - Abdomen is non-tender to palpation. Rigidity (guarding) - Abdomen is soft. Auscultation Auscultation of the abdomen reveals - Bowel sounds normal.  Female Genitourinary Note: Not done, not pertinent to present illness   Musculoskeletal Note: She is in no distress. Her left hip can be flexed about 100, minimal internal rotation, 20 external rotation, 20 abduction. Right hip flexion 110, rotation in 20, out 30, abduction 30 with discomfort. She has a significantly antalgic gait pattern on the left.  RADIOGRAPHS AP pelvis, AP and lateral of the left hip showed she has significant osteonecrosis of the hip with some collapse of the femoral head. The osteonecrotic process involves over 50% of the femoral head.   Assessment & Plan Avascular necrosis of hip, left (M87.052)  Note:Surgical Plans: Left Total Hip Replacement - Anterior Approach  Disposition: Wants to look into skilled rehab  PCP: Dr. Mack Hook  IV TXA  Anesthesia Issues: None  Patient was instructed on what medications to stop prior to surgery.  Signed electronically by Ok Edwards, III PA-C

## 2016-10-24 ENCOUNTER — Inpatient Hospital Stay (HOSPITAL_COMMUNITY): Payer: Medicaid Other

## 2016-10-24 ENCOUNTER — Encounter (HOSPITAL_COMMUNITY)
Admission: RE | Disposition: A | Discharge status: Skilled Nursing Facility | Payer: Self-pay | Source: Ambulatory Visit | Attending: Orthopedic Surgery

## 2016-10-24 ENCOUNTER — Inpatient Hospital Stay (HOSPITAL_COMMUNITY)
Admission: RE | Admit: 2016-10-24 | Discharge: 2016-10-26 | DRG: 470 | Disposition: A | Discharge status: Skilled Nursing Facility | Payer: Medicaid Other | Source: Ambulatory Visit | Attending: Orthopedic Surgery | Admitting: Orthopedic Surgery

## 2016-10-24 ENCOUNTER — Encounter (HOSPITAL_COMMUNITY): Payer: Self-pay | Admitting: *Deleted

## 2016-10-24 ENCOUNTER — Inpatient Hospital Stay (HOSPITAL_COMMUNITY): Payer: Medicaid Other | Admitting: Anesthesiology

## 2016-10-24 DIAGNOSIS — M1611 Unilateral primary osteoarthritis, right hip: Principal | ICD-10-CM | POA: Diagnosis present

## 2016-10-24 DIAGNOSIS — M87852 Other osteonecrosis, left femur: Secondary | ICD-10-CM | POA: Diagnosis present

## 2016-10-24 DIAGNOSIS — E119 Type 2 diabetes mellitus without complications: Secondary | ICD-10-CM | POA: Diagnosis present

## 2016-10-24 DIAGNOSIS — F17210 Nicotine dependence, cigarettes, uncomplicated: Secondary | ICD-10-CM | POA: Diagnosis present

## 2016-10-24 DIAGNOSIS — E785 Hyperlipidemia, unspecified: Secondary | ICD-10-CM | POA: Diagnosis present

## 2016-10-24 DIAGNOSIS — I10 Essential (primary) hypertension: Secondary | ICD-10-CM | POA: Diagnosis present

## 2016-10-24 DIAGNOSIS — M87052 Idiopathic aseptic necrosis of left femur: Secondary | ICD-10-CM | POA: Diagnosis present

## 2016-10-24 DIAGNOSIS — Z96649 Presence of unspecified artificial hip joint: Secondary | ICD-10-CM

## 2016-10-24 DIAGNOSIS — M169 Osteoarthritis of hip, unspecified: Secondary | ICD-10-CM | POA: Diagnosis present

## 2016-10-24 HISTORY — PX: TOTAL HIP ARTHROPLASTY: SHX124

## 2016-10-24 LAB — TYPE AND SCREEN
ABO/RH(D): A POS
Antibody Screen: NEGATIVE

## 2016-10-24 LAB — GLUCOSE, CAPILLARY
GLUCOSE-CAPILLARY: 128 mg/dL — AB (ref 65–99)
Glucose-Capillary: 92 mg/dL (ref 65–99)

## 2016-10-24 SURGERY — ARTHROPLASTY, HIP, TOTAL, ANTERIOR APPROACH
Anesthesia: Monitor Anesthesia Care | Site: Hip | Laterality: Left

## 2016-10-24 MED ORDER — SODIUM CHLORIDE 0.9 % IV SOLN
INTRAVENOUS | Status: DC
  Administered 2016-10-24: 23:00:00 via INTRAVENOUS

## 2016-10-24 MED ORDER — METHOCARBAMOL 500 MG PO TABS
500.0000 mg | ORAL_TABLET | Freq: Four times a day (QID) | ORAL | Status: DC | PRN
  Administered 2016-10-25: 500 mg via ORAL
  Filled 2016-10-24: qty 1

## 2016-10-24 MED ORDER — METOCLOPRAMIDE HCL 5 MG/ML IJ SOLN: 5.0000 mg | Freq: Three times a day (TID) | INTRAMUSCULAR | Status: DC | PRN

## 2016-10-24 MED ORDER — PROPOFOL 500 MG/50ML IV EMUL
INTRAVENOUS | Status: DC | PRN
  Administered 2016-10-24: 50 ug/kg/min via INTRAVENOUS

## 2016-10-24 MED ORDER — PROPOFOL 10 MG/ML IV BOLUS
INTRAVENOUS | Status: AC
  Filled 2016-10-24: qty 20

## 2016-10-24 MED ORDER — FLUOXETINE HCL 20 MG PO CAPS
20.0000 mg | ORAL_CAPSULE | Freq: Every day | ORAL | Status: DC
  Administered 2016-10-25 – 2016-10-26 (×2): 20 mg via ORAL
  Filled 2016-10-24 (×2): qty 1

## 2016-10-24 MED ORDER — CYCLOBENZAPRINE HCL 10 MG PO TABS
10.0000 mg | ORAL_TABLET | Freq: Two times a day (BID) | ORAL | Status: DC
  Administered 2016-10-24 – 2016-10-26 (×4): 10 mg via ORAL
  Filled 2016-10-24 (×4): qty 1

## 2016-10-24 MED ORDER — GABAPENTIN 400 MG PO CAPS
1200.0000 mg | ORAL_CAPSULE | Freq: Every day | ORAL | Status: DC
  Administered 2016-10-24 – 2016-10-25 (×2): 1200 mg via ORAL
  Filled 2016-10-24 (×2): qty 3

## 2016-10-24 MED ORDER — MIDAZOLAM HCL 5 MG/5ML IJ SOLN
INTRAMUSCULAR | Status: DC | PRN
  Administered 2016-10-24: 2 mg via INTRAVENOUS

## 2016-10-24 MED ORDER — PROPOFOL 10 MG/ML IV BOLUS
INTRAVENOUS | Status: DC | PRN
  Administered 2016-10-24 (×4): 20 mg via INTRAVENOUS

## 2016-10-24 MED ORDER — AMITRIPTYLINE HCL 25 MG PO TABS
25.0000 mg | ORAL_TABLET | Freq: Every day | ORAL | Status: DC
  Administered 2016-10-24 – 2016-10-25 (×2): 25 mg via ORAL
  Filled 2016-10-24 (×2): qty 1

## 2016-10-24 MED ORDER — CEFAZOLIN SODIUM-DEXTROSE 2-4 GM/100ML-% IV SOLN
2.0000 g | Freq: Four times a day (QID) | INTRAVENOUS | Status: AC
  Administered 2016-10-24 – 2016-10-25 (×2): 2 g via INTRAVENOUS
  Filled 2016-10-24 (×2): qty 100

## 2016-10-24 MED ORDER — DOCUSATE SODIUM 100 MG PO CAPS
100.0000 mg | ORAL_CAPSULE | Freq: Two times a day (BID) | ORAL | Status: DC
  Administered 2016-10-24 – 2016-10-25 (×3): 100 mg via ORAL
  Filled 2016-10-24 (×4): qty 1

## 2016-10-24 MED ORDER — FENTANYL CITRATE (PF) 100 MCG/2ML IJ SOLN
INTRAMUSCULAR | Status: AC
  Administered 2016-10-24: 50 ug via INTRAVENOUS
  Filled 2016-10-24: qty 2

## 2016-10-24 MED ORDER — DEXAMETHASONE SODIUM PHOSPHATE 10 MG/ML IJ SOLN
10.0000 mg | Freq: Once | INTRAMUSCULAR | Status: AC
  Administered 2016-10-25: 10 mg via INTRAVENOUS
  Filled 2016-10-24: qty 1

## 2016-10-24 MED ORDER — ONDANSETRON HCL 4 MG/2ML IJ SOLN: 4.0000 mg | Freq: Four times a day (QID) | INTRAMUSCULAR | Status: DC | PRN

## 2016-10-24 MED ORDER — LACTATED RINGERS IV SOLN
INTRAVENOUS | Status: DC
  Administered 2016-10-24 (×2): via INTRAVENOUS

## 2016-10-24 MED ORDER — BUPIVACAINE HCL (PF) 0.25 % IJ SOLN
INTRAMUSCULAR | Status: DC | PRN
  Administered 2016-10-24: 30 mL

## 2016-10-24 MED ORDER — FENTANYL CITRATE (PF) 100 MCG/2ML IJ SOLN
INTRAMUSCULAR | Status: AC
  Filled 2016-10-24: qty 2

## 2016-10-24 MED ORDER — GABAPENTIN 300 MG PO CAPS
600.0000 mg | ORAL_CAPSULE | ORAL | Status: DC
  Administered 2016-10-25 – 2016-10-26 (×3): 600 mg via ORAL
  Filled 2016-10-24 (×3): qty 2

## 2016-10-24 MED ORDER — PHENYLEPHRINE HCL 10 MG/ML IJ SOLN
INTRAVENOUS | Status: DC | PRN
  Administered 2016-10-24: 40 ug/min via INTRAVENOUS

## 2016-10-24 MED ORDER — CEFAZOLIN SODIUM-DEXTROSE 2-4 GM/100ML-% IV SOLN
2.0000 g | INTRAVENOUS | Status: AC
  Administered 2016-10-24: 2 g via INTRAVENOUS
  Filled 2016-10-24: qty 100

## 2016-10-24 MED ORDER — BUPIVACAINE IN DEXTROSE 0.75-8.25 % IT SOLN
INTRATHECAL | Status: DC | PRN
  Administered 2016-10-24: 1.8 mL via INTRATHECAL

## 2016-10-24 MED ORDER — POLYETHYLENE GLYCOL 3350 17 G PO PACK: 17.0000 g | PACK | Freq: Every day | ORAL | Status: DC | PRN

## 2016-10-24 MED ORDER — MIDAZOLAM HCL 2 MG/2ML IJ SOLN
INTRAMUSCULAR | Status: AC
  Filled 2016-10-24: qty 2

## 2016-10-24 MED ORDER — PHENOL 1.4 % MT LIQD: 1.0000 | OROMUCOSAL | Status: DC | PRN

## 2016-10-24 MED ORDER — HYDROMORPHONE HCL 1 MG/ML IJ SOLN
0.2500 mg | INTRAMUSCULAR | Status: DC | PRN
  Administered 2016-10-24 (×2): 0.5 mg via INTRAVENOUS

## 2016-10-24 MED ORDER — BUPIVACAINE HCL (PF) 0.25 % IJ SOLN
INTRAMUSCULAR | Status: AC
Start: 2016-10-24 — End: 2016-10-24
  Filled 2016-10-24: qty 30

## 2016-10-24 MED ORDER — ACETAMINOPHEN 650 MG RE SUPP: 650.0000 mg | Freq: Four times a day (QID) | RECTAL | Status: DC | PRN

## 2016-10-24 MED ORDER — TRANEXAMIC ACID 1000 MG/10ML IV SOLN
1000.0000 mg | INTRAVENOUS | Status: AC
  Administered 2016-10-24: 1000 mg via INTRAVENOUS
  Filled 2016-10-24: qty 1100

## 2016-10-24 MED ORDER — ACETAMINOPHEN 10 MG/ML IV SOLN
1000.0000 mg | Freq: Once | INTRAVENOUS | Status: AC
  Administered 2016-10-24: 1000 mg via INTRAVENOUS
  Filled 2016-10-24: qty 100

## 2016-10-24 MED ORDER — DEXAMETHASONE SODIUM PHOSPHATE 10 MG/ML IJ SOLN
INTRAMUSCULAR | Status: AC
  Filled 2016-10-24: qty 1

## 2016-10-24 MED ORDER — ONDANSETRON HCL 4 MG/2ML IJ SOLN
INTRAMUSCULAR | Status: AC
  Filled 2016-10-24: qty 2

## 2016-10-24 MED ORDER — FLEET ENEMA 7-19 GM/118ML RE ENEM: 1.0000 | ENEMA | Freq: Once | RECTAL | Status: DC | PRN

## 2016-10-24 MED ORDER — POTASSIUM CHLORIDE CRYS ER 10 MEQ PO TBCR
10.0000 meq | EXTENDED_RELEASE_TABLET | Freq: Every day | ORAL | Status: DC
  Administered 2016-10-25 – 2016-10-26 (×2): 10 meq via ORAL
  Filled 2016-10-24 (×2): qty 1

## 2016-10-24 MED ORDER — DEXTROSE 5 % IV SOLN
500.0000 mg | Freq: Four times a day (QID) | INTRAVENOUS | Status: DC | PRN
  Administered 2016-10-24: 500 mg via INTRAVENOUS
  Filled 2016-10-24: qty 5
  Filled 2016-10-24: qty 550

## 2016-10-24 MED ORDER — LACTATED RINGERS IV SOLN: INTRAVENOUS | Status: DC

## 2016-10-24 MED ORDER — GEMFIBROZIL 600 MG PO TABS
600.0000 mg | ORAL_TABLET | Freq: Two times a day (BID) | ORAL | Status: DC
  Administered 2016-10-25 – 2016-10-26 (×3): 600 mg via ORAL
  Filled 2016-10-24 (×5): qty 1

## 2016-10-24 MED ORDER — DIPHENHYDRAMINE HCL 12.5 MG/5ML PO ELIX
12.5000 mg | ORAL_SOLUTION | ORAL | Status: DC | PRN
  Administered 2016-10-25: 25 mg via ORAL
  Filled 2016-10-24: qty 10

## 2016-10-24 MED ORDER — HYDROMORPHONE HCL 1 MG/ML IJ SOLN
INTRAMUSCULAR | Status: AC
  Administered 2016-10-24: 0.5 mg via INTRAVENOUS
  Filled 2016-10-24: qty 1

## 2016-10-24 MED ORDER — METOCLOPRAMIDE HCL 5 MG PO TABS: 5.0000 mg | ORAL_TABLET | Freq: Three times a day (TID) | ORAL | Status: DC | PRN

## 2016-10-24 MED ORDER — MENTHOL 3 MG MT LOZG: 1.0000 | LOZENGE | OROMUCOSAL | Status: DC | PRN

## 2016-10-24 MED ORDER — ACETAMINOPHEN 325 MG PO TABS: 650.0000 mg | ORAL_TABLET | Freq: Four times a day (QID) | ORAL | Status: DC | PRN

## 2016-10-24 MED ORDER — HYDROMORPHONE HCL 1 MG/ML IJ SOLN
0.5000 mg | INTRAMUSCULAR | Status: AC | PRN
  Administered 2016-10-24 (×2): 0.5 mg via INTRAVENOUS

## 2016-10-24 MED ORDER — CHLORHEXIDINE GLUCONATE 4 % EX LIQD: 60.0000 mL | Freq: Once | CUTANEOUS | Status: DC

## 2016-10-24 MED ORDER — HYDROMORPHONE HCL 1 MG/ML IJ SOLN
INTRAMUSCULAR | Status: AC
Start: 2016-10-24 — End: 2016-10-24
  Administered 2016-10-24: 0.5 mg via INTRAVENOUS
  Filled 2016-10-24: qty 1

## 2016-10-24 MED ORDER — ONDANSETRON HCL 4 MG PO TABS: 4.0000 mg | ORAL_TABLET | Freq: Four times a day (QID) | ORAL | Status: DC | PRN

## 2016-10-24 MED ORDER — RIVAROXABAN 10 MG PO TABS
10.0000 mg | ORAL_TABLET | Freq: Every day | ORAL | Status: DC
  Administered 2016-10-25 – 2016-10-26 (×2): 10 mg via ORAL
  Filled 2016-10-24 (×2): qty 1

## 2016-10-24 MED ORDER — MORPHINE SULFATE (PF) 4 MG/ML IV SOLN
1.0000 mg | INTRAVENOUS | Status: DC | PRN
  Administered 2016-10-25: 1 mg via INTRAVENOUS
  Filled 2016-10-24: qty 1

## 2016-10-24 MED ORDER — BISACODYL 10 MG RE SUPP: 10.0000 mg | Freq: Every day | RECTAL | Status: DC | PRN

## 2016-10-24 MED ORDER — FENTANYL CITRATE (PF) 100 MCG/2ML IJ SOLN
25.0000 ug | INTRAMUSCULAR | Status: DC | PRN
  Administered 2016-10-24 (×3): 50 ug via INTRAVENOUS

## 2016-10-24 MED ORDER — FENTANYL CITRATE (PF) 100 MCG/2ML IJ SOLN
INTRAMUSCULAR | Status: DC | PRN
  Administered 2016-10-24: 50 ug via INTRAVENOUS

## 2016-10-24 MED ORDER — PROMETHAZINE HCL 25 MG/ML IJ SOLN: 6.2500 mg | INTRAMUSCULAR | Status: DC | PRN

## 2016-10-24 MED ORDER — OXYCODONE HCL 5 MG PO TABS
5.0000 mg | ORAL_TABLET | ORAL | Status: DC | PRN
  Administered 2016-10-24 – 2016-10-25 (×3): 10 mg via ORAL
  Filled 2016-10-24 (×3): qty 2

## 2016-10-24 MED ORDER — ACETAMINOPHEN 500 MG PO TABS
1000.0000 mg | ORAL_TABLET | Freq: Four times a day (QID) | ORAL | Status: AC
  Administered 2016-10-24 – 2016-10-25 (×4): 1000 mg via ORAL
  Filled 2016-10-24 (×4): qty 2

## 2016-10-24 MED ORDER — DEXAMETHASONE SODIUM PHOSPHATE 10 MG/ML IJ SOLN
10.0000 mg | Freq: Once | INTRAMUSCULAR | Status: AC
  Administered 2016-10-24: 10 mg via INTRAVENOUS

## 2016-10-24 MED ORDER — TRANEXAMIC ACID 1000 MG/10ML IV SOLN
1000.0000 mg | Freq: Once | INTRAVENOUS | Status: AC
  Administered 2016-10-24: 22:00:00 1000 mg via INTRAVENOUS
  Filled 2016-10-24: qty 1100

## 2016-10-24 MED ORDER — ONDANSETRON HCL 4 MG/2ML IJ SOLN
INTRAMUSCULAR | Status: DC | PRN
  Administered 2016-10-24: 4 mg via INTRAVENOUS

## 2016-10-24 SURGICAL SUPPLY — 36 items
BAG DECANTER FOR FLEXI CONT (MISCELLANEOUS) ×3 IMPLANT
BAG ZIPLOCK 12X15 (MISCELLANEOUS) IMPLANT
BLADE SAG 18X100X1.27 (BLADE) ×3 IMPLANT
CAPT HIP TOTAL 2 ×3 IMPLANT
CLOSURE WOUND 1/2 X4 (GAUZE/BANDAGES/DRESSINGS) ×1
CLOTH BEACON ORANGE TIMEOUT ST (SAFETY) ×3 IMPLANT
COVER PERINEAL POST (MISCELLANEOUS) ×3 IMPLANT
DECANTER SPIKE VIAL GLASS SM (MISCELLANEOUS) ×3 IMPLANT
DRAPE STERI IOBAN 125X83 (DRAPES) ×3 IMPLANT
DRAPE U-SHAPE 47X51 STRL (DRAPES) ×6 IMPLANT
DRSG ADAPTIC 3X8 NADH LF (GAUZE/BANDAGES/DRESSINGS) ×3 IMPLANT
DRSG MEPILEX BORDER 4X4 (GAUZE/BANDAGES/DRESSINGS) ×3 IMPLANT
DRSG MEPILEX BORDER 4X8 (GAUZE/BANDAGES/DRESSINGS) ×3 IMPLANT
DURAPREP 26ML APPLICATOR (WOUND CARE) ×3 IMPLANT
ELECT REM PT RETURN 9FT ADLT (ELECTROSURGICAL) ×3
ELECTRODE REM PT RTRN 9FT ADLT (ELECTROSURGICAL) ×1 IMPLANT
EVACUATOR 1/8 PVC DRAIN (DRAIN) ×3 IMPLANT
GLOVE BIO SURGEON STRL SZ7.5 (GLOVE) ×3 IMPLANT
GLOVE BIO SURGEON STRL SZ8 (GLOVE) ×6 IMPLANT
GLOVE BIOGEL PI IND STRL 8 (GLOVE) ×2 IMPLANT
GLOVE BIOGEL PI INDICATOR 8 (GLOVE) ×4
GOWN STRL REUS W/TWL LRG LVL3 (GOWN DISPOSABLE) ×3 IMPLANT
GOWN STRL REUS W/TWL XL LVL3 (GOWN DISPOSABLE) ×3 IMPLANT
NS IRRIG 1000ML POUR BTL (IV SOLUTION) ×3 IMPLANT
PACK ANTERIOR HIP CUSTOM (KITS) ×3 IMPLANT
STRIP CLOSURE SKIN 1/2X4 (GAUZE/BANDAGES/DRESSINGS) ×2 IMPLANT
SUT ETHIBOND NAB CT1 #1 30IN (SUTURE) ×3 IMPLANT
SUT MNCRL AB 4-0 PS2 18 (SUTURE) ×3 IMPLANT
SUT VIC AB 2-0 CT1 27 (SUTURE) ×4
SUT VIC AB 2-0 CT1 TAPERPNT 27 (SUTURE) ×2 IMPLANT
SUT VLOC 180 0 24IN GS25 (SUTURE) ×3 IMPLANT
SYR 50ML LL SCALE MARK (SYRINGE) IMPLANT
TRAY FOLEY W/METER SILVER 14FR (SET/KITS/TRAYS/PACK) ×3 IMPLANT
TRAY FOLEY W/METER SILVER 16FR (SET/KITS/TRAYS/PACK) IMPLANT
WATER STERILE IRR 1000ML POUR (IV SOLUTION) ×6 IMPLANT
YANKAUER SUCT BULB TIP 10FT TU (MISCELLANEOUS) ×3 IMPLANT

## 2016-10-24 NOTE — Op Note (Signed)
OPERATIVE REPORT- TOTAL HIP ARTHROPLASTY   PREOPERATIVE DIAGNOSIS: Avascular necrosis of the Left hip.   POSTOPERATIVE DIAGNOSIS: Avascular necrosis of the Left  hip.   PROCEDURE: Left total hip arthroplasty, anterior approach.   SURGEON: Gaynelle Arabian, MD   ASSISTANT: Arlee Muslim, PA-C  ANESTHESIA:  Spinal  ESTIMATED BLOOD LOSS:-350 ml   DRAINS: Hemovac x1.   COMPLICATIONS: None   CONDITION: PACU - hemodynamically stable.   BRIEF CLINICAL NOTE: Roberta Clark is a 56 y.o. female who has avascular necrosis of their Left  Hip with collapse and with progressively worsening pain and  dysfunction.The patient has failed nonoperative management and presents for  total hip arthroplasty.   PROCEDURE IN DETAIL: After successful administration of spinal  anesthetic, the traction boots for the [facility] bed were placed on both  feet and the patient was placed onto the [facility] bed, boots placed into the leg  holders. The Left hip was then isolated from the perineum with plastic  drapes and prepped and draped in the usual sterile fashion. ASIS and  greater trochanter were marked and a oblique incision was made, starting  at about 1 cm lateral and 2 cm distal to the ASIS and coursing towards  the anterior cortex of the femur. The skin was cut with a 10 blade  through subcutaneous tissue to the level of the fascia overlying the  tensor fascia lata muscle. The fascia was then incised in line with the  incision at the junction of the anterior third and posterior 2/3rd. The  muscle was teased off the fascia and then the interval between the TFL  and the rectus was developed. The Hohmann retractor was then placed at  the top of the femoral neck over the capsule. The vessels overlying the  capsule were cauterized and the fat on top of the capsule was removed.  A Hohmann retractor was then placed anterior underneath the rectus  femoris to give exposure to the entire anterior capsule. A T-shaped   capsulotomy was performed. The edges were tagged and the femoral head  was identified.       Osteophytes are removed off the superior acetabulum.  The femoral neck was then cut in situ with an oscillating saw. Traction  was then applied to the left lower extremity utilizing the [facility]  traction. The femoral head was then removed. Retractors were placed  around the acetabulum and then circumferential removal of the labrum was  performed. Osteophytes were also removed. Reaming starts at 43 mm to  medialize and  Increased in 2 mm increments to 47 mm. We reamed in  approximately 40 degrees of abduction, 20 degrees anteversion. A 48 mm  pinnacle acetabular shell was then impacted in anatomic position under  fluoroscopic guidance with excellent purchase. We did not need to place  any additional dome screws. A 28 mm neutral +4  marathon liner was then  placed into the acetabular shell.       The femoral lift was then placed along the lateral aspect of the femur  just distal to the vastus ridge. The leg was  externally rotated and capsule  was stripped off the inferior aspect of the femoral neck down to the  level of the lesser trochanter, this was done with electrocautery. The femur was lifted after this was performed. The  leg was then placed in an extended and adducted position essentially delivering the femur. We also removed the capsule superiorly and the piriformis from the  piriformis fossa to gain excellent exposure of the  proximal femur. Rongeur was used to remove some cancellous bone to get  into the lateral portion of the proximal femur for placement of the  initial starter reamer. The starter broaches was placed  the starter broach  and was shown to go down the center of the canal. Broaching  with the  Corail system was then performed starting at size 8, coursing  Up to size 11. A size 11 had excellent torsional and rotational  and axial stability. The trial high offset neck was then  placed  with a 28 + 1.5 trial head. The hip was then reduced. We confirmed that  the stem was in the canal both on AP and lateral x-rays. It also has excellent sizing. The hip was reduced with outstanding stability through full extension and full external rotation.. AP pelvis was taken and the leg lengths were measured and found to be equal. Hip was then dislocated again and the femoral head and neck removed. The  femoral broach was removed. Size 11 Corail stem with a high offset  neck was then impacted into the femur following native anteversion. Has  excellent purchase in the canal. Excellent torsional and rotational and  axial stability. It is confirmed to be in the canal on AP and lateral  fluoroscopic views. The 28 + 1.5 ceramic head was placed and the hip  reduced with outstanding stability. Again AP pelvis was taken and it  confirmed that the leg lengths were equal. The wound was then copiously  irrigated with saline solution and the capsule reattached and repaired  with Ethibond suture. 30 ml of .25% Bupivicaine was  injected into the capsule and into the edge of the tensor fascia lata as well as subcutaneous tissue. The fascia overlying the tensor fascia lata was then closed with a running #1 V-Loc. Subcu was closed with interrupted 2-0 Vicryl and subcuticular running 4-0 Monocryl. Incision was cleaned  and dried. Steri-Strips and a bulky sterile dressing applied. Hemovac  drain was hooked to suction and then the patient was awakened and transported to  recovery in stable condition.        Please note that a surgical assistant was a medical necessity for this procedure to perform it in a safe and expeditious manner. Assistant was necessary to provide appropriate retraction of vital neurovascular structures and to prevent femoral fracture and allow for anatomic placement of the prosthesis.  Gaynelle Arabian, M.D.

## 2016-10-24 NOTE — Interval H&P Note (Signed)
History and Physical Interval Note:  10/24/2016 5:33 PM  Roberta Clark  has presented today for surgery, with the diagnosis of left hip avn  The various methods of treatment have been discussed with the patient and family. After consideration of risks, benefits and other options for treatment, the patient has consented to  Procedure(s): LEFT TOTAL HIP ARTHROPLASTY ANTERIOR APPROACH (Left) as a surgical intervention .  The patient's history has been reviewed, patient examined, no change in status, stable for surgery.  I have reviewed the patient's chart and labs.  Questions were answered to the patient's satisfaction.     Gearlean Alf

## 2016-10-24 NOTE — H&P (View-Only) (Signed)
Roberta Clark DOB: April 12, 1961 Single / Language: Cleophus Molt / Race: Black or African American Female Date of Admission:  10/24/2016  CC:  Left Hip Pain History of Present Illness  The patient is a 56 year old female who comes in  for a preoperative History and Physical. The patient is scheduled for a left total hip arthroplasty (anterior) to be performed by Dr. Dione Plover. [name], MD at [facility] on 10-24-2016. The patient is a 56 year old female who presented with a hip problem. The patient was seen in referral from Park Liter PA-C.The patient reports left hip (worse than right) problems including pain symptoms that have been present for 7 year(s). The symptoms began without any known injury. She recently had back surgery in October and reports that her hip pain has hindered her recovery from the back surgery. Symptoms reported include hip pain, weakness, night pain, stiffness, difficulty flexing hip and difficulty ambulating The patient reports symptoms radiating to the: right groin, left groin and left thigh, lateral and posterior. She reprots that she did have some numbness in her left leg, but that has resolved with the back surgery.The patient feels as if their symptoms are does feel they are worsening. Symptoms are exacerbated by weight bearing and lying on the affected side. Prior to being seen, the patient was previously evaluated by a primary physician. Previous workup for this problem has included hip x-rays (available on the [facility] system). Previous treatment for this problem has included opioid analgesics. She has had problems with her left worse than right hip. It has been quite a while now. Hip is bothering her at all times. It is limiting what she can and cannot do. She is told she has avascular necrosis of the hip. She has severe pain. The hip hurts her at night also. Right hip hurts but to a lesser degree. She is ready to get the hip fixed. They have been treated conservatively in  the past for the above stated problem and despite conservative measures, they continue to have progressive pain and severe functional limitations and dysfunction. They have failed non-operative management including home exercise, medications to include oxycodone. It is felt that they would benefit from undergoing total joint replacement. Risks and benefits of the procedure have been discussed with the patient and they elect to proceed with surgery. There are no active contraindications to surgery such as ongoing infection or rapidly progressive neurological disease.    Problem List/Past Medical Avascular necrosis of hip, left (M87.052)  Anemia  Diabetes Mellitus, Type II  High blood pressure  Hypercholesterolemia  Hemorrhoids  Menopause   Allergies Lipitor *ANTIHYPERLIPIDEMICS*  Rash. MetFORMIN HCl *ANTIDIABETICS*  Nausea.  Family History Diabetes Mellitus  mother and brother Heart Disease  mother Hypertension  mother, sister and brother  Social History Alcohol use  current drinker; drinks beer and hard liquor; only occasionally per week Children  2 Current work status  unemployed Drug/Alcohol Rehab (Currently)  no Drug/Alcohol Rehab (Previously)  no Exercise  Exercises rarely; does running / walking Illicit drug use  no Living situation  live alone Marital status  single Number of flights of stairs before winded  less than 1 Tobacco / smoke exposure  no Tobacco use  current some days smoker; smoke(d) less than 1/2 pack(s) per day  Medication History  Potassium Chloride (10MEQ Capsule ER, Oral) Active. Lisinopril-Hydrochlorothiazide (20-12.5MG  Tablet, Oral) Active. Gemfibrozil (600MG  Tablet, Oral) Active. Gabapentin (300MG  Capsule, Oral) Active. FLUoxetine HCl (20MG  Capsule, Oral) Active. Centrum Silver (  Oral) Active. Aleve PM (220-25MG  Tablet, Oral) Active. Amitriptyline HCl (25MG  Tablet, Oral) Active. Fish Oil (1000MG  Capsule, Oral)  Active. Ibuprofen (800MG  Tablet, Oral) Active. TiZANidine HCl (4MG  Capsule, Oral) Active. Oxycodone-Acetaminophen (5-325MG  Tablet, Oral) Active. Cyclobenzaprine HCl (10MG  Tablet, Oral) Active.  Past Surgical History  Spinal Surgery  Tubal Ligation   Review of Systems General Present- Night Sweats. Not Present- Chills, Fatigue, Fever, Memory Loss, Weight Gain and Weight Loss. Skin Not Present- Eczema, Hives, Itching, Lesions and Rash. HEENT Present- Blurred Vision. Not Present- Dentures, Double Vision, Headache, Hearing Loss, Tinnitus and Visual Loss. Respiratory Not Present- Allergies, Chronic Cough, Coughing up blood, Shortness of breath at rest and Shortness of breath with exertion. Cardiovascular Not Present- Chest Pain, Difficulty Breathing Lying Down, Murmur, Palpitations, Racing/skipping heartbeats and Swelling. Gastrointestinal Present- Loss of appetitie and Nausea. Not Present- Abdominal Pain, Bloody Stool, Constipation, Diarrhea, Difficulty Swallowing, Heartburn, Jaundice and Vomiting. Female Genitourinary Not Present- Blood in Urine, Discharge, Flank Pain, Incontinence, Painful Urination, Urgency, Urinary frequency, Urinary Retention, Urinating at Night and Weak urinary stream. Musculoskeletal Present- Back Pain, Morning Stiffness and Spasms. Not Present- Joint Pain, Joint Swelling, Muscle Pain and Muscle Weakness. Neurological Not Present- Blackout spells, Difficulty with balance, Dizziness, Paralysis, Tremor and Weakness. Psychiatric Not Present- Insomnia.  Vitals  Weight: 124 lb Height: 65in Weight was reported by patient. Height was reported by patient. Body Surface Area: 1.61 m Body Mass Index: 20.63 kg/m  Pulse: 92 (Regular)  BP: 118/78 (Sitting, Left Arm, Standard)   Physical Exam General Mental Status -Alert, cooperative and good historian. General Appearance-pleasant, Not in acute distress. Orientation-Oriented X3. Build &  Nutrition-Well nourished and Well developed.  Head and Neck Head-normocephalic, atraumatic . Neck Global Assessment - supple, no bruit auscultated on the right, no bruit auscultated on the left.  Eye Pupil - Bilateral-Regular and Round. Motion - Bilateral-EOMI.  Chest and Lung Exam Auscultation Breath sounds - clear at anterior chest wall and clear at posterior chest wall. Adventitious sounds - No Adventitious sounds.  Cardiovascular Auscultation Rhythm - Regular rate and rhythm. Heart Sounds - S1 WNL and S2 WNL. Murmurs & Other Heart Sounds - Auscultation of the heart reveals - No Murmurs.  Abdomen Palpation/Percussion Tenderness - Abdomen is non-tender to palpation. Rigidity (guarding) - Abdomen is soft. Auscultation Auscultation of the abdomen reveals - Bowel sounds normal.  Female Genitourinary Note: Not done, not pertinent to present illness   Musculoskeletal Note: She is in no distress. Her left hip can be flexed about 100, minimal internal rotation, 20 external rotation, 20 abduction. Right hip flexion 110, rotation in 20, out 30, abduction 30 with discomfort. She has a significantly antalgic gait pattern on the left.  RADIOGRAPHS AP pelvis, AP and lateral of the left hip showed she has significant osteonecrosis of the hip with some collapse of the femoral head. The osteonecrotic process involves over 50% of the femoral head.   Assessment & Plan Avascular necrosis of hip, left (M87.052)  Note:Surgical Plans: Left Total Hip Replacement - Anterior Approach  Disposition: Wants to look into skilled rehab  PCP: Dr. Mack Hook  IV TXA  Anesthesia Issues: None  Patient was instructed on what medications to stop prior to surgery.  Signed electronically by Ok Edwards, III PA-C

## 2016-10-24 NOTE — Anesthesia Procedure Notes (Signed)
Spinal  Patient location during procedure: OR Start time: 10/24/2016 5:40 PM End time: 10/24/2016 5:46 PM Reason for block: at surgeon's request Staffing Resident/CRNA: BLANTON, SHANNON M Performed: resident/CRNA  Preanesthetic Checklist Completed: patient identified, site marked, surgical consent, pre-op evaluation, timeout performed, IV checked, risks and benefits discussed and monitors and equipment checked Spinal Block Patient position: sitting Prep: DuraPrep Patient monitoring: heart rate, continuous pulse ox and blood pressure Approach: midline Location: L2-3 Injection technique: single-shot Needle Needle type: Whitacre  Needle gauge: 25 G Needle length: 9 cm Assessment Sensory level: T6 Additional Notes Expiration date of kit checked and confirmed. Patient tolerated procedure well, without complications. X 2 first attempt paramedian without success converted to midline with noted clear CSF return. Loss of motor and sensory on exam post injection.      

## 2016-10-24 NOTE — Anesthesia Preprocedure Evaluation (Signed)
Anesthesia Evaluation    Airway Mallampati: II  TM Distance: >3 FB Neck ROM: Full    Dental  (+) Teeth Intact, Dental Advisory Given, Missing   Pulmonary Current Smoker,    Pulmonary exam normal breath sounds clear to auscultation       Cardiovascular hypertension, Pt. on medications Normal cardiovascular exam Rhythm:Regular Rate:Normal     Neuro/Psych PSYCHIATRIC DISORDERS Depression    GI/Hepatic   Endo/Other  diabetes, Type 2, Oral Hypoglycemic Agents  Renal/GU      Musculoskeletal  (+) Arthritis , Osteoarthritis,    Abdominal   Peds  Hematology  (+) Blood dyscrasia, anemia ,   Anesthesia Other Findings   Reproductive/Obstetrics                             Anesthesia Physical Anesthesia Plan  ASA: III  Anesthesia Plan: Spinal and MAC   Post-op Pain Management:    Induction: Intravenous  Airway Management Planned: Simple Face Mask  Additional Equipment:   Intra-op Plan:   Post-operative Plan:   Informed Consent: I have reviewed the patients History and Physical, chart, labs and discussed the procedure including the risks, benefits and alternatives for the proposed anesthesia with the patient or authorized representative who has indicated his/her understanding and acceptance.   Dental advisory given  Plan Discussed with: CRNA, Anesthesiologist and Surgeon  Anesthesia Plan Comments: (Discussed risks and benefits of and differences between spinal and general. Discussed risks of spinal including headache, backache, failure, bleeding, infection, and nerve damage. Patient consents to spinal. Questions answered. Coagulation studies and platelet count acceptable.)        Anesthesia Quick Evaluation

## 2016-10-24 NOTE — Transfer of Care (Signed)
Immediate Anesthesia Transfer of Care Note  Patient: Roberta Clark  Procedure(s) Performed: Procedure(s): LEFT TOTAL HIP ARTHROPLASTY ANTERIOR APPROACH (Left)  Patient Location: PACU  Anesthesia Type:Spinal  Level of Consciousness:  sedated, patient cooperative and responds to stimulation  Airway & Oxygen Therapy:Patient Spontanous Breathing and Patient connected to face mask oxgen  Post-op Assessment:  Report given to PACU RN and Post -op Vital signs reviewed and stable  Post vital signs:  Reviewed and stable  Last Vitals:  Vitals:   10/24/16 1311  BP: (!) 144/93  Pulse: (!) 102  Resp: 18  Temp: 76.1 C    Complications: No apparent anesthesia complications

## 2016-10-25 ENCOUNTER — Encounter (HOSPITAL_COMMUNITY): Payer: Self-pay | Admitting: Orthopedic Surgery

## 2016-10-25 LAB — CBC
HCT: 28.5 % — ABNORMAL LOW (ref 36.0–46.0)
Hemoglobin: 9.5 g/dL — ABNORMAL LOW (ref 12.0–15.0)
MCH: 29.9 pg (ref 26.0–34.0)
MCHC: 33.3 g/dL (ref 30.0–36.0)
MCV: 89.6 fL (ref 78.0–100.0)
PLATELETS: 352 10*3/uL (ref 150–400)
RBC: 3.18 MIL/uL — AB (ref 3.87–5.11)
RDW: 13.5 % (ref 11.5–15.5)
WBC: 13.8 10*3/uL — AB (ref 4.0–10.5)

## 2016-10-25 LAB — BASIC METABOLIC PANEL
ANION GAP: 8 (ref 5–15)
BUN: 17 mg/dL (ref 6–20)
CO2: 24 mmol/L (ref 22–32)
Calcium: 9.2 mg/dL (ref 8.9–10.3)
Chloride: 106 mmol/L (ref 101–111)
Creatinine, Ser: 0.83 mg/dL (ref 0.44–1.00)
GFR calc Af Amer: >60 mL/min (ref >60)
Glucose, Bld: 202 mg/dL — ABNORMAL HIGH (ref 65–99)
POTASSIUM: 3.7 mmol/L (ref 3.5–5.1)
SODIUM: 138 mmol/L (ref 135–145)

## 2016-10-25 MED ORDER — POLYSACCHARIDE IRON COMPLEX 150 MG PO CAPS
150.0000 mg | ORAL_CAPSULE | Freq: Every day | ORAL | Status: DC
  Administered 2016-10-26: 09:00:00 150 mg via ORAL
  Filled 2016-10-25: qty 1

## 2016-10-25 MED ORDER — LIP MEDEX EX OINT
TOPICAL_OINTMENT | CUTANEOUS | Status: AC
  Filled 2016-10-25: qty 7

## 2016-10-25 MED ORDER — OXYCODONE HCL 5 MG PO TABS
5.0000 mg | ORAL_TABLET | ORAL | Status: DC | PRN
  Administered 2016-10-25 – 2016-10-26 (×9): 15 mg via ORAL
  Filled 2016-10-25 (×9): qty 3

## 2016-10-25 MED ORDER — SODIUM CHLORIDE 0.9 % IV BOLUS (SEPSIS)
250.0000 mL | Freq: Once | INTRAVENOUS | Status: AC
  Administered 2016-10-25: 250 mL via INTRAVENOUS

## 2016-10-25 NOTE — Clinical Social Work Placement (Signed)
   CLINICAL SOCIAL WORK PLACEMENT  NOTE  Date:  10/25/2016  Patient Details  Name: MARVETTE SCHAMP MRN: [id number] Date of Birth: [birth year]  Clinical Social Work is seeking post-discharge placement for this patient at the [place] level of care (*CSW will initial, date and re-position this form in  chart as items are completed):  Yes   Patient/family provided with [facility] Work Department's list of facilities offering this level of care within the geographic area requested by the patient (or if unable, by the patient's family).  Yes   Patient/family informed of their freedom to choose among providers that offer the needed level of care, that participate in Medicare, Medicaid or managed care program needed by the patient, have an available bed and are willing to accept the patient.  Yes   Patient/family informed of [facility]'s ownership interest in [facility] and [facility], as well as of the fact that they are under no obligation to receive care at these facilities.  PASRR submitted to EDS on 10/25/16     PASRR number received on 10/25/16     Existing PASRR number confirmed on       FL2 transmitted to all facilities in geographic area requested by pt/family on 10/25/16     FL2 transmitted to all facilities within larger geographic area on       Patient informed that his/her managed care company has contracts with or will negotiate with certain facilities, including the following:        Yes   Patient/family informed of bed offers received.  Patient [facility] bed at [facility]     Physician recommends and patient chooses bed at      Patient to be transferred to [facility] on  .  Patient to be transferred to facility by       Patient family notified on   of transfer.  Name of family member notified:        PHYSICIAN       Additional Comment:    _______________________________________________ Luretha Rued, [place] 10/25/2016, 11:45 AM

## 2016-10-25 NOTE — Progress Notes (Signed)
   Subjective: 1 Day Post-Op Procedure(s) (LRB): LEFT TOTAL HIP ARTHROPLASTY ANTERIOR APPROACH (Left) Patient reports pain as moderate.   Patient seen in rounds for Dr. Wynelle Link on morning rounds. Patient is well, but has had some minor complaints of pain in the hip and thigh, requiring pain medications We will start therapy today.  Plan is to go Skilled nursing facility after hospital stay.  Objective: Vital signs in last 24 hours: Temp:  [97.8 F (36.6 C)-99.4 F (37.4 C)] 99.2 F (37.3 C) (03/13 1503) Pulse Rate:  [78-99] 99 (03/13 1503) Resp:  [12-16] 15 (03/13 1503) BP: (96-132)/(57-73) 132/71 (03/13 1503) SpO2:  [97 %-100 %] 97 % (03/13 1503)  Intake/Output from previous day:  Intake/Output Summary (Last 24 hours) at 10/25/16 2032 Last data filed at 10/25/16 1808  Gross per 24 hour  Intake          2131.25 ml  Output             3310 ml  Net         -1178.75 ml    Intake/Output this shift: No intake/output data recorded.  Labs:  Recent Labs  10/25/16 0448  HGB 9.5*    Recent Labs  10/25/16 0448  WBC 13.8*  RBC 3.18*  HCT 28.5*  PLT 352    Recent Labs  10/25/16 0448  NA 138  K 3.7  CL 106  CO2 24  BUN 17  CREATININE 0.83  GLUCOSE 202*  CALCIUM 9.2   No results for input(s): LABPT, INR in the last 72 hours.  EXAM General - Patient is Alert, Appropriate and Oriented Extremity - Neurovascular intact Sensation intact distally Intact pulses distally Dorsiflexion/Plantar flexion intact Dressing - dressing C/D/I Motor Function - intact, moving foot and toes well on exam.  Hemovac pulled without difficulty.  Past Medical History:  Diagnosis Date  . Anemia   . Arthritis   . Chronic pain   . Depression   . Diabetes mellitus without complication ([place]) 7106   Type II - diet controlled  . Family history of adverse reaction to anesthesia    mother - nausea  . History of anemia   . Hyperlipidemia 2011   Rash with statin:  not well controlled  with Gemfibrozil  . Hypertension 2011  . Insomnia secondary to chronic pain   . Low back pain with left-sided sciatica    Reportedly with lumbar spinal stenosis    Assessment/Plan: 1 Day Post-Op Procedure(s) (LRB): LEFT TOTAL HIP ARTHROPLASTY ANTERIOR APPROACH (Left) Principal Problem:   Avascular necrosis of femoral head, left (HCC) Active Problems:   OA (osteoarthritis) of hip  Estimated body mass index is 20.6 kg/m as calculated from the following:   Height as of this encounter: 5\' 4"  (1.626 m).   Weight as of this encounter: 54.4 kg (120 lb). Advance diet Up with therapy Discharge to SNF when stable and bed avavilable  DVT Prophylaxis - Xarelto Weight Bearing As Tolerated left Leg Hemovac Pulled Begin Therapy  Arlee Muslim, PA-C Orthopaedic Surgery 10/25/2016, 8:32 PM

## 2016-10-25 NOTE — Evaluation (Signed)
Physical Therapy Evaluation Patient Details Name: Roberta Clark MRN: [id number] DOB: 1961/05/29 Today's Date: 10/25/2016   History of Present Illness  s/p L DA THA  Clinical Impression  Pt is s/p THA resulting in the deficits listed below (see PT Problem List). * Pt will benefit from skilled PT to increase their independence and safety with mobility to allow discharge to the venue listed below.  Pt did very well this am despite stating pain was 9/10, reported some improvement with incr activity; pt wants to go tot rehab post acute; will continue to follow     Follow Up Recommendations Home health PT;SNF (dependent on progress in acute setting)    Equipment Recommendations  Rolling walker with 5" wheels    Recommendations for Other Services       Precautions / Restrictions Precautions Precautions: Fall Restrictions Weight Bearing Restrictions: No Other Position/Activity Restrictions: WBAT      Mobility  Bed Mobility Overal bed mobility: Needs Assistance Bed Mobility: Supine to Sit;Sit to Supine     Supine to sit: Supervision Sit to supine: Min assist   General bed mobility comments: fro lines and safety  Transfers Overall transfer level: Needs assistance Equipment used: Rolling walker (2 wheeled) Transfers: Sit to/from Stand Sit to Stand: Supervision;Min guard         General transfer comment: cues for safety  Ambulation/Gait Ambulation/Gait assistance: Min guard;Supervision Ambulation Distance (Feet): 140 Feet Assistive device: Rolling walker (2 wheeled) Gait Pattern/deviations: Step-through pattern;Step-to pattern;Narrow base of support     General Gait Details: cues for RW position, gait progression  Stairs            Wheelchair Mobility    Modified Rankin (Stroke Patients Only)       Balance                                             Pertinent Vitals/Pain Pain Assessment: 0-10 Pain Score: 10-Worst pain ever Pain  Location: L knee (and hip when standing) Pain Descriptors / Indicators: Aching Pain Intervention(s): Limited activity within patient's tolerance;Monitored during session    Home Living Family/patient expects to be discharged to:: Skilled nursing facility Living Arrangements: Alone   Type of Home: Apartment Home Access: Stairs to enter   Entrance Stairs-Number of Steps: 3 front/8 back--with rails Home Layout: One level   Additional Comments: pt wants to go to rehab; if she does not go to rehab she will go to her Mom's home    Prior Function Level of Independence: Independent;Independent with assistive device(s)         Comments: amb with cane prior to surgery     Hand Dominance        Extremity/Trunk Assessment   Upper Extremity Assessment Upper Extremity Assessment: Overall WFL for tasks assessed    Lower Extremity Assessment Lower Extremity Assessment: RLE deficits/detail RLE Deficits / Details: ankle WFL; knee and hip AAROM WFL; strength grossly 2+/5       Communication   Communication: No difficulties  Cognition Arousal/Alertness: Awake/alert Behavior During Therapy: WFL for tasks assessed/performed Overall Cognitive Status: Within Functional Limits for tasks assessed                      General Comments      Exercises Total Joint Exercises Ankle Circles/Pumps: AROM;Both;5 reps Quad Sets: 5 reps;Both;AROM   Assessment/Plan  PT Assessment Patient needs continued PT services  PT Problem List Decreased strength;Decreased activity tolerance;Decreased knowledge of use of DME;Decreased mobility;Pain       PT Treatment Interventions DME instruction;Gait training;Stair training;Functional mobility training;Therapeutic exercise;Therapeutic activities;Patient/family education    PT Goals (Current goals can be found in the Care Plan section)  Acute Rehab PT Goals Patient Stated Goal: go to rehab PT Goal Formulation: With patient Time For Goal  Achievement: 11/01/16 Potential to Achieve Goals: Good    Frequency 7X/week   Barriers to discharge        Co-evaluation               End of Session Equipment Utilized During Treatment: Gait belt Activity Tolerance: Patient tolerated treatment well Patient left: in chair;with call bell/phone within reach   PT Visit Diagnosis: Other abnormalities of gait and mobility (R26.89)         Time: 0998-3382 PT Time Calculation (min) (ACUTE ONLY): 21 min   Charges:   PT Evaluation $PT Eval Low Complexity: 1 Procedure     PT G Codes:         Vir Whetstine 11/15/16, 12:17 PM

## 2016-10-25 NOTE — NC FL2 (Signed)
[place] LEVEL OF CARE SCREENING TOOL     IDENTIFICATION  Patient Name: Roberta Clark Birthdate: 06/08/1961 Sex: female Admission Date (Current Location): 10/24/2016  [facility] and [place] Number:  Herbalist and Address:  [facility],  [place] [address], [place]      Provider Number: [id number]  Attending Physician Name and Address:  Gaynelle Arabian, MD  Relative Name and Phone Number:       Current Level of Care: Hospital Recommended Level of Care: [place] Prior Approval Number:    Date Approved/Denied:   PASRR Number: [id number]  Discharge Plan: SNF    Current Diagnoses: Patient Active Problem List   Diagnosis Date Noted  . Avascular necrosis of femoral head, left ([place]) 10/24/2016  . OA (osteoarthritis) of hip 10/24/2016  . S/P lumbar laminectomy 05/27/2016  . Type 2 diabetes mellitus without complication, without long-term current use of insulin ([place]) 11/03/2015  . Lumbar radiculopathy, chronic 06/14/2013  . Vaginal discharge 08/22/2012  . Hypertension 07/25/2012  . Hyperlipidemia with target LDL less than 100 07/25/2012  . Preventative health care 07/25/2012  . Hot flash, menopausal 07/25/2012  . Chronic low back pain 07/25/2012  . Fibroids 09/02/2011  . Tobacco dependence 09/02/2011  . Hyperlipidemia 08/15/2009    Orientation RESPIRATION BLADDER Height & Weight     Self, Time, Situation, Place  Normal Continent Weight: 120 lb (54.4 kg) Height:  5\' 4"  (162.6 cm)  BEHAVIORAL SYMPTOMS/MOOD NEUROLOGICAL BOWEL NUTRITION STATUS  Other (Comment) (no behaviors)   Continent Diet  AMBULATORY STATUS COMMUNICATION OF NEEDS Skin   Limited Assist Verbally Surgical wounds                       Personal Care Assistance Level of Assistance  Bathing, Feeding, Dressing Bathing Assistance: Limited assistance Feeding assistance: Independent Dressing Assistance: Limited assistance     Functional  Limitations Info  Sight, Speech, Hearing Sight Info: Adequate Hearing Info: Adequate Speech Info: Adequate    SPECIAL CARE FACTORS FREQUENCY  PT (By licensed PT), OT (By licensed OT)     PT Frequency: 5x wk OT Frequency: 5x wk            Contractures Contractures Info: Not present    Additional Factors Info  Code Status, Allergies Code Status Info: Full              Current Medications (10/25/2016):  This is the current hospital active medication list Current Facility-Administered Medications  Medication Dose Route Frequency Provider Last Rate Last Dose  . 0.9 %  sodium chloride infusion   Intravenous Continuous Gaynelle Arabian, MD 75 mL/hr at 10/24/16 2321    . acetaminophen (TYLENOL) tablet 650 mg  650 mg Oral Q6H PRN Gaynelle Arabian, MD       Or  . acetaminophen (TYLENOL) suppository 650 mg  650 mg Rectal Q6H PRN Gaynelle Arabian, MD      . acetaminophen (TYLENOL) tablet 1,000 mg  1,000 mg Oral Q6H Gaynelle Arabian, MD   1,000 mg at 10/25/16 0726  . amitriptyline (ELAVIL) tablet 25 mg  25 mg Oral QHS Gaynelle Arabian, MD   25 mg at 10/24/16 2320  . bisacodyl (DULCOLAX) suppository 10 mg  10 mg Rectal Daily PRN Gaynelle Arabian, MD      . cyclobenzaprine (FLEXERIL) tablet 10 mg  10 mg Oral BID Gaynelle Arabian, MD   10 mg at 10/25/16 0856  . diphenhydrAMINE (BENADRYL) 12.5 MG/5ML elixir  12.5-25 mg  12.5-25 mg Oral Q4H PRN Gaynelle Arabian, MD      . docusate sodium (COLACE) capsule 100 mg  100 mg Oral BID Gaynelle Arabian, MD   100 mg at 10/25/16 0744  . FLUoxetine (PROZAC) capsule 20 mg  20 mg Oral Daily Gaynelle Arabian, MD   20 mg at 10/25/16 0745  . gabapentin (NEURONTIN) capsule 1,200 mg  1,200 mg Oral QHS Berton Mount, RPH   1,200 mg at 10/24/16 2324  . gabapentin (NEURONTIN) capsule 600 mg  600 mg Oral 2 times per day Gaynelle Arabian, MD   600 mg at 10/25/16 0744  . gemfibrozil (LOPID) tablet 600 mg  600 mg Oral BID AC Gaynelle Arabian, MD   600 mg at 10/25/16 0857  . menthol-cetylpyridinium  (CEPACOL) lozenge 3 mg  1 lozenge Oral PRN Gaynelle Arabian, MD       Or  . phenol (CHLORASEPTIC) mouth spray 1 spray  1 spray Mouth/Throat PRN Gaynelle Arabian, MD      . methocarbamol (ROBAXIN) tablet 500 mg  500 mg Oral Q6H PRN Gaynelle Arabian, MD   500 mg at 10/25/16 0435   Or  . methocarbamol (ROBAXIN) 500 mg in dextrose 5 % 50 mL IVPB  500 mg Intravenous Q6H PRN Gaynelle Arabian, MD   500 mg at 10/24/16 1923  . metoCLOPramide (REGLAN) tablet 5-10 mg  5-10 mg Oral Q8H PRN Gaynelle Arabian, MD       Or  . metoCLOPramide (REGLAN) injection 5-10 mg  5-10 mg Intravenous Q8H PRN Gaynelle Arabian, MD      . morphine 4 MG/ML injection 1 mg  1 mg Intravenous Q2H PRN Gaynelle Arabian, MD   1 mg at 10/25/16 0200  . ondansetron (ZOFRAN) tablet 4 mg  4 mg Oral Q6H PRN Gaynelle Arabian, MD       Or  . ondansetron [facility]) injection 4 mg  4 mg Intravenous Q6H PRN Gaynelle Arabian, MD      . oxyCODONE (Oxy IR/ROXICODONE) immediate release tablet 5-15 mg  5-15 mg Oral Q3H PRN Alexzandrew L Perkins, PA-C      . polyethylene glycol (MIRALAX / GLYCOLAX) packet 17 g  17 g Oral Daily PRN Gaynelle Arabian, MD      . potassium chloride (K-DUR,KLOR-CON) CR tablet 10 mEq  10 mEq Oral Daily Gaynelle Arabian, MD   10 mEq at 10/25/16 0744  . rivaroxaban (XARELTO) tablet 10 mg  10 mg Oral Q breakfast Gaynelle Arabian, MD   10 mg at 10/25/16 0745  . sodium phosphate (FLEET) 7-19 GM/118ML enema 1 enema  1 enema Rectal Once PRN Gaynelle Arabian, MD         Discharge Medications: Please see discharge summary for a list of discharge medications.  Relevant Imaging Results:  Relevant Lab Results:   Additional Information SS # [id number]  Hever Castilleja, Randall An, LCSW

## 2016-10-25 NOTE — Anesthesia Postprocedure Evaluation (Signed)
Anesthesia Post Note  Patient: Roberta Clark  Procedure(s) Performed: Procedure(s) (LRB): LEFT TOTAL HIP ARTHROPLASTY ANTERIOR APPROACH (Left)  Patient location during evaluation: PACU Anesthesia Type: Spinal Level of consciousness: oriented and awake and alert Pain management: pain level controlled Vital Signs Assessment: post-procedure vital signs reviewed and stable Respiratory status: spontaneous breathing, respiratory function stable and patient connected to nasal cannula oxygen Cardiovascular status: blood pressure returned to baseline and stable Postop Assessment: no headache, no backache, spinal receding, no signs of nausea or vomiting and patient able to bend at knees Anesthetic complications: no       Last Vitals:  Vitals:   10/25/16 0106 10/25/16 0537  BP: (!) 105/57 109/66  Pulse: 89 89  Resp: 14 14  Temp: 36.7 C 36.6 C    Last Pain:  Vitals:   10/25/16 0537  TempSrc: Oral  PainSc: 4                  Catalina Gravel

## 2016-10-25 NOTE — Evaluation (Signed)
Occupational Therapy Evaluation Patient Details Name: Roberta Clark MRN: [id number] DOB: Jan 08, 1961 Today's Date: 10/25/2016    History of Present Illness s/p L DA THA   Clinical Impression   This 56 year old female was admitted for the above sx. She will benefit from continued OT to increase safety and independence with adls.  Goals in acute are for supervision level.  She needed extra time but could complete ADLs at mod I level prior to admission    Follow Up Recommendations   (pt wants SNF)    Equipment Recommendations  3 in 1 bedside commode;Tub/shower bench    Recommendations for Other Services       Precautions / Restrictions Precautions Precautions: Fall Restrictions Weight Bearing Restrictions: No      Mobility Bed Mobility Overal bed mobility: Needs Assistance Bed Mobility: Supine to Sit;Sit to Supine     Supine to sit: Min assist Sit to supine: Min assist   General bed mobility comments: assist for LLE  Transfers Overall transfer level: Needs assistance Equipment used: Rolling walker (2 wheeled) Transfers: Sit to/from Stand Sit to Stand: Min assist         General transfer comment: assist to rise and steady. Cues for UE/LE placement    Balance                                            ADL Overall ADL's : Needs assistance/impaired Eating/Feeding: Independent   Grooming: Set up;Sitting   Upper Body Bathing: Set up;Sitting   Lower Body Bathing: Moderate assistance;Sit to/from stand   Upper Body Dressing : Set up;Sitting   Lower Body Dressing: Sit to/from stand;Moderate assistance                 General ADL Comments: educated on AE:  pt would benefit from this. She sidestepped up [facility] with min guard once standing.  Wanted to return to bed after assessment     Vision         Perception     Praxis      Pertinent Vitals/Pain Pain Assessment: 0-10 Pain Score: 9  Pain Location: L knee (and hip when  standing) Pain Descriptors / Indicators: Aching Pain Intervention(s): Limited activity within patient's tolerance;Monitored during session;Premedicated before session;Repositioned;Ice applied (heat on knee)     Hand Dominance     Extremity/Trunk Assessment Upper Extremity Assessment Upper Extremity Assessment: Overall WFL for tasks assessed           Communication Communication Communication: No difficulties   Cognition Arousal/Alertness: Awake/alert Behavior During Therapy: WFL for tasks assessed/performed Overall Cognitive Status: Within Functional Limits for tasks assessed                     General Comments       Exercises       Shoulder Instructions      Home Living Family/patient expects to be discharged to:: Unsure Living Arrangements: Alone                               Additional Comments: has standard tub and toilet      Prior Functioning/Environment Level of Independence: Independent with assistive device(s)        Comments: extra time:  ADLs with difficulty        OT Problem  List: Decreased activity tolerance;Pain;Decreased knowledge of use of DME or AE      OT Treatment/Interventions: Self-care/ADL training;DME and/or AE instruction;Patient/family education    OT Goals(Current goals can be found in the care plan section) Acute Rehab OT Goals Patient Stated Goal: go to rehab OT Goal Formulation: With patient Time For Goal Achievement: 11/01/16 Potential to Achieve Goals: Good ADL Goals Pt Will Perform Grooming: with supervision;standing Pt Will Perform Lower Body Bathing: with supervision;with adaptive equipment;sit to/from stand Pt Will Perform Lower Body Dressing: with supervision;sit to/from stand;with adaptive equipment Pt Will Transfer to Toilet: with supervision;ambulating;bedside commode Pt Will Perform Toileting - Clothing Manipulation and hygiene: with supervision;sit to/from stand Pt Will Perform Tub/Shower  Transfer: Tub transfer;with supervision;ambulating;tub bench Additional ADL Goal #1: pt will perform bed mobility at mod I level  OT Frequency: Min 2X/week   Barriers to D/C:            Co-evaluation              End of Session    Activity Tolerance: Patient tolerated treatment well Patient left: in bed  OT Visit Diagnosis: Pain Pain - Right/Left: Left Pain - part of body: Hip;Knee                ADL either performed or assessed with clinical judgement  Time: 0803-0827 OT Time Calculation (min): 24 min Charges:  OT General Charges $OT Visit: 1 Procedure OT Evaluation $OT Eval Low Complexity: 1 Procedure OT Treatments $Self Care/Home Management : 8-22 mins G-Codes:     [place], OTR/L [phone] 10/25/2016  Roberta Clark 10/25/2016, 9:12 AM

## 2016-10-25 NOTE — Clinical Social Work Note (Signed)
Clinical Social Work Assessment  Patient Details  Name: Roberta Clark MRN: [id number] Date of Birth: February 04, 1961  Date of referral:  10/25/16               Reason for consult:  Discharge Planning, Facility Placement                Permission sought to share information with:  Case Manager Permission granted to share information::  Yes, Verbal Permission Granted  Name::        Agency::     Relationship::     Contact Information:     Housing/Transportation Living arrangements for the past 2 months:  Apartment Source of Information:  Patient Patient Interpreter Needed:  None Criminal Activity/Legal Involvement Pertinent to Current Situation/Hospitalization:  No - Comment as needed Significant Relationships:  Adult Children Lives with:  Self Do you feel safe going back to the place where you live?  No (Requesting SNF placement.) Need for family participation in patient care:  No (Coment)  Care giving concerns:  Pt's care cannot be managed at home following hospital d/c.   Social Worker assessment / plan:  Pt hospitalized on 10/24/16 for pre planned LEFT TOTAL HIP ARTHROPLASTY ANTERIOR APPROACH. Pt has requested [facility] at d/c. CSW met with pt to assist with d/c planning. SNF search has been initiated and bed offers provided. Pt has chosen [facility] for [place] placement. CSW will continue to follow to assist with d/c planning needs.  Employment status:  Disabled (Comment on whether or not currently receiving Disability) Insurance information:  Medicaid In [place] PT Recommendations:    Information / Referral to community resources:  [place]  Patient/Family's Response to care:  Pt is in agreement with plan for [employer].  Patient/Family's Understanding of and Emotional Response to Diagnosis, Current Treatment, and Prognosis:  Pt is aware of her medical dx. She is motivated to work with therapy. Pt appreciates assistance provided by CSW.  Emotional Assessment Appearance:   Appears stated age Attitude/Demeanor/Rapport:  Other (cooperative) Affect (typically observed):  Calm, Accepting, Pleasant Orientation:  Oriented to Self, Oriented to Place, Oriented to  Time, Oriented to Situation Alcohol / Substance use:  Not Applicable Psych involvement (Current and /or in the community):  No (Comment)  Discharge Needs  Concerns to be addressed:  Discharge Planning Concerns Readmission within the last 30 days:  No Current discharge risk:  None Barriers to Discharge:  No Barriers Identified   Loraine Maple  [phone] 10/25/2016, 11:37 AM

## 2016-10-25 NOTE — Discharge Instructions (Addendum)
° °Dr. Frank [name] °Total Joint Specialist °[facility] °[address] °[place], [place] [address] °[phone] ° °ANTERIOR APPROACH TOTAL HIP REPLACEMENT POSTOPERATIVE DIRECTIONS ° ° °Hip Rehabilitation, Guidelines Following Surgery  °The results of a hip operation are greatly improved after range of motion and muscle strengthening exercises. Follow all safety measures which are given to protect your hip. If any of these exercises cause increased pain or swelling in your joint, decrease the amount until you are comfortable again. Then slowly increase the exercises. Call your caregiver if you have problems or questions.  ° °HOME CARE INSTRUCTIONS  °Remove items at home which could result in a fall. This includes throw rugs or furniture in walking pathways.  °· ICE to the affected hip every three hours for 30 minutes at a time and then as needed for pain and swelling.  Continue to use ice on the hip for pain and swelling from surgery. You may notice swelling that will progress down to the foot and ankle.  This is normal after surgery.  Elevate the leg when you are not up walking on it.   °· Continue to use the breathing machine which will help keep your temperature down.  It is common for your temperature to cycle up and down following surgery, especially at night when you are not up moving around and exerting yourself.  The breathing machine keeps your lungs expanded and your temperature down. ° ° °DIET °You may resume your previous home diet once your are discharged from the hospital. ° °DRESSING / WOUND CARE / SHOWERING °You may shower 3 days after surgery, but keep the wounds dry during showering.  You may use an occlusive plastic wrap (Press'n Seal for example), NO SOAKING/SUBMERGING IN THE BATHTUB.  If the bandage gets wet, change with a clean dry gauze.  If the incision gets wet, pat the wound dry with a clean towel. °You may start showering once you are discharged home but do not  submerge the incision under water. Just pat the incision dry and apply a dry gauze dressing on daily. °Change the surgical dressing daily and reapply a dry dressing each time. ° °ACTIVITY °Walk with your walker as instructed. °Use walker as long as suggested by your caregivers. °Avoid periods of inactivity such as sitting longer than an hour when not asleep. This helps prevent blood clots.  °You may resume a sexual relationship in one month or when given the OK by your doctor.  °You may return to work once you are cleared by your doctor.  °Do not drive a car for 6 weeks or until released by you surgeon.  °Do not drive while taking narcotics. ° °WEIGHT BEARING °Weight bearing as tolerated with assist device (walker, cane, etc) as directed, use it as long as suggested by your surgeon or therapist, typically at least 4-6 weeks. ° °POSTOPERATIVE CONSTIPATION PROTOCOL °Constipation - defined medically as fewer than three stools per week and severe constipation as less than one stool per week. ° °One of the most common issues patients have following surgery is constipation.  Even if you have a regular bowel pattern at home, your normal regimen is likely to be disrupted due to multiple reasons following surgery.  Combination of anesthesia, postoperative narcotics, change in appetite and fluid intake all can affect your bowels.  In order to avoid complications following surgery, here are some recommendations in order to help you during your recovery period. ° °Colace (docusate) - Pick up an over-the-counter   form of Colace or another stool softener and take twice a day as long as you are requiring postoperative pain medications.  Take with a full glass of water daily.  If you experience loose stools or diarrhea, hold the colace until you stool forms back up.  If your symptoms do not get better within 1 week or if they get worse, check with your doctor. ° °Dulcolax (bisacodyl) - Pick up over-the-counter and take as directed  by the product packaging as needed to assist with the movement of your bowels.  Take with a full glass of water.  Use this product as needed if not relieved by Colace only.  ° °MiraLax (polyethylene glycol) - Pick up over-the-counter to have on hand.  MiraLax is a solution that will increase the amount of water in your bowels to assist with bowel movements.  Take as directed and can mix with a glass of water, juice, soda, coffee, or tea.  Take if you go more than two days without a movement. °Do not use MiraLax more than once per day. Call your doctor if you are still constipated or irregular after using this medication for 7 days in a row. ° °If you continue to have problems with postoperative constipation, please contact the office for further assistance and recommendations.  If you experience "the worst abdominal pain ever" or develop nausea or vomiting, please contact the office immediatly for further recommendations for treatment. ° °ITCHING ° If you experience itching with your medications, try taking only a single pain pill, or even half a pain pill at a time.  You can also use Benadryl over the counter for itching or also to help with sleep.  ° °TED HOSE STOCKINGS °Wear the elastic stockings on both legs for three weeks following surgery during the day but you may remove then at night for sleeping. ° °MEDICATIONS °See your medication summary on the “After Visit Summary” that the nursing staff will review with you prior to discharge.  You may have some home medications which will be placed on hold until you complete the course of blood thinner medication.  It is important for you to complete the blood thinner medication as prescribed by your surgeon.  Continue your approved medications as instructed at time of discharge. ° °PRECAUTIONS °If you experience chest pain or shortness of breath - call 911 immediately for transfer to the hospital emergency department.  °If you develop a fever greater that 101 F,  purulent drainage from wound, increased redness or drainage from wound, foul odor from the wound/dressing, or calf pain - CONTACT YOUR SURGEON.   °                                                °FOLLOW-UP APPOINTMENTS °Make sure you keep all of your appointments after your operation with your surgeon and caregivers. You should call the office at the above phone number and make an appointment for approximately two weeks after the date of your surgery or on the date instructed by your surgeon outlined in the "After Visit Summary". ° °RANGE OF MOTION AND STRENGTHENING EXERCISES  °These exercises are designed to help you keep full movement of your hip joint. Follow your caregiver's or physical therapist's instructions. Perform all exercises about fifteen times, three times per day or as directed. Exercise both hips, even if you   have had only one joint replacement. These exercises can be done on a training (exercise) mat, on the floor, on a table or on a bed. Use whatever works the best and is most comfortable for you. Use music or television while you are exercising so that the exercises are a pleasant break in your day. This will make your life better with the exercises acting as a break in routine you can look forward to.  Lying on your back, slowly slide your foot toward your buttocks, raising your knee up off the floor. Then slowly slide your foot back down until your leg is straight again.  Lying on your back spread your legs as far apart as you can without causing discomfort.  Lying on your side, raise your upper leg and foot straight up from the floor as far as is comfortable. Slowly lower the leg and repeat.  Lying on your back, tighten up the muscle in the front of your thigh (quadriceps muscles). You can do this by keeping your leg straight and trying to raise your heel off the floor. This helps strengthen the largest muscle supporting your knee.  Lying on your back, tighten up the muscles of your  buttocks both with the legs straight and with the knee bent at a comfortable angle while keeping your heel on the floor.   IF YOU ARE TRANSFERRED TO A SKILLED REHAB FACILITY If the patient is transferred to a skilled rehab facility following release from the hospital, a list of the current medications will be sent to the facility for the patient to continue.  When discharged from the skilled rehab facility, please have the facility set up the patient's [place] prior to being released. Also, the skilled facility will be responsible for providing the patient with their medications at time of release from the facility to include their pain medication, the muscle relaxants, and their blood thinner medication. If the patient is still at the rehab facility at time of the two week follow up appointment, the skilled rehab facility will also need to assist the patient in arranging follow up appointment in our office and any transportation needs.  MAKE SURE YOU:  Understand these instructions.  Get help right away if you are not doing well or get worse.    Pick up stool softner and laxative for home use following surgery while on pain medications. Do not submerge incision under water. Please use good hand washing techniques while changing dressing each day. May shower starting three days after surgery. Please use a clean towel to pat the incision dry following showers. Continue to use ice for pain and swelling after surgery. Do not use any lotions or creams on the incision until instructed by your surgeon.  Take Xarelto for two and a half more weeks following discharge from the hospital, then discontinue Xarelto. Once the patient has completed the blood thinner regimen, then take a Baby 81 mg Aspirin daily for three more weeks.    Information on my medicine - XARELTO (Rivaroxaban)  This medication education was reviewed with me or my healthcare representative as part of my  discharge preparation.  The pharmacist that spoke with me during my hospital stay was:  Hershal Coria, [facility]  Why was Xarelto prescribed for you? Xarelto was prescribed for you to reduce the risk of blood clots forming after orthopedic surgery. The medical term for these abnormal blood clots is venous thromboembolism (VTE).  What do you need to know about  xarelto ? Take your Xarelto ONCE DAILY at the same time every day. You may take it either with or without food.  If you have difficulty swallowing the tablet whole, you may crush it and mix in applesauce just prior to taking your dose.  Take Xarelto exactly as prescribed by your doctor and DO NOT stop taking Xarelto without talking to the doctor who prescribed the medication.  Stopping without other VTE prevention medication to take the place of Xarelto may increase your risk of developing a clot.  After discharge, you should have regular check-up appointments with your healthcare provider that is prescribing your Xarelto.    What do you do if you miss a dose? If you miss a dose, take it as soon as you remember on the same day then continue your regularly scheduled once daily regimen the next day. Do not take two doses of Xarelto on the same day.   Important Safety Information A possible side effect of Xarelto is bleeding. You should call your healthcare provider right away if you experience any of the following: ? Bleeding from an injury or your nose that does not stop. ? Unusual colored urine (red or dark brown) or unusual colored stools (red or black). ? Unusual bruising for unknown reasons. ? A serious fall or if you hit your head (even if there is no bleeding).  Some medicines may interact with Xarelto and might increase your risk of bleeding while on Xarelto. To help avoid this, consult your healthcare provider or pharmacist prior to using any new prescription or non-prescription medications, including herbals, vitamins,  non-steroidal anti-inflammatory drugs (NSAIDs) and supplements.  This website has more information on Xarelto: [email or web address]

## 2016-10-25 NOTE — Progress Notes (Signed)
OT Note:  Pt too tired for second OT session this pm.  Issued AE kit. Will return tomorrow.  [place], OTR/L [phone] 10/25/2016

## 2016-10-25 NOTE — Progress Notes (Signed)
   10/25/16 1600  PT Visit Information  Last PT Received On 10/25/16  Pt continues to move well, too fatigued to amb although agreeable to ex's; pain remains "9/10", pt in no apparent distress with bed mobility or exercises; will continue to follow  Assistance Needed +1  History of Present Illness s/p L DA THA  Subjective Data  Patient Stated Goal go to rehab  Precautions  Precautions Fall  Restrictions  Weight Bearing Restrictions No  Other Position/Activity Restrictions WBAT  Pain Assessment  Pain Assessment 0-10  Pain Score 9  Pain Location L knee (and hip when standing)  Pain Descriptors / Indicators Aching  Pain Intervention(s) Limited activity within patient's tolerance;Monitored during session;Premedicated before session;Repositioned  Cognition  Arousal/Alertness Awake/alert  Behavior During Therapy WFL for tasks assessed/performed  Overall Cognitive Status Within Functional Limits for tasks assessed  Bed Mobility  Bed Mobility Supine to Sit;Sit to Supine  Supine to sit Supervision  Sit to supine Min guard  General bed mobility comments light assist for RLE positioning in bed, no assist to transition to sit  Transfers  Overall transfer level Needs assistance  Equipment used Rolling walker (2 wheeled)  Transfers Sit to/from Stand  Sit to Stand Supervision;Min guard  General transfer comment cues for safety; pt bed not working had to stand fro NT to recalibrate bed  Ambulation/Gait  General Gait Details (deferred, pt too tired)  Total Joint Exercises  Ankle Circles/Pumps AROM;Both;10 reps  Quad Sets AROM;Both;10 reps  Heel Slides AAROM;10 reps;Left  Hip ABduction/ADduction AROM;Left;10 reps  PT - End of Session  Activity Tolerance Patient tolerated treatment well  Patient left in bed;with call bell/phone within reach;with bed alarm set  PT - Assessment/Plan  PT Plan Current plan remains appropriate  PT Visit Diagnosis Other abnormalities of gait and mobility  (R26.89)  PT Frequency (ACUTE ONLY) 7X/week  Follow Up Recommendations Home health PT;SNF (pt desires SNF)  PT equipment Rolling walker with 5" wheels  AM-PAC PT "6 Clicks" Daily Activity Outcome Measure  Difficulty turning over in bed (including adjusting bedclothes, sheets and blankets)? 3  Difficulty moving from lying on back to sitting on the side of the bed?  3  Difficulty sitting down on and standing up from a chair with arms (e.g., wheelchair, bedside commode, etc,.)? 3  Help needed moving to and from a bed to chair (including a wheelchair)? 3  Help needed walking in hospital room? 3  Help needed climbing 3-5 steps with a railing?  3  6 Click Score 18  Mobility G Code  CK  PT Goal Progression  Progress towards PT goals Progressing toward goals  Acute Rehab PT Goals  PT Goal Formulation With patient  Time For Goal Achievement 11/01/16  Potential to Achieve Goals Good  PT Time Calculation  PT Start Time (ACUTE ONLY) 1448  PT Stop Time (ACUTE ONLY) 1459  PT Time Calculation (min) (ACUTE ONLY) 11 min  PT General Charges  $$ ACUTE PT VISIT 1 Procedure  PT Treatments  $Therapeutic Exercise 23-37 mins

## 2016-10-26 LAB — BASIC METABOLIC PANEL
Anion gap: 8 (ref 5–15)
BUN: 17 mg/dL (ref 6–20)
CHLORIDE: 107 mmol/L (ref 101–111)
CO2: 27 mmol/L (ref 22–32)
Calcium: 9.3 mg/dL (ref 8.9–10.3)
Creatinine, Ser: 0.74 mg/dL (ref 0.44–1.00)
GFR calc non Af Amer: >60 mL/min (ref >60)
Glucose, Bld: 117 mg/dL — ABNORMAL HIGH (ref 65–99)
POTASSIUM: 3.7 mmol/L (ref 3.5–5.1)
SODIUM: 142 mmol/L (ref 135–145)

## 2016-10-26 LAB — CBC
HEMATOCRIT: 25.1 % — AB (ref 36.0–46.0)
HEMOGLOBIN: 8.4 g/dL — AB (ref 12.0–15.0)
MCH: 29.4 pg (ref 26.0–34.0)
MCHC: 33.5 g/dL (ref 30.0–36.0)
MCV: 87.8 fL (ref 78.0–100.0)
Platelets: 344 10*3/uL (ref 150–400)
RBC: 2.86 MIL/uL — AB (ref 3.87–5.11)
RDW: 13.4 % (ref 11.5–15.5)
WBC: 12.5 10*3/uL — ABNORMAL HIGH (ref 4.0–10.5)

## 2016-10-26 MED ORDER — FLEET ENEMA 7-19 GM/118ML RE ENEM: 1.0000 | ENEMA | Freq: Once | RECTAL | 0 refills | Status: DC | PRN

## 2016-10-26 MED ORDER — DIPHENHYDRAMINE HCL 12.5 MG/5ML PO ELIX: 12.5000 mg | ORAL_SOLUTION | ORAL | 0 refills | Status: DC | PRN

## 2016-10-26 MED ORDER — POLYETHYLENE GLYCOL 3350 17 G PO PACK: 17.0000 g | PACK | Freq: Every day | ORAL | 0 refills | Status: DC | PRN

## 2016-10-26 MED ORDER — ACETAMINOPHEN 325 MG PO TABS: 650.0000 mg | ORAL_TABLET | Freq: Four times a day (QID) | ORAL | 0 refills | Status: DC | PRN

## 2016-10-26 MED ORDER — POLYSACCHARIDE IRON COMPLEX 150 MG PO CAPS: 150.0000 mg | ORAL_CAPSULE | Freq: Every day | ORAL | 0 refills | Status: DC

## 2016-10-26 MED ORDER — OXYCODONE HCL 5 MG PO TABS: 5.0000 mg | ORAL_TABLET | ORAL | 0 refills | Status: DC | PRN

## 2016-10-26 MED ORDER — DOCUSATE SODIUM 100 MG PO CAPS
100.0000 mg | ORAL_CAPSULE | Freq: Two times a day (BID) | ORAL | 0 refills | Status: DC
Start: 2016-10-26 — End: 2016-12-16

## 2016-10-26 MED ORDER — RIVAROXABAN 10 MG PO TABS: 10.0000 mg | ORAL_TABLET | Freq: Every day | ORAL | 0 refills | Status: DC

## 2016-10-26 MED ORDER — BISACODYL 10 MG RE SUPP: 10.0000 mg | Freq: Every day | RECTAL | 0 refills | Status: DC | PRN

## 2016-10-26 NOTE — Care Management Note (Signed)
Case Management Note  Patient Details  Name: Roberta Clark MRN: [id number] Date of Birth: November 23, 1960  Subjective/Objective:                  Avascular necrosisof the [place] Action/Plan: Discharge planning Expected Discharge Date:  10/26/16               Expected Discharge Plan:  [place]  In-House Referral:     Discharge planning Services  CM Consult  Post Acute Care Choice:    Choice offered to:     DME Arranged:    DME Agency:     HH Arranged:    [place] Agency:     Status of Service:  Completed, signed off  If discussed at [employer] of Stay Meetings, dates discussed:    Additional Comments: CM notes plan is for pt to go to SNF; CSW aware and arranging.  No other CM needs were communicated. Dellie Catholic, RN 10/26/2016, 1:17 PM

## 2016-10-26 NOTE — Discharge Summary (Signed)
Physician Discharge Summary   Patient ID: Roberta Clark MRN: [id number] DOB/AGE: [birth year] 56 y.o.  Admit date: 10/24/2016 Discharge date: 10-26-2016  Primary Diagnosis:  Avascular necrosis of the Left hip.  Admission Diagnoses:  Past Medical History:  Diagnosis Date  . Anemia   . Arthritis   . Chronic pain   . Depression   . Diabetes mellitus without complication ([place]) 8453   Type II - diet controlled  . Family history of adverse reaction to anesthesia    mother - nausea  . History of anemia   . Hyperlipidemia 2011   Rash with statin:  not well controlled with Gemfibrozil  . Hypertension 2011  . Insomnia secondary to chronic pain   . Low back pain with left-sided sciatica    Reportedly with lumbar spinal stenosis   Discharge Diagnoses:   Principal Problem:   Avascular necrosis of femoral head, left (HCC) Active Problems:   OA (osteoarthritis) of hip  Estimated body mass index is 20.6 kg/m as calculated from the following:   Height as of this encounter: 5' 4" (1.626 m).   Weight as of this encounter: 54.4 kg (120 lb).  Procedure(s) (LRB): LEFT TOTAL HIP ARTHROPLASTY ANTERIOR APPROACH (Left)   Consults: None  HPI: Roberta Clark is a 56 y.o. female who has avascular necrosis of their Left  Hip with collapse and with progressively worsening pain and  dysfunction.The patient has failed nonoperative management and presents for  total hip arthroplasty.   Laboratory Data: Admission on 10/24/2016  Component Date Value Ref Range Status  . Glucose-Capillary 10/24/2016 128* 65 - 99 mg/dL Final  . Glucose-Capillary 10/24/2016 92  65 - 99 mg/dL Final  . WBC 10/25/2016 13.8* 4.0 - 10.5 K/uL Final  . RBC 10/25/2016 3.18* 3.87 - 5.11 MIL/uL Final  . Hemoglobin 10/25/2016 9.5* 12.0 - 15.0 g/dL Final  . HCT 10/25/2016 28.5* 36.0 - 46.0 % Final  . MCV 10/25/2016 89.6  78.0 - 100.0 fL Final  . MCH 10/25/2016 29.9  26.0 - 34.0 pg Final  . MCHC 10/25/2016 33.3  30.0 - 36.0 g/dL Final   . RDW 10/25/2016 13.5  11.5 - 15.5 % Final  . Platelets 10/25/2016 352  150 - 400 K/uL Final  . Sodium 10/25/2016 138  135 - 145 mmol/L Final  . Potassium 10/25/2016 3.7  3.5 - 5.1 mmol/L Final  . Chloride 10/25/2016 106  101 - 111 mmol/L Final  . CO2 10/25/2016 24  22 - 32 mmol/L Final  . Glucose, Bld 10/25/2016 202* 65 - 99 mg/dL Final  . BUN 10/25/2016 17  6 - 20 mg/dL Final  . Creatinine, Ser 10/25/2016 0.83  0.44 - 1.00 mg/dL Final  . Calcium 10/25/2016 9.2  8.9 - 10.3 mg/dL Final  . GFR calc non Af Amer 10/25/2016 >60  >60 mL/min Final  . GFR calc Af Amer 10/25/2016 >60  >60 mL/min Final   Comment: (NOTE) The eGFR has been calculated using the CKD EPI equation. This calculation has not been validated in all clinical situations. eGFR's persistently <60 mL/min signify possible Chronic Kidney Disease.   . Anion gap 10/25/2016 8  5 - 15 Final  . WBC 10/26/2016 12.5* 4.0 - 10.5 K/uL Final  . RBC 10/26/2016 2.86* 3.87 - 5.11 MIL/uL Final  . Hemoglobin 10/26/2016 8.4* 12.0 - 15.0 g/dL Final  . HCT 10/26/2016 25.1* 36.0 - 46.0 % Final  . MCV 10/26/2016 87.8  78.0 - 100.0 fL Final  . [facility] 10/26/2016 29.4  26.0 - 34.0 pg Final  . MCHC 10/26/2016 33.5  30.0 - 36.0 g/dL Final  . RDW 10/26/2016 13.4  11.5 - 15.5 % Final  . Platelets 10/26/2016 344  150 - 400 K/uL Final  . Sodium 10/26/2016 142  135 - 145 mmol/L Final  . Potassium 10/26/2016 3.7  3.5 - 5.1 mmol/L Final  . Chloride 10/26/2016 107  101 - 111 mmol/L Final  . CO2 10/26/2016 27  22 - 32 mmol/L Final  . Glucose, Bld 10/26/2016 117* 65 - 99 mg/dL Final  . BUN 10/26/2016 17  6 - 20 mg/dL Final  . Creatinine, Ser 10/26/2016 0.74  0.44 - 1.00 mg/dL Final  . Calcium 10/26/2016 9.3  8.9 - 10.3 mg/dL Final  . GFR calc non Af Amer 10/26/2016 >60  >60 mL/min Final  . GFR calc Af Amer 10/26/2016 >60  >60 mL/min Final   Comment: (NOTE) The eGFR has been calculated using the CKD EPI equation. This calculation has not been validated in  all clinical situations. eGFR's persistently <60 mL/min signify possible Chronic Kidney Disease.   Georgiann Hahn gap 10/26/2016 8  5 - 15 Final  Hospital Outpatient Visit on 10/21/2016  Component Date Value Ref Range Status  . Glucose-Capillary 10/21/2016 237* 65 - 99 mg/dL Final  . MRSA, PCR 10/21/2016 NEGATIVE  NEGATIVE Final  . Staphylococcus aureus 10/21/2016 NEGATIVE  NEGATIVE Final   Comment:        The Xpert SA Assay (FDA approved for NASAL specimens in patients over 74 years of age), is one component of a comprehensive surveillance program.  Test performance has been validated by [facility] for patients greater than or equal to 44 year old. It is not intended to diagnose infection nor to guide or monitor treatment.   Marland Kitchen aPTT 10/21/2016 32  24 - 36 seconds Final  . WBC 10/21/2016 7.2  4.0 - 10.5 K/uL Final  . RBC 10/21/2016 4.07  3.87 - 5.11 MIL/uL Final  . Hemoglobin 10/21/2016 11.9* 12.0 - 15.0 g/dL Final  . HCT 10/21/2016 35.8* 36.0 - 46.0 % Final  . MCV 10/21/2016 88.0  78.0 - 100.0 fL Final  . MCH 10/21/2016 29.2  26.0 - 34.0 pg Final  . MCHC 10/21/2016 33.2  30.0 - 36.0 g/dL Final  . RDW 10/21/2016 13.4  11.5 - 15.5 % Final  . Platelets 10/21/2016 444* 150 - 400 K/uL Final  . Sodium 10/21/2016 138  135 - 145 mmol/L Final  . Potassium 10/21/2016 3.7  3.5 - 5.1 mmol/L Final  . Chloride 10/21/2016 104  101 - 111 mmol/L Final  . CO2 10/21/2016 26  22 - 32 mmol/L Final  . Glucose, Bld 10/21/2016 125* 65 - 99 mg/dL Final  . BUN 10/21/2016 19  6 - 20 mg/dL Final  . Creatinine, Ser 10/21/2016 0.78  0.44 - 1.00 mg/dL Final  . Calcium 10/21/2016 9.9  8.9 - 10.3 mg/dL Final  . Total Protein 10/21/2016 8.0  6.5 - 8.1 g/dL Final  . Albumin 10/21/2016 4.4  3.5 - 5.0 g/dL Final  . AST 10/21/2016 21  15 - 41 U/L Final  . ALT 10/21/2016 17  14 - 54 U/L Final  . Alkaline Phosphatase 10/21/2016 83  38 - 126 U/L Final  . Total Bilirubin 10/21/2016 0.5  0.3 - 1.2 mg/dL Final  . GFR  calc non Af Amer 10/21/2016 >60  >60 mL/min Final  . GFR calc Af Amer 10/21/2016 >60  >60 mL/min Final   Comment: (  NOTE) The eGFR has been calculated using the CKD EPI equation. This calculation has not been validated in all clinical situations. eGFR's persistently <60 mL/min signify possible Chronic Kidney Disease.   . Anion gap 10/21/2016 8  5 - 15 Final  . Prothrombin Time 10/21/2016 13.0  11.4 - 15.2 seconds Final  . INR 10/21/2016 0.98   Final  . ABO/RH(D) 10/21/2016 A POS   Final  . Antibody Screen 10/21/2016 NEG   Final  . Sample Expiration 10/21/2016 10/27/2016   Final  . Extend sample reason 10/21/2016 NO TRANSFUSIONS OR PREGNANCY IN THE PAST 3 MONTHS   Final  . ABO/RH(D) 10/21/2016 A POS   Final  Office Visit on 10/18/2016  Component Date Value Ref Range Status  . Glucose 10/18/2016 115* 65 - 99 mg/dL Final  . BUN 10/18/2016 20  6 - 24 mg/dL Final  . Creatinine, Ser 10/18/2016 0.72  0.57 - 1.00 mg/dL Final  . GFR calc non Af Amer 10/18/2016 94  >59 mL/min/1.73 Final  . GFR calc Af Amer 10/18/2016 108  >59 mL/min/1.73 Final  . BUN/Creatinine Ratio 10/18/2016 28* 9 - 23 Final  . Sodium 10/18/2016 141  134 - 144 mmol/L Final  . Potassium 10/18/2016 4.8  3.5 - 5.2 mmol/L Final  . Chloride 10/18/2016 98  96 - 106 mmol/L Final  . CO2 10/18/2016 26  18 - 29 mmol/L Final  . Calcium 10/18/2016 10.5* 8.7 - 10.2 mg/dL Final  . Total Protein 10/18/2016 7.9  6.0 - 8.5 g/dL Final  . Albumin 10/18/2016 5.1  3.5 - 5.5 g/dL Final  . Globulin, Total 10/18/2016 2.8  1.5 - 4.5 g/dL Final  . Albumin/Globulin Ratio 10/18/2016 1.8  1.2 - 2.2 Final  . Bilirubin Total 10/18/2016 <0.2  0.0 - 1.2 mg/dL Final  . Alkaline Phosphatase 10/18/2016 90  39 - 117 IU/L Final  . AST 10/18/2016 19  0 - 40 IU/L Final  . ALT 10/18/2016 21  0 - 32 IU/L Final  . TSH 10/18/2016 0.708  0.450 - 4.500 uIU/mL Final  . Magnesium 10/18/2016 1.9  1.6 - 2.3 mg/dL Final  Lab on 10/11/2016  Component Date Value Ref  Range Status  . Hgb A1c MFr Bld 10/11/2016 6.2* 4.8 - 5.6 % Final   Comment:          Pre-diabetes: 5.7 - 6.4          Diabetes: >6.4          Glycemic control for adults with diabetes: <7.0   . Cholesterol, Total 10/11/2016 235* 100 - 199 mg/dL Final  . Triglycerides 10/11/2016 242* 0 - 149 mg/dL Final  . HDL 10/11/2016 36* >39 mg/dL Final  . VLDL Cholesterol Cal 10/11/2016 48* 5 - 40 mg/dL Final  . LDL Calculated 10/11/2016 151* 0 - 99 mg/dL Final     X-Rays:Dg Pelvis Portable  Result Date: 10/24/2016 CLINICAL DATA:  Postop left hip EXAM: PORTABLE PELVIS 1-2 VIEWS COMPARISON:  07/13/2016 FINDINGS: Mild to moderate arthritis of the right hip. Pubic symphysis appears intact. Patient is status post left hip replacement. The hardware appears intact. Alignment within normal limits. Soft tissue lateral left hip consistent with postoperative change. IMPRESSION: Status post left hip replacement with expected postsurgical changes Electronically Signed   By: Donavan Foil M.D.   On: 10/24/2016 19:53   Dg C-arm 1-60 Min-no Report  Result Date: 10/24/2016 Fluoroscopy was utilized by the requesting physician.  No radiographic interpretation.    EKG: Orders placed or  performed during the hospital encounter of 10/21/16  . EKG 12 lead  . EKG 12 lead     Hospital Course: Patient was admitted to [facility] and taken to the OR and underwent the above state procedure without complications.  Patient tolerated the procedure well and was later transferred to the recovery room and then to the orthopaedic floor for postoperative care.  They were given PO and IV analgesics for pain control following their surgery.  They were given 24 hours of postoperative antibiotics of  Anti-infectives    Start     Dose/Rate Route Frequency Ordered Stop   10/25/16 0600  ceFAZolin (ANCEF) IVPB 2g/100 mL premix     2 g 200 mL/hr over 30 Minutes Intravenous On call to O.R. 10/24/16 1310 10/24/16 1750   10/25/16  0000  ceFAZolin (ANCEF) IVPB 2g/100 mL premix     2 g 200 mL/hr over 30 Minutes Intravenous Every 6 hours 10/24/16 2120 10/25/16 0756     and started on DVT prophylaxis in the form of Xarelto.   PT and OT were ordered for total hip protocol.  The patient was allowed to be WBAT with therapy. Discharge planning was consulted to help with postop disposition and equipment needs. Social worker consulted to assist with placement of the patient into a skilled facility postoperatively. Patient had a tough night on the evening of surgery with pain.  They started to get up OOB with therapy on day one.  Hemovac drain was pulled without difficulty.  Continued to work with therapy into day two.  Dressing was changed on day two and the incision was healing well. Patient was seen in rounds on POD 2 by Dr. Wynelle Link and it was noted that she was ready to go to the SNF of choice.  Discharge to SNF Diet - Cardiac diet and Diabetic diet Follow up - in 2 weeks Activity - WBAT Disposition - Skilled nursing facility Condition Upon Discharge - Stable D/C Meds - See DC Summary DVT Prophylaxis - Xarelto   Discharge Instructions    Call MD / Call 911    Complete by:  As directed    If you experience chest pain or shortness of breath, CALL 911 and be transported to the hospital emergency room.  If you develope a fever above 101 F, pus (white drainage) or increased drainage or redness at the wound, or calf pain, call your surgeon's office.   Change dressing    Complete by:  As directed    You may change your dressing dressing daily with sterile 4 x 4 inch gauze dressing and paper tape.  Do not submerge the incision under water.   Constipation Prevention    Complete by:  As directed    Drink plenty of fluids.  Prune juice may be helpful.  You may use a stool softener, such as Colace (over the counter) 100 mg twice a day.  Use MiraLax (over the counter) for constipation as needed.   Diet - low sodium heart healthy     Complete by:  As directed    Diet Carb Modified    Complete by:  As directed    Discharge instructions    Complete by:  As directed    Pick up stool softner and laxative for home use following surgery while on pain medications. Do not submerge incision under water. Please use good hand washing techniques while changing dressing each day. May shower starting three days after surgery. Please use a  clean towel to pat the incision dry following showers. Continue to use ice for pain and swelling after surgery. Do not use any lotions or creams on the incision until instructed by your surgeon.  Wear both TED hose on both legs during the day every day for three weeks, but may have off at night at home.  Postoperative Constipation Protocol  Constipation - defined medically as fewer than three stools per week and severe constipation as less than one stool per week.  One of the most common issues patients have following surgery is constipation.  Even if you have a regular bowel pattern at home, your normal regimen is likely to be disrupted due to multiple reasons following surgery.  Combination of anesthesia, postoperative narcotics, change in appetite and fluid intake all can affect your bowels.  In order to avoid complications following surgery, here are some recommendations in order to help you during your recovery period.  Colace (docusate) - Pick up an over-the-counter form of Colace or another stool softener and take twice a day as long as you are requiring postoperative pain medications.  Take with a full glass of water daily.  If you experience loose stools or diarrhea, hold the colace until you stool forms back up.  If your symptoms do not get better within 1 week or if they get worse, check with your doctor.  Dulcolax (bisacodyl) - Pick up over-the-counter and take as directed by the product packaging as needed to assist with the movement of your bowels.  Take with a full glass of water.  Use  this product as needed if not relieved by Colace only.   MiraLax (polyethylene glycol) - Pick up over-the-counter to have on hand.  MiraLax is a solution that will increase the amount of water in your bowels to assist with bowel movements.  Take as directed and can mix with a glass of water, juice, soda, coffee, or tea.  Take if you go more than two days without a movement. Do not use MiraLax more than once per day. Call your doctor if you are still constipated or irregular after using this medication for 7 days in a row.  If you continue to have problems with postoperative constipation, please contact the office for further assistance and recommendations.  If you experience "the worst abdominal pain ever" or develop nausea or vomiting, please contact the office immediatly for further recommendations for treatment.   Take Xarelto for two and a half more weeks, then discontinue Xarelto. Once the patient has completed the blood thinner regimen, then take a Baby 81 mg Aspirin daily for three more weeks.   When discharged from the skilled rehab facility, please have the facility set up the patient's [place] prior to being released.  Please make sure this gets set up prior to release in order to avoid any lapse of therapy following the rehab stay.  Also provide the patient with their medications at time of release from the facility to include their pain medication, the muscle relaxants, and their blood thinner medication.  If the patient is still at the rehab facility at time of follow up appointment, please also assist the patient in arranging follow up appointment in our office and any transportation needs. ICE to the affected knee or hip every three hours for 30 minutes at a time and then as needed for pain and swelling.     Do not sit on low chairs, stoools or toilet seats, as it may  be difficult to get up from low surfaces    Complete by:  As directed    Driving restrictions     Complete by:  As directed    No driving until released by the physician.   Increase activity slowly as tolerated    Complete by:  As directed    Lifting restrictions    Complete by:  As directed    No lifting until released by the physician.   Patient may shower    Complete by:  As directed    You may shower without a dressing once there is no drainage.  Do not wash over the wound.  If drainage remains, do not shower until drainage stops.   TED hose    Complete by:  As directed    Use stockings (TED hose) for 3 weeks on both leg(s).  You may remove them at night for sleeping.   Weight bearing as tolerated    Complete by:  As directed    Laterality:  left   Extremity:  Lower     Allergies as of 10/26/2016      Reactions   Metformin And Related Nausea And Vomiting   Lipitor [atorvastatin] Rash      Medication List    STOP taking these medications   ALEVE PM 220-25 MG Tabs Generic drug:  Naproxen Sod-Diphenhydramine   Fish Oil 1000 MG Caps   ibuprofen 800 MG tablet Commonly known as:  ADVIL,MOTRIN   multivitamin with minerals Tabs tablet   oxyCODONE-acetaminophen 5-325 MG tablet Commonly known as:  PERCOCET/ROXICET     TAKE these medications   acetaminophen 325 MG tablet Commonly known as:  TYLENOL Take 2 tablets (650 mg total) by mouth every 6 (six) hours as needed for mild pain (or Fever >/= 101).   amitriptyline 25 MG tablet Commonly known as:  ELAVIL TAKE ONE TABLET BY MOUTH AT BEDTIME AS NEEDED FOR SLEEP What changed:  See the new instructions.   bisacodyl 10 MG suppository Commonly known as:  DULCOLAX Place 1 suppository (10 mg total) rectally daily as needed for moderate constipation.   cyclobenzaprine 10 MG tablet Commonly known as:  FLEXERIL Take 10 mg by mouth 2 (two) times daily.   diphenhydrAMINE 12.5 MG/5ML elixir Commonly known as:  BENADRYL Take 5-10 mLs (12.5-25 mg total) by mouth every 4 (four) hours as needed for itching.   docusate  sodium 100 MG capsule Commonly known as:  COLACE Take 1 capsule (100 mg total) by mouth 2 (two) times daily.   FLUoxetine 20 MG capsule Commonly known as:  PROZAC TAKE ONE CAPSULE BY MOUTH ONCE DAILY   gabapentin 300 MG capsule Commonly known as:  NEURONTIN Take 1 capsule (300 mg total) by mouth 3 (three) times daily. Take two capsules in the AM, two at lunch, and 4 at bedtime. What changed:  how much to take  additional instructions   gemfibrozil 600 MG tablet Commonly known as:  LOPID 1 tab by mouth twice daily with meals   iron polysaccharides 150 MG capsule Commonly known as:  NIFEREX Take 1 capsule (150 mg total) by mouth daily.   lisinopril-hydrochlorothiazide 20-12.5 MG tablet Commonly known as:  PRINZIDE,ZESTORETIC Take 1 tablet by mouth daily.   oxyCODONE 5 MG immediate release tablet Commonly known as:  Oxy IR/ROXICODONE Take 1-3 tablets (5-15 mg total) by mouth every 4 (four) hours as needed for moderate pain or severe pain.   polyethylene glycol packet Commonly known as:  MIRALAX /  GLYCOLAX Take 17 g by mouth daily as needed for mild constipation.   potassium chloride 10 MEQ tablet Commonly known as:  K-DUR,KLOR-CON 1 tab by mouth daily What changed:  how much to take  how to take this  when to take this  additional instructions   rivaroxaban 10 MG Tabs tablet Commonly known as:  XARELTO Take 1 tablet (10 mg total) by mouth daily with breakfast. Take Xarelto for two and a half more weeks following discharge from the hospital, then discontinue Xarelto. Once the patient has completed the blood thinner regimen, then take a Baby 81 mg Aspirin daily for three more weeks.   sodium phosphate 7-19 GM/118ML Enem Place 133 mLs (1 enema total) rectally once as needed for severe constipation.   tiZANidine 4 MG capsule Commonly known as:  ZANAFLEX Take 4 mg by mouth every 8 (eight) hours as needed.      Follow-up Information    Gearlean Alf, MD.  Schedule an appointment as soon as possible for a visit on 11/08/2016.   Specialty:  Orthopedic Surgery Contact information: [address] [place] [address] [phone]           Signed: Arlee Muslim, PA-C Orthopaedic Surgery 10/26/2016, 7:56 AM

## 2016-10-26 NOTE — Clinical Social Work Placement (Signed)
   CLINICAL SOCIAL WORK PLACEMENT  NOTE  Date:  10/26/2016  Patient Details  Name: Roberta Clark MRN: [id number] Date of Birth: Jan 01, 1961  Clinical Social Work is seeking post-discharge placement for this patient at the [employer] level of care (*CSW will initial, date and re-position this form in  chart as items are completed):  Yes   Patient/family provided with [place] Work Department's list of facilities offering this level of care within the geographic area requested by the patient (or if unable, by the patient's family).  Yes   Patient/family informed of their freedom to choose among providers that offer the needed level of care, that participate in Medicare, Medicaid or managed care program needed by the patient, have an available bed and are willing to accept the patient.  Yes   Patient/family informed of [facility]'s ownership interest in [facility] and [facility], as well as of the fact that they are under no obligation to receive care at these facilities.  PASRR submitted to EDS on 10/25/16     PASRR number received on 10/25/16     Existing PASRR number confirmed on       FL2 transmitted to all facilities in geographic area requested by pt/family on 10/25/16     FL2 transmitted to all facilities within larger geographic area on       Patient informed that his/her managed care company has contracts with or will negotiate with certain facilities, including the following:        Yes   Patient/family informed of bed offers received.  Patient [facility] bed at [facility]     Physician recommends and patient chooses bed at      Patient to be transferred to [facility] on 10/26/16.  Patient to be transferred to facility by [facility]     Patient family notified on 10/26/16 of transfer.  Name of family member notified:  Pt contacted family directly     PHYSICIAN       Additional Comment: Pt is in agreement with d/c to [facility]  today. Pt requesting to transport by car. D/C Summary sent to SNF for review. Scripts included in d/c packet. # for report provided to nesg. D/C packet provided to pt.   _______________________________________________ Luretha Rued, [place]  [phone] 10/26/2016, 10:03 AM

## 2016-10-26 NOTE — Progress Notes (Signed)
Physical Therapy Treatment Patient Details Name: Roberta Clark MRN: [id number] DOB: Sep 04, 1960 Today's Date: 10/26/2016    History of Present Illness s/p L DA THA (Simultaneous filing. User may not have seen previous data.)    PT Comments    Pt continues to progress well; planning for SNF today  Follow Up Recommendations  SNF     Equipment Recommendations  Rolling walker with 5" wheels    Recommendations for Other Services       Precautions / Restrictions Precautions Precautions: Fall (Simultaneous filing. User may not have seen previous data.) Restrictions Weight Bearing Restrictions: No Other Position/Activity Restrictions: WBAT (Simultaneous filing. User may not have seen previous data.)    Mobility  Bed Mobility Overal bed mobility: Needs Assistance Bed Mobility: Supine to Sit;Sit to Supine     Supine to sit: Supervision (Simultaneous filing. User may not have seen previous data.) Sit to supine: Supervision (Simultaneous filing. User may not have seen previous data.)   General bed mobility comments: incr time to complete task but able to perform without physical assist (Simultaneous filing. User may not have seen previous data.)  Transfers Overall transfer level: Needs assistance Equipment used: Rolling walker (2 wheeled) Transfers: Sit to/from Stand Sit to Stand: Supervision         General transfer comment: for safety  Ambulation/Gait Ambulation/Gait assistance: Min guard;Supervision Ambulation Distance (Feet): 160 Feet Assistive device: Rolling walker (2 wheeled) Gait Pattern/deviations: Step-through pattern;Step-to pattern;Narrow base of support     General Gait Details: cues for posture, step length   Stairs            Wheelchair Mobility    Modified Rankin (Stroke Patients Only)       Balance                                    Cognition Arousal/Alertness: Awake/alert (Simultaneous filing. User may not have seen  previous data.) Behavior During Therapy: [facility] for tasks assessed/performed (Simultaneous filing. User may not have seen previous data.) Overall Cognitive Status: Within Functional Limits for tasks assessed (Simultaneous filing. User may not have seen previous data.)                      Exercises Total Joint Exercises Ankle Circles/Pumps:  (deferred d/t pain)    General Comments        Pertinent Vitals/Pain Pain Assessment: 0-10 Pain Score: 8  (Simultaneous filing. User may not have seen previous data.) Pain Location: L knee (and hip when standing) (Simultaneous filing. User may not have seen previous data.) Pain Descriptors / Indicators: Aching (Simultaneous filing. User may not have seen previous data.) Pain Intervention(s): Monitored during session;Premedicated before session;Ice applied (Simultaneous filing. User may not have seen previous data.)    Home Living                      Prior Function            PT Goals (current goals can now be found in the care plan section) Acute Rehab PT Goals Patient Stated Goal: go to rehab PT Goal Formulation: With patient Time For Goal Achievement: 11/01/16 Potential to Achieve Goals: Good Progress towards PT goals: Progressing toward goals    Frequency    7X/week      PT Plan Current plan remains appropriate    Co-evaluation  End of Session   Activity Tolerance: Patient tolerated treatment well Patient left: in bed;with call bell/phone within reach;with bed alarm set   PT Visit Diagnosis: Other abnormalities of gait and mobility (R26.89)     Time: 1005-1019 PT Time Calculation (min) (ACUTE ONLY): 14 min  Charges:  $Gait Training: 8-22 mins                    G Codes:       Xayvier Vallez 2016-11-23, 10:35 AM

## 2016-10-26 NOTE — Progress Notes (Signed)
   Subjective: 2 Days Post-Op Procedure(s) (LRB): LEFT TOTAL HIP ARTHROPLASTY ANTERIOR APPROACH (Left) Patient reports pain as mild and moderate.   Patient seen in rounds by Dr. Wynelle Link. Patient is well, but has had some minor complaints of pain in the hip, requiring pain medications Patient is ready to go tot he SNF  Objective: Vital signs in last 24 hours: Temp:  [97.9 F (36.6 C)-99.2 F (37.3 C)] 98.1 F (36.7 C) (03/14 0445) Pulse Rate:  [84-99] 84 (03/14 0445) Resp:  [15-18] 16 (03/14 0445) BP: (104-132)/(63-73) 104/63 (03/14 0445) SpO2:  [96 %-98 %] 96 % (03/14 0445)  Intake/Output from previous day:  Intake/Output Summary (Last 24 hours) at 10/26/16 0748 Last data filed at 10/26/16 0200  Gross per 24 hour  Intake             2400 ml  Output             3151 ml  Net             -751 ml    Intake/Output this shift: No intake/output data recorded.  Labs:  Recent Labs  10/25/16 0448 10/26/16 0435  HGB 9.5* 8.4*    Recent Labs  10/25/16 0448 10/26/16 0435  WBC 13.8* 12.5*  RBC 3.18* 2.86*  HCT 28.5* 25.1*  PLT 352 344    Recent Labs  10/25/16 0448 10/26/16 0435  NA 138 142  K 3.7 3.7  CL 106 107  CO2 24 27  BUN 17 17  CREATININE 0.83 0.74  GLUCOSE 202* 117*  CALCIUM 9.2 9.3   No results for input(s): LABPT, INR in the last 72 hours.  EXAM: General - Patient is Alert, Appropriate and Oriented Extremity - Neurovascular intact Sensation intact distally Intact pulses distally Dorsiflexion/Plantar flexion intact Incision - clean, dry, no drainage Motor Function - intact, moving foot and toes well on exam.   Assessment/Plan: 2 Days Post-Op Procedure(s) (LRB): LEFT TOTAL HIP ARTHROPLASTY ANTERIOR APPROACH (Left) Procedure(s) (LRB): LEFT TOTAL HIP ARTHROPLASTY ANTERIOR APPROACH (Left) Past Medical History:  Diagnosis Date  . Anemia   . Arthritis   . Chronic pain   . Depression   . Diabetes mellitus without complication ([place]) 1941   Type II - diet controlled  . Family history of adverse reaction to anesthesia    mother - nausea  . History of anemia   . Hyperlipidemia 2011   Rash with statin:  not well controlled with Gemfibrozil  . Hypertension 2011  . Insomnia secondary to chronic pain   . Low back pain with left-sided sciatica    Reportedly with lumbar spinal stenosis   Principal Problem:   Avascular necrosis of femoral head, left (HCC) Active Problems:   OA (osteoarthritis) of hip  Estimated body mass index is 20.6 kg/m as calculated from the following:   Height as of this encounter: 5\' 4"  (1.626 m).   Weight as of this encounter: 54.4 kg (120 lb). Up with therapy Discharge to SNF Diet - Cardiac diet and Diabetic diet Follow up - in 2 weeks Activity - WBAT Disposition - Skilled nursing facility Condition Upon Discharge - Stable D/C Meds - See DC Summary DVT Prophylaxis - Xarelto  Arlee Muslim, PA-C Orthopaedic Surgery 10/26/2016, 7:48 AM

## 2016-10-26 NOTE — Progress Notes (Signed)
Attempted to call report to [facility]. Message left.

## 2016-10-26 NOTE — Progress Notes (Signed)
Occupational Therapy Treatment Patient Details Name: GENEAL HUEBERT MRN: [id number] DOB: 1960/08/26 Today's Date: 10/26/2016    History of present illness s/p L DA THA   OT comments  Pt limited by fatique this session. She was able to use AE with instruction and plans d/c to SNF later today  Follow Up Recommendations  SNF    Equipment Recommendations  3 in 1 bedside commode;Tub/shower bench    Recommendations for Other Services      Precautions / Restrictions Precautions Precautions: Fall Restrictions Weight Bearing Restrictions: No Other Position/Activity Restrictions: WBAT       Mobility Bed Mobility         Supine to sit: Supervision Sit to supine: Supervision   General bed mobility comments: extra time; use of bed rails  Transfers                      Balance                                   ADL                       Lower Body Dressing: Minimal assistance;Sit to/from stand                 General ADL Comments: practiced with AE that was issued yesterday. Pt slept poorly and requested to lie back down after this.      Vision                     Perception     Praxis      Cognition   Behavior During Therapy: WFL for tasks assessed/performed Overall Cognitive Status: Within Functional Limits for tasks assessed                         Exercises     Shoulder Instructions       General Comments      Pertinent Vitals/ Pain       Pain Score: 7  Pain Location: L knee and hip Pain Descriptors / Indicators: Aching Pain Intervention(s): Limited activity within patient's tolerance;Monitored during session;Premedicated before session;Repositioned;Ice applied  Home Living                                          Prior Functioning/Environment              Frequency           Progress Toward Goals  OT Goals(current goals can now be found in the care plan  section)  Progress towards OT goals: Progressing toward goals     Plan      Co-evaluation                 End of Session    OT Visit Diagnosis: Pain Pain - Right/Left: Left Pain - part of body: Hip;Knee   Activity Tolerance Patient limited by fatigue   Patient Left in bed   Nurse Communication          Time: 3151-7616 OT Time Calculation (min): 19 min  Charges: OT General Charges $OT Visit: 1 Procedure OT Treatments $Self Care/Home Management : 8-22 mins  Lesle Chris, OTR/L [phone] 10/26/2016   Javionna Leder 10/26/2016,  10:34 AM

## 2016-11-10 ENCOUNTER — Ambulatory Visit: Payer: Self-pay | Admitting: Orthopedic Surgery

## 2016-11-25 ENCOUNTER — Other Ambulatory Visit: Payer: Self-pay | Admitting: Internal Medicine

## 2016-12-16 ENCOUNTER — Encounter: Payer: Self-pay | Admitting: Internal Medicine

## 2016-12-16 ENCOUNTER — Ambulatory Visit (INDEPENDENT_AMBULATORY_CARE_PROVIDER_SITE_OTHER): Payer: Medicaid Other | Admitting: Internal Medicine

## 2016-12-16 VITALS — BP 126/88 | HR 74 | Resp 12 | Ht 62.0 in | Wt 125.0 lb

## 2016-12-16 DIAGNOSIS — R8290 Unspecified abnormal findings in urine: Secondary | ICD-10-CM

## 2016-12-16 DIAGNOSIS — F17200 Nicotine dependence, unspecified, uncomplicated: Secondary | ICD-10-CM

## 2016-12-16 DIAGNOSIS — I10 Essential (primary) hypertension: Secondary | ICD-10-CM

## 2016-12-16 DIAGNOSIS — E785 Hyperlipidemia, unspecified: Secondary | ICD-10-CM | POA: Diagnosis not present

## 2016-12-16 DIAGNOSIS — D649 Anemia, unspecified: Secondary | ICD-10-CM

## 2016-12-16 DIAGNOSIS — M87052 Idiopathic aseptic necrosis of left femur: Secondary | ICD-10-CM

## 2016-12-16 DIAGNOSIS — E119 Type 2 diabetes mellitus without complications: Secondary | ICD-10-CM | POA: Diagnosis not present

## 2016-12-16 DIAGNOSIS — N898 Other specified noninflammatory disorders of vagina: Secondary | ICD-10-CM | POA: Diagnosis not present

## 2016-12-16 DIAGNOSIS — D126 Benign neoplasm of colon, unspecified: Secondary | ICD-10-CM

## 2016-12-16 LAB — POCT WET PREP WITH KOH
KOH PREP POC: NEGATIVE
RBC Wet Prep HPF POC: NEGATIVE
Trichomonas, UA: NEGATIVE
YEAST WET PREP PER HPF POC: NEGATIVE

## 2016-12-16 LAB — POCT URINALYSIS DIPSTICK
Bilirubin, UA: NEGATIVE
Glucose, UA: NEGATIVE
KETONES UA: NEGATIVE
Nitrite, UA: POSITIVE
PH UA: 5 (ref 5.0–8.0)
Protein, UA: 15
SPEC GRAV UA: 1.02 (ref 1.010–1.025)
Urobilinogen, UA: 0.2 E.U./dL

## 2016-12-16 LAB — GLUCOSE, POCT (MANUAL RESULT ENTRY): POC Glucose: 159 mg/dl — AB (ref 70–99)

## 2016-12-16 NOTE — Progress Notes (Signed)
Subjective:    Patient ID: Roberta Clark, female    DOB: 01-12-1961, 56 y.o.   MRN: [id number]  HPI   1.  Left hip avascular necrosis:  Had hip replacement 10/24/2016.  Her pain there is essentially resolved, but now with pain in right hip and apparently have found the same process there with plans to replace the hip in June. Will be receiving injections to the hip to control pain until then. Was receiving PT until yesterday.  Home Health to provide last evaluation today.   2.  DM:  Not checking sugars, but A1C was at 6.2% 10/11/16.   Had eye check 04/05/2016 with Dr. Parke Poisson.  No diabetic changes Checking feet nightly, no concerns. Up to date on flu and Pneumococcal vaccines.   3.  Hyperlipidemia:  Has not applied for scholarship for [employer].  Would be willing to try to do walking laps in 3 feet.  Does not swim.  Allergic to only Lipitor in past with rash.  Is taking Gemfibrozil regularly.  Also taking Fish Oil 1000 mg twice daily.  Does feel she is being more active with PT since hip replacement.  Feels she is eating well.  4.  Essential Hypertension:  Controlled.    5.  History of colonic adenomatous polyp with high grade dysplasia from 1st colonoscopy at [facility] 05/13/13.  Due for repeat colonoscopy.  She has the surgery above planned for mid July and also does not have transportation.  6.  Urine with odor for 3 weeks.  No dysuria.  Also with vaginal discharge--describes fishy odor.  Current Meds  Medication Sig  . acetaminophen (TYLENOL) 325 MG tablet Take 2 tablets (650 mg total) by mouth every 6 (six) hours as needed for mild pain (or Fever >/= 101).  Marland Kitchen amitriptyline (ELAVIL) 25 MG tablet TAKE ONE TABLET BY MOUTH AT BEDTIME AS NEEDED FOR SLEEP (Patient taking differently: TAKE ONE TABLET BY MOUTH AT BEDTIME)  . cyclobenzaprine (FLEXERIL) 10 MG tablet Take 10 mg by mouth 2 (two) times daily.  Marland Kitchen FLUoxetine (PROZAC) 20 MG capsule TAKE ONE CAPSULE BY MOUTH ONCE DAILY  . gabapentin  (NEURONTIN) 300 MG capsule Take 1 capsule (300 mg total) by mouth 3 (three) times daily. Take two capsules in the AM, two at lunch, and 4 at bedtime. (Patient taking differently: Take 600-1,200 mg by mouth 3 (three) times daily. Take two capsules in the AM, two at lunch, and 4 at bedtime.)  . gemfibrozil (LOPID) 600 MG tablet 1 tab by mouth twice daily with meals  . iron polysaccharides (NIFEREX) 150 MG capsule Take 1 capsule (150 mg total) by mouth daily.  Marland Kitchen lisinopril-hydrochlorothiazide (PRINZIDE,ZESTORETIC) 20-12.5 MG tablet TAKE ONE TABLET BY MOUTH ONCE DAILY  . Naproxen Sod-Diphenhydramine (ALEVE PM PO) Take by mouth. 2 at bedtime  . oxyCODONE (OXY IR/ROXICODONE) 5 MG immediate release tablet Take 1-3 tablets (5-15 mg total) by mouth every 4 (four) hours as needed for moderate pain or severe pain.  . potassium chloride (K-DUR,KLOR-CON) 10 MEQ tablet 1 tab by mouth daily (Patient taking differently: Take 10 mEq by mouth daily. )  . tiZANidine (ZANAFLEX) 4 MG capsule Take 4 mg by mouth every 8 (eight) hours as needed.   . [DISCONTINUED] ibuprofen (ADVIL,MOTRIN) 800 MG tablet Take 800 mg by mouth every 8 (eight) hours as needed.    Allergies  Allergen Reactions  . Metformin And Related Nausea And Vomiting  . Lipitor [Atorvastatin] Rash  Review of Systems     Objective:   Physical Exam  HEENT:  PERRL, EOMI, TMs pearly gray, throat without injection Neck:  Supple, No adenopathy Chest:  CTA CV:  RRR without murmur or rub, radial pulses normal and equal Abd: S, NT, No HSM or mass, + BS MS:  Smooth gait with left leg swing.   Diabetic Foot Exam - Simple   Simple Foot Form Visual Inspection No deformities, no ulcerations, no other skin breakdown bilaterally:  Yes See comments:  Yes Sensation Testing Intact to touch and monofilament testing bilaterally:  Yes Pulse Check Posterior Tibialis and Dorsalis pulse intact bilaterally:  Yes Comments Thickened discolored  toenails--diffuse       Assessment & Plan:  1.  AVN left hip, much improved with hip replacement.  Right hip up in July.  2.  DM:  Well controlled.  Urine microalbumin/crea  3.  Essential Hypertension:  Controlled.  6.  BV:  Wet prep + for BV.  Await urine culture to see if other abx needed before prescribing Metronidazole.  7.  Urine odor and abnormal UA:  Await culture result.  8.  Hyperlipidemia:  FLP.  May need to consider another statin to try.  Will wait to consider this until done with surgery recover.  To work on getting into [employer] for [facility].  9.  Adenomatous colonic polyp 2014:  Needs repeat colonoscopy.  Referral to Dr. Paulita Fujita locally.  To consider waiting until done with surgery recovery.  10.  Tobacco abuse:  Encouraged using nicotine gum to quit.

## 2016-12-17 LAB — CBC WITH DIFFERENTIAL/PLATELET
Basophils Absolute: 0 10*3/uL (ref 0.0–0.2)
Basos: 1 %
EOS (ABSOLUTE): 0.4 10*3/uL (ref 0.0–0.4)
EOS: 5 %
HEMATOCRIT: 38.4 % (ref 34.0–46.6)
Hemoglobin: 12.1 g/dL (ref 11.1–15.9)
Immature Grans (Abs): 0 10*3/uL (ref 0.0–0.1)
Immature Granulocytes: 0 %
LYMPHS ABS: 3.7 10*3/uL — AB (ref 0.7–3.1)
Lymphs: 53 %
MCH: 27.8 pg (ref 26.6–33.0)
MCHC: 31.5 g/dL (ref 31.5–35.7)
MCV: 88 fL (ref 79–97)
MONOS ABS: 0.4 10*3/uL (ref 0.1–0.9)
Monocytes: 6 %
Neutrophils Absolute: 2.4 10*3/uL (ref 1.4–7.0)
Neutrophils: 35 %
Platelets: 435 10*3/uL — ABNORMAL HIGH (ref 150–379)
RBC: 4.35 x10E6/uL (ref 3.77–5.28)
RDW: 13.8 % (ref 12.3–15.4)
WBC: 7 10*3/uL (ref 3.4–10.8)

## 2016-12-17 LAB — LIPID PANEL W/O CHOL/HDL RATIO
Cholesterol, Total: 267 mg/dL — ABNORMAL HIGH (ref 100–199)
HDL: 40 mg/dL (ref >39)
LDL CALC: 191 mg/dL — AB (ref 0–99)
Triglycerides: 179 mg/dL — ABNORMAL HIGH (ref 0–149)
VLDL Cholesterol Cal: 36 mg/dL (ref 5–40)

## 2016-12-17 LAB — MICROALBUMIN / CREATININE URINE RATIO
CREATININE, UR: 87 mg/dL
MICROALB/CREAT RATIO: 10.1 mg/g{creat} (ref 0.0–30.0)
MICROALBUM., U, RANDOM: 8.8 ug/mL

## 2016-12-20 ENCOUNTER — Telehealth: Payer: Self-pay

## 2016-12-20 NOTE — Telephone Encounter (Signed)
Called and spoke with patient. States she is still having strong odor and no other symptoms. States she was going to get suppositories to help with odor. In formed per Dr. Amil Amen do not get suppositories being they can make things worse. Patient was informed we are waiting for final report on urine culture seeing how the preliminary report suggests a UTI. Will call patient with update when final results come in and will call in antibiotic. Patient understood.

## 2016-12-21 LAB — URINE CULTURE

## 2016-12-26 ENCOUNTER — Ambulatory Visit: Payer: Self-pay | Admitting: Internal Medicine

## 2017-01-01 MED ORDER — CIPROFLOXACIN HCL 500 MG PO TABS: 500.0000 mg | ORAL_TABLET | Freq: Two times a day (BID) | ORAL | 0 refills | Status: DC

## 2017-01-01 NOTE — Addendum Note (Signed)
Addended by: Marcelino Duster on: 01/01/2017 10:59 PM   Modules accepted: Orders

## 2017-01-24 ENCOUNTER — Other Ambulatory Visit: Payer: Self-pay | Admitting: Internal Medicine

## 2017-01-24 DIAGNOSIS — E785 Hyperlipidemia, unspecified: Secondary | ICD-10-CM

## 2017-01-31 ENCOUNTER — Other Ambulatory Visit (INDEPENDENT_AMBULATORY_CARE_PROVIDER_SITE_OTHER): Payer: Medicaid Other

## 2017-01-31 DIAGNOSIS — R8290 Unspecified abnormal findings in urine: Secondary | ICD-10-CM

## 2017-01-31 LAB — POCT URINALYSIS DIPSTICK
Bilirubin, UA: NEGATIVE
Blood, UA: NEGATIVE
GLUCOSE UA: NEGATIVE
Ketones, UA: NEGATIVE
LEUKOCYTES UA: NEGATIVE
NITRITE UA: NEGATIVE
Protein, UA: NEGATIVE
Spec Grav, UA: 1.01 (ref 1.010–1.025)
Urobilinogen, UA: 0.2 E.U./dL
pH, UA: 6 (ref 5.0–8.0)

## 2017-02-13 ENCOUNTER — Ambulatory Visit: Payer: Self-pay | Admitting: Orthopedic Surgery

## 2017-02-20 NOTE — Patient Instructions (Signed)
Roberta Clark  02/20/2017   Your procedure is scheduled on: 02-22-17  Report to [facility] Main  Entrance Report to Admitting at 9:30 AM   Call this number if you have problems the morning of surgery [phone]   Remember: ONLY 1 PERSON MAY GO WITH YOU TO SHORT STAY TO GET  READY MORNING OF YOUR SURGERY.  Do not eat food or drink liquids :After Midnight.     Take these medicines the morning of surgery with A SIP OF WATER: Fluoxetine (Prozac) and Gabapentin (Neurontin)                                You may not have any metal on your body including hair pins and              piercings  Do not wear jewelry, make-up, lotions, powders or perfumes, deodorant             Do not wear nail polish.  Do not shave  48 hours prior to surgery.               Do not bring valuables to the hospital. [place].  Contacts, dentures or bridgework may not be worn into surgery.  Leave suitcase in the car. After surgery it may be brought to your room.                 Please read over the following fact sheets you were given: _____________________________________________________________________             [facility] - Preparing for Surgery Before surgery, you can play an important role.  Because skin is not sterile, your skin needs to be as free of germs as possible.  You can reduce the number of germs on your skin by washing with CHG (chlorahexidine gluconate) soap before surgery.  CHG is an antiseptic cleaner which kills germs and bonds with the skin to continue killing germs even after washing. Please DO NOT use if you have an allergy to CHG or antibacterial soaps.  If your skin becomes reddened/irritated stop using the CHG and inform your nurse when you arrive at Short Stay. Do not shave (including legs and underarms) for at least 48 hours prior to the first CHG shower.  You may shave your face/neck. Please follow these  instructions carefully:  1.  Shower with CHG Soap the night before surgery and the  morning of Surgery.  2.  If you choose to wash your hair, wash your hair first as usual with your  normal  shampoo.  3.  After you shampoo, rinse your hair and body thoroughly to remove the  shampoo.                           4.  Use CHG as you would any other liquid soap.  You can apply chg directly  to the skin and wash                       Gently with a scrungie or clean washcloth.  5.  Apply the CHG Soap to your body ONLY FROM THE NECK DOWN.   Do  not use on face/ open                           Wound or open sores. Avoid contact with eyes, ears mouth and genitals (private parts).                       Wash face,  Genitals (private parts) with your normal soap.             6.  Wash thoroughly, paying special attention to the area where your surgery  will be performed.  7.  Thoroughly rinse your body with warm water from the neck down.  8.  DO NOT shower/wash with your normal soap after using and rinsing off  the CHG Soap.                9.  Pat yourself dry with a clean towel.            10.  Wear clean pajamas.            11.  Place clean sheets on your bed the night of your first shower and do not  sleep with pets. Day of Surgery : Do not apply any lotions/deodorants the morning of surgery.  Please wear clean clothes to the hospital/surgery center.  FAILURE TO FOLLOW THESE INSTRUCTIONS MAY RESULT IN THE CANCELLATION OF YOUR SURGERY PATIENT SIGNATURE_________________________________  NURSE SIGNATURE__________________________________  ________________________________________________________________________   Adam Phenix  An incentive spirometer is a tool that can help keep your lungs clear and active. This tool measures how well you are filling your lungs with each breath. Taking long deep breaths may help reverse or decrease the chance of developing breathing (pulmonary) problems (especially  infection) following:  A long period of time when you are unable to move or be active. BEFORE THE PROCEDURE   If the spirometer includes an indicator to show your best effort, your nurse or respiratory therapist will set it to a desired goal.  If possible, sit up straight or lean slightly forward. Try not to slouch.  Hold the incentive spirometer in an upright position. INSTRUCTIONS FOR USE  1. Sit on the edge of your bed if possible, or sit up as far as you can in bed or on a chair. 2. Hold the incentive spirometer in an upright position. 3. Breathe out normally. 4. Place the mouthpiece in your mouth and seal your lips tightly around it. 5. Breathe in slowly and as deeply as possible, raising the piston or the ball toward the top of the column. 6. Hold your breath for 3-5 seconds or for as long as possible. Allow the piston or ball to fall to the bottom of the column. 7. Remove the mouthpiece from your mouth and breathe out normally. 8. Rest for a few seconds and repeat Steps 1 through 7 at least 10 times every 1-2 hours when you are awake. Take your time and take a few normal breaths between deep breaths. 9. The spirometer may include an indicator to show your best effort. Use the indicator as a goal to work toward during each repetition. 10. After each set of 10 deep breaths, practice coughing to be sure your lungs are clear. If you have an incision (the cut made at the time of surgery), support your incision when coughing by placing a pillow or rolled up towels firmly against it. Once you are able to get out of bed,  walk around indoors and cough well. You may stop using the incentive spirometer when instructed by your caregiver.  RISKS AND COMPLICATIONS  Take your time so you do not get dizzy or light-headed.  If you are in pain, you may need to take or ask for pain medication before doing incentive spirometry. It is harder to take a deep breath if you are having pain. AFTER  USE  Rest and breathe slowly and easily.  It can be helpful to keep track of a log of your progress. Your caregiver can provide you with a simple table to help with this. If you are using the spirometer at home, follow these instructions: [place] IF:   You are having difficultly using the spirometer.  You have trouble using the spirometer as often as instructed.  Your pain medication is not giving enough relief while using the spirometer.  You develop fever of 100.5 F (38.1 C) or higher. SEEK IMMEDIATE MEDICAL CARE IF:   You cough up bloody sputum that had not been present before.  You develop fever of 102 F (38.9 C) or greater.  You develop worsening pain at or near the incision site. MAKE SURE YOU:   Understand these instructions.  Will watch your condition.  Will get help right away if you are not doing well or get worse. Document Released: 12/12/2006 Document Revised: 10/24/2011 Document Reviewed: 02/12/2007 ExitCare Patient Information 2014 ExitCare, [place].   ________________________________________________________________________  WHAT IS A BLOOD TRANSFUSION? Blood Transfusion Information  A transfusion is the replacement of blood or some of its parts. Blood is made up of multiple cells which provide different functions.  Red blood cells carry oxygen and are used for blood loss replacement.  White blood cells fight against infection.  Platelets control bleeding.  Plasma helps clot blood.  Other blood products are available for specialized needs, such as hemophilia or other clotting disorders. BEFORE THE TRANSFUSION  Who gives blood for transfusions?   Healthy volunteers who are fully evaluated to make sure their blood is safe. This is blood bank blood. Transfusion therapy is the safest it has ever been in the practice of medicine. Before blood is taken from a donor, a complete history is taken to make sure that person has no history of diseases  nor engages in risky social behavior (examples are intravenous drug use or sexual activity with multiple partners). The donor's travel history is screened to minimize risk of transmitting infections, such as malaria. The donated blood is tested for signs of infectious diseases, such as HIV and hepatitis. The blood is then tested to be sure it is compatible with you in order to minimize the chance of a transfusion reaction. If you or a relative donates blood, this is often done in anticipation of surgery and is not appropriate for emergency situations. It takes many days to process the donated blood. RISKS AND COMPLICATIONS Although transfusion therapy is very safe and saves many lives, the main dangers of transfusion include:   Getting an infectious disease.  Developing a transfusion reaction. This is an allergic reaction to something in the blood you were given. Every precaution is taken to prevent this. The decision to have a blood transfusion has been considered carefully by your caregiver before blood is given. Blood is not given unless the benefits outweigh the risks. AFTER THE TRANSFUSION  Right after receiving a blood transfusion, you will usually feel much better and more energetic. This is especially true if your red blood cells  have gotten low (anemic). The transfusion raises the level of the red blood cells which carry oxygen, and this usually causes an energy increase.  The nurse administering the transfusion will monitor you carefully for complications. HOME CARE INSTRUCTIONS  No special instructions are needed after a transfusion. You may find your energy is better. Speak with your caregiver about any limitations on activity for underlying diseases you may have. SEEK MEDICAL CARE IF:   Your condition is not improving after your transfusion.  You develop redness or irritation at the intravenous (IV) site. SEEK IMMEDIATE MEDICAL CARE IF:  Any of the following symptoms occur over the  next 12 hours:  Shaking chills.  You have a temperature by mouth above 102 F (38.9 C), not controlled by medicine.  Chest, back, or muscle pain.  People around you feel you are not acting correctly or are confused.  Shortness of breath or difficulty breathing.  Dizziness and fainting.  You get a rash or develop hives.  You have a decrease in urine output.  Your urine turns a dark color or changes to pink, red, or brown. Any of the following symptoms occur over the next 10 days:  You have a temperature by mouth above 102 F (38.9 C), not controlled by medicine.  Shortness of breath.  Weakness after normal activity.  The white part of the eye turns yellow (jaundice).  You have a decrease in the amount of urine or are urinating less often.  Your urine turns a dark color or changes to pink, red, or brown. Document Released: 07/29/2000 Document Revised: 10/24/2011 Document Reviewed: 03/17/2008 [facility] Patient Information 2014 [place], [place].  _______________________________________________________________________

## 2017-02-20 NOTE — Progress Notes (Addendum)
10-22-16 (EPIC) EKG 05-24-16 (EPIC) CXR  02-21-17 Pt also indicated at PAT appt that she had discontinued Aspirin and Tylenol PM as of 02-20-17.

## 2017-02-21 ENCOUNTER — Ambulatory Visit: Payer: Self-pay | Admitting: Orthopedic Surgery

## 2017-02-21 ENCOUNTER — Encounter (HOSPITAL_COMMUNITY)
Admission: RE | Admit: 2017-02-21 | Discharge: 2017-02-21 | Disposition: A | Discharge status: Home / Self Care | Payer: Medicaid Other | Source: Ambulatory Visit | Attending: Orthopedic Surgery | Admitting: Orthopedic Surgery

## 2017-02-21 ENCOUNTER — Encounter (HOSPITAL_COMMUNITY): Payer: Self-pay

## 2017-02-21 LAB — COMPREHENSIVE METABOLIC PANEL
ALBUMIN: 4.9 g/dL (ref 3.5–5.0)
ALK PHOS: 100 U/L (ref 38–126)
ALT: 16 U/L (ref 14–54)
ANION GAP: 10 (ref 5–15)
AST: 20 U/L (ref 15–41)
BUN: 25 mg/dL — ABNORMAL HIGH (ref 6–20)
CALCIUM: 10 mg/dL (ref 8.9–10.3)
CHLORIDE: 105 mmol/L (ref 101–111)
CO2: 24 mmol/L (ref 22–32)
Creatinine, Ser: 0.93 mg/dL (ref 0.44–1.00)
GFR calc Af Amer: >60 mL/min (ref >60)
GFR calc non Af Amer: >60 mL/min (ref >60)
GLUCOSE: 134 mg/dL — AB (ref 65–99)
Potassium: 4.1 mmol/L (ref 3.5–5.1)
SODIUM: 139 mmol/L (ref 135–145)
Total Bilirubin: 0.3 mg/dL (ref 0.3–1.2)
Total Protein: 8.7 g/dL — ABNORMAL HIGH (ref 6.5–8.1)

## 2017-02-21 LAB — CBC
HCT: 39.4 % (ref 36.0–46.0)
HEMOGLOBIN: 13.1 g/dL (ref 12.0–15.0)
MCH: 27.7 pg (ref 26.0–34.0)
MCHC: 33.2 g/dL (ref 30.0–36.0)
MCV: 83.3 fL (ref 78.0–100.0)
PLATELETS: 393 10*3/uL (ref 150–400)
RBC: 4.73 MIL/uL (ref 3.87–5.11)
RDW: 14.4 % (ref 11.5–15.5)
WBC: 7.5 10*3/uL (ref 4.0–10.5)

## 2017-02-21 LAB — APTT: APTT: 33 s (ref 24–36)

## 2017-02-21 LAB — SURGICAL PCR SCREEN
MRSA, PCR: NEGATIVE
Staphylococcus aureus: NEGATIVE

## 2017-02-21 LAB — PROTIME-INR
INR: 0.92
Prothrombin Time: 12.3 seconds (ref 11.4–15.2)

## 2017-02-21 NOTE — H&P (Signed)
Roberta Clark DOB: [birth year] Single / Language: Roberta Clark / Race: Black or African American Female Date of Admission:  02/22/17 CC:  Right hip pain History of Present Illness  The patient is a 56 year old female who comes in for a preoperative History and Physical. The patient is scheduled for a right total hip arthroplasty (anterior) to be performed by Dr. Dione Plover. [name], MD at [facility] on 03/01/2017. The patient is a 56 year old female who was seen in referral from Roberta Liter PA-C.The patient reported bilateral hip pain, initially the left hip was worse than right. She has had pain that have been present for over 7 year(s). Symptoms reported include hip pain, weakness, night pain, stiffness, difficulty flexing hip and difficulty ambulating The patient reports symptoms radiating to the: right groin, left groin and left thigh, lateral and posterior. The patient feels as if their symptoms are does feel they are worsening. Symptoms are exacerbated by weight bearing and lying on the affected side. Prior to being seen, the patient was previously evaluated by a primary physician. Previous workup for this problem has included hip x-rays (available on the [facility] system). Previous treatment for this problem has included opioid analgesics. She has had problems with her left worse than right hip. She recently underwent surgery fot he left hip and has been doing well following that surgery. The right hip continues to be a problem though. It is limiting what she can and cannot do. She has been diagnosed with avascular necrosis of the hip. She has severe pain. The hip hurts her at night also. She is now ready to get the other hip fixed at this time. They have been treated conservatively in the past for the above stated problem and despite conservative measures, they continue to have progressive pain and severe functional limitations and dysfunction. They have failed non-operative management including  home exercise, medications. It is felt that they would benefit from undergoing total joint replacement. Risks and benefits of the procedure have been discussed with the patient and they elect to proceed with surgery. There are no active contraindications to surgery such as ongoing infection or rapidly progressive neurological disease.   Problem List/Past Medical  Avascular necrosis of hip, right Anemia  Diabetes Mellitus, Type II  High blood pressure  Hypercholesterolemia  Hemorrhoids  Menopause    Allergies Lipitor *ANTIHYPERLIPIDEMICS*  Rash. MetFORMIN HCl *ANTIDIABETICS*  Nausea.  Family History  Diabetes Mellitus  mother and brother Heart Disease  mother Hypertension  mother, sister and brother  Social History  Alcohol use  current drinker; drinks beer and hard liquor; only occasionally per week Children  2 Current work status  unemployed Drug/Alcohol Rehab (Currently)  no Drug/Alcohol Rehab (Previously)  no Exercise  Exercises rarely; does running / walking Illicit drug use  no Living situation  live alone Marital status  single Number of flights of stairs before winded  less than 1 Tobacco / smoke exposure  no Tobacco use  current some days smoker; smoke(d) less than 1/2 pack(s) per day  Medication History OxyCODONE HCl (5MG  Capsule, Oral) Active. TraMADol HCl (50MG  Tablet, Oral) Active. Carisoprodol (350MG  Tablet, Oral) Active. Hydrocodone-Acetaminophen (5-325MG  Tablet, Oral) Active. TiZANidine HCl (4MG  Capsule, Oral) Active. Cyclobenzaprine HCl (10MG  Tablet, Oral) Active. Aleve PM (220-25MG  Tablet, Oral) Active. Amitriptyline HCl (25MG  Tablet, Oral) Active. Centrum Silver (Oral) Active. Fish Oil (1000MG  Capsule, Oral) Active. FLUoxetine HCl (20MG  Capsule, Oral) Active. Gabapentin (300MG  Capsule, Oral) Active. Gemfibrozil (600MG  Tablet, Oral) Active. Ibuprofen (800MG   Tablet, Oral) Active. Lisinopril-Hydrochlorothiazide  (20-12.5MG  Tablet, Oral) Active. Potassium Chloride (10MEQ Capsule ER, Oral) Active.   Past Surgical History  Spinal Surgery  Tubal Ligation    Review of Systems  General Present- Appetite Loss, Feeling sick, Night Sweats, Weight Gain and Weight Loss. Not Present- Chills, Fatigue, Fever and Memory Loss. Skin Not Present- Eczema, Hives, Itching, Lesions and Rash. HEENT Present- Blurred Vision. Not Present- Dentures, Double Vision, Headache, Hearing Loss, Tinnitus and Visual Loss. Respiratory Not Present- Allergies, Chronic Cough, Coughing up blood, Shortness of breath at rest and Shortness of breath with exertion. Cardiovascular Not Present- Chest Pain, Difficulty Breathing Lying Down, Murmur, Palpitations, Racing/skipping heartbeats and Swelling. Gastrointestinal Present- Loss of appetitie and Nausea. Not Present- Abdominal Pain, Bloody Stool, Constipation, Diarrhea, Difficulty Swallowing, Heartburn, Jaundice and Vomiting. Female Genitourinary Not Present- Blood in Urine, Discharge, Flank Pain, Incontinence, Painful Urination, Urgency, Urinary frequency, Urinary Retention, Urinating at Night and Weak urinary stream. Musculoskeletal Present- Back Pain and Spasms. Not Present- Joint Pain, Joint Swelling, Morning Stiffness, Muscle Pain and Muscle Weakness. Neurological Present- Burning. Not Present- Blackout spells, Difficulty with balance, Dizziness, Paralysis, Tremor and Weakness. Psychiatric Not Present- Insomnia. Endocrine Present- Appetite Changes and Hot flashes. Hematology Present- Anemia.  Vitals  Weight: 150 lb Height: 64in Weight was reported by patient. Height was reported by patient. Body Surface Area: 1.73 m Body Mass Index: 25.75 kg/m  Pulse: 76 (Regular)  BP: 134/72 (Sitting, Left Arm, Standard)   Physical Exam General Mental Status -Alert, cooperative and good historian. General Appearance-pleasant, Not in acute distress. Orientation-Oriented  X3. Build & Nutrition-Well nourished and Well developed.  Head and Neck Head-normocephalic, atraumatic . Neck Global Assessment - supple, no bruit auscultated on the right, no bruit auscultated on the left.  Eye Pupil - Bilateral-Regular and Round. Motion - Bilateral-EOMI.  Chest and Lung Exam Auscultation Breath sounds - clear at anterior chest wall and clear at posterior chest wall. Adventitious sounds - No Adventitious sounds.  Cardiovascular Auscultation Rhythm - Regular rate and rhythm. Heart Sounds - S1 WNL and S2 WNL. Murmurs & Other Heart Sounds - Auscultation of the heart reveals - No Murmurs.  Abdomen Palpation/Percussion Tenderness - Abdomen is non-tender to palpation. Rigidity (guarding) - Abdomen is soft. Auscultation Auscultation of the abdomen reveals - Bowel sounds normal.  Female Genitourinary Note: Not done, not pertinent to present illness   Musculoskeletal Note: Her left hip looks great. Negative flexion to 110, rotated in 20, out 30, abduct 30 without discomfort. Right hip flexion about 100, rotation in 20, out 30, abduct 30 with discomfort.  RADIOGRAPHS Her radiograph, AP pelvis, AP and lateral of the left hip showed her prosthesis on the left to be in excellent position with no periprosthetic abnormalities. On the right, she does have an area of subchondral sclerosis and a faint crescent sign.  Assessment & Plan Avascular necrosis of hip, right (M87.051) Aftercare following left hip joint replacement surgery (Z47.1, P10.258)  Note:Surgical Plans: Right Total Hip Replacement - Anterior Approach  Disposition: Home  PCP: Dr. Amil Amen  IV TXA  Anesthesia Issues: None  Patient was instructed on what medications to stop prior to surgery.  Signed electronically by Joelene Millin, III PA-C

## 2017-02-21 NOTE — Progress Notes (Signed)
02-21-17 CMP result,  routed to Dr. [name] for review 

## 2017-02-22 ENCOUNTER — Inpatient Hospital Stay (HOSPITAL_COMMUNITY): Payer: Medicaid Other

## 2017-02-22 ENCOUNTER — Inpatient Hospital Stay (HOSPITAL_COMMUNITY)
Admission: RE | Admit: 2017-02-22 | Discharge: 2017-02-24 | DRG: 470 | Disposition: A | Discharge status: Skilled Nursing Facility | Payer: Medicaid Other | Source: Ambulatory Visit | Attending: Orthopedic Surgery | Admitting: Orthopedic Surgery

## 2017-02-22 ENCOUNTER — Inpatient Hospital Stay (HOSPITAL_COMMUNITY): Payer: Medicaid Other | Admitting: Registered Nurse

## 2017-02-22 ENCOUNTER — Encounter (HOSPITAL_COMMUNITY): Payer: Self-pay

## 2017-02-22 ENCOUNTER — Encounter (HOSPITAL_COMMUNITY)
Admission: RE | Disposition: A | Discharge status: Skilled Nursing Facility | Payer: Self-pay | Source: Ambulatory Visit | Attending: Orthopedic Surgery

## 2017-02-22 DIAGNOSIS — Z96649 Presence of unspecified artificial hip joint: Secondary | ICD-10-CM

## 2017-02-22 DIAGNOSIS — Z79899 Other long term (current) drug therapy: Secondary | ICD-10-CM | POA: Diagnosis not present

## 2017-02-22 DIAGNOSIS — E7800 Pure hypercholesterolemia, unspecified: Secondary | ICD-10-CM | POA: Diagnosis present

## 2017-02-22 DIAGNOSIS — Z8249 Family history of ischemic heart disease and other diseases of the circulatory system: Secondary | ICD-10-CM

## 2017-02-22 DIAGNOSIS — D649 Anemia, unspecified: Secondary | ICD-10-CM | POA: Diagnosis present

## 2017-02-22 DIAGNOSIS — M5442 Lumbago with sciatica, left side: Secondary | ICD-10-CM | POA: Diagnosis not present

## 2017-02-22 DIAGNOSIS — I10 Essential (primary) hypertension: Secondary | ICD-10-CM | POA: Diagnosis present

## 2017-02-22 DIAGNOSIS — Z419 Encounter for procedure for purposes other than remedying health state, unspecified: Secondary | ICD-10-CM

## 2017-02-22 DIAGNOSIS — M1611 Unilateral primary osteoarthritis, right hip: Secondary | ICD-10-CM

## 2017-02-22 DIAGNOSIS — Z888 Allergy status to other drugs, medicaments and biological substances status: Secondary | ICD-10-CM

## 2017-02-22 DIAGNOSIS — M879 Osteonecrosis, unspecified: Secondary | ICD-10-CM | POA: Diagnosis not present

## 2017-02-22 DIAGNOSIS — M169 Osteoarthritis of hip, unspecified: Secondary | ICD-10-CM | POA: Diagnosis present

## 2017-02-22 DIAGNOSIS — E119 Type 2 diabetes mellitus without complications: Secondary | ICD-10-CM | POA: Diagnosis not present

## 2017-02-22 DIAGNOSIS — Z833 Family history of diabetes mellitus: Secondary | ICD-10-CM

## 2017-02-22 DIAGNOSIS — Z86010 Personal history of colonic polyps: Secondary | ICD-10-CM

## 2017-02-22 DIAGNOSIS — F329 Major depressive disorder, single episode, unspecified: Secondary | ICD-10-CM | POA: Diagnosis present

## 2017-02-22 DIAGNOSIS — E785 Hyperlipidemia, unspecified: Secondary | ICD-10-CM | POA: Diagnosis present

## 2017-02-22 DIAGNOSIS — G8929 Other chronic pain: Secondary | ICD-10-CM | POA: Diagnosis present

## 2017-02-22 DIAGNOSIS — M25551 Pain in right hip: Secondary | ICD-10-CM | POA: Diagnosis present

## 2017-02-22 DIAGNOSIS — K649 Unspecified hemorrhoids: Secondary | ICD-10-CM | POA: Diagnosis not present

## 2017-02-22 DIAGNOSIS — M87051 Idiopathic aseptic necrosis of right femur: Secondary | ICD-10-CM | POA: Diagnosis present

## 2017-02-22 DIAGNOSIS — F17210 Nicotine dependence, cigarettes, uncomplicated: Secondary | ICD-10-CM | POA: Diagnosis present

## 2017-02-22 HISTORY — PX: TOTAL HIP ARTHROPLASTY: SHX124

## 2017-02-22 LAB — CBC
HCT: 30.9 % — ABNORMAL LOW (ref 36.0–46.0)
Hemoglobin: 10.2 g/dL — ABNORMAL LOW (ref 12.0–15.0)
MCH: 28.3 pg (ref 26.0–34.0)
MCHC: 33 g/dL (ref 30.0–36.0)
MCV: 85.6 fL (ref 78.0–100.0)
Platelets: 366 10*3/uL (ref 150–400)
RBC: 3.61 MIL/uL — ABNORMAL LOW (ref 3.87–5.11)
RDW: 14.8 % (ref 11.5–15.5)
WBC: 16.6 10*3/uL — ABNORMAL HIGH (ref 4.0–10.5)

## 2017-02-22 LAB — COMPREHENSIVE METABOLIC PANEL
ALK PHOS: 75 U/L (ref 38–126)
ALT: 15 U/L (ref 14–54)
ANION GAP: 7 (ref 5–15)
AST: 36 U/L (ref 15–41)
Albumin: 3.8 g/dL (ref 3.5–5.0)
BILIRUBIN TOTAL: 1.2 mg/dL (ref 0.3–1.2)
BUN: 19 mg/dL (ref 6–20)
CALCIUM: 8.5 mg/dL — AB (ref 8.9–10.3)
CO2: 24 mmol/L (ref 22–32)
Chloride: 106 mmol/L (ref 101–111)
Creatinine, Ser: 0.95 mg/dL (ref 0.44–1.00)
GLUCOSE: 155 mg/dL — AB (ref 65–99)
Potassium: 5.3 mmol/L — ABNORMAL HIGH (ref 3.5–5.1)
Sodium: 137 mmol/L (ref 135–145)
TOTAL PROTEIN: 6.7 g/dL (ref 6.5–8.1)

## 2017-02-22 LAB — PROTIME-INR
INR: 1.09
Prothrombin Time: 14.1 seconds (ref 11.4–15.2)

## 2017-02-22 LAB — HEMOGLOBIN A1C
HEMOGLOBIN A1C: 7 % — AB (ref 4.8–5.6)
Mean Plasma Glucose: 154 mg/dL

## 2017-02-22 LAB — GLUCOSE, CAPILLARY
Glucose-Capillary: 119 mg/dL — ABNORMAL HIGH (ref 65–99)
Glucose-Capillary: 125 mg/dL — ABNORMAL HIGH (ref 65–99)

## 2017-02-22 LAB — TYPE AND SCREEN
ABO/RH(D): A POS
ANTIBODY SCREEN: NEGATIVE

## 2017-02-22 LAB — APTT: aPTT: 34 s (ref 24–36)

## 2017-02-22 SURGERY — ARTHROPLASTY, HIP, TOTAL, ANTERIOR APPROACH
Anesthesia: Spinal | Site: Hip | Laterality: Right

## 2017-02-22 MED ORDER — FENTANYL CITRATE (PF) 100 MCG/2ML IJ SOLN
INTRAMUSCULAR | Status: AC
  Filled 2017-02-22: qty 2

## 2017-02-22 MED ORDER — FENTANYL CITRATE (PF) 100 MCG/2ML IJ SOLN
INTRAMUSCULAR | Status: AC
  Administered 2017-02-22: 50 ug via INTRAVENOUS
  Filled 2017-02-22: qty 4

## 2017-02-22 MED ORDER — GABAPENTIN 300 MG PO CAPS
600.0000 mg | ORAL_CAPSULE | Freq: Two times a day (BID) | ORAL | Status: DC
  Administered 2017-02-22 – 2017-02-24 (×4): 600 mg via ORAL
  Filled 2017-02-22 (×4): qty 2

## 2017-02-22 MED ORDER — METHOCARBAMOL 1000 MG/10ML IJ SOLN
500.0000 mg | Freq: Four times a day (QID) | INTRAVENOUS | Status: DC | PRN
  Administered 2017-02-22: 500 mg via INTRAVENOUS
  Filled 2017-02-22: qty 550

## 2017-02-22 MED ORDER — MORPHINE SULFATE (PF) 4 MG/ML IV SOLN
1.0000 mg | INTRAVENOUS | Status: DC | PRN
  Administered 2017-02-22: 2 mg via INTRAVENOUS
  Administered 2017-02-22: 16:00:00 1 mg via INTRAVENOUS
  Administered 2017-02-23: 01:00:00 2 mg via INTRAVENOUS
  Filled 2017-02-22 (×3): qty 1

## 2017-02-22 MED ORDER — POTASSIUM CHLORIDE CRYS ER 10 MEQ PO TBCR
10.0000 meq | EXTENDED_RELEASE_TABLET | Freq: Every day | ORAL | Status: DC
  Administered 2017-02-23 – 2017-02-24 (×2): 10 meq via ORAL
  Filled 2017-02-22 (×2): qty 1

## 2017-02-22 MED ORDER — PROPOFOL 10 MG/ML IV BOLUS
INTRAVENOUS | Status: AC
  Filled 2017-02-22: qty 40

## 2017-02-22 MED ORDER — FENTANYL CITRATE (PF) 100 MCG/2ML IJ SOLN
25.0000 ug | INTRAMUSCULAR | Status: DC | PRN
  Administered 2017-02-22 (×3): 50 ug via INTRAVENOUS

## 2017-02-22 MED ORDER — BISACODYL 10 MG RE SUPP
10.0000 mg | Freq: Every day | RECTAL | Status: DC | PRN
  Administered 2017-02-24: 11:00:00 10 mg via RECTAL
  Filled 2017-02-22: qty 1

## 2017-02-22 MED ORDER — METOCLOPRAMIDE HCL 5 MG PO TABS: 5.0000 mg | ORAL_TABLET | Freq: Three times a day (TID) | ORAL | Status: DC | PRN

## 2017-02-22 MED ORDER — BUPIVACAINE HCL (PF) 0.25 % IJ SOLN
INTRAMUSCULAR | Status: DC | PRN
  Administered 2017-02-22: 30 mL

## 2017-02-22 MED ORDER — PHENOL 1.4 % MT LIQD
1.0000 | OROMUCOSAL | Status: DC | PRN
  Filled 2017-02-22: qty 177

## 2017-02-22 MED ORDER — LACTATED RINGERS IV SOLN: INTRAVENOUS | Status: DC

## 2017-02-22 MED ORDER — MIDAZOLAM HCL 2 MG/2ML IJ SOLN
INTRAMUSCULAR | Status: AC
  Filled 2017-02-22: qty 2

## 2017-02-22 MED ORDER — ACETAMINOPHEN 10 MG/ML IV SOLN
1000.0000 mg | Freq: Once | INTRAVENOUS | Status: AC
  Administered 2017-02-22: 1000 mg via INTRAVENOUS

## 2017-02-22 MED ORDER — BUPIVACAINE HCL (PF) 0.25 % IJ SOLN
INTRAMUSCULAR | Status: AC
  Filled 2017-02-22: qty 30

## 2017-02-22 MED ORDER — CHLORHEXIDINE GLUCONATE 4 % EX LIQD: 60.0000 mL | Freq: Once | CUTANEOUS | Status: DC

## 2017-02-22 MED ORDER — ACETAMINOPHEN 325 MG PO TABS
650.0000 mg | ORAL_TABLET | Freq: Four times a day (QID) | ORAL | Status: DC | PRN
  Administered 2017-02-24: 09:00:00 650 mg via ORAL
  Filled 2017-02-22: qty 2

## 2017-02-22 MED ORDER — FLEET ENEMA 7-19 GM/118ML RE ENEM: 1.0000 | ENEMA | Freq: Once | RECTAL | Status: DC | PRN

## 2017-02-22 MED ORDER — DIPHENHYDRAMINE HCL 12.5 MG/5ML PO ELIX: 12.5000 mg | ORAL_SOLUTION | ORAL | Status: DC | PRN

## 2017-02-22 MED ORDER — PHENYLEPHRINE 40 MCG/ML (10ML) SYRINGE FOR IV PUSH (FOR BLOOD PRESSURE SUPPORT)
PREFILLED_SYRINGE | INTRAVENOUS | Status: DC | PRN
  Administered 2017-02-22 (×3): 80 ug via INTRAVENOUS

## 2017-02-22 MED ORDER — ONDANSETRON HCL 4 MG/2ML IJ SOLN: 4.0000 mg | Freq: Four times a day (QID) | INTRAMUSCULAR | Status: DC | PRN

## 2017-02-22 MED ORDER — TRANEXAMIC ACID 1000 MG/10ML IV SOLN
1000.0000 mg | INTRAVENOUS | Status: AC
  Administered 2017-02-22: 1000 mg via INTRAVENOUS
  Filled 2017-02-22: qty 1100

## 2017-02-22 MED ORDER — DEXAMETHASONE SODIUM PHOSPHATE 10 MG/ML IJ SOLN
10.0000 mg | Freq: Once | INTRAMUSCULAR | Status: AC
  Administered 2017-02-22: 10 mg via INTRAVENOUS

## 2017-02-22 MED ORDER — AMITRIPTYLINE HCL 25 MG PO TABS
25.0000 mg | ORAL_TABLET | Freq: Every day | ORAL | Status: DC
  Administered 2017-02-22 – 2017-02-23 (×2): 25 mg via ORAL
  Filled 2017-02-22 (×2): qty 1

## 2017-02-22 MED ORDER — DEXAMETHASONE SODIUM PHOSPHATE 10 MG/ML IJ SOLN
10.0000 mg | Freq: Once | INTRAMUSCULAR | Status: AC
  Administered 2017-02-23: 10 mg via INTRAVENOUS
  Filled 2017-02-22: qty 1

## 2017-02-22 MED ORDER — SODIUM CHLORIDE 0.9 % IV SOLN
INTRAVENOUS | Status: DC
  Administered 2017-02-22 – 2017-02-23 (×2): via INTRAVENOUS

## 2017-02-22 MED ORDER — CEFAZOLIN SODIUM-DEXTROSE 2-4 GM/100ML-% IV SOLN
INTRAVENOUS | Status: AC
  Filled 2017-02-22: qty 100

## 2017-02-22 MED ORDER — 0.9 % SODIUM CHLORIDE (POUR BTL) OPTIME
TOPICAL | Status: DC | PRN
  Administered 2017-02-22: 250 mL

## 2017-02-22 MED ORDER — RIVAROXABAN 10 MG PO TABS
10.0000 mg | ORAL_TABLET | Freq: Every day | ORAL | Status: DC
  Administered 2017-02-23 – 2017-02-24 (×2): 10 mg via ORAL
  Filled 2017-02-22 (×2): qty 1

## 2017-02-22 MED ORDER — CEFAZOLIN SODIUM-DEXTROSE 2-4 GM/100ML-% IV SOLN
2.0000 g | INTRAVENOUS | Status: AC
  Administered 2017-02-22: 2 g via INTRAVENOUS

## 2017-02-22 MED ORDER — BUPIVACAINE IN DEXTROSE 0.75-8.25 % IT SOLN
INTRATHECAL | Status: DC | PRN
  Administered 2017-02-22: 1.8 mL via INTRATHECAL

## 2017-02-22 MED ORDER — TRANEXAMIC ACID 1000 MG/10ML IV SOLN
1000.0000 mg | Freq: Once | INTRAVENOUS | Status: AC
  Administered 2017-02-22: 17:00:00 1000 mg via INTRAVENOUS
  Filled 2017-02-22: qty 1100

## 2017-02-22 MED ORDER — POLYETHYLENE GLYCOL 3350 17 G PO PACK
17.0000 g | PACK | Freq: Every day | ORAL | Status: DC | PRN
  Administered 2017-02-24: 17 g via ORAL
  Filled 2017-02-22: qty 1

## 2017-02-22 MED ORDER — PROPOFOL 500 MG/50ML IV EMUL
INTRAVENOUS | Status: DC | PRN
  Administered 2017-02-22: 40 ug/kg/min via INTRAVENOUS

## 2017-02-22 MED ORDER — FLUOXETINE HCL 20 MG PO CAPS
20.0000 mg | ORAL_CAPSULE | Freq: Every day | ORAL | Status: DC
  Administered 2017-02-23 – 2017-02-24 (×2): 20 mg via ORAL
  Filled 2017-02-22 (×2): qty 1

## 2017-02-22 MED ORDER — LACTATED RINGERS IV SOLN
INTRAVENOUS | Status: DC
  Administered 2017-02-22: 1000 mL via INTRAVENOUS

## 2017-02-22 MED ORDER — ONDANSETRON HCL 4 MG/2ML IJ SOLN
INTRAMUSCULAR | Status: DC | PRN
  Administered 2017-02-22: 4 mg via INTRAVENOUS

## 2017-02-22 MED ORDER — ACETAMINOPHEN 650 MG RE SUPP: 650.0000 mg | Freq: Four times a day (QID) | RECTAL | Status: DC | PRN

## 2017-02-22 MED ORDER — ONDANSETRON HCL 4 MG PO TABS: 4.0000 mg | ORAL_TABLET | Freq: Four times a day (QID) | ORAL | Status: DC | PRN

## 2017-02-22 MED ORDER — GEMFIBROZIL 600 MG PO TABS
600.0000 mg | ORAL_TABLET | Freq: Two times a day (BID) | ORAL | Status: DC
  Administered 2017-02-22 – 2017-02-24 (×4): 600 mg via ORAL
  Filled 2017-02-22 (×5): qty 1

## 2017-02-22 MED ORDER — MENTHOL 3 MG MT LOZG: 1.0000 | LOZENGE | OROMUCOSAL | Status: DC | PRN

## 2017-02-22 MED ORDER — DOCUSATE SODIUM 100 MG PO CAPS
100.0000 mg | ORAL_CAPSULE | Freq: Two times a day (BID) | ORAL | Status: DC
  Administered 2017-02-22 – 2017-02-24 (×4): 100 mg via ORAL
  Filled 2017-02-22 (×4): qty 1

## 2017-02-22 MED ORDER — ONDANSETRON HCL 4 MG/2ML IJ SOLN: 4.0000 mg | Freq: Once | INTRAMUSCULAR | Status: DC | PRN

## 2017-02-22 MED ORDER — OXYCODONE HCL 5 MG PO TABS
5.0000 mg | ORAL_TABLET | ORAL | Status: DC | PRN
  Administered 2017-02-22 – 2017-02-23 (×3): 10 mg via ORAL
  Filled 2017-02-22 (×3): qty 2

## 2017-02-22 MED ORDER — FENTANYL CITRATE (PF) 100 MCG/2ML IJ SOLN
INTRAMUSCULAR | Status: DC | PRN
  Administered 2017-02-22 (×2): 25 ug via INTRAVENOUS
  Administered 2017-02-22: 50 ug via INTRAVENOUS

## 2017-02-22 MED ORDER — PHENYLEPHRINE 40 MCG/ML (10ML) SYRINGE FOR IV PUSH (FOR BLOOD PRESSURE SUPPORT)
PREFILLED_SYRINGE | INTRAVENOUS | Status: AC
  Filled 2017-02-22: qty 10

## 2017-02-22 MED ORDER — METHOCARBAMOL 500 MG PO TABS
500.0000 mg | ORAL_TABLET | Freq: Four times a day (QID) | ORAL | Status: DC | PRN
Start: 2017-02-22 — End: 2017-02-24
  Administered 2017-02-22 – 2017-02-24 (×6): 500 mg via ORAL
  Filled 2017-02-22 (×7): qty 1

## 2017-02-22 MED ORDER — METOCLOPRAMIDE HCL 5 MG/ML IJ SOLN: 5.0000 mg | Freq: Three times a day (TID) | INTRAMUSCULAR | Status: DC | PRN

## 2017-02-22 MED ORDER — TIZANIDINE HCL 4 MG PO TABS: 4.0000 mg | ORAL_TABLET | Freq: Three times a day (TID) | ORAL | Status: DC | PRN

## 2017-02-22 MED ORDER — MIDAZOLAM HCL 5 MG/5ML IJ SOLN
INTRAMUSCULAR | Status: DC | PRN
  Administered 2017-02-22: 2 mg via INTRAVENOUS

## 2017-02-22 MED ORDER — ACETAMINOPHEN 10 MG/ML IV SOLN
INTRAVENOUS | Status: AC
  Filled 2017-02-22: qty 100

## 2017-02-22 MED ORDER — LIDOCAINE 2% (20 MG/ML) 5 ML SYRINGE
INTRAMUSCULAR | Status: DC | PRN
  Administered 2017-02-22: 100 mg via INTRAVENOUS

## 2017-02-22 SURGICAL SUPPLY — 35 items
BAG DECANTER FOR FLEXI CONT (MISCELLANEOUS) IMPLANT
BAG ZIPLOCK 12X15 (MISCELLANEOUS) ×3 IMPLANT
BLADE SAG 18X100X1.27 (BLADE) ×3 IMPLANT
CAPT HIP TOTAL 2 ×3 IMPLANT
CLOSURE WOUND 1/2 X4 (GAUZE/BANDAGES/DRESSINGS) ×2
CLOTH BEACON ORANGE TIMEOUT ST (SAFETY) ×3 IMPLANT
COVER PERINEAL POST (MISCELLANEOUS) ×3 IMPLANT
COVER SURGICAL LIGHT HANDLE (MISCELLANEOUS) ×3 IMPLANT
DECANTER SPIKE VIAL GLASS SM (MISCELLANEOUS) ×3 IMPLANT
DRAPE STERI IOBAN 125X83 (DRAPES) ×3 IMPLANT
DRAPE U-SHAPE 47X51 STRL (DRAPES) ×6 IMPLANT
DRSG ADAPTIC 3X8 NADH LF (GAUZE/BANDAGES/DRESSINGS) ×3 IMPLANT
DRSG MEPILEX BORDER 4X4 (GAUZE/BANDAGES/DRESSINGS) ×3 IMPLANT
DRSG MEPILEX BORDER 4X8 (GAUZE/BANDAGES/DRESSINGS) ×3 IMPLANT
DURAPREP 26ML APPLICATOR (WOUND CARE) ×3 IMPLANT
ELECT REM PT RETURN 15FT ADLT (MISCELLANEOUS) ×3 IMPLANT
EVACUATOR 1/8 PVC DRAIN (DRAIN) ×3 IMPLANT
GLOVE BIO SURGEON STRL SZ7.5 (GLOVE) ×3 IMPLANT
GLOVE BIO SURGEON STRL SZ8 (GLOVE) ×6 IMPLANT
GLOVE BIOGEL PI IND STRL 8 (GLOVE) ×2 IMPLANT
GLOVE BIOGEL PI INDICATOR 8 (GLOVE) ×4
GOWN STRL REUS W/TWL LRG LVL3 (GOWN DISPOSABLE) ×3 IMPLANT
GOWN STRL REUS W/TWL XL LVL3 (GOWN DISPOSABLE) ×3 IMPLANT
PACK ANTERIOR HIP CUSTOM (KITS) ×3 IMPLANT
STRIP CLOSURE SKIN 1/2X4 (GAUZE/BANDAGES/DRESSINGS) ×4 IMPLANT
SUT ETHIBOND NAB CT1 #1 30IN (SUTURE) ×3 IMPLANT
SUT MNCRL AB 4-0 PS2 18 (SUTURE) ×3 IMPLANT
SUT STRATAFIX 0 PDS 27 VIOLET (SUTURE) ×3
SUT VIC AB 2-0 CT1 27 (SUTURE) ×4
SUT VIC AB 2-0 CT1 TAPERPNT 27 (SUTURE) ×2 IMPLANT
SUTURE STRATFX 0 PDS 27 VIOLET (SUTURE) ×1 IMPLANT
SYR 50ML LL SCALE MARK (SYRINGE) IMPLANT
TRAY FOLEY CATH SILVER 14FR (SET/KITS/TRAYS/PACK) ×3 IMPLANT
WATER STERILE IRR 1000ML POUR (IV SOLUTION) ×6 IMPLANT
YANKAUER SUCT BULB TIP 10FT TU (MISCELLANEOUS) ×3 IMPLANT

## 2017-02-22 NOTE — Interval H&P Note (Signed)
History and Physical Interval Note:  02/22/2017 9:50 AM  Roberta Clark  has presented today for surgery, with the diagnosis of Avascular necrosis Right hip   The various methods of treatment have been discussed with the patient and family. After consideration of risks, benefits and other options for treatment, the patient has consented to  Procedure(s): RIGHT TOTAL HIP ARTHROPLASTY ANTERIOR APPROACH (Right) as a surgical intervention .  The patient's history has been reviewed, patient examined, no change in status, stable for surgery.  I have reviewed the patient's chart and labs.  Questions were answered to the patient's satisfaction.     Gearlean Alf

## 2017-02-22 NOTE — Anesthesia Preprocedure Evaluation (Addendum)
Anesthesia Evaluation  Patient identified by MRN, date of birth, ID band Patient awake    Reviewed: Allergy & Precautions, NPO status , Patient's Chart, lab work & pertinent test results  Airway Mallampati: I  TM Distance: >3 FB Neck ROM: Full    Dental  (+) Teeth Intact, Dental Advisory Given, Missing,    Pulmonary Current Smoker,    Pulmonary exam normal        Cardiovascular hypertension, Pt. on medications Normal cardiovascular exam  ECG: NSR, rate 87   Neuro/Psych PSYCHIATRIC DISORDERS Depression    GI/Hepatic negative GI ROS, Neg liver ROS,   Endo/Other  diabetes, Type 2, Oral Hypoglycemic Agents  Renal/GU negative Renal ROS     Musculoskeletal  (+) Arthritis , Osteoarthritis,    Abdominal   Peds negative pediatric ROS (+)  Hematology  (+) Blood dyscrasia, anemia ,   Anesthesia Other Findings Low back pain Hyperlipidemia   Reproductive/Obstetrics                            Anesthesia Physical  Anesthesia Plan  ASA: III  Anesthesia Plan: Spinal   Post-op Pain Management:    Induction:   PONV Risk Score and Plan: 1 and Ondansetron, Dexamethasone and Propofol  Airway Management Planned:   Additional Equipment:   Intra-op Plan:   Post-operative Plan:   Informed Consent: I have reviewed the patients History and Physical, chart, labs and discussed the procedure including the risks, benefits and alternatives for the proposed anesthesia with the patient or authorized representative who has indicated his/her understanding and acceptance.   Dental advisory given  Plan Discussed with: CRNA, Anesthesiologist and Surgeon  Anesthesia Plan Comments: (Discussed risks and benefits of and differences between spinal and general. Discussed risks of spinal including headache, backache, failure, bleeding, infection, and nerve damage. Patient consents to spinal. Questions answered.  Coagulation studies and platelet count acceptable.)        Anesthesia Quick Evaluation

## 2017-02-22 NOTE — Transfer of Care (Signed)
Immediate Anesthesia Transfer of Care Note  Patient: Roberta Clark  Procedure(s) Performed: Procedure(s): RIGHT TOTAL HIP ARTHROPLASTY ANTERIOR APPROACH (Right)  Patient Location: PACU  Anesthesia Type:MAC and Spinal  Level of Consciousness: awake, alert , oriented and patient cooperative  Airway & Oxygen Therapy: Patient Spontanous Breathing and Patient connected to face mask oxygen  Post-op Assessment: Report given to RN, Post -op Vital signs reviewed and stable and Patient moving all extremities  Post vital signs: Reviewed and stable  Last Vitals:  Vitals:   02/22/17 0912  BP: 131/90  Pulse: 96  Resp: 16  Temp: 36.8 C    Last Pain:  Vitals:   02/22/17 0912  TempSrc: Oral      Patients Stated Pain Goal: 3 (02/07/93 8546)  Complications: No apparent anesthesia complications

## 2017-02-22 NOTE — H&P (View-Only) (Signed)
Roberta Clark DOB: 01-12-1961 Single / Language: Cleophus Molt / Race: Black or African American Female Date of Admission:  02/22/17 CC:  Right hip pain History of Present Illness  The patient is a 56 year old female who comes in for a preoperative History and Physical. The patient is scheduled for a right total hip arthroplasty (anterior) to be performed by Dr. Dione Plover. [name], MD at [facility] on 03/01/2017. The patient is a 56 year old female who was seen in referral from Park Liter PA-C.The patient reported bilateral hip pain, initially the left hip was worse than right. She has had pain that have been present for over 7 year(s). Symptoms reported include hip pain, weakness, night pain, stiffness, difficulty flexing hip and difficulty ambulating The patient reports symptoms radiating to the: right groin, left groin and left thigh, lateral and posterior. The patient feels as if their symptoms are does feel they are worsening. Symptoms are exacerbated by weight bearing and lying on the affected side. Prior to being seen, the patient was previously evaluated by a primary physician. Previous workup for this problem has included hip x-rays (available on the [facility] system). Previous treatment for this problem has included opioid analgesics. She has had problems with her left worse than right hip. She recently underwent surgery fot he left hip and has been doing well following that surgery. The right hip continues to be a problem though. It is limiting what she can and cannot do. She has been diagnosed with avascular necrosis of the hip. She has severe pain. The hip hurts her at night also. She is now ready to get the other hip fixed at this time. They have been treated conservatively in the past for the above stated problem and despite conservative measures, they continue to have progressive pain and severe functional limitations and dysfunction. They have failed non-operative management including  home exercise, medications. It is felt that they would benefit from undergoing total joint replacement. Risks and benefits of the procedure have been discussed with the patient and they elect to proceed with surgery. There are no active contraindications to surgery such as ongoing infection or rapidly progressive neurological disease.   Problem List/Past Medical  Avascular necrosis of hip, right Anemia  Diabetes Mellitus, Type II  High blood pressure  Hypercholesterolemia  Hemorrhoids  Menopause    Allergies Lipitor *ANTIHYPERLIPIDEMICS*  Rash. MetFORMIN HCl *ANTIDIABETICS*  Nausea.  Family History  Diabetes Mellitus  mother and brother Heart Disease  mother Hypertension  mother, sister and brother  Social History  Alcohol use  current drinker; drinks beer and hard liquor; only occasionally per week Children  2 Current work status  unemployed Drug/Alcohol Rehab (Currently)  no Drug/Alcohol Rehab (Previously)  no Exercise  Exercises rarely; does running / walking Illicit drug use  no Living situation  live alone Marital status  single Number of flights of stairs before winded  less than 1 Tobacco / smoke exposure  no Tobacco use  current some days smoker; smoke(d) less than 1/2 pack(s) per day  Medication History OxyCODONE HCl (5MG  Capsule, Oral) Active. TraMADol HCl (50MG  Tablet, Oral) Active. Carisoprodol (350MG  Tablet, Oral) Active. Hydrocodone-Acetaminophen (5-325MG  Tablet, Oral) Active. TiZANidine HCl (4MG  Capsule, Oral) Active. Cyclobenzaprine HCl (10MG  Tablet, Oral) Active. Aleve PM (220-25MG  Tablet, Oral) Active. Amitriptyline HCl (25MG  Tablet, Oral) Active. Centrum Silver (Oral) Active. Fish Oil (1000MG  Capsule, Oral) Active. FLUoxetine HCl (20MG  Capsule, Oral) Active. Gabapentin (300MG  Capsule, Oral) Active. Gemfibrozil (600MG  Tablet, Oral) Active. Ibuprofen (800MG   Tablet, Oral) Active. Lisinopril-Hydrochlorothiazide  (20-12.5MG  Tablet, Oral) Active. Potassium Chloride (10MEQ Capsule ER, Oral) Active.   Past Surgical History  Spinal Surgery  Tubal Ligation    Review of Systems  General Present- Appetite Loss, Feeling sick, Night Sweats, Weight Gain and Weight Loss. Not Present- Chills, Fatigue, Fever and Memory Loss. Skin Not Present- Eczema, Hives, Itching, Lesions and Rash. HEENT Present- Blurred Vision. Not Present- Dentures, Double Vision, Headache, Hearing Loss, Tinnitus and Visual Loss. Respiratory Not Present- Allergies, Chronic Cough, Coughing up blood, Shortness of breath at rest and Shortness of breath with exertion. Cardiovascular Not Present- Chest Pain, Difficulty Breathing Lying Down, Murmur, Palpitations, Racing/skipping heartbeats and Swelling. Gastrointestinal Present- Loss of appetitie and Nausea. Not Present- Abdominal Pain, Bloody Stool, Constipation, Diarrhea, Difficulty Swallowing, Heartburn, Jaundice and Vomiting. Female Genitourinary Not Present- Blood in Urine, Discharge, Flank Pain, Incontinence, Painful Urination, Urgency, Urinary frequency, Urinary Retention, Urinating at Night and Weak urinary stream. Musculoskeletal Present- Back Pain and Spasms. Not Present- Joint Pain, Joint Swelling, Morning Stiffness, Muscle Pain and Muscle Weakness. Neurological Present- Burning. Not Present- Blackout spells, Difficulty with balance, Dizziness, Paralysis, Tremor and Weakness. Psychiatric Not Present- Insomnia. Endocrine Present- Appetite Changes and Hot flashes. Hematology Present- Anemia.  Vitals  Weight: 150 lb Height: 64in Weight was reported by patient. Height was reported by patient. Body Surface Area: 1.73 m Body Mass Index: 25.75 kg/m  Pulse: 76 (Regular)  BP: 134/72 (Sitting, Left Arm, Standard)   Physical Exam General Mental Status -Alert, cooperative and good historian. General Appearance-pleasant, Not in acute distress. Orientation-Oriented  X3. Build & Nutrition-Well nourished and Well developed.  Head and Neck Head-normocephalic, atraumatic . Neck Global Assessment - supple, no bruit auscultated on the right, no bruit auscultated on the left.  Eye Pupil - Bilateral-Regular and Round. Motion - Bilateral-EOMI.  Chest and Lung Exam Auscultation Breath sounds - clear at anterior chest wall and clear at posterior chest wall. Adventitious sounds - No Adventitious sounds.  Cardiovascular Auscultation Rhythm - Regular rate and rhythm. Heart Sounds - S1 WNL and S2 WNL. Murmurs & Other Heart Sounds - Auscultation of the heart reveals - No Murmurs.  Abdomen Palpation/Percussion Tenderness - Abdomen is non-tender to palpation. Rigidity (guarding) - Abdomen is soft. Auscultation Auscultation of the abdomen reveals - Bowel sounds normal.  Female Genitourinary Note: Not done, not pertinent to present illness   Musculoskeletal Note: Her left hip looks great. Negative flexion to 110, rotated in 20, out 30, abduct 30 without discomfort. Right hip flexion about 100, rotation in 20, out 30, abduct 30 with discomfort.  RADIOGRAPHS Her radiograph, AP pelvis, AP and lateral of the left hip showed her prosthesis on the left to be in excellent position with no periprosthetic abnormalities. On the right, she does have an area of subchondral sclerosis and a faint crescent sign.  Assessment & Plan Avascular necrosis of hip, right (M87.051) Aftercare following left hip joint replacement surgery (Z47.1, O84.166)  Note:Surgical Plans: Right Total Hip Replacement - Anterior Approach  Disposition: Home  PCP: Dr. Amil Amen  IV TXA  Anesthesia Issues: None  Patient was instructed on what medications to stop prior to surgery.  Signed electronically by Joelene Millin, III PA-C

## 2017-02-22 NOTE — Anesthesia Procedure Notes (Signed)
Spinal  Patient location during procedure: OR End time: 02/22/2017 11:53 AM Staffing Resident/CRNA: Carleene Cooper A Performed: resident/CRNA  Preanesthetic Checklist Completed: patient identified, site marked, surgical consent, pre-op evaluation, timeout performed, IV checked, risks and benefits discussed and monitors and equipment checked Spinal Block Patient position: sitting Prep: site prepped and draped and DuraPrep Patient monitoring: heart rate, continuous pulse ox and blood pressure Approach: midline Location: L2-3 Injection technique: single-shot Needle Needle type: Pencan  Needle gauge: 24 G Needle length: 10 cm Assessment Sensory level: T4 Additional Notes Pt placed in sitting position. Spinal kit expiration date checked and verified. One attempt by CRNA. -heme, + CSF. Pt tolerated well.

## 2017-02-22 NOTE — Discharge Instructions (Addendum)
° °Dr. Frank [name] °Total Joint Specialist °[facility] °[address] °[place], [place] [address] °[phone] ° °ANTERIOR APPROACH TOTAL HIP REPLACEMENT POSTOPERATIVE DIRECTIONS ° ° °Hip Rehabilitation, Guidelines Following Surgery  °The results of a hip operation are greatly improved after range of motion and muscle strengthening exercises. Follow all safety measures which are given to protect your hip. If any of these exercises cause increased pain or swelling in your joint, decrease the amount until you are comfortable again. Then slowly increase the exercises. Call your caregiver if you have problems or questions.  ° °HOME CARE INSTRUCTIONS  °Remove items at home which could result in a fall. This includes throw rugs or furniture in walking pathways.  °· ICE to the affected hip every three hours for 30 minutes at a time and then as needed for pain and swelling.  Continue to use ice on the hip for pain and swelling from surgery. You may notice swelling that will progress down to the foot and ankle.  This is normal after surgery.  Elevate the leg when you are not up walking on it.   °· Continue to use the breathing machine which will help keep your temperature down.  It is common for your temperature to cycle up and down following surgery, especially at night when you are not up moving around and exerting yourself.  The breathing machine keeps your lungs expanded and your temperature down. ° ° °DIET °You may resume your previous home diet once your are discharged from the hospital. ° °DRESSING / WOUND CARE / SHOWERING °You may shower 3 days after surgery, but keep the wounds dry during showering.  You may use an occlusive plastic wrap (Press'n Seal for example), NO SOAKING/SUBMERGING IN THE BATHTUB.  If the bandage gets wet, change with a clean dry gauze.  If the incision gets wet, pat the wound dry with a clean towel. °You may start showering once you are discharged home but do not  submerge the incision under water. Just pat the incision dry and apply a dry gauze dressing on daily. °Change the surgical dressing daily and reapply a dry dressing each time. ° °ACTIVITY °Walk with your walker as instructed. °Use walker as long as suggested by your caregivers. °Avoid periods of inactivity such as sitting longer than an hour when not asleep. This helps prevent blood clots.  °You may resume a sexual relationship in one month or when given the OK by your doctor.  °You may return to work once you are cleared by your doctor.  °Do not drive a car for 6 weeks or until released by you surgeon.  °Do not drive while taking narcotics. ° °WEIGHT BEARING °Weight bearing as tolerated with assist device (walker, cane, etc) as directed, use it as long as suggested by your surgeon or therapist, typically at least 4-6 weeks. ° °POSTOPERATIVE CONSTIPATION PROTOCOL °Constipation - defined medically as fewer than three stools per week and severe constipation as less than one stool per week. ° °One of the most common issues patients have following surgery is constipation.  Even if you have a regular bowel pattern at home, your normal regimen is likely to be disrupted due to multiple reasons following surgery.  Combination of anesthesia, postoperative narcotics, change in appetite and fluid intake all can affect your bowels.  In order to avoid complications following surgery, here are some recommendations in order to help you during your recovery period. ° °Colace (docusate) - Pick up an over-the-counter   form of Colace or another stool softener and take twice a day as long as you are requiring postoperative pain medications.  Take with a full glass of water daily.  If you experience loose stools or diarrhea, hold the colace until you stool forms back up.  If your symptoms do not get better within 1 week or if they get worse, check with your doctor. ° °Dulcolax (bisacodyl) - Pick up over-the-counter and take as directed  by the product packaging as needed to assist with the movement of your bowels.  Take with a full glass of water.  Use this product as needed if not relieved by Colace only.  ° °MiraLax (polyethylene glycol) - Pick up over-the-counter to have on hand.  MiraLax is a solution that will increase the amount of water in your bowels to assist with bowel movements.  Take as directed and can mix with a glass of water, juice, soda, coffee, or tea.  Take if you go more than two days without a movement. °Do not use MiraLax more than once per day. Call your doctor if you are still constipated or irregular after using this medication for 7 days in a row. ° °If you continue to have problems with postoperative constipation, please contact the office for further assistance and recommendations.  If you experience "the worst abdominal pain ever" or develop nausea or vomiting, please contact the office immediatly for further recommendations for treatment. ° °ITCHING ° If you experience itching with your medications, try taking only a single pain pill, or even half a pain pill at a time.  You can also use Benadryl over the counter for itching or also to help with sleep.  ° °TED HOSE STOCKINGS °Wear the elastic stockings on both legs for three weeks following surgery during the day but you may remove then at night for sleeping. ° °MEDICATIONS °See your medication summary on the “After Visit Summary” that the nursing staff will review with you prior to discharge.  You may have some home medications which will be placed on hold until you complete the course of blood thinner medication.  It is important for you to complete the blood thinner medication as prescribed by your surgeon.  Continue your approved medications as instructed at time of discharge. ° °PRECAUTIONS °If you experience chest pain or shortness of breath - call 911 immediately for transfer to the hospital emergency department.  °If you develop a fever greater that 101 F,  purulent drainage from wound, increased redness or drainage from wound, foul odor from the wound/dressing, or calf pain - CONTACT YOUR SURGEON.   °                                                °FOLLOW-UP APPOINTMENTS °Make sure you keep all of your appointments after your operation with your surgeon and caregivers. You should call the office at the above phone number and make an appointment for approximately two weeks after the date of your surgery or on the date instructed by your surgeon outlined in the "After Visit Summary". ° °RANGE OF MOTION AND STRENGTHENING EXERCISES  °These exercises are designed to help you keep full movement of your hip joint. Follow your caregiver's or physical therapist's instructions. Perform all exercises about fifteen times, three times per day or as directed. Exercise both hips, even if you   have had only one joint replacement. These exercises can be done on a training (exercise) mat, on the floor, on a table or on a bed. Use whatever works the best and is most comfortable for you. Use music or television while you are exercising so that the exercises are a pleasant break in your day. This will make your life better with the exercises acting as a break in routine you can look forward to.  Lying on your back, slowly slide your foot toward your buttocks, raising your knee up off the floor. Then slowly slide your foot back down until your leg is straight again.  Lying on your back spread your legs as far apart as you can without causing discomfort.  Lying on your side, raise your upper leg and foot straight up from the floor as far as is comfortable. Slowly lower the leg and repeat.  Lying on your back, tighten up the muscle in the front of your thigh (quadriceps muscles). You can do this by keeping your leg straight and trying to raise your heel off the floor. This helps strengthen the largest muscle supporting your knee.  Lying on your back, tighten up the muscles of your  buttocks both with the legs straight and with the knee bent at a comfortable angle while keeping your heel on the floor.   IF YOU ARE TRANSFERRED TO A SKILLED REHAB FACILITY If the patient is transferred to a skilled rehab facility following release from the hospital, a list of the current medications will be sent to the facility for the patient to continue.  When discharged from the skilled rehab facility, please have the facility set up the patient's [place] prior to being released. Also, the skilled facility will be responsible for providing the patient with their medications at time of release from the facility to include their pain medication, the muscle relaxants, and their blood thinner medication. If the patient is still at the rehab facility at time of the two week follow up appointment, the skilled rehab facility will also need to assist the patient in arranging follow up appointment in our office and any transportation needs.  MAKE SURE YOU:  Understand these instructions.  Get help right away if you are not doing well or get worse.    Pick up stool softner and laxative for home use following surgery while on pain medications. Do not submerge incision under water. Please use good hand washing techniques while changing dressing each day. May shower starting three days after surgery. Please use a clean towel to pat the incision dry following showers. Continue to use ice for pain and swelling after surgery. Do not use any lotions or creams on the incision until instructed by your surgeon.  Take Xarelto for two and a half more weeks following discharge from the hospital, then discontinue Xarelto. Once the patient has completed the blood thinner regimen, then take a Baby 81 mg Aspirin daily for three more weeks.    Information on my medicine - XARELTO (Rivaroxaban)  This medication education was reviewed with me or my healthcare representative as part of my  discharge preparation.  The pharmacist that spoke with me during my hospital stay was:   Why was Xarelto prescribed for you? Xarelto was prescribed for you to reduce the risk of blood clots forming after orthopedic surgery. The medical term for these abnormal blood clots is venous thromboembolism (VTE).  What do you need to know about xarelto ? Take  your Xarelto ONCE DAILY at the same time every day. You may take it either with or without food.  If you have difficulty swallowing the tablet whole, you may crush it and mix in applesauce just prior to taking your dose.  Take Xarelto exactly as prescribed by your doctor and DO NOT stop taking Xarelto without talking to the doctor who prescribed the medication.  Stopping without other VTE prevention medication to take the place of Xarelto may increase your risk of developing a clot.  After discharge, you should have regular check-up appointments with your healthcare provider that is prescribing your Xarelto.    What do you do if you miss a dose? If you miss a dose, take it as soon as you remember on the same day then continue your regularly scheduled once daily regimen the next day. Do not take two doses of Xarelto on the same day.   Important Safety Information A possible side effect of Xarelto is bleeding. You should call your healthcare provider right away if you experience any of the following: ? Bleeding from an injury or your nose that does not stop. ? Unusual colored urine (red or dark brown) or unusual colored stools (red or black). ? Unusual bruising for unknown reasons. ? A serious fall or if you hit your head (even if there is no bleeding).  Some medicines may interact with Xarelto and might increase your risk of bleeding while on Xarelto. To help avoid this, consult your healthcare provider or pharmacist prior to using any new prescription or non-prescription medications, including herbals, vitamins, non-steroidal  anti-inflammatory drugs (NSAIDs) and supplements.  This website has more information on Xarelto: [email or web address]

## 2017-02-22 NOTE — Anesthesia Postprocedure Evaluation (Signed)
Anesthesia Post Note  Patient: Roberta Clark  Procedure(s) Performed: Procedure(s) (LRB): RIGHT TOTAL HIP ARTHROPLASTY ANTERIOR APPROACH (Right)     Patient location during evaluation: PACU Anesthesia Type: Spinal Level of consciousness: oriented and awake and alert Pain management: pain level controlled Vital Signs Assessment: post-procedure vital signs reviewed and stable Respiratory status: spontaneous breathing, respiratory function stable and patient connected to nasal cannula oxygen Cardiovascular status: blood pressure returned to baseline and stable Postop Assessment: no headache and no backache Anesthetic complications: no    Last Vitals:  Vitals:   02/22/17 1445 02/22/17 1517  BP: 117/81 132/83  Pulse: 77 78  Resp: 17 16  Temp: (!) 36.3 C 36.5 C    Last Pain:  Vitals:   02/22/17 1445  TempSrc:   PainSc: Asleep                 Monea Pesantez P Amilya Haver

## 2017-02-22 NOTE — Op Note (Signed)
OPERATIVE REPORT- TOTAL HIP ARTHROPLASTY   PREOPERATIVE DIAGNOSIS: Osteonecrosis of the Right hip.   POSTOPERATIVE DIAGNOSIS: Osteonecrosis of the Right  hip.   PROCEDURE: Right total hip arthroplasty, anterior approach.   SURGEON: Gaynelle Arabian, MD   ASSISTANT: Arlee Muslim, PA-C  ANESTHESIA:  Spinal  ESTIMATED BLOOD LOSS:-600 ml    DRAINS: Hemovac x1.   COMPLICATIONS: None   CONDITION: PACU - hemodynamically stable.   BRIEF CLINICAL NOTE: Roberta Clark is a 56 y.o. female who has advanced end-  stage AVN of their Right  hip with progressively worsening pain and  dysfunction.The patient has failed nonoperative management and presents for  total hip arthroplasty.   PROCEDURE IN DETAIL: After successful administration of spinal  anesthetic, the traction boots for the [facility], The bed were placed on both  feet and the patient was placed onto the [facility] bed, boots placed into the leg  holders. The Right hip was then isolated from the perineum with plastic  drapes and prepped and draped in the usual sterile fashion. ASIS and  greater trochanter were marked and a oblique incision was made, starting  at about 1 cm lateral and 2 cm distal to the ASIS and coursing towards  the anterior cortex of the femur. The skin was cut with a 10 blade  through subcutaneous tissue to the level of the fascia overlying the  tensor fascia lata muscle. The fascia was then incised in line with the  incision at the junction of the anterior third and posterior 2/3rd. The  muscle was teased off the fascia and then the interval between the TFL  and the rectus was developed. The Hohmann retractor was then placed at  the top of the femoral neck over the capsule. The vessels overlying the  capsule were cauterized and the fat on top of the capsule was removed.  A Hohmann retractor was then placed anterior underneath the rectus  femoris to give exposure to the entire anterior capsule. A T-shaped  capsulotomy  was performed. The edges were tagged and the femoral head  was identified.       Osteophytes are removed off the superior acetabulum.  The femoral neck was then cut in situ with an oscillating saw. Traction  was then applied to the left lower extremity utilizing the [facility]  traction. The femoral head was then removed. Retractors were placed  around the acetabulum and then circumferential removal of the labrum was  performed. Osteophytes were also removed. Reaming starts at 45 mm to  medialize and  Increased in 2 mm increments to 47 mm. We reamed in  approximately 40 degrees of abduction, 20 degrees anteversion. A 48 mm  pinnacle acetabular shell was then impacted in anatomic position under  fluoroscopic guidance with excellent purchase. We did not need to place  any additional dome screws. A 28 mm neutral + 4 marathon liner was then  placed into the acetabular shell.       The femoral lift was then placed along the lateral aspect of the femur  just distal to the vastus ridge. The leg was  externally rotated and capsule  was stripped off the inferior aspect of the femoral neck down to the  level of the lesser trochanter, this was done with electrocautery. The femur was lifted after this was performed. The  leg was then placed in an extended and adducted position essentially delivering the femur. We also removed the capsule superiorly and the piriformis from the piriformis  fossa to gain excellent exposure of the  proximal femur. Rongeur was used to remove some cancellous bone to get  into the lateral portion of the proximal femur for placement of the  initial starter reamer. The starter broaches was placed  the starter broach  and was shown to go down the center of the canal. Broaching  with the  Corail system was then performed starting at size 8, coursing  Up to size 11. A size 11 had excellent torsional and rotational  and axial stability. The trial high offset neck was then placed  with a 28  + 5 trial head. The hip was then reduced. We confirmed that  the stem was in the canal both on AP and lateral x-rays. It also has excellent sizing. The hip was reduced with outstanding stability through full extension and full external rotation.. AP pelvis was taken and the leg lengths were measured and found to be equal. Hip was then dislocated again and the femoral head and neck removed. The  femoral broach was removed. Size 11 Corail stem with a high offset  neck was then impacted into the femur following native anteversion. Has  excellent purchase in the canal. Excellent torsional and rotational and  axial stability. It is confirmed to be in the canal on AP and lateral  fluoroscopic views. The 28 + 5 ceramic head was placed and the hip  reduced with outstanding stability. Again AP pelvis was taken and it  confirmed that the leg lengths were equal. The wound was then copiously  irrigated with saline solution and the capsule reattached and repaired  with Ethibond suture. 30 ml of .25% Bupivicaine was  injected into the capsule and into the edge of the tensor fascia lata as well as subcutaneous tissue. The fascia overlying the tensor fascia lata was then closed with a running #1 V-Loc. Subcu was closed with interrupted 2-0 Vicryl and subcuticular running 4-0 Monocryl. Incision was cleaned  and dried. Steri-Strips and a bulky sterile dressing applied. Hemovac  drain was hooked to suction and then the patient was awakened and transported to  recovery in stable condition.        Please note that a surgical assistant was a medical necessity for this procedure to perform it in a safe and expeditious manner. Assistant was necessary to provide appropriate retraction of vital neurovascular structures and to prevent femoral fracture and allow for anatomic placement of the prosthesis.  Gaynelle Arabian, M.D.

## 2017-02-23 ENCOUNTER — Encounter (HOSPITAL_COMMUNITY): Payer: Self-pay | Admitting: Orthopedic Surgery

## 2017-02-23 LAB — CBC
HEMATOCRIT: 27.9 % — AB (ref 36.0–46.0)
Hemoglobin: 9.4 g/dL — ABNORMAL LOW (ref 12.0–15.0)
MCH: 28.8 pg (ref 26.0–34.0)
MCHC: 33.7 g/dL (ref 30.0–36.0)
MCV: 85.6 fL (ref 78.0–100.0)
Platelets: 305 10*3/uL (ref 150–400)
RBC: 3.26 MIL/uL — ABNORMAL LOW (ref 3.87–5.11)
RDW: 14.5 % (ref 11.5–15.5)
WBC: 11.6 10*3/uL — ABNORMAL HIGH (ref 4.0–10.5)

## 2017-02-23 LAB — BASIC METABOLIC PANEL
Anion gap: 9 (ref 5–15)
BUN: 15 mg/dL (ref 6–20)
CO2: 24 mmol/L (ref 22–32)
Calcium: 9.2 mg/dL (ref 8.9–10.3)
Chloride: 106 mmol/L (ref 101–111)
Creatinine, Ser: 0.72 mg/dL (ref 0.44–1.00)
GFR calc Af Amer: >60 mL/min (ref >60)
Glucose, Bld: 153 mg/dL — ABNORMAL HIGH (ref 65–99)
Potassium: 4.9 mmol/L (ref 3.5–5.1)
Sodium: 139 mmol/L (ref 135–145)

## 2017-02-23 MED ORDER — HYDROMORPHONE HCL-NACL 0.5-0.9 MG/ML-% IV SOSY
1.0000 mg | PREFILLED_SYRINGE | INTRAVENOUS | Status: DC | PRN
  Administered 2017-02-23: 1 mg via INTRAVENOUS
  Filled 2017-02-23: qty 2

## 2017-02-23 MED ORDER — POLYSACCHARIDE IRON COMPLEX 150 MG PO CAPS
150.0000 mg | ORAL_CAPSULE | Freq: Every day | ORAL | Status: DC
  Administered 2017-02-23 – 2017-02-24 (×2): 150 mg via ORAL
  Filled 2017-02-23 (×2): qty 1

## 2017-02-23 MED ORDER — OXYCODONE HCL 5 MG PO TABS
5.0000 mg | ORAL_TABLET | ORAL | Status: DC | PRN
  Administered 2017-02-23: 08:00:00 15 mg via ORAL
  Administered 2017-02-23: 13:00:00 10 mg via ORAL
  Administered 2017-02-23: 05:00:00 15 mg via ORAL
  Administered 2017-02-23: 17:00:00 10 mg via ORAL
  Administered 2017-02-23 – 2017-02-24 (×4): 15 mg via ORAL
  Filled 2017-02-23 (×2): qty 3
  Filled 2017-02-23: qty 2
  Filled 2017-02-23: qty 3
  Filled 2017-02-23: qty 2
  Filled 2017-02-23 (×3): qty 3

## 2017-02-23 NOTE — Clinical Social Work Note (Signed)
Clinical Social Work Assessment  Patient Details  Name: Roberta Clark MRN: [id number] Date of Birth: 1960-08-18  Date of referral:  02/23/17               Reason for consult:  Facility Placement, Discharge Planning                Permission sought to share information with:  Facility Art therapist granted to share information::  Yes, Verbal Permission Granted  Name::        Agency::     Relationship::     Contact Information:     Housing/Transportation Living arrangements for the past 2 months:  Single Family Home Source of Information:  Patient Patient Interpreter Needed:  None Criminal Activity/Legal Involvement Pertinent to Current Situation/Hospitalization:  No - Comment as needed Significant Relationships:  Parents Lives with:  Self Do you feel safe going back to the place where you live?   (Requesting SNF placement.) Need for family participation in patient care:  No (Coment)  Care giving concerns:  Pt does not have adequate assistance at home following surgery.   Social Worker assessment / plan:  Pt hospitalized on 02/22/17 for pre planned right total hip arthroplasty. PT eval is pending. CSW met with pt at bedside to assist with d/c planning. Pt had ST Rehab at [facility] last March following surgery and reports that she had a good experience at [facility]. Pt reports that she will need ST Rehab following hospital dc and would like to return to [facility]. SNF search initiated and VM left for Admissions at [facility]. Awaiting return call. CSW will continue to follow to assist with d/c planning needs.   Employment status:  Disabled (Comment on whether or not currently receiving Disability) Insurance information:  Medicaid In [place] PT Recommendations:  Not assessed at this time Information / Referral to community resources:     Patient/Family's Response to care:  Pt feels ST Rehab is needed.  Patient/Family's Understanding of and Emotional Response to  Diagnosis, Current Treatment, and Prognosis:  Pt is aware of her medical status. She is motivated to work with PT and is hoping [place] will have an opening for her at dc.  Emotional Assessment Appearance:  Appears stated age Attitude/Demeanor/Rapport:  Other (cooperative) Affect (typically observed):  Appropriate, Calm, Pleasant Orientation:  Oriented to Self, Oriented to Place, Oriented to  Time, Oriented to Situation Alcohol / Substance use:  Not Applicable Psych involvement (Current and /or in the community):  No (Comment)  Discharge Needs  Concerns to be addressed:  Discharge Planning Concerns Readmission within the last 30 days:  No Current discharge risk:  None Barriers to Discharge:  No Barriers Identified   Loraine Maple  [phone]  [phone] 02/23/2017, 9:36 AM

## 2017-02-23 NOTE — Progress Notes (Signed)
Physical Therapy Treatment Patient Details Name: ARYAH DOERING MRN: [id number] DOB: [birth year] Today's Date: 02/23/2017    History of Present Illness Pt s/p R THR and with hx of L THR, chronic pain, and lumbar laminectomy    PT Comments    Pt continues very motivated with limited this pm by increased pain.  RN aware.   Follow Up Recommendations  SNF     Equipment Recommendations  None recommended by PT    Recommendations for Other Services       Precautions / Restrictions Precautions Precautions: Fall Restrictions Weight Bearing Restrictions: No Other Position/Activity Restrictions: WBAT    Mobility  Bed Mobility Overal bed mobility: Needs Assistance Bed Mobility: Sit to Supine;Supine to Sit     Supine to sit: Min guard Sit to supine: Min assist   General bed mobility comments: cues for sequence and use of L LE to self assist  Transfers Overall transfer level: Needs assistance Equipment used: Rolling walker (2 wheeled) Transfers: Sit to/from Stand Sit to Stand: Min assist         General transfer comment: cues for LE management and use of UEs to self assist  Ambulation/Gait Ambulation/Gait assistance: Min assist Ambulation Distance (Feet): 85 Feet Assistive device: Rolling walker (2 wheeled) Gait Pattern/deviations: Decreased step length - right;Step-to pattern;Step-through pattern;Shuffle;Trunk flexed Gait velocity: decr Gait velocity interpretation: Below normal speed for age/gender General Gait Details: Increased time wtih cues for sequence, posture and position from [employer]            Wheelchair Mobility    Modified Rankin (Stroke Patients Only)       Balance                                            Cognition Arousal/Alertness: Awake/alert Behavior During Therapy: WFL for tasks assessed/performed Overall Cognitive Status: Within Functional Limits for tasks assessed                                         Exercises Total Joint Exercises Ankle Circles/Pumps: AROM;Both;20 reps;Supine Quad Sets: AROM;Both;10 reps;Supine Heel Slides: AAROM;Right;20 reps;Supine Hip ABduction/ADduction: AAROM;Right;15 reps;Supine    General Comments        Pertinent Vitals/Pain Pain Assessment: 0-10 Pain Score: 8  Pain Location: R hip Pain Descriptors / Indicators: Aching;Sore Pain Intervention(s): Limited activity within patient's tolerance;Monitored during session;Premedicated before session;Ice applied    Home Living Family/patient expects to be discharged to:: Skilled nursing facility Living Arrangements: Alone   Type of Home: Apartment Home Access: Stairs to enter   Home Layout: One level        Prior Function Level of Independence: Independent;Independent with assistive device(s)      Comments: amb with cane prior to surgery   PT Goals (current goals can now be found in the care plan section) Acute Rehab PT Goals Patient Stated Goal: Rehab and then home PT Goal Formulation: With patient Time For Goal Achievement: 02/27/17 Potential to Achieve Goals: Good Progress towards PT goals: Progressing toward goals    Frequency    7X/week      PT Plan Current plan remains appropriate    Co-evaluation              AM-PAC PT "6 Clicks" Daily Activity  Outcome Measure  Difficulty turning over in bed (including adjusting bedclothes, sheets and blankets)?: Total Difficulty moving from lying on back to sitting on the side of the bed? : Total Difficulty sitting down on and standing up from a chair with arms (e.g., wheelchair, bedside commode, etc,.)?: Total Help needed moving to and from a bed to chair (including a wheelchair)?: A Little Help needed walking in hospital room?: A Little Help needed climbing 3-5 steps with a railing? : A Lot 6 Click Score: 11    End of Session Equipment Utilized During Treatment: Gait belt Activity Tolerance: Patient tolerated  treatment well;Patient limited by pain Patient left: in bed;with call bell/phone within reach Nurse Communication: Mobility status PT Visit Diagnosis: Difficulty in walking, not elsewhere classified (R26.2)     Time: 7673-4193 PT Time Calculation (min) (ACUTE ONLY): 26 min  Charges:  $Gait Training: 23-37 mins $Therapeutic Exercise: 8-22 mins                    G Codes:       Pg [phone]    Young Mulvey 02/23/2017, 3:42 PM

## 2017-02-23 NOTE — Evaluation (Signed)
Physical Therapy Evaluation Patient Details Name: Roberta Clark MRN: [id number] DOB: [birth year] Today's Date: 02/23/2017   History of Present Illness  Pt s/p R THR and with hx of L THR, chronic pain, and lumbar laminectomy  Clinical Impression  Pt s/p R THR and presents with decreased R LE strength/ROM and post op pain limiting functional mobility.  Pt would benefit from follow up rehab at SNF level to maximize IND and safety prior to return home with ltd assist.    Follow Up Recommendations SNF    Equipment Recommendations  None recommended by PT    Recommendations for Other Services       Precautions / Restrictions Precautions Precautions: Fall Restrictions Weight Bearing Restrictions: No Other Position/Activity Restrictions: WBAT      Mobility  Bed Mobility Overal bed mobility: Needs Assistance Bed Mobility: Supine to Sit     Supine to sit: Min assist     General bed mobility comments: cues for sequence and use of L LE to self assist  Transfers Overall transfer level: Needs assistance Equipment used: Rolling walker (2 wheeled) Transfers: Sit to/from Stand Sit to Stand: Min assist         General transfer comment: cues for LE management and use of UEs to self assist  Ambulation/Gait Ambulation/Gait assistance: Min assist Ambulation Distance (Feet): 85 Feet Assistive device: Rolling walker (2 wheeled) Gait Pattern/deviations: Decreased step length - right;Step-to pattern;Step-through pattern;Shuffle;Trunk flexed Gait velocity: decr Gait velocity interpretation: Below normal speed for age/gender General Gait Details: cues for sequence, posture and position from [employer]            Wheelchair Mobility    Modified Rankin (Stroke Patients Only)       Balance                                             Pertinent Vitals/Pain Pain Assessment: 0-10 Pain Score: 5  Pain Location: R hip Pain Descriptors / Indicators:  Aching;Sore Pain Intervention(s): Limited activity within patient's tolerance;Monitored during session;Premedicated before session;Ice applied    Home Living Family/patient expects to be discharged to:: Skilled nursing facility Living Arrangements: Alone   Type of Home: Apartment Home Access: Stairs to enter   Entrance Stairs-Number of Steps: 3 front/8 back--with rails Home Layout: One level        Prior Function Level of Independence: Independent;Independent with assistive device(s)         Comments: amb with cane prior to surgery     Hand Dominance        Extremity/Trunk Assessment   Upper Extremity Assessment Upper Extremity Assessment: Overall WFL for tasks assessed    Lower Extremity Assessment Lower Extremity Assessment: RLE deficits/detail RLE Deficits / Details: 2/5 strength at hip with AAROM at hip to 75 flex and 15 abd    Cervical / Trunk Assessment Cervical / Trunk Assessment: Normal  Communication   Communication: No difficulties  Cognition Arousal/Alertness: Awake/alert Behavior During Therapy: WFL for tasks assessed/performed Overall Cognitive Status: Within Functional Limits for tasks assessed                                        General Comments      Exercises Total Joint Exercises Ankle Circles/Pumps: AROM;Both;20 reps;Supine Quad Sets: AROM;Both;10 reps;Supine  Heel Slides: AAROM;Right;20 reps;Supine Hip ABduction/ADduction: AAROM;Right;15 reps;Supine   Assessment/Plan    PT Assessment Patient needs continued PT services  PT Problem List Decreased strength;Decreased range of motion;Decreased activity tolerance;Decreased mobility;Pain;Decreased knowledge of use of DME       PT Treatment Interventions DME instruction;Gait training;Stair training;Functional mobility training;Therapeutic activities;Therapeutic exercise;Patient/family education    PT Goals (Current goals can be found in the Care Plan section)  Acute  Rehab PT Goals Patient Stated Goal: Rehab and then home PT Goal Formulation: With patient Time For Goal Achievement: 02/27/17 Potential to Achieve Goals: Good    Frequency 7X/week   Barriers to discharge        Co-evaluation               AM-PAC PT "6 Clicks" Daily Activity  Outcome Measure Difficulty turning over in bed (including adjusting bedclothes, sheets and blankets)?: Total Difficulty moving from lying on back to sitting on the side of the bed? : Total Difficulty sitting down on and standing up from a chair with arms (e.g., wheelchair, bedside commode, etc,.)?: Total Help needed moving to and from a bed to chair (including a wheelchair)?: A Little Help needed walking in hospital room?: A Little Help needed climbing 3-5 steps with a railing? : A Lot 6 Click Score: 11    End of Session Equipment Utilized During Treatment: Gait belt Activity Tolerance: Patient tolerated treatment well;Patient limited by pain Patient left: in chair;with call bell/phone within reach Nurse Communication: Mobility status PT Visit Diagnosis: Difficulty in walking, not elsewhere classified (R26.2)    Time: 8099-8338 PT Time Calculation (min) (ACUTE ONLY): 30 min   Charges:   PT Evaluation $PT Eval Low Complexity: 1 Procedure PT Treatments $Therapeutic Exercise: 8-22 mins   PT G Codes:        Pg [phone]   Roberta Clark 02/23/2017, 1:12 PM

## 2017-02-23 NOTE — Clinical Social Work Placement (Signed)
   CLINICAL SOCIAL WORK PLACEMENT  NOTE  Date:  02/23/2017  Patient Details  Name: Roberta Clark MRN: [id number] Date of Birth: Jul 21, 1961  Clinical Social Work is seeking post-discharge placement for this patient at the [place] level of care (*CSW will initial, date and re-position this form in  chart as items are completed):  Yes   Patient/family provided with [place] Work Department's list of facilities offering this level of care within the geographic area requested by the patient (or if unable, by the patient's family).  Yes   Patient/family informed of their freedom to choose among providers that offer the needed level of care, that participate in Medicare, Medicaid or managed care program needed by the patient, have an available bed and are willing to accept the patient.  Yes   Patient/family informed of [facility]'s ownership interest in [facility] and [facility], as well as of the fact that they are under no obligation to receive care at these facilities.  PASRR submitted to EDS on       PASRR number received on       Existing PASRR number confirmed on 02/23/17     FL2 transmitted to all facilities in geographic area requested by pt/family on 02/23/17     FL2 transmitted to all facilities within larger geographic area on       Patient informed that his/her managed care company has contracts with or will negotiate with certain facilities, including the following:            Patient/family informed of bed offers received.  Patient chooses bed at       Physician recommends and patient chooses bed at      Patient to be transferred to   on  .  Patient to be transferred to facility by       Patient family notified on   of transfer.  Name of family member notified:        PHYSICIAN       Additional Comment:    _______________________________________________ Luretha Rued, Marlinda Mike  [phone] 02/23/2017, 9:44 AM

## 2017-02-23 NOTE — Progress Notes (Signed)
Pt having 10/10 pain to Rt hip unrelieved by 10mg  oxycodone and 2mg  IV morphine. Notified on call-physician for [place]. Received a call back from C. Mayo PA-C. New orders to d/c morphine, add dilaudid 1mg  Q2hrs PRN, and increase oxycodone to 5-15mg  Q3hrs PRN.

## 2017-02-23 NOTE — Progress Notes (Signed)
   Subjective: 1 Day Post-Op Procedure(s) (LRB): RIGHT TOTAL HIP ARTHROPLASTY ANTERIOR APPROACH (Right) Patient reports pain as moderate and severe last night.  Better this morning. Patient seen in rounds by Dr. Wynelle Link. Patient is well, but has had some minor complaints of pain in the hip, requiring pain medications We will start therapy today.  Plan is to go Skilled nursing facility after hospital stay.  Objective: Vital signs in last 24 hours: Temp:  [97.4 F (36.3 C)-98.2 F (36.8 C)] 97.5 F (36.4 C) (07/12 0518) Pulse Rate:  [75-96] 84 (07/12 0518) Resp:  [10-18] 16 (07/12 0518) BP: (115-140)/(74-100) 115/74 (07/12 0518) SpO2:  [100 %] 100 % (07/12 0518) Weight:  [54 kg (119 lb)] 54 kg (119 lb) (07/11 0940)  Intake/Output from previous day:  Intake/Output Summary (Last 24 hours) at 02/23/17 0812 Last data filed at 02/23/17 0600  Gross per 24 hour  Intake             3385 ml  Output             2845 ml  Net              540 ml    Intake/Output this shift: No intake/output data recorded.  Labs:  Recent Labs  02/21/17 0846 02/22/17 1525 02/23/17 0504  HGB 13.1 10.2* 9.4*    Recent Labs  02/22/17 1525 02/23/17 0504  WBC 16.6* 11.6*  RBC 3.61* 3.26*  HCT 30.9* 27.9*  PLT 366 305    Recent Labs  02/22/17 1525 02/23/17 0504  NA 137 139  K 5.3* 4.9  CL 106 106  CO2 24 24  BUN 19 15  CREATININE 0.95 0.72  GLUCOSE 155* 153*  CALCIUM 8.5* 9.2    Recent Labs  02/21/17 0846 02/22/17 1525  INR 0.92 1.09    EXAM General - Patient is Alert, Appropriate and Oriented Extremity - Neurovascular intact Sensation intact distally Intact pulses distally Dorsiflexion/Plantar flexion intact Dressing - dressing C/D/I Motor Function - intact, moving foot and toes well on exam.  Hemovac pulled without difficulty.  Past Medical History:  Diagnosis Date  . Adenomatous colon polyp 05/13/2013  . Anemia   . Arthritis   . Chronic pain   . Depression   .  Diabetes mellitus without complication ([place]) 2993   Type II - diet controlled  . Family history of adverse reaction to anesthesia    mother - nausea  . History of anemia   . Hyperlipidemia 2011   Rash with statin:  not well controlled with Gemfibrozil  . Hypertension 2011  . Insomnia secondary to chronic pain   . Low back pain with left-sided sciatica    Reportedly with lumbar spinal stenosis    Assessment/Plan: 1 Day Post-Op Procedure(s) (LRB): RIGHT TOTAL HIP ARTHROPLASTY ANTERIOR APPROACH (Right) Principal Problem:   Avascular necrosis of femoral head, right (HCC) Active Problems:   OA (osteoarthritis) of hip  Estimated body mass index is 20.43 kg/m as calculated from the following:   Height as of this encounter: 5\' 4"  (1.626 m).   Weight as of this encounter: 54 kg (119 lb). Advance diet Up with therapy Plan for discharge tomorrow Discharge to SNF  DVT Prophylaxis - Xarelto Weight Bearing As Tolerated right Leg Hemovac Pulled Begin Therapy Add Iron  Arlee Muslim, PA-C Orthopaedic Surgery 02/23/2017, 8:12 AM

## 2017-02-23 NOTE — NC FL2 (Signed)
[place] LEVEL OF CARE SCREENING TOOL     IDENTIFICATION  Patient Name: Roberta Clark Birthdate: May 10, 1961 Sex: female Admission Date (Current Location): 02/22/2017  [facility] and [place] Number:  Herbalist and Address:  [facility],  [place] [address], [place]      Provider Number: [id number]  Attending Physician Name and Address:  Gaynelle Arabian, MD  Relative Name and Phone Number:       Current Level of Care: Hospital Recommended Level of Care: [place] Prior Approval Number:    Date Approved/Denied:   PASRR Number: [id number]  Discharge Plan: SNF    Current Diagnoses: Patient Active Problem List   Diagnosis Date Noted  . Avascular necrosis of femoral head, right ([place]) 02/22/2017  . Avascular necrosis of femoral head, left ([place]) 10/24/2016  . OA (osteoarthritis) of hip 10/24/2016  . S/P lumbar laminectomy 05/27/2016  . Type 2 diabetes mellitus without complication, without long-term current use of insulin ([place]) 11/03/2015  . Lumbar radiculopathy, chronic 06/14/2013  . Adenomatous colon polyp 05/13/2013  . Vaginal discharge 08/22/2012  . Hypertension 07/25/2012  . Hyperlipidemia with target LDL less than 100 07/25/2012  . Preventative health care 07/25/2012  . Hot flash, menopausal 07/25/2012  . Chronic low back pain 07/25/2012  . Fibroids 09/02/2011  . Tobacco dependence 09/02/2011  . Hyperlipidemia 08/15/2009    Orientation RESPIRATION BLADDER Height & Weight     Self, Time, Situation, Place  Normal Continent Weight: 119 lb (54 kg) Height:  5\' 4"  (162.6 cm)  BEHAVIORAL SYMPTOMS/MOOD NEUROLOGICAL BOWEL NUTRITION STATUS  Other (Comment) (no behaviors)     Diet  AMBULATORY STATUS COMMUNICATION OF NEEDS Skin   Limited Assist Verbally Surgical wounds                       Personal Care Assistance Level of Assistance  Bathing, Feeding, Dressing Bathing Assistance: Limited assistance Feeding  assistance: Independent Dressing Assistance: Limited assistance     Functional Limitations Info  Sight, Hearing, Speech Sight Info: Adequate Hearing Info: Adequate Speech Info: Adequate    SPECIAL CARE FACTORS FREQUENCY  PT (By licensed PT), OT (By licensed OT)     PT Frequency: 5x wk OT Frequency: 5x wk            Contractures Contractures Info: Not present    Additional Factors Info  Code Status, Allergies   Allergies Info: METFORMIN AND RELATED, LIPITOR ATORVASTATIN           Current Medications (02/23/2017):  This is the current hospital active medication list Current Facility-Administered Medications  Medication Dose Route Frequency Provider Last Rate Last Dose  . 0.9 %  sodium chloride infusion   Intravenous Continuous Perkins, Alexzandrew L, PA-C 75 mL/hr at 02/23/17 0824    . acetaminophen (TYLENOL) tablet 650 mg  650 mg Oral Q6H PRN [name], Pilar Plate, MD       Or  . acetaminophen (TYLENOL) suppository 650 mg  650 mg Rectal Q6H PRN [name], Pilar Plate, MD      . amitriptyline (ELAVIL) tablet 25 mg  25 mg Oral QHS Gaynelle Arabian, MD   25 mg at 02/22/17 2128  . bisacodyl (DULCOLAX) suppository 10 mg  10 mg Rectal Daily PRN Gaynelle Arabian, MD      . dexamethasone (DECADRON) injection 10 mg  10 mg Intravenous Once [name], Pilar Plate, MD      . diphenhydrAMINE (BENADRYL) 12.5 MG/5ML elixir 12.5-25 mg  12.5-25 mg  Oral Q4H PRN Gaynelle Arabian, MD      . docusate sodium (COLACE) capsule 100 mg  100 mg Oral BID Gaynelle Arabian, MD   100 mg at 02/22/17 2128  . FLUoxetine (PROZAC) capsule 20 mg  20 mg Oral Daily [name], Pilar Plate, MD      . gabapentin (NEURONTIN) capsule 600 mg  600 mg Oral BID Gaynelle Arabian, MD   600 mg at 02/22/17 2128  . gemfibrozil (LOPID) tablet 600 mg  600 mg Oral BID WC Gaynelle Arabian, MD   600 mg at 02/23/17 0823  . HYDROmorphone (DILAUDID) injection 1 mg  1 mg Intravenous Q2H PRN Mayo, Darla Lesches, PA-C   1 mg at 02/23/17 0248  . iron polysaccharides  (NIFEREX) capsule 150 mg  150 mg Oral Daily Perkins, Alexzandrew L, PA-C      . menthol-cetylpyridinium (CEPACOL) lozenge 3 mg  1 lozenge Oral PRN [name], Pilar Plate, MD       Or  . phenol (CHLORASEPTIC) mouth spray 1 spray  1 spray Mouth/Throat PRN [name], Pilar Plate, MD      . methocarbamol (ROBAXIN) tablet 500 mg  500 mg Oral Q6H PRN Gaynelle Arabian, MD   500 mg at 02/23/17 0246   Or  . methocarbamol (ROBAXIN) 500 mg in dextrose 5 % 50 mL IVPB  500 mg Intravenous Q6H PRN Gaynelle Arabian, MD 110 mL/hr at 02/22/17 1347 500 mg at 02/22/17 1347  . metoCLOPramide (REGLAN) tablet 5-10 mg  5-10 mg Oral Q8H PRN Gaynelle Arabian, MD       Or  . metoCLOPramide (REGLAN) injection 5-10 mg  5-10 mg Intravenous Q8H PRN [name], Pilar Plate, MD      . ondansetron (ZOFRAN) tablet 4 mg  4 mg Oral Q6H PRN Gaynelle Arabian, MD       Or  . ondansetron (ZOFRAN) injection 4 mg  4 mg Intravenous Q6H PRN [name], Pilar Plate, MD      . oxyCODONE (Oxy IR/ROXICODONE) immediate release tablet 5-15 mg  5-15 mg Oral Q3H PRN MayoDarla Lesches, PA-C   15 mg at 02/23/17 2878  . polyethylene glycol (MIRALAX / GLYCOLAX) packet 17 g  17 g Oral Daily PRN [name], Pilar Plate, MD      . potassium chloride (K-DUR,KLOR-CON) CR tablet 10 mEq  10 mEq Oral Daily [name], Pilar Plate, MD      . rivaroxaban Alveda Reasons) tablet 10 mg  10 mg Oral Q breakfast Gaynelle Arabian, MD   10 mg at 02/23/17 0823  . sodium phosphate (FLEET) 7-19 GM/118ML enema 1 enema  1 enema Rectal Once PRN [name], Pilar Plate, MD      . tiZANidine (ZANAFLEX) tablet 4 mg  4 mg Oral Q8H PRN Gaynelle Arabian, MD         Discharge Medications: Please see discharge summary for a list of discharge medications.  Relevant Imaging Results:  Relevant Lab Results:   Additional Information SS # [id number]. 10.10.17 Surgical pcr +MRSA  Masiya Claassen, Randall An, LCSW

## 2017-02-24 ENCOUNTER — Encounter (HOSPITAL_COMMUNITY): Payer: Medicaid Other

## 2017-02-24 LAB — CBC
HCT: 26 % — ABNORMAL LOW (ref 36.0–46.0)
Hemoglobin: 8.5 g/dL — ABNORMAL LOW (ref 12.0–15.0)
MCH: 27.3 pg (ref 26.0–34.0)
MCHC: 32.7 g/dL (ref 30.0–36.0)
MCV: 83.6 fL (ref 78.0–100.0)
PLATELETS: 291 10*3/uL (ref 150–400)
RBC: 3.11 MIL/uL — ABNORMAL LOW (ref 3.87–5.11)
RDW: 14.6 % (ref 11.5–15.5)
WBC: 13.3 10*3/uL — AB (ref 4.0–10.5)

## 2017-02-24 LAB — BASIC METABOLIC PANEL
Anion gap: 8 (ref 5–15)
BUN: 19 mg/dL (ref 6–20)
CALCIUM: 9.3 mg/dL (ref 8.9–10.3)
CO2: 26 mmol/L (ref 22–32)
Chloride: 103 mmol/L (ref 101–111)
Creatinine, Ser: 0.64 mg/dL (ref 0.44–1.00)
GFR calc Af Amer: >60 mL/min (ref >60)
GLUCOSE: 130 mg/dL — AB (ref 65–99)
Potassium: 4 mmol/L (ref 3.5–5.1)
Sodium: 137 mmol/L (ref 135–145)

## 2017-02-24 MED ORDER — BISACODYL 10 MG RE SUPP: 10.0000 mg | Freq: Every day | RECTAL | 0 refills | Status: DC | PRN

## 2017-02-24 MED ORDER — DOCUSATE SODIUM 100 MG PO CAPS: 100.0000 mg | ORAL_CAPSULE | Freq: Two times a day (BID) | ORAL | 0 refills | Status: DC

## 2017-02-24 MED ORDER — POLYETHYLENE GLYCOL 3350 17 G PO PACK: 17.0000 g | PACK | Freq: Every day | ORAL | 0 refills | Status: DC | PRN

## 2017-02-24 MED ORDER — OXYCODONE HCL 5 MG PO TABS: 5.0000 mg | ORAL_TABLET | ORAL | 0 refills | Status: DC | PRN

## 2017-02-24 MED ORDER — FLEET ENEMA 7-19 GM/118ML RE ENEM: 1.0000 | ENEMA | Freq: Once | RECTAL | 0 refills | Status: DC | PRN

## 2017-02-24 MED ORDER — RIVAROXABAN 10 MG PO TABS: 10.0000 mg | ORAL_TABLET | Freq: Every day | ORAL | 0 refills | Status: DC

## 2017-02-24 NOTE — Clinical Social Work Placement (Signed)
   CLINICAL SOCIAL WORK PLACEMENT  NOTE  Date:  02/24/2017  Patient Details  Name: Roberta Clark MRN: [id number] Date of Birth: 02/29/1961  Clinical Social Work is seeking post-discharge placement for this patient at the [place] level of care (*CSW will initial, date and re-position this form in  chart as items are completed):  Yes   Patient/family provided with [facility] Work Department's list of facilities offering this level of care within the geographic area requested by the patient (or if unable, by the patient's family).  Yes   Patient/family informed of their freedom to choose among providers that offer the needed level of care, that participate in Medicare, Medicaid or managed care program needed by the patient, have an available bed and are willing to accept the patient.  Yes   Patient/family informed of [facility]'s ownership interest in [facility] and [facility], as well as of the fact that they are under no obligation to receive care at these facilities.  PASRR submitted to EDS on       PASRR number received on       Existing PASRR number confirmed on 02/23/17     FL2 transmitted to all facilities in geographic area requested by pt/family on 02/23/17     FL2 transmitted to all facilities within larger geographic area on       Patient informed that his/her managed care company has contracts with or will negotiate with certain facilities, including the following:        Yes   Patient/family informed of bed offers received.  Patient chooses bed at [facility]     Physician recommends and patient chooses bed at      Patient to be transferred to [facility] on 02/24/17.  Patient to be transferred to facility by [facility]     Patient family notified on 02/24/17 of transfer.  Name of family member notified:  Pt to call family directly.     PHYSICIAN       Additional Comment: Pt is in agreement with dc to [facility] today. Pt  requesting family to transport. DC Summary sent to SNF for review. Scripts included in [employer]. # for report provided to nsg. DC packet provided to pt.   _______________________________________________ Luretha Rued, [place] [phone] 02/24/2017, 2:04 PM

## 2017-02-24 NOTE — Discharge Summary (Signed)
Physician Discharge Summary   Patient ID: Roberta Clark MRN: [id number] DOB/AGE: 1961-03-26 56 y.o.  Admit date: 02/22/2017 Discharge date: 02/24/2017  Primary Diagnosis:  Osteonecrosis of the Right hip.   Admission Diagnoses:  Past Medical History:  Diagnosis Date  . Adenomatous colon polyp 05/13/2013  . Anemia   . Arthritis   . Chronic pain   . Depression   . Diabetes mellitus without complication ([place]) 6073   Type II - diet controlled  . Family history of adverse reaction to anesthesia    mother - nausea  . History of anemia   . Hyperlipidemia 2011   Rash with statin:  not well controlled with Gemfibrozil  . Hypertension 2011  . Insomnia secondary to chronic pain   . Low back pain with left-sided sciatica    Reportedly with lumbar spinal stenosis   Discharge Diagnoses:   Principal Problem:   Avascular necrosis of femoral head, right (HCC) Active Problems:   OA (osteoarthritis) of hip  Estimated body mass index is 20.43 kg/m as calculated from the following:   Height as of this encounter: '5\' 4"'  (1.626 m).   Weight as of this encounter: 54 kg (119 lb).  Procedure(s) (LRB): RIGHT TOTAL HIP ARTHROPLASTY ANTERIOR APPROACH (Right)   Consults: None  HPI: Roberta Clark is a 56 y.o. female who has advanced end-  stage AVN of their Right  hip with progressively worsening pain and  dysfunction.The patient has failed nonoperative management and presents for  total hip arthroplasty.   Laboratory Data: Admission on 02/22/2017  Component Date Value Ref Range Status  . aPTT 02/22/2017 34  24 - 36 seconds Final  . WBC 02/22/2017 16.6* 4.0 - 10.5 K/uL Final  . RBC 02/22/2017 3.61* 3.87 - 5.11 MIL/uL Final  . Hemoglobin 02/22/2017 10.2* 12.0 - 15.0 g/dL Final   Comment: DELTA CHECK NOTED REPEATED TO VERIFY   . HCT 02/22/2017 30.9* 36.0 - 46.0 % Final  . MCV 02/22/2017 85.6  78.0 - 100.0 fL Final  . MCH 02/22/2017 28.3  26.0 - 34.0 pg Final  . MCHC 02/22/2017 33.0  30.0 -  36.0 g/dL Final  . RDW 02/22/2017 14.8  11.5 - 15.5 % Final  . Platelets 02/22/2017 366  150 - 400 K/uL Final  . Sodium 02/22/2017 137  135 - 145 mmol/L Final  . Potassium 02/22/2017 5.3* 3.5 - 5.1 mmol/L Final   Comment: DELTA CHECK NOTED MODERATE HEMOLYSIS   . Chloride 02/22/2017 106  101 - 111 mmol/L Final  . CO2 02/22/2017 24  22 - 32 mmol/L Final  . Glucose, Bld 02/22/2017 155* 65 - 99 mg/dL Final  . BUN 02/22/2017 19  6 - 20 mg/dL Final  . Creatinine, Ser 02/22/2017 0.95  0.44 - 1.00 mg/dL Final  . Calcium 02/22/2017 8.5* 8.9 - 10.3 mg/dL Final  . Total Protein 02/22/2017 6.7  6.5 - 8.1 g/dL Final  . Albumin 02/22/2017 3.8  3.5 - 5.0 g/dL Final  . AST 02/22/2017 36  15 - 41 U/L Final  . ALT 02/22/2017 15  14 - 54 U/L Final  . Alkaline Phosphatase 02/22/2017 75  38 - 126 U/L Final  . Total Bilirubin 02/22/2017 1.2  0.3 - 1.2 mg/dL Final  . GFR calc non Af Amer 02/22/2017 >60  >60 mL/min Final  . GFR calc Af Amer 02/22/2017 >60  >60 mL/min Final   Comment: (NOTE) The eGFR has been calculated using the CKD EPI equation. This calculation has not been  validated in all clinical situations. eGFR's persistently <60 mL/min signify possible Chronic Kidney Disease.   . Anion gap 02/22/2017 7  5 - 15 Final  . Prothrombin Time 02/22/2017 14.1  11.4 - 15.2 seconds Final  . INR 02/22/2017 1.09   Final  . Glucose-Capillary 02/22/2017 119* 65 - 99 mg/dL Final  . Comment 1 02/22/2017 Notify RN   Final  . Glucose-Capillary 02/22/2017 125* 65 - 99 mg/dL Final  . Comment 1 02/22/2017 Notify RN   Final  . Comment 2 02/22/2017 Document in Chart   Final  . WBC 02/23/2017 11.6* 4.0 - 10.5 K/uL Final  . RBC 02/23/2017 3.26* 3.87 - 5.11 MIL/uL Final  . Hemoglobin 02/23/2017 9.4* 12.0 - 15.0 g/dL Final  . HCT 02/23/2017 27.9* 36.0 - 46.0 % Final  . MCV 02/23/2017 85.6  78.0 - 100.0 fL Final  . MCH 02/23/2017 28.8  26.0 - 34.0 pg Final  . MCHC 02/23/2017 33.7  30.0 - 36.0 g/dL Final  . RDW  02/23/2017 14.5  11.5 - 15.5 % Final  . Platelets 02/23/2017 305  150 - 400 K/uL Final  . Sodium 02/23/2017 139  135 - 145 mmol/L Final  . Potassium 02/23/2017 4.9  3.5 - 5.1 mmol/L Final  . Chloride 02/23/2017 106  101 - 111 mmol/L Final  . CO2 02/23/2017 24  22 - 32 mmol/L Final  . Glucose, Bld 02/23/2017 153* 65 - 99 mg/dL Final  . BUN 02/23/2017 15  6 - 20 mg/dL Final  . Creatinine, Ser 02/23/2017 0.72  0.44 - 1.00 mg/dL Final  . Calcium 02/23/2017 9.2  8.9 - 10.3 mg/dL Final  . GFR calc non Af Amer 02/23/2017 >60  >60 mL/min Final  . GFR calc Af Amer 02/23/2017 >60  >60 mL/min Final   Comment: (NOTE) The eGFR has been calculated using the CKD EPI equation. This calculation has not been validated in all clinical situations. eGFR's persistently <60 mL/min signify possible Chronic Kidney Disease.   . Anion gap 02/23/2017 9  5 - 15 Final  . WBC 02/24/2017 13.3* 4.0 - 10.5 K/uL Final  . RBC 02/24/2017 3.11* 3.87 - 5.11 MIL/uL Final  . Hemoglobin 02/24/2017 8.5* 12.0 - 15.0 g/dL Final  . HCT 02/24/2017 26.0* 36.0 - 46.0 % Final  . MCV 02/24/2017 83.6  78.0 - 100.0 fL Final  . MCH 02/24/2017 27.3  26.0 - 34.0 pg Final  . MCHC 02/24/2017 32.7  30.0 - 36.0 g/dL Final  . RDW 02/24/2017 14.6  11.5 - 15.5 % Final  . Platelets 02/24/2017 291  150 - 400 K/uL Final  . Sodium 02/24/2017 137  135 - 145 mmol/L Final  . Potassium 02/24/2017 4.0  3.5 - 5.1 mmol/L Final   Comment: DELTA CHECK NOTED REPEATED TO VERIFY   . Chloride 02/24/2017 103  101 - 111 mmol/L Final  . CO2 02/24/2017 26  22 - 32 mmol/L Final  . Glucose, Bld 02/24/2017 130* 65 - 99 mg/dL Final  . BUN 02/24/2017 19  6 - 20 mg/dL Final  . Creatinine, Ser 02/24/2017 0.64  0.44 - 1.00 mg/dL Final  . Calcium 02/24/2017 9.3  8.9 - 10.3 mg/dL Final  . GFR calc non Af Amer 02/24/2017 >60  >60 mL/min Final  . GFR calc Af Amer 02/24/2017 >60  >60 mL/min Final   Comment: (NOTE) The eGFR has been calculated using the CKD EPI  equation. This calculation has not been validated in all clinical situations. eGFR's persistently <60 mL/min  signify possible Chronic Kidney Disease.   Georgiann Hahn gap 02/24/2017 8  5 - 15 Final  Hospital Outpatient Visit on 02/21/2017  Component Date Value Ref Range Status  . aPTT 02/21/2017 33  24 - 36 seconds Final  . WBC 02/21/2017 7.5  4.0 - 10.5 K/uL Final  . RBC 02/21/2017 4.73  3.87 - 5.11 MIL/uL Final  . Hemoglobin 02/21/2017 13.1  12.0 - 15.0 g/dL Final  . HCT 02/21/2017 39.4  36.0 - 46.0 % Final  . MCV 02/21/2017 83.3  78.0 - 100.0 fL Final  . MCH 02/21/2017 27.7  26.0 - 34.0 pg Final  . MCHC 02/21/2017 33.2  30.0 - 36.0 g/dL Final  . RDW 02/21/2017 14.4  11.5 - 15.5 % Final  . Platelets 02/21/2017 393  150 - 400 K/uL Final  . Sodium 02/21/2017 139  135 - 145 mmol/L Final  . Potassium 02/21/2017 4.1  3.5 - 5.1 mmol/L Final  . Chloride 02/21/2017 105  101 - 111 mmol/L Final  . CO2 02/21/2017 24  22 - 32 mmol/L Final  . Glucose, Bld 02/21/2017 134* 65 - 99 mg/dL Final  . BUN 02/21/2017 25* 6 - 20 mg/dL Final  . Creatinine, Ser 02/21/2017 0.93  0.44 - 1.00 mg/dL Final  . Calcium 02/21/2017 10.0  8.9 - 10.3 mg/dL Final  . Total Protein 02/21/2017 8.7* 6.5 - 8.1 g/dL Final  . Albumin 02/21/2017 4.9  3.5 - 5.0 g/dL Final  . AST 02/21/2017 20  15 - 41 U/L Final  . ALT 02/21/2017 16  14 - 54 U/L Final  . Alkaline Phosphatase 02/21/2017 100  38 - 126 U/L Final  . Total Bilirubin 02/21/2017 0.3  0.3 - 1.2 mg/dL Final  . GFR calc non Af Amer 02/21/2017 >60  >60 mL/min Final  . GFR calc Af Amer 02/21/2017 >60  >60 mL/min Final   Comment: (NOTE) The eGFR has been calculated using the CKD EPI equation. This calculation has not been validated in all clinical situations. eGFR's persistently <60 mL/min signify possible Chronic Kidney Disease.   . Anion gap 02/21/2017 10  5 - 15 Final  . Prothrombin Time 02/21/2017 12.3  11.4 - 15.2 seconds Final  . INR 02/21/2017 0.92   Final  .  ABO/RH(D) 02/21/2017 A POS   Final  . Antibody Screen 02/21/2017 NEG   Final  . Sample Expiration 02/21/2017 02/25/2017   Final  . Extend sample reason 02/21/2017 NO TRANSFUSIONS OR PREGNANCY IN THE PAST 3 MONTHS   Final  . Hgb A1c MFr Bld 02/21/2017 7.0* 4.8 - 5.6 % Final   Comment: (NOTE)         Pre-diabetes: 5.7 - 6.4         Diabetes: >6.4         Glycemic control for adults with diabetes: <7.0   . Mean Plasma Glucose 02/21/2017 154  mg/dL Final   Comment: (NOTE) Performed At: [facility] [place], [place] [id number] Lindon Romp MD [id number]   . MRSA, PCR 02/21/2017 NEGATIVE  NEGATIVE Final  . Staphylococcus aureus 02/21/2017 NEGATIVE  NEGATIVE Final   Comment:        The Xpert SA Assay (FDA approved for NASAL specimens in patients over 35 years of age), is one component of a comprehensive surveillance program.  Test performance has been validated by [facility] for patients greater than or equal to 5 year old. It is not intended to diagnose infection nor to guide or  monitor treatment.   Lab on 01/31/2017  Component Date Value Ref Range Status  . Color, UA 01/31/2017 yellow   Final  . Clarity, UA 01/31/2017 clear   Final  . Glucose, UA 01/31/2017 neg   Final  . Bilirubin, UA 01/31/2017 neg   Final  . Ketones, UA 01/31/2017 neg   Final  . Spec Grav, UA 01/31/2017 1.010  1.010 - 1.025 Final  . Blood, UA 01/31/2017 neg   Final  . pH, UA 01/31/2017 6.0  5.0 - 8.0 Final  . Protein, UA 01/31/2017 neg   Final  . Urobilinogen, UA 01/31/2017 0.2  0.2 or 1.0 E.U./dL Final  . Nitrite, UA 01/31/2017 neg   Final  . Leukocytes, UA 01/31/2017 Negative  Negative Final     X-Rays:Dg Pelvis Portable  Result Date: 02/22/2017 CLINICAL DATA:  Postop right total hip replacement EXAM: PORTABLE PELVIS 1-2 VIEWS COMPARISON:  10/24/2016 FINDINGS: Changes of remote left hip replacement. Interval right hip replacement. Soft tissue drain in place. Normal AP  alignment. No hardware complicating feature. IMPRESSION: Right hip replacement.  No complicating feature. Electronically Signed   By: Rolm Baptise M.D.   On: 02/22/2017 14:06   Dg C-arm 1-60 Min-no Report  Result Date: 02/22/2017 Fluoroscopy was utilized by the requesting physician.  No radiographic interpretation.    EKG: Orders placed or performed during the hospital encounter of 10/21/16  . EKG 12 lead  . EKG 12 lead     Hospital Course: Patient was admitted to [facility] and taken to the OR and underwent the above state procedure without complications.  Patient tolerated the procedure well and was later transferred to the recovery room and then to the orthopaedic floor for postoperative care.  They were given PO and IV analgesics for pain control following their surgery.  They were given 24 hours of postoperative antibiotics of  Anti-infectives    Start     Dose/Rate Route Frequency Ordered Stop   02/22/17 0915  ceFAZolin (ANCEF) IVPB 2g/100 mL premix     2 g 200 mL/hr over 30 Minutes Intravenous On call to O.R. 02/22/17 0905 02/22/17 1209   02/22/17 0910  ceFAZolin (ANCEF) 2-4 GM/100ML-% IVPB    Comments:  Waldron Session   : cabinet override      02/22/17 0910 02/22/17 1154     and started on DVT prophylaxis in the form of Xarelto.   PT and OT were ordered for total hip protocol.  The patient was allowed to be WBAT with therapy. Discharge planning was consulted to help with postop disposition and equipment needs.  Patient had a tough night on the evening of surgery.  They started to get up OOB with therapy on day one.  Hemovac drain was pulled without difficulty.  Continued to work with therapy into day two.  Dressing was changed on day two and the incision was healing well. Patient was seen in rounds and was ready to go to [facility].  Discharge to SNF Diet - Cardiac diet and Diabetic diet Follow up - in 2 weeks Activity - WBAT Disposition - Home Condition Upon  Discharge - Stable D/C Meds - See DC Summary DVT Prophylaxis - Xarelto  Discharge Instructions    Call MD / Call 911    Complete by:  As directed    If you experience chest pain or shortness of breath, CALL 911 and be transported to the hospital emergency room.  If you develope a fever above 101 F,  pus (white drainage) or increased drainage or redness at the wound, or calf pain, call your surgeon's office.   Change dressing    Complete by:  As directed    You may change your dressing dressing daily with sterile 4 x 4 inch gauze dressing and paper tape.  Do not submerge the incision under water.   Constipation Prevention    Complete by:  As directed    Drink plenty of fluids.  Prune juice may be helpful.  You may use a stool softener, such as Colace (over the counter) 100 mg twice a day.  Use MiraLax (over the counter) for constipation as needed.   Diet - low sodium heart healthy    Complete by:  As directed    Diet Carb Modified    Complete by:  As directed    Discharge instructions    Complete by:  As directed    Take Xarelto for two and a half more weeks, then discontinue Xarelto. Once the patient has completed the blood thinner regimen, then take a Baby 81 mg Aspirin daily for three more weeks.   Pick up stool softner and laxative for home use following surgery while on pain medications. Do not submerge incision under water. Please use good hand washing techniques while changing dressing each day. May shower starting three days after surgery. Please use a clean towel to pat the incision dry following showers. Continue to use ice for pain and swelling after surgery. Do not use any lotions or creams on the incision until instructed by your surgeon.  Wear both TED hose on both legs during the day every day for three weeks, but may remove the TED hose at night at home.  Postoperative Constipation Protocol  Constipation - defined medically as fewer than three stools per week and  severe constipation as less than one stool per week.  One of the most common issues patients have following surgery is constipation.  Even if you have a regular bowel pattern at home, your normal regimen is likely to be disrupted due to multiple reasons following surgery.  Combination of anesthesia, postoperative narcotics, change in appetite and fluid intake all can affect your bowels.  In order to avoid complications following surgery, here are some recommendations in order to help you during your recovery period.  Colace (docusate) - Pick up an over-the-counter form of Colace or another stool softener and take twice a day as long as you are requiring postoperative pain medications.  Take with a full glass of water daily.  If you experience loose stools or diarrhea, hold the colace until you stool forms back up.  If your symptoms do not get better within 1 week or if they get worse, check with your doctor.  Dulcolax (bisacodyl) - Pick up over-the-counter and take as directed by the product packaging as needed to assist with the movement of your bowels.  Take with a full glass of water.  Use this product as needed if not relieved by Colace only.   MiraLax (polyethylene glycol) - Pick up over-the-counter to have on hand.  MiraLax is a solution that will increase the amount of water in your bowels to assist with bowel movements.  Take as directed and can mix with a glass of water, juice, soda, coffee, or tea.  Take if you go more than two days without a movement. Do not use MiraLax more than once per day. Call your doctor if you are still constipated or irregular after  using this medication for 7 days in a row.  If you continue to have problems with postoperative constipation, please contact the office for further assistance and recommendations.  If you experience "the worst abdominal pain ever" or develop nausea or vomiting, please contact the office immediatly for further recommendations for treatment.    Do not sit on low chairs, stoools or toilet seats, as it may be difficult to get up from low surfaces    Complete by:  As directed    Driving restrictions    Complete by:  As directed    No driving until released by the physician.   Increase activity slowly as tolerated    Complete by:  As directed    Lifting restrictions    Complete by:  As directed    No lifting until released by the physician.   Patient may shower    Complete by:  As directed    You may shower without a dressing once there is no drainage.  Do not wash over the wound.  If drainage remains, do not shower until drainage stops.   TED hose    Complete by:  As directed    Use stockings (TED hose) for 3 weeks on both leg(s).  You may remove them at night for sleeping.   Weight bearing as tolerated    Complete by:  As directed    Laterality:  right   Extremity:  Lower     Allergies as of 02/24/2017      Reactions   Metformin And Related Nausea And Vomiting   Lipitor [atorvastatin] Rash      Medication List    STOP taking these medications   ALEVE PM PO   ciprofloxacin 500 MG tablet Commonly known as:  CIPRO   cyclobenzaprine 10 MG tablet Commonly known as:  FLEXERIL   ibuprofen 800 MG tablet Commonly known as:  ADVIL,MOTRIN   multivitamin with minerals Tabs tablet   oxyCODONE-acetaminophen 7.5-325 MG tablet Commonly known as:  PERCOCET     TAKE these medications   acetaminophen 325 MG tablet Commonly known as:  TYLENOL Take 2 tablets (650 mg total) by mouth every 6 (six) hours as needed for mild pain (or Fever >/= 101).   amitriptyline 25 MG tablet Commonly known as:  ELAVIL TAKE ONE TABLET BY MOUTH AT BEDTIME AS NEEDED FOR SLEEP What changed:  See the new instructions.   bisacodyl 10 MG suppository Commonly known as:  DULCOLAX Place 1 suppository (10 mg total) rectally daily as needed for moderate constipation.   docusate sodium 100 MG capsule Commonly known as:  COLACE Take 1 capsule  (100 mg total) by mouth 2 (two) times daily.   FLUoxetine 20 MG capsule Commonly known as:  PROZAC TAKE ONE CAPSULE BY MOUTH ONCE DAILY   gabapentin 300 MG capsule Commonly known as:  NEURONTIN Take 1 capsule (300 mg total) by mouth 3 (three) times daily. Take two capsules in the AM, two at lunch, and 4 at bedtime. What changed:  how much to take  when to take this  additional instructions   gemfibrozil 600 MG tablet Commonly known as:  LOPID TAKE ONE TABLET BY MOUTH TWICE DAILY WITH MEALS   iron polysaccharides 150 MG capsule Commonly known as:  NIFEREX Take 1 capsule (150 mg total) by mouth daily.   KLOR-CON M10 10 MEQ tablet Generic drug:  potassium chloride TAKE ONE TABLET BY MOUTH ONCE DAILY   lisinopril-hydrochlorothiazide 20-12.5 MG tablet Commonly known as:  PRINZIDE,ZESTORETIC TAKE ONE TABLET BY MOUTH ONCE DAILY   Melatonin 3 MG Subl Place 6 mg under the tongue at bedtime.   oxyCODONE 5 MG immediate release tablet Commonly known as:  Oxy IR/ROXICODONE Take 1-3 tablets (5-15 mg total) by mouth every 4 (four) hours as needed for moderate pain or severe pain.   polyethylene glycol packet Commonly known as:  MIRALAX / GLYCOLAX Take 17 g by mouth daily as needed for mild constipation.   rivaroxaban 10 MG Tabs tablet Commonly known as:  XARELTO Take 1 tablet (10 mg total) by mouth daily with breakfast. Take Xarelto for two and a half more weeks following discharge from the hospital, then discontinue Xarelto. Once the patient has completed the blood thinner regimen, then take a Baby 81 mg Aspirin daily for three more weeks.   sodium phosphate 7-19 GM/118ML Enem Place 133 mLs (1 enema total) rectally once as needed for severe constipation.   tiZANidine 4 MG tablet Commonly known as:  ZANAFLEX Take 4 mg by mouth every 8 (eight) hours as needed for muscle spasms.      Follow-up Information    Gaynelle Arabian, MD. Schedule an appointment as soon as possible for  a visit on 03/07/2017.   Specialty:  Orthopedic Surgery Contact information: [address] [place] [address] [phone]           Signed: Arlee Muslim, PA-C Orthopaedic Surgery 02/24/2017, 9:22 AM

## 2017-02-24 NOTE — Progress Notes (Signed)
   Subjective: 2 Days Post-Op Procedure(s) (LRB): RIGHT TOTAL HIP ARTHROPLASTY ANTERIOR APPROACH (Right) Patient reports pain as mild.   Patient seen in rounds for Dr. Wynelle Link. Patient is well, but has had some minor complaints of pain in the hip, requiring pain medications Patient is ready to go to [facility]  Objective: Vital signs in last 24 hours: Temp:  [97.5 F (36.4 C)-98.6 F (37 C)] 98.2 F (36.8 C) (07/13 0520) Pulse Rate:  [88-94] 89 (07/13 0520) Resp:  [16-17] 17 (07/13 0520) BP: (104-122)/(64-78) 122/64 (07/13 0520) SpO2:  [98 %-100 %] 99 % (07/13 0520)  Intake/Output from previous day:  Intake/Output Summary (Last 24 hours) at 02/24/17 0916 Last data filed at 02/24/17 1660  Gross per 24 hour  Intake          1178.17 ml  Output             4300 ml  Net         -3121.83 ml    Intake/Output this shift: Total I/O In: -  Out: 650 [Urine:650]  Labs:  Recent Labs  02/22/17 1525 02/23/17 0504 02/24/17 0527  HGB 10.2* 9.4* 8.5*    Recent Labs  02/23/17 0504 02/24/17 0527  WBC 11.6* 13.3*  RBC 3.26* 3.11*  HCT 27.9* 26.0*  PLT 305 291    Recent Labs  02/23/17 0504 02/24/17 0527  NA 139 137  K 4.9 4.0  CL 106 103  CO2 24 26  BUN 15 19  CREATININE 0.72 0.64  GLUCOSE 153* 130*  CALCIUM 9.2 9.3    Recent Labs  02/22/17 1525  INR 1.09    EXAM: General - Patient is Alert, Appropriate and Oriented Extremity - Neurovascular intact Sensation intact distally Intact pulses distally Dorsiflexion/Plantar flexion intact Incision - clean, dry, no drainage Motor Function - intact, moving foot and toes well on exam.   Assessment/Plan: 2 Days Post-Op Procedure(s) (LRB): RIGHT TOTAL HIP ARTHROPLASTY ANTERIOR APPROACH (Right) Procedure(s) (LRB): RIGHT TOTAL HIP ARTHROPLASTY ANTERIOR APPROACH (Right) Past Medical History:  Diagnosis Date  . Adenomatous colon polyp 05/13/2013  . Anemia   . Arthritis   . Chronic pain   . Depression   .  Diabetes mellitus without complication ([place]) 6301   Type II - diet controlled  . Family history of adverse reaction to anesthesia    mother - nausea  . History of anemia   . Hyperlipidemia 2011   Rash with statin:  not well controlled with Gemfibrozil  . Hypertension 2011  . Insomnia secondary to chronic pain   . Low back pain with left-sided sciatica    Reportedly with lumbar spinal stenosis   Principal Problem:   Avascular necrosis of femoral head, right (HCC) Active Problems:   OA (osteoarthritis) of hip  Estimated body mass index is 20.43 kg/m as calculated from the following:   Height as of this encounter: 5\' 4"  (1.626 m).   Weight as of this encounter: 54 kg (119 lb). Up with therapy Discharge to SNF Diet - Cardiac diet and Diabetic diet Follow up - in 2 weeks Activity - WBAT Disposition - Home Condition Upon Discharge - Stable D/C Meds - See DC Summary DVT Prophylaxis - Xarelto  Arlee Muslim, PA-C Orthopaedic Surgery 02/24/2017, 9:16 AM

## 2017-04-04 ENCOUNTER — Ambulatory Visit (INDEPENDENT_AMBULATORY_CARE_PROVIDER_SITE_OTHER): Payer: Medicaid Other | Admitting: Internal Medicine

## 2017-04-04 ENCOUNTER — Encounter: Payer: Self-pay | Admitting: Internal Medicine

## 2017-04-04 VITALS — BP 118/80 | HR 70 | Resp 12 | Ht 62.0 in | Wt 130.0 lb

## 2017-04-04 DIAGNOSIS — F17200 Nicotine dependence, unspecified, uncomplicated: Secondary | ICD-10-CM | POA: Diagnosis not present

## 2017-04-04 DIAGNOSIS — K635 Polyp of colon: Secondary | ICD-10-CM

## 2017-04-04 DIAGNOSIS — M545 Low back pain, unspecified: Secondary | ICD-10-CM

## 2017-04-04 DIAGNOSIS — E119 Type 2 diabetes mellitus without complications: Secondary | ICD-10-CM | POA: Diagnosis not present

## 2017-04-04 DIAGNOSIS — E782 Mixed hyperlipidemia: Secondary | ICD-10-CM

## 2017-04-04 DIAGNOSIS — I10 Essential (primary) hypertension: Secondary | ICD-10-CM

## 2017-04-04 DIAGNOSIS — E785 Hyperlipidemia, unspecified: Secondary | ICD-10-CM

## 2017-04-04 MED ORDER — GABAPENTIN 600 MG PO TABS: ORAL_TABLET | ORAL | 11 refills | Status: DC

## 2017-04-04 NOTE — Progress Notes (Signed)
Subjective:    Patient ID: Roberta Clark, female    DOB: September 20, 1960, 56 y.o.   MRN: [id number]  HPI   1.  Right hip replacement on February 22, 2017.  Not as much pain relief as had with left hip.  Followed by Dr. Maureen Ralphs.  Muscle relaxant helps and she has been told her pain is from the muscles.  She is receiving PT through [facility].  Has a follow up with Dr. Maureen Ralphs at September 11th at 3:30 p.m.  Feels she needs someone to help her with housekeeping.  She now has pain in her back.  Has an appointment with Dr. Ronnald Ramp, NS, Sept. 4th to have him take another look.  She, however, will not have anymore surgery.   She needs help with housework--laundry and cleaning, maybe some meals.   Back pain is in low back, right in the middle and sometimes radiates out both sides.  Started the week before coming home from rehab --discharged from rehab on August 13th.  Still having PT at her home 3 times weekly.   She is making sure she is moving around at intervals throughout the day.  2.  DM:  Has gained 15 lbs since March.  She wanted to gain weight, but discussed her A1C is up to 7.0% now and had been better controlled with less weight.  Discussed her BMI is on overweight range as well. Had eye exam this year.  Reportedly no diabetic changes.   3.  Hyperlipidemia:  Has been taking Fish oil plus Lopid, but not physically active due to pain before and after surgery throughout the summer.  Has not tolerated statins in past and refuses to try another.  4.  Proximal transverse colon sessile polyp with high grade dysplasia in 04/2013.  Is due for a repeat colonoscopy.  Off of Xarelto now for a little over 1 week.    5.  Hypertension:  Taking meds.  Controlled Current Meds  Medication Sig  . amitriptyline (ELAVIL) 25 MG tablet TAKE ONE TABLET BY MOUTH AT BEDTIME AS NEEDED FOR SLEEP (Patient taking differently: TAKE ONE TABLET BY MOUTH AT BEDTIME)  . FLUoxetine (PROZAC) 20 MG capsule TAKE ONE CAPSULE BY MOUTH ONCE  DAILY  . gabapentin (NEURONTIN) 300 MG capsule Take 1 capsule (300 mg total) by mouth 3 (three) times daily. Take two capsules in the AM, two at lunch, and 4 at bedtime. (Patient taking differently: Take 600 mg by mouth 2 (two) times daily. MORNING & BEDTIME.)  . gemfibrozil (LOPID) 600 MG tablet TAKE ONE TABLET BY MOUTH TWICE DAILY WITH MEALS  . KLOR-CON M10 10 MEQ tablet TAKE ONE TABLET BY MOUTH ONCE DAILY  . lisinopril-hydrochlorothiazide (PRINZIDE,ZESTORETIC) 20-12.5 MG tablet TAKE ONE TABLET BY MOUTH ONCE DAILY  . Melatonin 3 MG SUBL Place 6 mg under the tongue at bedtime.  Marland Kitchen oxyCODONE (OXY IR/ROXICODONE) 5 MG immediate release tablet Take 1-3 tablets (5-15 mg total) by mouth every 4 (four) hours as needed for moderate pain or severe pain.  Marland Kitchen tiZANidine (ZANAFLEX) 4 MG tablet Take 4 mg by mouth every 8 (eight) hours as needed for muscle spasms.  Somehow,  Was given Rx for 600 mg Gabapentin tabs and is taking an additional 600 mg with each of her three doses daily, which comes to 5,400 mg daily--too much   Review of Systems     Objective:   Physical Exam  NAD Lungs: CTA CV:  RRR with normal S1 and S2, No S3, S4  or murmur.  Radial and DP pulses normal and equal. Abd:  S, NT, No HSM or mass, + BS Right groin: Induration or thickening of tissues without erythema in right groin over anterior incisional scar.  No pulse there, no bruit.  Mildly tender. Back:  Tender over proximal surgical scar and one vertebral spinous process above, mid lumbar back.  No significant tenderness over paraspinous musculature.   Neuro:  Sensory to light touch of LE grossly normal.  DTRs 2+/4 , motor 5/5 LE     Assessment & Plan:  1.  Ortho concerns:  Low back and bilateral hips:  Patient is not certain about PT.  Was to start last week with her home health agency, but she has yet to get started.  She is to call her agency and if does not get started or she is unable to make headway, to call us back and give  contact information so we can move this along. Encouraged her to speak with PT about the possibility of exercise in water, particularly deep slow flutter and whip kicks to strengthen pelvic/hip/ thigh muscles. Needs help with home care as well to prevent increased back pain.  We will look into that also  2.  Essential Hypertension:  Controlled nicely  3.  DM:  To get back to work on eating healthier.  Hopefully, also will be able to be more physically active.  4.  Hyperlipidemia:  As in #3.  Will recheck FLP in 3 months.  Did add fish oil.  5.  History of dysplastic transverse colon polyp 2014.  Unable to travel to [facility].  Needs referral locally--Dr. Ardis Hughs.  Now off anticoagulants.  6.  Chronic pain issues with back and hips:  Somehow, ended up with 2 different gabapentin prescriptions after discharge from rehab.  She is taking too much.  To finish her 300 mg tabs with dosage of 600 mg in morning, afternoon and 1200 mg at bedtime.  Will switch to 600 mg tabs for same dosing thereafter.     7.  Tobacco Dependence:  Discussed we will strongly address this next visit.

## 2017-04-05 ENCOUNTER — Telehealth: Payer: Self-pay | Admitting: Internal Medicine

## 2017-04-06 ENCOUNTER — Telehealth: Payer: Self-pay | Admitting: Internal Medicine

## 2017-04-06 NOTE — Telephone Encounter (Signed)
Patient called stating Dr. Amil Amen asked her to in the name of the doctor that prescribed her Gabapentin 600 MG 3 times a day. Patient states Dr. Ronnald Ramp whom did her back surgery is the one that prescribed Gabapentin

## 2017-04-06 NOTE — Telephone Encounter (Signed)
To Dr. Mulberry for FYI 

## 2017-04-19 NOTE — Telephone Encounter (Signed)
Spoke with medicaid. Patient is eligible for home health services as long as she is receiving in home physical therapy. Patient already has a agency that she is working with. They just need to initiate a request through there company for cleaning services. Spoke with patient. Verbalized understanding

## 2017-05-30 ENCOUNTER — Other Ambulatory Visit: Payer: Self-pay | Admitting: Internal Medicine

## 2017-05-30 DIAGNOSIS — M5442 Lumbago with sciatica, left side: Secondary | ICD-10-CM

## 2017-07-10 ENCOUNTER — Ambulatory Visit: Payer: Medicaid Other | Admitting: Internal Medicine

## 2017-07-10 ENCOUNTER — Encounter: Payer: Self-pay | Admitting: Internal Medicine

## 2017-07-10 ENCOUNTER — Other Ambulatory Visit: Payer: Self-pay | Admitting: Internal Medicine

## 2017-07-10 VITALS — BP 128/88 | HR 84 | Resp 12 | Ht 62.0 in | Wt 119.0 lb

## 2017-07-10 DIAGNOSIS — M87051 Idiopathic aseptic necrosis of right femur: Secondary | ICD-10-CM

## 2017-07-10 DIAGNOSIS — M87052 Idiopathic aseptic necrosis of left femur: Secondary | ICD-10-CM

## 2017-07-10 DIAGNOSIS — D126 Benign neoplasm of colon, unspecified: Secondary | ICD-10-CM

## 2017-07-10 DIAGNOSIS — I10 Essential (primary) hypertension: Secondary | ICD-10-CM

## 2017-07-10 DIAGNOSIS — E785 Hyperlipidemia, unspecified: Secondary | ICD-10-CM

## 2017-07-10 DIAGNOSIS — F17200 Nicotine dependence, unspecified, uncomplicated: Secondary | ICD-10-CM

## 2017-07-10 DIAGNOSIS — Z23 Encounter for immunization: Secondary | ICD-10-CM | POA: Diagnosis not present

## 2017-07-10 DIAGNOSIS — M1611 Unilateral primary osteoarthritis, right hip: Secondary | ICD-10-CM

## 2017-07-10 DIAGNOSIS — M5416 Radiculopathy, lumbar region: Secondary | ICD-10-CM

## 2017-07-10 DIAGNOSIS — M5442 Lumbago with sciatica, left side: Secondary | ICD-10-CM

## 2017-07-10 DIAGNOSIS — E119 Type 2 diabetes mellitus without complications: Secondary | ICD-10-CM

## 2017-07-10 NOTE — Patient Instructions (Signed)
Tobacco Cessation:   1800QUITNOW or [phone], the former for support and possibly free nicotine patches/gum and support; the latter for [facility] Smoking cessation class. Get rid of all smoking supplies:  Cigarettes, lighters, ashtrays--no stashes just in case at home if you are serious.  For Nicotine gum:  When you feel need for smoking:  Place nicotine gum in mouth and chew until juicy, then stick between gum and cheek.  You will absorb the nicotine from your mouth.   Do not aggressively chew gum. Spit gum out in garbage once you feel you are done with it.

## 2017-07-10 NOTE — Progress Notes (Signed)
Subjective:    Patient ID: Roberta Clark, female    DOB: Jun 16, 1961, 56 y.o.   MRN: [id number]  HPI   1.  Bilateral Hip and low back pain:  Better than pain was prior to all her surgeries--started with back surgery in 05/2016, left hip in March and right hip in July.  Reportedly has "arthritis"  Setting in with her hips.  Is to start injections soon. She has not started any PT.  She is walking with a 4 pronged cane.  She could get into water aerobics with her mom at [facility]   Taking Gabapentin 600 mg 2 times daily with 1200 mg at bedtime.  Helps with chronic pain. She describes pain at lateral thigh that radiates down lateral leg to knees.   Has not tried cushioning.  Discussed looking for a small donut pillow.  2.  DM:  Lost all the weight she gained. Was up to 130 lb.  Is not checking sugars.  3.  Hyperlipidemia:  nonfasting today.  Is due for FLP.  She can come back in next 2 weeks for fasting labs.  4. Dysplastic colon polyp from 2014:  Referred to [facility].  Patient states was told she needs to gather her records from [facility] and get them to [facility].  She has no transportation to [employer]. Discussed her records are in the Care Everywhere tab that [place] should be able to see.   5.  Essential Hypertension:  Tolerating medication fine/ Lisinopril/HCTZ.    6.  Tobacco use:  Down to 3 daily.  Did try the nicotine gum and worked well, but then when went to rehab for hip, started smoking again.  Current Meds  Medication Sig  . amitriptyline (ELAVIL) 25 MG tablet TAKE ONE TABLET BY MOUTH AT BEDTIME AS NEEDED FOR SLEEP  . FLUoxetine (PROZAC) 20 MG capsule TAKE ONE CAPSULE BY MOUTH ONCE DAILY  . gabapentin (NEURONTIN) 600 MG tablet 1 tab by mouth in morning and midday and 2 tabs by mouth at bedtime  . gemfibrozil (LOPID) 600 MG tablet TAKE ONE TABLET BY MOUTH TWICE DAILY WITH MEALS  . ibuprofen (ADVIL,MOTRIN) 800 MG tablet Take by mouth every 6 (six) hours as needed.   Marland Kitchen  KLOR-CON M10 10 MEQ tablet TAKE ONE TABLET BY MOUTH ONCE DAILY  . lisinopril-hydrochlorothiazide (PRINZIDE,ZESTORETIC) 20-12.5 MG tablet TAKE ONE TABLET BY MOUTH ONCE DAILY  . Melatonin 3 MG SUBL Place 6 mg under the tongue at bedtime.  . meloxicam (MOBIC) 7.5 MG tablet Take 7.5 mg by mouth daily. 2 tablets once a day  . Omega-3 Fatty Acids (FISH OIL) 1000 MG CAPS Take 1 capsule by mouth 2 (two) times daily.  Marland Kitchen tiZANidine (ZANAFLEX) 4 MG tablet Take 4 mg by mouth every 8 (eight) hours as needed for muscle spasms.    Allergies  Allergen Reactions  . Metformin And Related Nausea And Vomiting  . Lipitor [Atorvastatin] Rash      Review of Systems     Objective:   Physical Exam NAD Moves stiffly with cane HEENT:  PERRL, EOMI, discs sharp, TMs pearly gray, throat without injection Neck: Supple, No adenopathy Chest:  CTA CV:  RRR without murmur or rub, radial pulses normal and equal.   No LE edema. Abd:  S, NT, No HSM or mass, + BS Lateral thighs:  Tender over greater trochanter--reproduces pain down lateral thigh. Diabetic Foot Exam - Simple   Simple Foot Form Diabetic Foot exam was performed with the following  findings:  Yes 07/10/2017  3:30 PM  Visual Inspection No deformities, no ulcerations, no other skin breakdown bilaterally:  Yes Sensation Testing Intact to touch and monofilament testing bilaterally:  Yes Pulse Check Posterior Tibialis and Dorsalis pulse intact bilaterally:  Yes Comments          Assessment & Plan:  1.  Hip and low back surgeries in past year following chronic pain for lumbar DDD and bilateral AVN of hips. Again, have encouraged patient to get into water for regular physical activity.  She is contemplating.  2.  DM:  At 7.0% A1C earlier in year before hip surgeries.  Hopefully, will see stable to improved following weight loss and becoming more active.  To return for fasting labs in 1-2 weeks, including A1C Influenza vaccine today.  3.  Essential  Hypertension:  Would like to see diastolic a bit lower in 03T.  Again, increase physical activity.  4.  Hyperlipidemia:  Has not tolerated statins in past.  Hoping again with increased ability to move, will see an improvement as Gemfibrozil alone has not controlled.  5.  Dysplastic Colon polyp:  Is due for colonoscopy.  Not clear why running into requirement for hard copy of records that are already in her EHR/Care Everywhere.  Will check with GI.

## 2017-07-21 ENCOUNTER — Other Ambulatory Visit: Payer: Self-pay | Admitting: Internal Medicine

## 2017-07-21 DIAGNOSIS — Z1231 Encounter for screening mammogram for malignant neoplasm of breast: Secondary | ICD-10-CM

## 2017-07-25 ENCOUNTER — Other Ambulatory Visit: Payer: Medicaid Other

## 2017-08-18 ENCOUNTER — Encounter: Payer: Self-pay | Admitting: Internal Medicine

## 2017-08-24 DIAGNOSIS — M87059 Idiopathic aseptic necrosis of unspecified femur: Secondary | ICD-10-CM | POA: Insufficient documentation

## 2017-08-29 ENCOUNTER — Ambulatory Visit
Admission: RE | Admit: 2017-08-29 | Discharge: 2017-08-29 | Disposition: A | Discharge status: Home / Self Care | Payer: Medicaid Other | Source: Ambulatory Visit | Attending: Internal Medicine | Admitting: Internal Medicine

## 2017-08-29 DIAGNOSIS — Z1231 Encounter for screening mammogram for malignant neoplasm of breast: Secondary | ICD-10-CM

## 2017-08-29 NOTE — Telephone Encounter (Signed)
Opened in error

## 2017-09-07 ENCOUNTER — Other Ambulatory Visit: Payer: Medicaid Other

## 2017-09-08 ENCOUNTER — Other Ambulatory Visit (INDEPENDENT_AMBULATORY_CARE_PROVIDER_SITE_OTHER): Payer: Medicaid Other

## 2017-09-08 DIAGNOSIS — E782 Mixed hyperlipidemia: Secondary | ICD-10-CM

## 2017-09-08 DIAGNOSIS — E119 Type 2 diabetes mellitus without complications: Secondary | ICD-10-CM

## 2017-09-09 LAB — LIPID PANEL W/O CHOL/HDL RATIO
CHOLESTEROL TOTAL: 253 mg/dL — AB (ref 100–199)
HDL: 39 mg/dL — AB (ref >39)
LDL CALC: 195 mg/dL — AB (ref 0–99)
TRIGLYCERIDES: 94 mg/dL (ref 0–149)
VLDL CHOLESTEROL CAL: 19 mg/dL (ref 5–40)

## 2017-09-09 LAB — HGB A1C W/O EAG: Hgb A1c MFr Bld: 6.7 % — ABNORMAL HIGH (ref 4.8–5.6)

## 2017-10-13 ENCOUNTER — Other Ambulatory Visit: Payer: Self-pay | Admitting: Internal Medicine

## 2017-10-13 DIAGNOSIS — G4701 Insomnia due to medical condition: Secondary | ICD-10-CM

## 2017-10-13 DIAGNOSIS — F329 Major depressive disorder, single episode, unspecified: Secondary | ICD-10-CM

## 2017-10-13 DIAGNOSIS — F32A Depression, unspecified: Secondary | ICD-10-CM

## 2017-10-13 DIAGNOSIS — G8929 Other chronic pain: Principal | ICD-10-CM

## 2017-11-30 DIAGNOSIS — M47817 Spondylosis without myelopathy or radiculopathy, lumbosacral region: Secondary | ICD-10-CM | POA: Insufficient documentation

## 2017-11-30 DIAGNOSIS — M461 Sacroiliitis, not elsewhere classified: Secondary | ICD-10-CM | POA: Insufficient documentation

## 2017-12-11 DIAGNOSIS — R200 Anesthesia of skin: Secondary | ICD-10-CM | POA: Insufficient documentation

## 2017-12-12 ENCOUNTER — Other Ambulatory Visit: Payer: Self-pay | Admitting: Internal Medicine

## 2017-12-18 ENCOUNTER — Ambulatory Visit: Payer: Medicare Other | Attending: Chiropractic Medicine | Admitting: Physical Therapy

## 2017-12-18 ENCOUNTER — Encounter: Payer: Self-pay | Admitting: Physical Therapy

## 2017-12-18 ENCOUNTER — Other Ambulatory Visit: Payer: Self-pay

## 2017-12-18 DIAGNOSIS — M25551 Pain in right hip: Secondary | ICD-10-CM

## 2017-12-18 DIAGNOSIS — M25552 Pain in left hip: Secondary | ICD-10-CM

## 2017-12-18 DIAGNOSIS — M6281 Muscle weakness (generalized): Secondary | ICD-10-CM | POA: Diagnosis present

## 2017-12-18 DIAGNOSIS — R252 Cramp and spasm: Secondary | ICD-10-CM

## 2017-12-18 NOTE — Patient Instructions (Signed)
   Transverse Abdominus Activation  Lying on your back, pull your bellybutton into your spine.   Hold for 3-5 seconds and then relax.  Repeat 15-20 times, 3-5 times per day.    QUADRICEPS STRETCH - SIDELYING  Lie on your side with your target limb on top. Next, grab your target limb below the knee and pull your knee into a more bent position until a stretch is felt along the front of your thigh.  Hold for 30 seconds, then relax.  Repeat 2-3 times each side, twice a day (morning and evening).     scar mobilization  Scar Mobilization 1. A healthy scar moves in all directions, up/down, side to side 2. a healthy scar can also be pulled away from the body in a "pinching" fashion and look the same as non-scared skin 3. in a relaxed position directly on the scar, move it up/down, side to side and pull it away  4. on the skin directly above the scar, do the same motion, on the skin directly below and on each side of the scar do the same thing.   after a scar massage, that could last up to 5 minutes daily, it is often normal to have a burning sensation  You can also rub a washcloth or pillowcase over the front of your legs and your scars- this will help to desensitize these areas and "retrain" the sensory nerves in these areas.

## 2017-12-18 NOTE — Therapy (Signed)
[place], [place], [address] Phone: [phone]   Fax:  [phone]  Physical Therapy Evaluation  Patient Details  Name: Roberta Clark MRN: [id number] Date of Birth: [birth year] Referring Provider: Levy Pupa PA-C    Encounter Date: 12/18/2017  PT End of Session - 12/18/17 1246    Visit Number  1    Number of Visits  9    Date for PT Re-Evaluation  01/15/18    Authorization Type  Medicare/Medicaid (PN due 10th note)    Authorization Time Period  12/18/17 to 01/18/18    PT Start Time  1017    PT Stop Time  1057    PT Time Calculation (min)  40 min    Activity Tolerance  Patient tolerated treatment well    Behavior During Therapy  [facility] for tasks assessed/performed       Past Medical History:  Diagnosis Date  . Adenomatous colon polyp 05/13/2013  . Anemia   . Arthritis   . Chronic pain   . Depression   . Diabetes mellitus without complication ([place]) 6269   Type II - diet controlled  . Family history of adverse reaction to anesthesia    mother - nausea  . History of anemia   . Hyperlipidemia 2011   Rash with statin:  not well controlled with Gemfibrozil  . Hypertension 2011  . Insomnia secondary to chronic pain   . Low back pain with left-sided sciatica    Reportedly with lumbar spinal stenosis    Past Surgical History:  Procedure Laterality Date  . BACK SURGERY    . COLONOSCOPY    . LUMBAR LAMINECTOMY/DECOMPRESSION MICRODISCECTOMY Bilateral 05/27/2016   Procedure: Left Lumbar Four-Five Laminectomy and Foraminotomy;  Surgeon: Eustace Moore, MD;  Location: [facility];  Service: Neurosurgery;  Laterality: Bilateral;  . TOTAL HIP ARTHROPLASTY Left 10/24/2016   Procedure: LEFT TOTAL HIP ARTHROPLASTY ANTERIOR APPROACH;  Surgeon: Gaynelle Arabian, MD;  Location: [facility];  Service: Orthopedics;  Laterality: Left;  . TOTAL HIP ARTHROPLASTY Right 02/22/2017   Procedure: RIGHT TOTAL HIP ARTHROPLASTY ANTERIOR APPROACH;   Surgeon: Gaynelle Arabian, MD;  Location: [facility];  Service: Orthopedics;  Laterality: Right;  . TUBAL LIGATION  1985    There were no vitals filed for this visit.   Subjective Assessment - 12/18/17 1019    Subjective  I had both hips replaced but I am still having a lot of pain. The first was in March 2018, the second was done in summer 2018, they are both anterior approach. No matter what I do they hurt, I had no revisions done. Some tingling goes on, it goes along the front of my groin and then goes down the front of my legs on both sides. I have been getting really bad cramps in both legs in my calves and in my feet. This did not happen before my surgeries. I went to [facility] for both of my hips after surgery. I had disc surgery done before I had my hip surgeries. The only way I can get rid of my pain is to lay on my back, otherwise I have non-stop shooting pain. My back hurts me when I first stand up, and I do wear a back brace that helps somewhat but does not take care of the pain.      How long can you sit comfortably?  less than 5 minutes     How long can you stand comfortably?  5  minutes     How long can you walk comfortably?  I do pretty good with walking unless I do a lot     Patient Stated Goals  get rid of pain as much as possible, get back to regular activities     Currently in Pain?  Yes    Pain Score  8     Pain Location  Hip and low back     Pain Orientation  Right;Left;Anterior    Pain Descriptors / Indicators  Throbbing;Sharp;Stabbing    Pain Type  Chronic pain    Pain Radiating Towards  down the front of both legs to feet     Pain Onset  More than a month ago    Pain Frequency  Constant    Aggravating Factors   most activities     Pain Relieving Factors  laying on back, wearing back brace     Effect of Pain on Daily Activities  severe          [facility] PT Assessment - 12/18/17 0001      Assessment   Medical Diagnosis  bilateral hip pain s/p anterior THAs      Referring Provider  Levy Pupa PA-C     Onset Date/Surgical Date  -- 2018    Next MD Visit  PRN, need to make appointment soon     Prior Therapy  PT at [facility] following both surgeries       Precautions   Precautions  None    Precaution Comments  B anterior THAs done in March and July of 2018       Restrictions   Weight Bearing Restrictions  No      Balance Screen   Has the patient fallen in the past 6 months  No    Has the patient had a decrease in activity level because of a fear of falling?   Yes    Is the patient reluctant to leave their home because of a fear of falling?   No      Prior Function   Level of Independence  Independent;Independent with basic ADLs;Independent with gait;Independent with transfers    Vocation  Part time employment    Vocation Requirements  works in [employer]     Leisure  hanging out with grandkids, walking for exercise       Observation/Other Assessments   Observations  clonus (-) B       Sensation   Light Touch  Impaired by gross assessment    Additional Comments  L LE dermatomes impaired L5 and S1      Coordination   Gross Motor Movements are Fluid and Coordinated  Yes      ROM / Strength   AROM / PROM / Strength  AROM;Strength      AROM   AROM Assessment Site  Lumbar;Hip    Lumbar Flexion  approx 30% limited; RFIS mild pain in back     Lumbar Extension  patient declines due to pain with this activity     Lumbar - Right Side Bend  WFL     Lumbar - Left Side [facility]       Strength   Strength Assessment Site  Hip;Knee;Ankle    Right/Left Hip  Left;Right    Right Hip Flexion  4+/5    Right Hip Extension  4/5    Right Hip ABduction  5/5    Left Hip Flexion  4+/5    Left Hip  Extension  4+/5    Left Hip ABduction  4+/5    Right/Left Knee  Left;Right    Right Knee Flexion  4+/5    Right Knee Extension  5/5    Left Knee Flexion  4+/5    Left Knee Extension  5/5    Right/Left Ankle  Left;Right    Right Ankle  Dorsiflexion  5/5    Left Ankle Dorsiflexion  5/5      Flexibility   Soft Tissue Assessment /Muscle Length  yes    Hamstrings  WFL     Quadriceps  moderate tightness B     Piriformis  very mild tightness B       Palpation   Palpation comment  severe spasm B lumbar paraspinals, TTP B glutes; incision on L hip with moderate fasical restrictions                  Objective measurements completed on examination: See above findings.      [place] Adult PT Treatment/Exercise - 12/18/17 0001      Exercises   Exercises  Knee/Hip       Quad stretches 3x30 seconds R, core sets with 3 second holds 1x10, training regarding scar massage and desensitization of B quads       PT Education - 12/18/17 1246    Education provided  Yes    Education Details  prognosis, POC, HEP, exam findings, scar massage and desensitization techniques     Person(s) Educated  Patient    Methods  Explanation;Handout    Comprehension  Verbalized understanding;Need further instruction       PT Short Term Goals - 12/18/17 1252      PT SHORT TERM GOAL #1   Title  Patient to be independent with appropriate HEP, to be updated PRN     Time  1    Period  Weeks    Status  New    Target Date  12/25/17        PT Long Term Goals - 12/18/17 1253      PT LONG TERM GOAL #1   Title  Paitent to demonstrate resolution of muscle flexibility deficits in order to assist in reducing pain and improving overall mobility     Time  4    Period  Weeks    Status  New    Target Date  01/15/18      PT LONG TERM GOAL #2   Title  Patient to report pain in bilateral hips an back as being no more than 3/10 at worst to improve QOL and tolerance to functional activities     Time  4    Period  Weeks    Status  New      PT LONG TERM GOAL #3   Title  Patient to demonstrate functional muscle strength as being 5/5 in order to assist in reducing pain and improving mobility     Time  4    Period  Weeks    Status  New       PT LONG TERM GOAL #4   Title  Patient to report resolution of symptoms running down anterior LEs in order to show improvement of condition     Time  4    Period  Weeks    Status  New             Plan - 12/18/17 1248    Clinical Impression Statement  Patient arrives with bilateral hip  pain that is constant and fairly non-relenting in nature; she report that she had both hips replaced last year (March and July 2018 per chart review) and she has had pain ever since, she also reports history of back pain and back surgery. Examination reveals mild functional strength deficits, severe core weakness,  myofascial restrictions on B hip incision scars with L worse than R, reduced muscle flexibility, lumbar and hip stiffness, reduced muscle flexibility, and reduced light touch sensation in L5-S1 dermatomes. Suspect possible involvement of lumbar spine in addition to deficits and impairments located around bilateral hips. She may benefit from skilled PT services to attempt to address functional deficits and reduce pain moving forward.     History and Personal Factors relevant to plan of care:  hx of B anterior hips done in 2018 (march and july), history of lumbar laminectomy/discectomy     Clinical Presentation  Stable    Clinical Decision Making  Moderate    Rehab Potential  Good    PT Frequency  2x / week    PT Duration  4 weeks    PT Treatment/Interventions  ADLs/Self Care Home Management;Cryotherapy;Biofeedback;Electrical Stimulation;Iontophoresis 4mg /ml Dexamethasone;Moist Heat;Ultrasound;Functional mobility training;Therapeutic activities;Therapeutic exercise;Balance training;Neuromuscular re-education;Patient/family education;Manual techniques;Scar mobilization;Passive range of motion;Dry needling;Taping;Energy conservation    PT Next Visit Plan  review HEP and goals; anterior LE stretching, scar mobility, lumbar mobility and core activation     PT Home Exercise Plan  Eval: quad stretches, TA  activation, desensitization techniques/scar massage     Consulted and Agree with Plan of Care  Patient       Patient will benefit from skilled therapeutic intervention in order to improve the following deficits and impairments:  Increased fascial restricitons, Improper body mechanics, Pain, Decreased coordination, Decreased mobility, Decreased scar mobility, Increased muscle spasms, Postural dysfunction, Decreased range of motion, Decreased strength, Hypomobility, Difficulty walking, Impaired flexibility  Visit Diagnosis: Pain in left hip - Plan: PT plan of care cert/re-cert  Pain in right hip - Plan: PT plan of care cert/re-cert  Cramp and spasm - Plan: PT plan of care cert/re-cert  Muscle weakness (generalized) - Plan: PT plan of care cert/re-cert     Problem List Patient Active Problem List   Diagnosis Date Noted  . Avascular necrosis of femoral head, right ([place]) 02/22/2017  . Avascular necrosis of femoral head, left ([place]) 10/24/2016  . OA (osteoarthritis) of hip 10/24/2016  . S/P lumbar laminectomy 05/27/2016  . Type 2 diabetes mellitus without complication, without long-term current use of insulin ([place]) 11/03/2015  . Lumbar radiculopathy, chronic 06/14/2013  . Adenomatous colon polyp 05/13/2013  . Vaginal discharge 08/22/2012  . Hypertension 07/25/2012  . Hyperlipidemia with target LDL less than 100 07/25/2012  . Preventative health care 07/25/2012  . Hot flash, menopausal 07/25/2012  . Chronic low back pain 07/25/2012  . Fibroids 09/02/2011  . Tobacco dependence 09/02/2011  . Hyperlipidemia 08/15/2009    Deniece Ree PT, DPT, [place]  Supplemental Physical Therapist [place]   Pager [facility] [address] [place], [place], [address] Phone: [phone]   Fax:  [phone]  Name: Roberta Clark MRN: [id number] Date of Birth: 02-Oct-1960

## 2017-12-20 ENCOUNTER — Other Ambulatory Visit: Payer: Self-pay | Admitting: Internal Medicine

## 2017-12-20 DIAGNOSIS — F32A Depression, unspecified: Secondary | ICD-10-CM

## 2017-12-20 DIAGNOSIS — F329 Major depressive disorder, single episode, unspecified: Secondary | ICD-10-CM

## 2017-12-20 MED ORDER — FLUOXETINE HCL 20 MG PO CAPS: 20.0000 mg | ORAL_CAPSULE | Freq: Every day | ORAL | 7 refills | Status: DC

## 2017-12-28 ENCOUNTER — Encounter: Payer: Self-pay | Admitting: Physical Therapy

## 2017-12-28 ENCOUNTER — Ambulatory Visit: Payer: Medicare Other | Admitting: Physical Therapy

## 2017-12-28 DIAGNOSIS — M25551 Pain in right hip: Secondary | ICD-10-CM

## 2017-12-28 DIAGNOSIS — M6281 Muscle weakness (generalized): Secondary | ICD-10-CM

## 2017-12-28 DIAGNOSIS — M25552 Pain in left hip: Secondary | ICD-10-CM

## 2017-12-28 DIAGNOSIS — R252 Cramp and spasm: Secondary | ICD-10-CM

## 2017-12-28 NOTE — Therapy (Signed)
[place], [place], [address] Phone: [phone]   Fax:  [phone]  Physical Therapy Treatment  Patient Details  Name: Roberta Clark MRN: [id number] Date of Birth: 03-31-1961 Referring Provider: Levy Pupa PA-C    Encounter Date: 12/28/2017  PT End of Session - 12/28/17 1501    Visit Number  2    Number of Visits  9    Date for PT Re-Evaluation  01/15/18    Authorization Type  Medicare/Medicaid (PN due 10th note)    Authorization Time Period  12/18/17 to 01/18/18    PT Start Time  1420    PT Stop Time  1450 moist heat not included in billing     PT Time Calculation (min)  30 min    Activity Tolerance  Patient tolerated treatment well    Behavior During Therapy  [facility] for tasks assessed/performed       Past Medical History:  Diagnosis Date  . Adenomatous colon polyp 05/13/2013  . Anemia   . Arthritis   . Chronic pain   . Depression   . Diabetes mellitus without complication ([place]) 4665   Type II - diet controlled  . Family history of adverse reaction to anesthesia    mother - nausea  . History of anemia   . Hyperlipidemia 2011   Rash with statin:  not well controlled with Gemfibrozil  . Hypertension 2011  . Insomnia secondary to chronic pain   . Low back pain with left-sided sciatica    Reportedly with lumbar spinal stenosis    Past Surgical History:  Procedure Laterality Date  . BACK SURGERY    . COLONOSCOPY    . LUMBAR LAMINECTOMY/DECOMPRESSION MICRODISCECTOMY Bilateral 05/27/2016   Procedure: Left Lumbar Four-Five Laminectomy and Foraminotomy;  Surgeon: Eustace Moore, MD;  Location: [facility];  Service: Neurosurgery;  Laterality: Bilateral;  . TOTAL HIP ARTHROPLASTY Left 10/24/2016   Procedure: LEFT TOTAL HIP ARTHROPLASTY ANTERIOR APPROACH;  Surgeon: Gaynelle Arabian, MD;  Location[facility];  Service: Orthopedics;  Laterality: Left;  . TOTAL HIP ARTHROPLASTY Right 02/22/2017   Procedure: RIGHT TOTAL  HIP ARTHROPLASTY ANTERIOR APPROACH;  Surgeon: Gaynelle Arabian, MD;  Location[facility];  Service: Orthopedics;  Laterality: Right;  . TUBAL LIGATION  1985    There were no vitals filed for this visit.  Subjective Assessment - 12/28/17 1422    Subjective  I am still having a lot of pain, I called my doctor to see if I can get more medicines but they have not called back yet. My HEP feels like it is helping.     Patient Stated Goals  get rid of pain as much as possible, get back to regular activities     Currently in Pain?  Yes    Pain Score  8     Pain Location  Hip    Pain Orientation  Right;Left    Pain Descriptors / Indicators  Throbbing;Sharp;Stabbing    Pain Type  Chronic pain    Pain Radiating Towards  down front of both legs to feet     Pain Onset  More than a month ago    Pain Frequency  Constant    Aggravating Factors   most activities     Pain Relieving Factors  nothing     Effect of Pain on Daily Activities  severe  [place] Adult PT Treatment/Exercise - 12/28/17 0001      Exercises   Exercises  Lumbar      Lumbar Exercises: Stretches   Active Hamstring Stretch  2 reps;30 seconds    Single Knee to Chest Stretch  5 reps;10 seconds    Lower Trunk Rotation  5 reps;10 seconds    Piriformis Stretch  1 rep;30 seconds    Other Lumbar Stretch Exercise  frog stretch 3x30 seconds B       Lumbar Exercises: Supine   Bridge Limitations  attempted, unable due to pain       Modalities   Modalities  Moist Heat      Moist Heat Therapy   Number Minutes Moist Heat  8 Minutes not included in billing    Moist Heat Location  Knee;Lumbar Spine L quad, lumbar spine       Manual Therapy   Manual Therapy  Soft tissue mobilization    Manual therapy comments  separate from all other skilled services     Soft tissue mobilization  L quad, ball assisted              PT Education - 12/28/17 1501    Education provided  Yes    Education Details   benefits of moist heat, soft tissue limitations, POC moving forward     Person(s) Educated  Patient    Methods  Explanation    Comprehension  Verbalized understanding       PT Short Term Goals - 12/18/17 1252      PT SHORT TERM GOAL #1   Title  Patient to be independent with appropriate HEP, to be updated PRN     Time  1    Period  Weeks    Status  New    Target Date  12/25/17        PT Long Term Goals - 12/18/17 1253      PT LONG TERM GOAL #1   Title  Paitent to demonstrate resolution of muscle flexibility deficits in order to assist in reducing pain and improving overall mobility     Time  4    Period  Weeks    Status  New    Target Date  01/15/18      PT LONG TERM GOAL #2   Title  Patient to report pain in bilateral hips an back as being no more than 3/10 at worst to improve QOL and tolerance to functional activities     Time  4    Period  Weeks    Status  New      PT LONG TERM GOAL #3   Title  Patient to demonstrate functional muscle strength as being 5/5 in order to assist in reducing pain and improving mobility     Time  4    Period  Weeks    Status  New      PT LONG TERM GOAL #4   Title  Patient to report resolution of symptoms running down anterior LEs in order to show improvement of condition     Time  4    Period  Weeks    Status  New            Plan - 12/28/17 1501    Clinical Impression Statement  Patient arrives today continuing to experience severe pain which is constant and without much relief thus far, however she does report that her HEP does seem to be helping. Introduced functional mobility  exercises for lumbar spine, attempted core/hip strength exercises but severely limited by pain and spasm L quad and lumbar paraspinals. Applied moist heat (not included in billing) and trialed STM to L quad to attempt to relax muscle/reduce pain. Encouraged patient to openly communicate with therapist about quality of pain (worse, better, change in pain  quality, etc) during session to identify most effective activities/exercises for patient moving forward instead of pushing through non-productive pains/worsening pain.     Rehab Potential  Good    PT Frequency  2x / week    PT Duration  4 weeks    PT Treatment/Interventions  ADLs/Self Care Home Management;Cryotherapy;Biofeedback;Electrical Stimulation;Iontophoresis 4mg /ml Dexamethasone;Moist Heat;Ultrasound;Functional mobility training;Therapeutic activities;Therapeutic exercise;Balance training;Neuromuscular re-education;Patient/family education;Manual techniques;Scar mobilization;Passive range of motion;Dry needling;Taping;Energy conservation    PT Next Visit Plan  moist heat to L quad, R TFL/ITB, lumbar spine; primary focus on heat and sfot tissue work next session for pain control     PT Home Exercise Plan  Eval: quad stretches, TA activation, desensitization techniques/scar massage     Consulted and Agree with Plan of Care  Patient       Patient will benefit from skilled therapeutic intervention in order to improve the following deficits and impairments:  Increased fascial restricitons, Improper body mechanics, Pain, Decreased coordination, Decreased mobility, Decreased scar mobility, Increased muscle spasms, Postural dysfunction, Decreased range of motion, Decreased strength, Hypomobility, Difficulty walking, Impaired flexibility  Visit Diagnosis: Pain in left hip  Pain in right hip  Cramp and spasm  Muscle weakness (generalized)     Problem List Patient Active Problem List   Diagnosis Date Noted  . Avascular necrosis of femoral head, right ([place]) 02/22/2017  . Avascular necrosis of femoral head, left ([place]) 10/24/2016  . OA (osteoarthritis) of hip 10/24/2016  . S/P lumbar laminectomy 05/27/2016  . Type 2 diabetes mellitus without complication, without long-term current use of insulin ([place]) 11/03/2015  . Lumbar radiculopathy, chronic 06/14/2013  . Adenomatous colon polyp  05/13/2013  . Vaginal discharge 08/22/2012  . Hypertension 07/25/2012  . Hyperlipidemia with target LDL less than 100 07/25/2012  . Preventative health care 07/25/2012  . Hot flash, menopausal 07/25/2012  . Chronic low back pain 07/25/2012  . Fibroids 09/02/2011  . Tobacco dependence 09/02/2011  . Hyperlipidemia 08/15/2009    Deniece Ree PT, DPT, [place]  Supplemental Physical Therapist [place]   Pager [place] [facility] [address] [place], [place], [address] Phone: [phone]   Fax:  [phone]  Name: Roberta Clark MRN: [id number] Date of Birth: 1961-06-27

## 2018-01-02 ENCOUNTER — Ambulatory Visit: Payer: Medicare Other | Admitting: Physical Therapy

## 2018-01-02 ENCOUNTER — Encounter: Payer: Self-pay | Admitting: Physical Therapy

## 2018-01-02 DIAGNOSIS — M25552 Pain in left hip: Secondary | ICD-10-CM | POA: Diagnosis not present

## 2018-01-02 DIAGNOSIS — M6281 Muscle weakness (generalized): Secondary | ICD-10-CM

## 2018-01-02 DIAGNOSIS — M25551 Pain in right hip: Secondary | ICD-10-CM

## 2018-01-02 DIAGNOSIS — R252 Cramp and spasm: Secondary | ICD-10-CM

## 2018-01-02 NOTE — Therapy (Signed)
[place], [place], [address] Phone: [phone]   Fax:  [phone]  Physical Therapy Treatment  Patient Details  Name: Roberta Clark MRN: [id number] Date of Birth: 1960-09-18 Referring Provider: Levy Pupa PA-C    Encounter Date: 01/02/2018  PT End of Session - 01/02/18 1542    Visit Number  3    Number of Visits  9    Date for PT Re-Evaluation  01/15/18    Authorization Type  Medicare/Medicaid (PN due 10th note)    Authorization Time Period  12/18/17 to 01/18/18    PT Start Time  1500    PT Stop Time  1540    PT Time Calculation (min)  40 min    Activity Tolerance  Patient tolerated treatment well    Behavior During Therapy  [facility] for tasks assessed/performed       Past Medical History:  Diagnosis Date  . Adenomatous colon polyp 05/13/2013  . Anemia   . Arthritis   . Chronic pain   . Depression   . Diabetes mellitus without complication ([place]) 1607   Type II - diet controlled  . Family history of adverse reaction to anesthesia    mother - nausea  . History of anemia   . Hyperlipidemia 2011   Rash with statin:  not well controlled with Gemfibrozil  . Hypertension 2011  . Insomnia secondary to chronic pain   . Low back pain with left-sided sciatica    Reportedly with lumbar spinal stenosis    Past Surgical History:  Procedure Laterality Date  . BACK SURGERY    . COLONOSCOPY    . LUMBAR LAMINECTOMY/DECOMPRESSION MICRODISCECTOMY Bilateral 05/27/2016   Procedure: Left Lumbar Four-Five Laminectomy and Foraminotomy;  Surgeon: Eustace Moore, MD;  Location: [facility];  Service: Neurosurgery;  Laterality: Bilateral;  . TOTAL HIP ARTHROPLASTY Left 10/24/2016   Procedure: LEFT TOTAL HIP ARTHROPLASTY ANTERIOR APPROACH;  Surgeon: Gaynelle Arabian, MD;  [facility];  Service: Orthopedics;  Laterality: Left;  . TOTAL HIP ARTHROPLASTY Right 02/22/2017   Procedure: RIGHT TOTAL HIP ARTHROPLASTY ANTERIOR APPROACH;   Surgeon: Gaynelle Arabian, MD;  [facility];  Service: Orthopedics;  Laterality: Right;  . TUBAL LIGATION  1985    There were no vitals filed for this visit.  Subjective Assessment - 01/02/18 1448    Subjective  The heat and massage helped my pain last time a lot, I am feeling good. Nothing else going on.     Patient Stated Goals  get rid of pain as much as possible, get back to regular activities     Currently in Pain?  Yes    Pain Score  5     Pain Location  Other (Comment) low back and B anterior thighs     Pain Orientation  Left;Right    Pain Descriptors / Indicators  Stabbing;Throbbing;[facility] Adult PT Treatment/Exercise - 01/02/18 0001      Lumbar Exercises: Stretches   Quad Stretch  2 reps;30 seconds      Lumbar Exercises: Supine   Clam  10 reps;3 seconds red TB     Bridge  10 reps    Bridge with clamshell  10 reps;Other (comment) red TB     Other Supine Lumbar Exercises  supine marches red TB 1x10 each LE       Lumbar  Exercises: Sidelying   Hip Abduction  Both;5 reps cues for form       Lumbar Exercises: Prone   Straight Leg Raise  5 reps      Modalities   Modalities  Moist Heat      Moist Heat Therapy   Number Minutes Moist Heat  10 Minutes not included in billing     Moist Heat Location  Knee;Lumbar Spine B anterior thighs, lumbar spine in supine       Manual Therapy   Manual Therapy  Soft tissue mobilization    Manual therapy comments  separate from all other skilled services     Soft tissue mobilization  B quads with rolling pin, lumbar spine with ball assist; scar massage pressure as tolerated L hip due to severe fasical restrictions surrounding scar               PT Education - 01/02/18 1542    Education provided  Yes    Education Details  purpose of approach today to reduce pain, POC moving forward, encouraged patient to follow up with MD regarding insidious cramping in LEs     Person(s) Educated   Patient    Methods  Explanation    Comprehension  Verbalized understanding       PT Short Term Goals - 12/18/17 1252      PT SHORT TERM GOAL #1   Title  Patient to be independent with appropriate HEP, to be updated PRN     Time  1    Period  Weeks    Status  New    Target Date  12/25/17        PT Long Term Goals - 12/18/17 1253      PT LONG TERM GOAL #1   Title  Paitent to demonstrate resolution of muscle flexibility deficits in order to assist in reducing pain and improving overall mobility     Time  4    Period  Weeks    Status  New    Target Date  01/15/18      PT LONG TERM GOAL #2   Title  Patient to report pain in bilateral hips an back as being no more than 3/10 at worst to improve QOL and tolerance to functional activities     Time  4    Period  Weeks    Status  New      PT LONG TERM GOAL #3   Title  Patient to demonstrate functional muscle strength as being 5/5 in order to assist in reducing pain and improving mobility     Time  4    Period  Weeks    Status  New      PT LONG TERM GOAL #4   Title  Patient to report resolution of symptoms running down anterior LEs in order to show improvement of condition     Time  4    Period  Weeks    Status  New            Plan - 01/02/18 1543    Clinical Impression Statement  Began session with moist heat packs to bilateral thighs as well as lumbar spine to reduce pain and to attempt to reduce significant muscle spasms in these areas. Followed this with STM to thighs and low back and finished session with functional exercises and stretches as tolerated as time allowed today. Patient seems to be responding well to this approach thus far.  Rehab Potential  Good    PT Frequency  2x / week    PT Duration  4 weeks    PT Treatment/Interventions  ADLs/Self Care Home Management;Cryotherapy;Biofeedback;Electrical Stimulation;Iontophoresis 4mg /ml Dexamethasone;Moist Heat;Ultrasound;Functional mobility training;Therapeutic  activities;Therapeutic exercise;Balance training;Neuromuscular re-education;Patient/family education;Manual techniques;Scar mobilization;Passive range of motion;Dry needling;Taping;Energy conservation    PT Next Visit Plan  moist heat to L quad, R TFL/ITB, lumbar spine; primary focus on heat and sfot tissue work next session for pain control. Ther ex as tolerated. Update HEP. Core training.     PT Home Exercise Plan  Eval: quad stretches, TA activation, desensitization techniques/scar massage     Consulted and Agree with Plan of Care  Patient       Patient will benefit from skilled therapeutic intervention in order to improve the following deficits and impairments:  Increased fascial restricitons, Improper body mechanics, Pain, Decreased coordination, Decreased mobility, Decreased scar mobility, Increased muscle spasms, Postural dysfunction, Decreased range of motion, Decreased strength, Hypomobility, Difficulty walking, Impaired flexibility  Visit Diagnosis: Pain in left hip  Pain in right hip  Cramp and spasm  Muscle weakness (generalized)     Problem List Patient Active Problem List   Diagnosis Date Noted  . Avascular necrosis of femoral head, right ([place]) 02/22/2017  . Avascular necrosis of femoral head, left ([place]) 10/24/2016  . OA (osteoarthritis) of hip 10/24/2016  . S/P lumbar laminectomy 05/27/2016  . Type 2 diabetes mellitus without complication, without long-term current use of insulin ([place]) 11/03/2015  . Lumbar radiculopathy, chronic 06/14/2013  . Adenomatous colon polyp 05/13/2013  . Vaginal discharge 08/22/2012  . Hypertension 07/25/2012  . Hyperlipidemia with target LDL less than 100 07/25/2012  . Preventative health care 07/25/2012  . Hot flash, menopausal 07/25/2012  . Chronic low back pain 07/25/2012  . Fibroids 09/02/2011  . Tobacco dependence 09/02/2011  . Hyperlipidemia 08/15/2009    Deniece Ree PT, DPT, [place]  Supplemental Physical Therapist [place]   Pager [facility] [address] [place], [place], [address] Phone: [phone]   Fax:  [phone]  Name: Roberta Clark MRN: [id number] Date of Birth: 11-02-1960

## 2018-01-04 ENCOUNTER — Ambulatory Visit: Payer: Medicare Other | Admitting: Physical Therapy

## 2018-01-04 ENCOUNTER — Telehealth: Payer: Self-pay | Admitting: Physical Therapy

## 2018-01-04 NOTE — Telephone Encounter (Signed)
No-show. Called and [facility] regarding no show today, provided info regarding time/date of next session.  Deniece Ree PT, DPT, CBIS  Supplemental Physical Therapist [facility]   Pager [phone]

## 2018-01-09 ENCOUNTER — Ambulatory Visit: Payer: Medicare Other | Admitting: Physical Therapy

## 2018-01-09 ENCOUNTER — Encounter: Payer: Self-pay | Admitting: Physical Therapy

## 2018-01-09 DIAGNOSIS — M6281 Muscle weakness (generalized): Secondary | ICD-10-CM

## 2018-01-09 DIAGNOSIS — R252 Cramp and spasm: Secondary | ICD-10-CM

## 2018-01-09 DIAGNOSIS — M25552 Pain in left hip: Secondary | ICD-10-CM

## 2018-01-09 DIAGNOSIS — M25551 Pain in right hip: Secondary | ICD-10-CM

## 2018-01-09 NOTE — Therapy (Signed)
[place], [place], [address] Phone: [phone]   Fax:  [phone]  Patient Details  Name: Roberta Clark MRN: [id number] Date of Birth: May 12, 1961 Referring Provider:  Levy Pupa, PA-C  Encounter Date: 01/09/2018   Patient arrives today feeling poorly and requesting only moist heat before she goes home. Education provided regarding importance of active exercises during PT, patient agreeable to performing activities exercises next session but states she is just feeling very poorly today and continues to request only heat. Moist heat applied for 12 minutes to lumbar spine and bilateral quads. No charge for today's session.    Deniece Ree PT, DPT, [place]  Supplemental Physical Therapist [place]   Pager [place] [facility] [address] [place], [place], [address] Phone: [phone]   Fax:  [phone]

## 2018-01-11 ENCOUNTER — Ambulatory Visit: Payer: Medicare Other | Admitting: Physical Therapy

## 2018-01-11 ENCOUNTER — Encounter: Payer: Self-pay | Admitting: Physical Therapy

## 2018-01-11 DIAGNOSIS — M25552 Pain in left hip: Secondary | ICD-10-CM

## 2018-01-11 DIAGNOSIS — R252 Cramp and spasm: Secondary | ICD-10-CM

## 2018-01-11 DIAGNOSIS — M25551 Pain in right hip: Secondary | ICD-10-CM

## 2018-01-11 DIAGNOSIS — M6281 Muscle weakness (generalized): Secondary | ICD-10-CM

## 2018-01-11 NOTE — Therapy (Signed)
[place], [place], [address] Phone: [phone]   Fax:  [phone]  Physical Therapy Treatment  Patient Details  Name: Roberta Clark MRN: [id number] Date of Birth: [birth year] Referring Provider: Levy Pupa PA-C    Encounter Date: 01/11/2018  PT End of Session - 01/11/18 1720    Visit Number  5    Number of Visits  9    Date for PT Re-Evaluation  01/15/18    Authorization Type  Medicare/Medicaid (PN due 10th note)    Authorization Time Period  12/18/17 to 01/18/18    PT Start Time  1642 moist heat not included in billing     PT Stop Time  1716    PT Time Calculation (min)  34 min    Activity Tolerance  Patient tolerated treatment well    Behavior During Therapy  [facility] for tasks assessed/performed       Past Medical History:  Diagnosis Date  . Adenomatous colon polyp 05/13/2013  . Anemia   . Arthritis   . Chronic pain   . Depression   . Diabetes mellitus without complication ([place]) 6962   Type II - diet controlled  . Family history of adverse reaction to anesthesia    mother - nausea  . History of anemia   . Hyperlipidemia 2011   Rash with statin:  not well controlled with Gemfibrozil  . Hypertension 2011  . Insomnia secondary to chronic pain   . Low back pain with left-sided sciatica    Reportedly with lumbar spinal stenosis    Past Surgical History:  Procedure Laterality Date  . BACK SURGERY    . COLONOSCOPY    . LUMBAR LAMINECTOMY/DECOMPRESSION MICRODISCECTOMY Bilateral 05/27/2016   Procedure: Left Lumbar Four-Five Laminectomy and Foraminotomy;  Surgeon: Eustace Moore, MD;  Location: [facility];  Service: Neurosurgery;  Laterality: Bilateral;  . TOTAL HIP ARTHROPLASTY Left 10/24/2016   Procedure: LEFT TOTAL HIP ARTHROPLASTY ANTERIOR APPROACH;  Surgeon: Gaynelle Arabian, MD;  Location: [facility];  Service: Orthopedics;  Laterality: Left;  . TOTAL HIP ARTHROPLASTY Right 02/22/2017   Procedure: RIGHT TOTAL  HIP ARTHROPLASTY ANTERIOR APPROACH;  Surgeon: Gaynelle Arabian, MD;  Location: [facility];  Service: Orthopedics;  Laterality: Right;  . TUBAL LIGATION  1985    There were no vitals filed for this visit.  Subjective Assessment - 01/11/18 1633    Subjective  I felt good after the heat last time. I'm up for exercise/STM today.     Patient Stated Goals  get rid of pain as much as possible, get back to regular activities     Currently in Pain?  Yes    Pain Score  6     Pain Location  Other (Comment) quads and low back     Pain Orientation  Right;Left    Pain Descriptors / Indicators  Stabbing;Throbbing;Sharp;Constant                       [facility] Adult PT Treatment/Exercise - 01/11/18 0001      Lumbar Exercises: Stretches   Double Knee to Chest Stretch  3 reps;10 seconds    Lower Trunk Rotation  5 reps;10 seconds    Hip Flexor Stretch  2 reps;30 seconds      Knee/Hip Exercises: Stretches   Sports administrator  Right;Left;2 reps;30 seconds limited by pain L LE     Other Knee/Hip Stretches  modified QL stretch as tolerated x3 10 second holds  Modalities   Modalities  Moist Heat      Moist Heat Therapy   Number Minutes Moist Heat  10 Minutes    Moist Heat Location  Knee;Lumbar Spine B quads      Manual Therapy   Manual Therapy  Soft tissue mobilization    Manual therapy comments  separate from all other skilled services     Soft tissue mobilization  B quads with rolling pin, lumbar spine with ball assist; R TFL/ITB               PT Education - 01/11/18 1720    Education provided  Yes    Education Details  HEP updates, POC moving forward, upcoming re-assess     Person(s) Educated  Patient    Methods  Explanation    Comprehension  Verbalized understanding       PT Short Term Goals - 12/18/17 1252      PT SHORT TERM GOAL #1   Title  Patient to be independent with appropriate HEP, to be updated PRN     Time  1    Period  Weeks    Status  New    Target Date   12/25/17        PT Long Term Goals - 12/18/17 1253      PT LONG TERM GOAL #1   Title  Paitent to demonstrate resolution of muscle flexibility deficits in order to assist in reducing pain and improving overall mobility     Time  4    Period  Weeks    Status  New    Target Date  01/15/18      PT LONG TERM GOAL #2   Title  Patient to report pain in bilateral hips an back as being no more than 3/10 at worst to improve QOL and tolerance to functional activities     Time  4    Period  Weeks    Status  New      PT LONG TERM GOAL #3   Title  Patient to demonstrate functional muscle strength as being 5/5 in order to assist in reducing pain and improving mobility     Time  4    Period  Weeks    Status  New      PT LONG TERM GOAL #4   Title  Patient to report resolution of symptoms running down anterior LEs in order to show improvement of condition     Time  4    Period  Weeks    Status  New            Plan - 01/11/18 1720    Clinical Impression Statement  Began session with moist heat (not included in billing) for pain control, immediately followed by STM to bilateral quads, R ITB/TFL, and lumbar paraspinals. Also continued with functional stretching and mobility work as tolerated today. Patient appears to possibly be slowly progressing, however will benefit from re-assessment within the next 1-2 sessions to assess progress/further potential with PT moving forward.     Rehab Potential  Good    PT Frequency  2x / week    PT Duration  4 weeks    PT Treatment/Interventions  ADLs/Self Care Home Management;Cryotherapy;Biofeedback;Electrical Stimulation;Iontophoresis 4mg /ml Dexamethasone;Moist Heat;Ultrasound;Functional mobility training;Therapeutic activities;Therapeutic exercise;Balance training;Neuromuscular re-education;Patient/family education;Manual techniques;Scar mobilization;Passive range of motion;Dry needling;Taping;Energy conservation    PT Next Visit Plan  continue with  moist heat, continue with STM as tolerated, incresae focus on lumbar/hip mobility and  exercise. Re-assess on 01/22/18. HEP update     PT Home Exercise Plan  Eval: quad stretches, TA activation, desensitization techniques/scar massage, hip flexor stretch     Consulted and Agree with Plan of Care  Patient       Patient will benefit from skilled therapeutic intervention in order to improve the following deficits and impairments:  Increased fascial restricitons, Improper body mechanics, Pain, Decreased coordination, Decreased mobility, Decreased scar mobility, Increased muscle spasms, Postural dysfunction, Decreased range of motion, Decreased strength, Hypomobility, Difficulty walking, Impaired flexibility  Visit Diagnosis: Pain in left hip  Pain in right hip  Cramp and spasm  Muscle weakness (generalized)     Problem List Patient Active Problem List   Diagnosis Date Noted  . Avascular necrosis of femoral head, right ([place]) 02/22/2017  . Avascular necrosis of femoral head, left ([place]) 10/24/2016  . OA (osteoarthritis) of hip 10/24/2016  . S/P lumbar laminectomy 05/27/2016  . Type 2 diabetes mellitus without complication, without long-term current use of insulin ([place]) 11/03/2015  . Lumbar radiculopathy, chronic 06/14/2013  . Adenomatous colon polyp 05/13/2013  . Vaginal discharge 08/22/2012  . Hypertension 07/25/2012  . Hyperlipidemia with target LDL less than 100 07/25/2012  . Preventative health care 07/25/2012  . Hot flash, menopausal 07/25/2012  . Chronic low back pain 07/25/2012  . Fibroids 09/02/2011  . Tobacco dependence 09/02/2011  . Hyperlipidemia 08/15/2009    Deniece Ree PT, DPT, [place]  Supplemental Physical Therapist [place]   Pager [facility] [address] [place], [place], [address] Phone: [phone]   Fax:  [phone]  Name: Roberta Clark MRN: [id number] Date of Birth: [birth year]

## 2018-01-15 ENCOUNTER — Ambulatory Visit: Payer: Medicare Other | Attending: Chiropractic Medicine | Admitting: Physical Therapy

## 2018-01-15 ENCOUNTER — Encounter: Payer: Self-pay | Admitting: Physical Therapy

## 2018-01-15 DIAGNOSIS — M25551 Pain in right hip: Secondary | ICD-10-CM | POA: Insufficient documentation

## 2018-01-15 DIAGNOSIS — M25552 Pain in left hip: Secondary | ICD-10-CM | POA: Insufficient documentation

## 2018-01-15 DIAGNOSIS — R252 Cramp and spasm: Secondary | ICD-10-CM | POA: Diagnosis present

## 2018-01-15 DIAGNOSIS — M6281 Muscle weakness (generalized): Secondary | ICD-10-CM | POA: Diagnosis present

## 2018-01-15 NOTE — Therapy (Signed)
[place], [place], [address] Phone: [phone]   Fax:  [phone]  Physical Therapy Treatment  Patient Details  Name: Roberta Clark MRN: [id number] Date of Birth: 01-27-1961 Referring Provider: Levy Pupa PA-C    Encounter Date: 01/15/2018  PT End of Session - 01/15/18 1745    Visit Number  6    Number of Visits  9    Date for PT Re-Evaluation  01/15/18    Authorization Time Period  12/18/17 to 01/18/18    PT Start Time  1645    PT Stop Time  1745    PT Time Calculation (min)  60 min    Activity Tolerance  Patient tolerated treatment well    Behavior During Therapy  [facility] for tasks assessed/performed       Past Medical History:  Diagnosis Date  . Adenomatous colon polyp 05/13/2013  . Anemia   . Arthritis   . Chronic pain   . Depression   . Diabetes mellitus without complication ([place]) 3491   Type II - diet controlled  . Family history of adverse reaction to anesthesia    mother - nausea  . History of anemia   . Hyperlipidemia 2011   Rash with statin:  not well controlled with Gemfibrozil  . Hypertension 2011  . Insomnia secondary to chronic pain   . Low back pain with left-sided sciatica    Reportedly with lumbar spinal stenosis    Past Surgical History:  Procedure Laterality Date  . BACK SURGERY    . COLONOSCOPY    . LUMBAR LAMINECTOMY/DECOMPRESSION MICRODISCECTOMY Bilateral 05/27/2016   Procedure: Left Lumbar Four-Five Laminectomy and Foraminotomy;  Surgeon: Eustace Moore, MD;  Location: [facility];  Service: Neurosurgery;  Laterality: Bilateral;  . TOTAL HIP ARTHROPLASTY Left 10/24/2016   Procedure: LEFT TOTAL HIP ARTHROPLASTY ANTERIOR APPROACH;  Surgeon: Gaynelle Arabian, MD;  [facility];  Service: Orthopedics;  Laterality: Left;  . TOTAL HIP ARTHROPLASTY Right 02/22/2017   Procedure: RIGHT TOTAL HIP ARTHROPLASTY ANTERIOR APPROACH;  Surgeon: Gaynelle Arabian, MD;  [facility];  Service:  Orthopedics;  Laterality: Right;  . TUBAL LIGATION  1985    There were no vitals filed for this visit.  Subjective Assessment - 01/15/18 1649    Subjective  Muscle relaxer now , she takes right before bed. ( cannot take during the day,  hard to walk)  Sleeping a little better with the medication.     Currently in Pain?  Yes    Pain Score  7     Pain Location  Back up to 9/10    Pain Orientation  Right;Left    Pain Descriptors / Indicators  Burning;Spasm;Stabbing;Sharp    Pain Type  Chronic pain    Pain Radiating Towards  to thighs to feet varies      Pain Frequency  Constant    Aggravating Factors   everything    Pain Relieving Factors  med for spasms    Effect of Pain on Daily Activities  severe                       [facility] Adult PT Treatment/Exercise - 01/15/18 0001      Ambulation/Gait   Gait Comments  lacked trunk rotation initially better with mirror,  cued,  also tried YOGA walking  ( Inner thigh focus)  Improved pain with rotation      Self-Care   Self-Care  Other Self-Care  Comments    Other Self-Care Comments   scarm  hip surgery,  Things to avoid with hip with anterior approach,  how to stretch a cramp.      Lumbar Exercises: Stretches   Passive Hamstring Stretch  3 reps;10 seconds small stretches tolerated     Single Knee to Chest Stretch  3 reps;10 seconds ROM guided by patient's pain.     Hip Flexor Stretch  2 reps;30 seconds    Gastroc Stretch  3 reps;30 seconds HEP    Other Lumbar Stretch Exercise  Flossing bilateral LE in supine with head movements.  10 x each side      Lumbar Exercises: Supine   Other Supine Lumbar Exercises  gluteal set 5 X 10 to 5  seconds      Knee/Hip Exercises: Stretches   Gastroc Stretch  3 reps;30 seconds tight,  both    Other Knee/Hip Stretches  modified QL stretch as tolerated x3 10 second holds  tender      Modalities   Modalities  Moist Heat      Moist Heat Therapy   Number Minutes Moist Heat  15 Minutes     Moist Heat Location  Knee;Lumbar Spine B quads             PT Education - 01/15/18 1743    Education provided  Yes    Education Details  HEP,  self care    Methods  Explanation;Demonstration;Tactile cues;Verbal cues;Handout    Comprehension  Verbalized understanding;Returned demonstration       PT Short Term Goals - 01/15/18 1756      PT SHORT TERM GOAL #1   Title  Patient to be independent with appropriate HEP, to be updated PRN     Baseline  independent as able    Period  Weeks    Status  Achieved        PT Long Term Goals - 12/18/17 1253      PT LONG TERM GOAL #1   Title  Paitent to demonstrate resolution of muscle flexibility deficits in order to assist in reducing pain and improving overall mobility     Time  4    Period  Weeks    Status  New    Target Date  01/15/18      PT LONG TERM GOAL #2   Title  Patient to report pain in bilateral hips an back as being no more than 3/10 at worst to improve QOL and tolerance to functional activities     Time  4    Period  Weeks    Status  New      PT LONG TERM GOAL #3   Title  Patient to demonstrate functional muscle strength as being 5/5 in order to assist in reducing pain and improving mobility     Time  4    Period  Weeks    Status  New      PT LONG TERM GOAL #4   Title  Patient to report resolution of symptoms running down anterior LEs in order to show improvement of condition     Time  4    Period  Weeks    Status  New            Plan - 01/15/18 1750    Clinical Impression Statement  Pain decreased.   5/10 prior to moist heat at end of session.  Pain reduced with increasing trunk rotation with gait ,  nerve flossing,  STG #1 met  for HEP. Self care was educational especially with scar tissue  feeling like burning  as she straightens out after sleeping from flexed position.  this can be anormal response.  She did report a fall on steps on Mother's day which may have helped her pain a little. She did not  report this to anyone. except grandson.    PT Next Visit Plan  Continue calf and all stretches.  Check gait for rotation,  Consider neural flossing in supine with slight Hamstring stretch.  She needs reassurance that what she is doing will not harm her and she needs to understand why she is doing something and how it will help and that it may hurt not harm.  (I am sure you are already doing)    PT Home Exercise Plan  Eval: quad stretches, TA activation, desensitization techniques/scar massage, hip flexor stretch calf stretch       Patient will benefit from skilled therapeutic intervention in order to improve the following deficits and impairments:     Visit Diagnosis: Pain in left hip  Pain in right hip  Cramp and spasm  Muscle weakness (generalized)     Problem List Patient Active Problem List   Diagnosis Date Noted  . Avascular necrosis of femoral head, right ([place]) 02/22/2017  . Avascular necrosis of femoral head, left ([place]) 10/24/2016  . OA (osteoarthritis) of hip 10/24/2016  . S/P lumbar laminectomy 05/27/2016  . Type 2 diabetes mellitus without complication, without long-term current use of insulin ([place]) 11/03/2015  . Lumbar radiculopathy, chronic 06/14/2013  . Adenomatous colon polyp 05/13/2013  . Vaginal discharge 08/22/2012  . Hypertension 07/25/2012  . Hyperlipidemia with target LDL less than 100 07/25/2012  . Preventative health care 07/25/2012  . Hot flash, menopausal 07/25/2012  . Chronic low back pain 07/25/2012  . Fibroids 09/02/2011  . Tobacco dependence 09/02/2011  . Hyperlipidemia 08/15/2009    HARRIS,KAREN PTA 01/15/2018, 5:58 PM  [facility] [address] [place], [place], [address] Phone: [phone]   Fax:  [phone]  Name: Roberta Clark MRN: [id number] Date of Birth: [birth year]

## 2018-01-15 NOTE — Patient Instructions (Signed)
Achilles / Gastroc, Standing    Stand, right foot behind, heel on floor and turned slightly out, leg straight, forward leg bent. Move hips forward. Hold _30__ seconds. Repeat _3__ times per session. Do _1-2__ sessions per day.   Keep knee slightly bent.  Copyright  VHI. All rights reserved.  [employer]

## 2018-01-17 ENCOUNTER — Ambulatory Visit: Payer: Self-pay | Admitting: Physical Therapy

## 2018-01-22 ENCOUNTER — Ambulatory Visit: Payer: Medicare Other | Admitting: Physical Therapy

## 2018-01-24 ENCOUNTER — Ambulatory Visit: Payer: Medicare Other | Admitting: Physical Therapy

## 2018-01-24 ENCOUNTER — Encounter: Payer: Self-pay | Admitting: Physical Therapy

## 2018-01-24 DIAGNOSIS — M25552 Pain in left hip: Secondary | ICD-10-CM | POA: Diagnosis not present

## 2018-01-24 DIAGNOSIS — R252 Cramp and spasm: Secondary | ICD-10-CM

## 2018-01-24 DIAGNOSIS — M25551 Pain in right hip: Secondary | ICD-10-CM

## 2018-01-24 DIAGNOSIS — M6281 Muscle weakness (generalized): Secondary | ICD-10-CM

## 2018-01-24 NOTE — Therapy (Signed)
[place], [place], [address] Phone: [phone]   Fax:  [phone]  Physical Therapy Treatment (DC)  Patient Details  Name: Roberta Clark MRN: [id number] Date of Birth: [birth year] Referring Provider: Levy Pupa    Encounter Date: 01/24/2018   PHYSICAL THERAPY DISCHARGE SUMMARY  Visits from Start of Care: 7  Current functional level related to goals / functional outcomes: Patient does not appear to be making significant progress with skilled PT services, recommend return to MD for further F/U regarding pain. DC today.    Remaining deficits: See below    Education / Equipment: See below  Plan: Patient agrees to discharge.  Patient goals were not met. Patient is being discharged due to lack of progress.  ?????       PT End of Session - 01/24/18 1716    Visit Number  7    Number of Visits  7    Authorization Type  Medicare/Medicaid (PN due 10th note)    Authorization Time Period  12/18/17 to 01/18/18    PT Start Time  1631    PT Stop Time  1654    PT Time Calculation (min)  23 min    Activity Tolerance  Patient tolerated treatment well    Behavior During Therapy  [facility] for tasks assessed/performed       Past Medical History:  Diagnosis Date  . Adenomatous colon polyp 05/13/2013  . Anemia   . Arthritis   . Chronic pain   . Depression   . Diabetes mellitus without complication ([place]) 1448   Type II - diet controlled  . Family history of adverse reaction to anesthesia    mother - nausea  . History of anemia   . Hyperlipidemia 2011   Rash with statin:  not well controlled with Gemfibrozil  . Hypertension 2011  . Insomnia secondary to chronic pain   . Low back pain with left-sided sciatica    Reportedly with lumbar spinal stenosis    Past Surgical History:  Procedure Laterality Date  . BACK SURGERY    . COLONOSCOPY    . LUMBAR LAMINECTOMY/DECOMPRESSION MICRODISCECTOMY Bilateral 05/27/2016    Procedure: Left Lumbar Four-Five Laminectomy and Foraminotomy;  Surgeon: Eustace Moore, MD;  Location: [facility];  Service: Neurosurgery;  Laterality: Bilateral;  . TOTAL HIP ARTHROPLASTY Left 10/24/2016   Procedure: LEFT TOTAL HIP ARTHROPLASTY ANTERIOR APPROACH;  Surgeon: Gaynelle Arabian, MD;  Location: [facility];  Service: Orthopedics;  Laterality: Left;  . TOTAL HIP ARTHROPLASTY Right 02/22/2017   Procedure: RIGHT TOTAL HIP ARTHROPLASTY ANTERIOR APPROACH;  Surgeon: Gaynelle Arabian, MD;  Location: [facility];  Service: Orthopedics;  Laterality: Right;  . TUBAL LIGATION  1985    There were no vitals filed for this visit.  Subjective Assessment - 01/24/18 1634    Subjective  I am not feeling good today, my son is still not doing well. I feel like I'm getting better with PT. Some of the exercises last time helped a lot. Sleeping is still difficult, laying on  my sides is hard as well.  No falls since last time.     How long can you sit comfortably?  6/12- less than 5 minutes     How long can you stand comfortably?  6/12- 5 minutes     How long can you walk comfortably?  6/12- do well with walking unless I do too much     Patient Stated Goals  get rid of pain  as much as possible, get back to regular activities     Currently in Pain?  Yes    Pain Score  9     Pain Location  Other (Comment) back and both hips, groin     Pain Orientation  Right;Left    Pain Descriptors / Indicators  Throbbing;Stabbing;Other (Comment) feels like it wants to pop in both hips     Pain Type  Chronic pain    Pain Radiating Towards  radiates down to feet     Pain Onset  More than a month ago    Pain Frequency  Constant    Aggravating Factors   everything     Pain Relieving Factors  heat    Effect of Pain on Daily Activities  severe         [facility] PT Assessment - 01/24/18 0001      Assessment   Medical Diagnosis  bilateral hip pain s/p anterior THAs     Referring Provider  Levy Pupa     Onset Date/Surgical Date  --  2018    Next MD Visit  Dr. Wynelle Link in July, otherwise after PT is done     Prior Therapy  PT at [facility] following both surgeries       Precautions   Precautions  None    Precaution Comments  B anterior THAs done in March and July of 2018       Restrictions   Weight Bearing Restrictions  No      Balance Screen   Has the patient fallen in the past 6 months  Yes    How many times?  1    Has the patient had a decrease in activity level because of a fear of falling?   Yes    Is the patient reluctant to leave their home because of a fear of falling?   No      Prior Function   Level of Independence  Independent;Independent with basic ADLs;Independent with gait;Independent with transfers    Vocation  Part time employment    Vocation Requirements  works in [employer]     Leisure  hanging out with grandkids, walking for exercise       AROM   Lumbar Flexion  about 40% limited     Lumbar Extension  mild limitation but painful     Lumbar - Right Side Bend  WFL     Lumbar - Left Side Bend  [facility]       Strength   Right Hip Flexion  4/5    Right Hip Extension  3+/5    Right Hip ABduction  5/5    Left Hip Flexion  4+/5    Left Hip Extension  3+/5    Left Hip ABduction  4+/5    Right Knee Flexion  4+/5    Right Knee Extension  5/5    Left Knee Flexion  4+/5    Left Knee Extension  5/5      Flexibility   Hamstrings  severe limitation and guarding B     Quadriceps  moderate tightness B     Piriformis  very mild tightness B       Palpation   Palpation comment  severe spasm B lumbar paraspinals, TTP B glutes; incision on L hip with moderate fasical restrictions  PT Education - 01/24/18 1716    Education provided  Yes    Education Details  DC today, may return to PT with new issue with new MD order, F/U with MDs regarding spine stimulator     Person(s) Educated  Patient    Methods  Explanation    Comprehension  Verbalized  understanding       PT Short Term Goals - 01/24/18 1650      PT SHORT TERM GOAL #1   Title  Patient to be independent with appropriate HEP, to be updated PRN     Baseline  6/12- compliant excpet for during recent family emergency     Time  1    Period  Weeks    Status  Achieved        PT Long Term Goals - 01/24/18 1650      PT LONG TERM GOAL #1   Title  Paitent to demonstrate resolution of muscle flexibility deficits in order to assist in reducing pain and improving overall mobility     Baseline  6/12- remains guarded and tight     Time  4    Period  Weeks    Status  Not Met      PT LONG TERM GOAL #2   Title  Patient to report pain in bilateral hips an back as being no more than 3/10 at worst to improve QOL and tolerance to functional activities     Baseline  6/12- 9/10     Time  4    Period  Weeks    Status  Not Met      PT LONG TERM GOAL #3   Title  Patient to demonstrate functional muscle strength as being 5/5 in order to assist in reducing pain and improving mobility     Baseline  6/12- flowsheets, not met     Time  4    Period  Weeks    Status  Not Met      PT LONG TERM GOAL #4   Title  Patient to report resolution of symptoms running down anterior LEs in order to show improvement of condition     Baseline  6/12- no change     Time  4    Period  Weeks    Status  Not Met            Plan - 01/24/18 1716    Clinical Impression Statement  Re-assessment performed today. Patient does not appear to be making significant improvement with skilled PT services, as objective measures and FOTO score are the same or lower than at initial evaluation. Continue to suspect that patient's primary symptoms of complaint originate from her lumbar spine rather than her hips. She reports that she has a scheduled appointment regarding spinal cord stimulator to help with her pain next week, encouraged patient to attend and follow up with this visit/MD. Discussed discharge from PT  today as well as potential to return if she does receive stimulator and pain becomes more controlled, patient agreeable. DC today due to lack of progress.     Rehab Potential  Good    PT Treatment/Interventions  ADLs/Self Care Home Management;Cryotherapy;Biofeedback;Electrical Stimulation;Iontophoresis 43m/ml Dexamethasone;Moist Heat;Ultrasound;Functional mobility training;Therapeutic activities;Therapeutic exercise;Balance training;Neuromuscular re-education;Patient/family education;Manual techniques;Scar mobilization;Passive range of motion;Dry needling;Taping;Energy conservation    PT Next Visit Plan  DC today     PT Home Exercise Plan  Eval: quad stretches, TA activation, desensitization techniques/scar massage, hip flexor stretch calf stretch  Consulted and Agree with Plan of Care  Patient       Patient will benefit from skilled therapeutic intervention in order to improve the following deficits and impairments:  Increased fascial restricitons, Improper body mechanics, Pain, Decreased coordination, Decreased mobility, Decreased scar mobility, Increased muscle spasms, Postural dysfunction, Decreased range of motion, Decreased strength, Hypomobility, Difficulty walking, Impaired flexibility  Visit Diagnosis: Pain in left hip - Plan: PT plan of care cert/re-cert  Pain in right hip - Plan: PT plan of care cert/re-cert  Cramp and spasm - Plan: PT plan of care cert/re-cert  Muscle weakness (generalized) - Plan: PT plan of care cert/re-cert     Problem List Patient Active Problem List   Diagnosis Date Noted  . Avascular necrosis of femoral head, right ([place]) 02/22/2017  . Avascular necrosis of femoral head, left ([place]) 10/24/2016  . OA (osteoarthritis) of hip 10/24/2016  . S/P lumbar laminectomy 05/27/2016  . Type 2 diabetes mellitus without complication, without long-term current use of insulin ([place]) 11/03/2015  . Lumbar radiculopathy, chronic 06/14/2013  . Adenomatous colon polyp  05/13/2013  . Vaginal discharge 08/22/2012  . Hypertension 07/25/2012  . Hyperlipidemia with target LDL less than 100 07/25/2012  . Preventative health care 07/25/2012  . Hot flash, menopausal 07/25/2012  . Chronic low back pain 07/25/2012  . Fibroids 09/02/2011  . Tobacco dependence 09/02/2011  . Hyperlipidemia 08/15/2009    Deniece Ree PT, DPT, [place]  Supplemental Physical Therapist [place]   Pager [place] [facility] [address] [place], [place], [address] Phone: [phone]   Fax:  [phone]  Name: Roberta Clark MRN: [id number] Date of Birth: Sep 19, 1960

## 2018-01-30 ENCOUNTER — Telehealth: Payer: Self-pay | Admitting: Internal Medicine

## 2018-01-30 NOTE — Telephone Encounter (Signed)
Evaluated by psychology at [facility] pain and Neuropsychology for candidacy for implantable nerve stimulator. Felt to have a high level of psychosocial influence in her pain syndrome that warrants trial deferment.   She needs treatment to address depression, anxiety and associated somatization in the meantime. Has appt on June 28th already scheduled.

## 2018-02-09 ENCOUNTER — Encounter: Payer: Self-pay | Admitting: Internal Medicine

## 2018-02-09 ENCOUNTER — Ambulatory Visit: Payer: Medicare Other | Admitting: Internal Medicine

## 2018-02-09 VITALS — BP 120/88 | HR 86 | Resp 12 | Ht 62.0 in | Wt 113.0 lb

## 2018-02-09 DIAGNOSIS — E782 Mixed hyperlipidemia: Secondary | ICD-10-CM

## 2018-02-09 DIAGNOSIS — I10 Essential (primary) hypertension: Secondary | ICD-10-CM | POA: Diagnosis not present

## 2018-02-09 DIAGNOSIS — F329 Major depressive disorder, single episode, unspecified: Secondary | ICD-10-CM

## 2018-02-09 DIAGNOSIS — M5442 Lumbago with sciatica, left side: Secondary | ICD-10-CM | POA: Diagnosis not present

## 2018-02-09 DIAGNOSIS — E119 Type 2 diabetes mellitus without complications: Secondary | ICD-10-CM

## 2018-02-09 DIAGNOSIS — K635 Polyp of colon: Secondary | ICD-10-CM

## 2018-02-09 DIAGNOSIS — F32A Depression, unspecified: Secondary | ICD-10-CM

## 2018-02-09 DIAGNOSIS — F419 Anxiety disorder, unspecified: Secondary | ICD-10-CM

## 2018-02-09 DIAGNOSIS — G8929 Other chronic pain: Secondary | ICD-10-CM

## 2018-02-09 LAB — GLUCOSE, POCT (MANUAL RESULT ENTRY): POC GLUCOSE: 147 mg/dL — AB (ref 70–99)

## 2018-02-09 MED ORDER — FLUOXETINE HCL 40 MG PO CAPS: ORAL_CAPSULE | ORAL | 11 refills | Status: DC

## 2018-02-09 NOTE — Progress Notes (Signed)
Subjective:    Patient ID: Roberta Clark, female    DOB: July 14, 1961, 57 y.o.   MRN: [id number]  HPI   1.  Chronic pain with hip and low back:  Has had multiple surgeries.  Was evaluated by Pain clinic and discussing implantable nerve stimulator.  2.  Depression/Anxiety:  Does have anxiety regarding activities she is concerned she will fall with.  Baths.  Has always had anxiety around water.  Not taking her baths.    3.  Hyperlipidemia:  Has not been able to get more physically active.    4.  Essential Hypertension: States has been taking Lisinopril/HCTZ .  5.  Colonoscopy:  Have not been able to get patient into [facility] for follow up of dysplastic sessile transverse colon polyp in 2014 with [facility].  She was not able to go to [facility] to sign a release for the records, even though the records are in [place].   Will send to [facility].  Current Meds  Medication Sig  . amitriptyline (ELAVIL) 25 MG tablet TAKE ONE TABLET BY MOUTH AT BEDTIME AS NEEDED FOR SLEEP  . FLUoxetine (PROZAC) 40 MG capsule 1 tab by mouth daily  . ibuprofen (ADVIL,MOTRIN) 800 MG tablet TAKE ONE TABLET BY MOUTH TWICE DAILY  . lisinopril-hydrochlorothiazide (PRINZIDE,ZESTORETIC) 20-12.5 MG tablet TAKE 1 TABLET BY MOUTH ONCE DAILY  . Melatonin 3 MG SUBL Place 6 mg under the tongue at bedtime.  . meloxicam (MOBIC) 7.5 MG tablet Take 7.5 mg by mouth daily. 2 tablets once a day  . methocarbamol (ROBAXIN) 500 MG tablet TAKE 1 TABLET BY MOUTH EVERY 8 HOURS AS NEEDED FOR  SPASMS  . tiZANidine (ZANAFLEX) 4 MG tablet Take 4 mg by mouth every 8 (eight) hours as needed for muscle spasms.  . [DISCONTINUED] FLUoxetine (PROZAC) 20 MG capsule Take 1 capsule (20 mg total) by mouth daily.  . [DISCONTINUED] gabapentin (NEURONTIN) 600 MG tablet 1 tab by mouth in morning and midday and 2 tabs by mouth at bedtime  . [DISCONTINUED] gemfibrozil (LOPID) 600 MG tablet TAKE ONE TABLET BY MOUTH TWICE DAILY WITH MEALS  . [DISCONTINUED]  KLOR-CON M10 10 MEQ tablet TAKE ONE TABLET BY MOUTH ONCE DAILY    Allergies  Allergen Reactions  . Metformin And Related Nausea And Vomiting  . Lipitor [Atorvastatin] Rash      Review of Systems     Objective:   Physical Exam NAD Moves smoothly and easily to exam bed Lungs:  CTA CV:  RRR without murmur or rub, radial and DP pulses normal and equal Abd:  S NT, No HSM or mass, + BS LE:  No edema. Feet:  No lesions       Assessment & Plan:  1.  DM: Chronic back and hip pain following surgeries and rehab.  Concern for Depression and Anxiety as adding to this.  Psych evaluation  2.  Tobacco Abuse:  Encouraged nicotine gum or lozenges.  Quitline info given.  3. Depression/Anxiety:  Refill of Fluoxetine 40 mg daily.   4.  Chronic recurrent low back pain now with left sided sciatica:  As per pain clinic.  Plans for implantable nerve stimulator.  5.  Hypertension:  Controlled  6.  Dysplastic colon polyp history:  Referral to [facility] as well.  7.  Hyperlipidemia:  Has not been able to get mobilized with her MS concerns.  Encouraged having look into [facility] for physical activity.  Not clear she is motivated with all she  is dealing with.  Follow up in 1 month for fasting labs:  FLP, A1C, CBC, CMP, urine microalbumin/crea.

## 2018-02-09 NOTE — Patient Instructions (Signed)
Go to [facility] and see about beginner swimming lessons --or [employer] and ask for scholarship

## 2018-03-08 ENCOUNTER — Other Ambulatory Visit: Payer: Medicare Other | Admitting: Licensed Clinical Social Worker

## 2018-03-13 ENCOUNTER — Other Ambulatory Visit (INDEPENDENT_AMBULATORY_CARE_PROVIDER_SITE_OTHER): Payer: Medicare Other

## 2018-03-13 DIAGNOSIS — E119 Type 2 diabetes mellitus without complications: Secondary | ICD-10-CM | POA: Diagnosis not present

## 2018-03-13 DIAGNOSIS — Z79899 Other long term (current) drug therapy: Secondary | ICD-10-CM

## 2018-03-13 DIAGNOSIS — E782 Mixed hyperlipidemia: Secondary | ICD-10-CM

## 2018-03-14 LAB — COMPREHENSIVE METABOLIC PANEL
ALBUMIN: 4.6 g/dL (ref 3.5–5.5)
ALK PHOS: 76 IU/L (ref 39–117)
ALT: 13 IU/L (ref 0–32)
AST: 14 IU/L (ref 0–40)
Albumin/Globulin Ratio: 1.8 (ref 1.2–2.2)
BUN/Creatinine Ratio: 24 — ABNORMAL HIGH (ref 9–23)
BUN: 31 mg/dL — AB (ref 6–24)
CHLORIDE: 102 mmol/L (ref 96–106)
CO2: 23 mmol/L (ref 20–29)
Calcium: 10.1 mg/dL (ref 8.7–10.2)
Creatinine, Ser: 1.29 mg/dL — ABNORMAL HIGH (ref 0.57–1.00)
GFR calc non Af Amer: 46 mL/min/{1.73_m2} — ABNORMAL LOW (ref >59)
GFR, EST AFRICAN AMERICAN: 53 mL/min/{1.73_m2} — AB (ref >59)
GLUCOSE: 133 mg/dL — AB (ref 65–99)
Globulin, Total: 2.6 g/dL (ref 1.5–4.5)
POTASSIUM: 4.8 mmol/L (ref 3.5–5.2)
Sodium: 142 mmol/L (ref 134–144)
Total Protein: 7.2 g/dL (ref 6.0–8.5)

## 2018-03-14 LAB — CBC WITH DIFFERENTIAL/PLATELET
BASOS ABS: 0.1 10*3/uL (ref 0.0–0.2)
Basos: 1 %
EOS (ABSOLUTE): 0.7 10*3/uL — AB (ref 0.0–0.4)
Eos: 7 %
HEMOGLOBIN: 13.6 g/dL (ref 11.1–15.9)
Hematocrit: 42.7 % (ref 34.0–46.6)
Immature Grans (Abs): 0 10*3/uL (ref 0.0–0.1)
Immature Granulocytes: 0 %
LYMPHS ABS: 4.1 10*3/uL — AB (ref 0.7–3.1)
Lymphs: 41 %
MCH: 30.1 pg (ref 26.6–33.0)
MCHC: 31.9 g/dL (ref 31.5–35.7)
MCV: 95 fL (ref 79–97)
MONOS ABS: 0.7 10*3/uL (ref 0.1–0.9)
Monocytes: 7 %
NEUTROS ABS: 4.5 10*3/uL (ref 1.4–7.0)
Neutrophils: 44 %
PLATELETS: 361 10*3/uL (ref 150–450)
RBC: 4.52 x10E6/uL (ref 3.77–5.28)
RDW: 13 % (ref 12.3–15.4)
WBC: 10.1 10*3/uL (ref 3.4–10.8)

## 2018-03-14 LAB — MICROALBUMIN / CREATININE URINE RATIO
Creatinine, Urine: 83.8 mg/dL
MICROALB/CREAT RATIO: 36.2 mg/g{creat} — AB (ref 0.0–30.0)
Microalbumin, Urine: 30.3 ug/mL

## 2018-03-14 LAB — LIPID PANEL W/O CHOL/HDL RATIO
Cholesterol, Total: 290 mg/dL — ABNORMAL HIGH (ref 100–199)
HDL: 47 mg/dL (ref >39)
LDL CALC: 223 mg/dL — AB (ref 0–99)
TRIGLYCERIDES: 102 mg/dL (ref 0–149)
VLDL Cholesterol Cal: 20 mg/dL (ref 5–40)

## 2018-03-14 LAB — HGB A1C W/O EAG: Hgb A1c MFr Bld: 7.3 % — ABNORMAL HIGH (ref 4.8–5.6)

## 2018-03-16 ENCOUNTER — Ambulatory Visit: Payer: Medicare Other | Admitting: Internal Medicine

## 2018-03-26 MED ORDER — GLIPIZIDE 5 MG PO TABS: ORAL_TABLET | ORAL | 11 refills | Status: DC

## 2018-03-26 NOTE — Addendum Note (Signed)
Addended by: Marcelino Duster on: 03/26/2018 05:37 AM   Modules accepted: Orders

## 2018-03-30 ENCOUNTER — Other Ambulatory Visit: Payer: Self-pay

## 2018-03-30 MED ORDER — GLIPIZIDE 5 MG PO TABS: ORAL_TABLET | ORAL | 11 refills | Status: DC

## 2018-04-10 ENCOUNTER — Other Ambulatory Visit: Payer: Self-pay | Admitting: Internal Medicine

## 2018-04-17 ENCOUNTER — Ambulatory Visit (INDEPENDENT_AMBULATORY_CARE_PROVIDER_SITE_OTHER): Payer: Medicare Other | Admitting: Internal Medicine

## 2018-04-17 ENCOUNTER — Encounter: Payer: Self-pay | Admitting: Internal Medicine

## 2018-04-17 VITALS — BP 130/88 | HR 74 | Resp 12 | Ht 62.0 in | Wt 117.0 lb

## 2018-04-17 DIAGNOSIS — E119 Type 2 diabetes mellitus without complications: Secondary | ICD-10-CM

## 2018-04-17 DIAGNOSIS — R7989 Other specified abnormal findings of blood chemistry: Secondary | ICD-10-CM | POA: Diagnosis not present

## 2018-04-17 DIAGNOSIS — E782 Mixed hyperlipidemia: Secondary | ICD-10-CM | POA: Diagnosis not present

## 2018-04-17 LAB — GLUCOSE, POCT (MANUAL RESULT ENTRY): POC GLUCOSE: 87 mg/dL (ref 70–99)

## 2018-04-17 MED ORDER — GLUCOSE BLOOD VI STRP: ORAL_STRIP | 12 refills | Status: DC

## 2018-04-17 MED ORDER — ONETOUCH ULTRASOFT LANCETS MISC: 12 refills | Status: DC

## 2018-04-17 MED ORDER — ROSUVASTATIN CALCIUM 10 MG PO TABS: 10.0000 mg | ORAL_TABLET | Freq: Every day | ORAL | 11 refills | Status: DC

## 2018-04-17 MED ORDER — ONETOUCH VERIO W/DEVICE KIT: PACK | 0 refills | Status: DC

## 2018-04-17 NOTE — Progress Notes (Signed)
   Subjective:    Patient ID: Roberta Clark, female    DOB: [birth year], 57 y.o.   MRN: [id number]  HPI  1.  Hyperlipidemia:  Discussed Lipid panel from July.  Was not able to keep her follow up appt.  2.  DM:  Started Glipizide end of July/end of August.  Not checking sugars at all, so not clear what her sugars are doing.  She always is thirsty. She does drink juice and ginger ale.  3.  New pain :  Low back and into her buttocks and wrapping around thighs to groin.  Was sitting on a hard chair 3 days ago for a prolonged period of time. Pain started yesterday.    4.  Increased creatinine with last BMP in July.  She has been drinking fluids today.  Was fasting with labs in July.   Review of Systems  Current Meds  Medication Sig  . amitriptyline (ELAVIL) 25 MG tablet TAKE ONE TABLET BY MOUTH AT BEDTIME AS NEEDED FOR SLEEP  . FLUoxetine (PROZAC) 40 MG capsule 1 tab by mouth daily  . gabapentin (NEURONTIN) 600 MG tablet TAKE ONE TABLET BY MOUTH EVERY MORNING AND AT MIDDAY,AND THEN TAKE TWO TABLETS BY MOUTH AT BEDTIME  . gemfibrozil (LOPID) 600 MG tablet TAKE ONE TABLET BY MOUTH TWICE DAILY WITH MEALS  . glipiZIDE (GLUCOTROL) 5 MG tablet 1/2 tab by mouth twice daily with meals  . ibuprofen (ADVIL,MOTRIN) 800 MG tablet TAKE ONE TABLET BY MOUTH TWICE DAILY  . KLOR-CON M10 10 MEQ tablet TAKE ONE TABLET BY MOUTH ONCE DAILY  . lisinopril-hydrochlorothiazide (PRINZIDE,ZESTORETIC) 20-12.5 MG tablet TAKE 1 TABLET BY MOUTH ONCE DAILY  . Melatonin 3 MG SUBL Place 6 mg under the tongue at bedtime.  . meloxicam (MOBIC) 7.5 MG tablet Take 7.5 mg by mouth daily. 2 tablets once a day  . Omega-3 Fatty Acids (FISH OIL) 1000 MG CAPS Take 1 capsule by mouth 2 (two) times daily.  Marland Kitchen tiZANidine (ZANAFLEX) 4 MG tablet Take 4 mg by mouth every 8 (eight) hours as needed for muscle spasms.    Allergies  Allergen Reactions  . Metformin And Related Nausea And Vomiting  . Lipitor [Atorvastatin] Rash      Objective:     Physical Exam NAD Lungs:  CTA CV:  RRR without murmur or rub.  Radial and DP pulses normal and equal. Abd:  S, NT, No HSM, or masses.   LE:  No edema.       Assessment & Plan:  1.  Hyperlipidemia:  Start Rosuvastatin and see if tolerates this statin.10 mg daily.  Repeat FLP, CMP in 6 weeks Despite all of ortho surgery in past 2 years, has not been able to get enough pain relief to get more physically active. Encouraged still getting into a pool regularly if possible.  2.  DM:  Rx for new monitoring equipment.  To avoid sweetened drinks.  3.  Low back pain:  Working with ortho and pain clinic.  Discussed needs to avoid staying in positions that will more likely exacerbate her pain.   4.  Elevated Creatinine:  Repeat BMP.  Likely due to dehydration previously.  5.  HM:  Recommended flu vaccine with upcoming flu clinics

## 2018-04-17 NOTE — Patient Instructions (Signed)
Do not take ibuprofen if you are taking Meloxicam Get off butt and recline to let bursitis heal

## 2018-04-18 ENCOUNTER — Telehealth: Payer: Self-pay | Admitting: Internal Medicine

## 2018-04-18 LAB — BASIC METABOLIC PANEL
BUN/Creatinine Ratio: 23 (ref 9–23)
BUN: 18 mg/dL (ref 6–24)
CALCIUM: 9.8 mg/dL (ref 8.7–10.2)
CHLORIDE: 104 mmol/L (ref 96–106)
CO2: 23 mmol/L (ref 20–29)
Creatinine, Ser: 0.78 mg/dL (ref 0.57–1.00)
GFR calc non Af Amer: 84 mL/min/{1.73_m2} (ref >59)
GFR, EST AFRICAN AMERICAN: 97 mL/min/{1.73_m2} (ref >59)
GLUCOSE: 68 mg/dL (ref 65–99)
POTASSIUM: 4.4 mmol/L (ref 3.5–5.2)
Sodium: 144 mmol/L (ref 134–144)

## 2018-04-18 NOTE — Telephone Encounter (Signed)
Patient called Blood Glucose Monitoring Supplies can not be ordered from [facility].  Insurance will not cover.  Please transfer over to [facility] on [address].  Also, will send fax to Dr. Amil Amen regarding patient's Crestor.  Patient can be reached at [phone].

## 2018-04-19 ENCOUNTER — Other Ambulatory Visit: Payer: Self-pay

## 2018-04-19 MED ORDER — GLUCOSE BLOOD VI STRP: ORAL_STRIP | 12 refills | Status: DC

## 2018-04-19 MED ORDER — ONETOUCH VERIO W/DEVICE KIT: PACK | 0 refills | Status: DC

## 2018-04-19 MED ORDER — ONETOUCH ULTRASOFT LANCETS MISC: 12 refills | Status: DC

## 2018-04-19 NOTE — Telephone Encounter (Signed)
Rx for glucometer sent to [facility] and prior auth initiated for Crestor. Waiting for response from insurance.

## 2018-05-04 ENCOUNTER — Telehealth: Payer: Self-pay

## 2018-05-04 NOTE — Telephone Encounter (Signed)
Spoke with Dr. Kathline Magic office. States they have contacted the patient twice to set up appointment with no response from patient. They called patient on 04/20/18 and 04/30/18. At this time the will reach out one more time and would also like for patient to call them to schedule appointment if possible.  Left detailed message for patient informing her they have been reaching out to schedule appointment and also left phone number to Dr. Kathline Magic office so she can call and set up appointment.

## 2018-05-08 ENCOUNTER — Other Ambulatory Visit: Payer: Self-pay

## 2018-05-08 MED ORDER — POTASSIUM CHLORIDE CRYS ER 10 MEQ PO TBCR: 10.0000 meq | EXTENDED_RELEASE_TABLET | Freq: Every day | ORAL | 11 refills | Status: DC

## 2018-05-29 ENCOUNTER — Other Ambulatory Visit: Payer: Medicare Other

## 2018-05-31 ENCOUNTER — Other Ambulatory Visit (INDEPENDENT_AMBULATORY_CARE_PROVIDER_SITE_OTHER): Payer: Medicare Other

## 2018-05-31 DIAGNOSIS — E119 Type 2 diabetes mellitus without complications: Secondary | ICD-10-CM | POA: Diagnosis not present

## 2018-05-31 DIAGNOSIS — Z79899 Other long term (current) drug therapy: Secondary | ICD-10-CM

## 2018-05-31 DIAGNOSIS — E782 Mixed hyperlipidemia: Secondary | ICD-10-CM

## 2018-06-01 LAB — COMPREHENSIVE METABOLIC PANEL
A/G RATIO: 2.1 (ref 1.2–2.2)
ALBUMIN: 4.6 g/dL (ref 3.5–5.5)
ALT: 18 IU/L (ref 0–32)
AST: 17 IU/L (ref 0–40)
Alkaline Phosphatase: 67 IU/L (ref 39–117)
BUN / CREAT RATIO: 31 — AB (ref 9–23)
BUN: 23 mg/dL (ref 6–24)
CALCIUM: 9.7 mg/dL (ref 8.7–10.2)
CHLORIDE: 103 mmol/L (ref 96–106)
CO2: 24 mmol/L (ref 20–29)
Creatinine, Ser: 0.75 mg/dL (ref 0.57–1.00)
GFR calc non Af Amer: 88 mL/min/{1.73_m2} (ref >59)
GFR, EST AFRICAN AMERICAN: 102 mL/min/{1.73_m2} (ref >59)
GLOBULIN, TOTAL: 2.2 g/dL (ref 1.5–4.5)
Glucose: 101 mg/dL — ABNORMAL HIGH (ref 65–99)
Potassium: 4.4 mmol/L (ref 3.5–5.2)
SODIUM: 143 mmol/L (ref 134–144)
TOTAL PROTEIN: 6.8 g/dL (ref 6.0–8.5)

## 2018-06-01 LAB — LIPID PANEL W/O CHOL/HDL RATIO
Cholesterol, Total: 171 mg/dL (ref 100–199)
HDL: 50 mg/dL (ref >39)
LDL Calculated: 112 mg/dL — ABNORMAL HIGH (ref 0–99)
Triglycerides: 46 mg/dL (ref 0–149)
VLDL Cholesterol Cal: 9 mg/dL (ref 5–40)

## 2018-06-01 LAB — HGB A1C W/O EAG: HEMOGLOBIN A1C: 6.4 % — AB (ref 4.8–5.6)

## 2018-06-04 ENCOUNTER — Ambulatory Visit: Payer: Medicare Other | Admitting: Internal Medicine

## 2018-06-05 ENCOUNTER — Ambulatory Visit (INDEPENDENT_AMBULATORY_CARE_PROVIDER_SITE_OTHER): Payer: Medicare Other | Admitting: Internal Medicine

## 2018-06-05 ENCOUNTER — Encounter: Payer: Self-pay | Admitting: Internal Medicine

## 2018-06-05 VITALS — BP 122/80 | HR 74 | Resp 12 | Ht 62.0 in | Wt 114.0 lb

## 2018-06-05 DIAGNOSIS — I10 Essential (primary) hypertension: Secondary | ICD-10-CM | POA: Diagnosis not present

## 2018-06-05 DIAGNOSIS — E119 Type 2 diabetes mellitus without complications: Secondary | ICD-10-CM | POA: Diagnosis not present

## 2018-06-05 DIAGNOSIS — E782 Mixed hyperlipidemia: Secondary | ICD-10-CM | POA: Diagnosis not present

## 2018-06-05 LAB — GLUCOSE, POCT (MANUAL RESULT ENTRY): POC GLUCOSE: 95 mg/dL (ref 70–99)

## 2018-06-05 MED ORDER — GLUCOSE BLOOD VI STRP: ORAL_STRIP | 12 refills | Status: DC

## 2018-06-05 MED ORDER — ROSUVASTATIN CALCIUM 20 MG PO TABS: ORAL_TABLET | ORAL | 11 refills | Status: DC

## 2018-06-05 MED ORDER — ONETOUCH ULTRASOFT LANCETS MISC: 12 refills | Status: DC

## 2018-06-05 MED ORDER — ONETOUCH VERIO W/DEVICE KIT: PACK | 0 refills | Status: DC

## 2018-06-05 NOTE — Patient Instructions (Addendum)
Get your flu shot from [facility] when you are there.  Check on shingles vaccines with [facility] and your Medicare.

## 2018-06-05 NOTE — Progress Notes (Signed)
   Subjective:    Patient ID: Roberta Clark, female    DOB: 03-12-1961, 57 y.o.   MRN: [id number]  HPI   1.  Hyperlipidemia:  Cholesterol panel much improved with LDL only level not at goal, but LDL down significantly to 112.    Lipid Panel     Component Value Date/Time   CHOL 171 05/31/2018 0911   TRIG 46 05/31/2018 0911   HDL 50 05/31/2018 0911   CHOLHDL 4.4 07/25/2012 1413   VLDL 19 07/25/2012 1413   LDLCALC 112 (H) 05/31/2018 0911   LDLDIRECT 111 (H) 06/10/2009 1933   2.  DM:  A1C now down to 6.5%   3.  Muscle relaxant:  Dr. Brien Few taking care of these prescriptions.  4.  HM:  Has colonoscopy scheduled for Nov 15th with Dr. Michail Sermon at [facility].  Current Meds  Medication Sig  . amitriptyline (ELAVIL) 25 MG tablet TAKE ONE TABLET BY MOUTH AT BEDTIME AS NEEDED FOR SLEEP  . FLUoxetine (PROZAC) 40 MG capsule 1 tab by mouth daily  . gabapentin (NEURONTIN) 600 MG tablet TAKE ONE TABLET BY MOUTH EVERY MORNING AND AT MIDDAY,AND THEN TAKE TWO TABLETS BY MOUTH AT BEDTIME  . glipiZIDE (GLUCOTROL) 5 MG tablet 1/2 tab by mouth twice daily with meals  . lisinopril-hydrochlorothiazide (PRINZIDE,ZESTORETIC) 20-12.5 MG tablet TAKE 1 TABLET BY MOUTH ONCE DAILY  . Melatonin 3 MG SUBL Place 6 mg under the tongue at bedtime.  . meloxicam (MOBIC) 7.5 MG tablet Take 7.5 mg by mouth daily. 2 tablets once a day  . Omega-3 Fatty Acids (FISH OIL) 1000 MG CAPS Take 1 capsule by mouth 2 (two) times daily.  . potassium chloride (KLOR-CON M10) 10 MEQ tablet Take 1 tablet (10 mEq total) by mouth daily.  . rosuvastatin (CRESTOR) 20 MG tablet 1 tab by mouth daily with meal  . tiZANidine (ZANAFLEX) 4 MG tablet Take 4 mg by mouth every 8 (eight) hours as needed for muscle spasms.  . [DISCONTINUED] ibuprofen (ADVIL,MOTRIN) 800 MG tablet TAKE ONE TABLET BY MOUTH TWICE DAILY  . [DISCONTINUED] rosuvastatin (CRESTOR) 10 MG tablet Take 1 tablet (10 mg total) by mouth daily.   Allergies  Allergen Reactions  .  Metformin And Related Nausea And Vomiting  . Lipitor [Atorvastatin] Rash    Review of Systems     Objective:   Physical Exam  Lungs:  CTA CV:  RRR without murmur or rub.  RAdial and DP pulses normal and equal. ABd:  S, NT, No HSM or mass, + BS LE:  No edema.      Assessment & Plan:  1. Hyperlipidemia:  Increase Rosuvastatin to 20 mg daily.  Repreat FLP in 6 weeks with hepatic profile.  2.  DM: improved control.  3.  Hypertension:  Controlled  4. HM:  Recommended flu vaccine at nearest pharmacy and shingles vaccine.  Colonoscopy next month.  Hx of dysplastic polyp.  Physical in Sept 2020 April with me

## 2018-06-06 ENCOUNTER — Other Ambulatory Visit: Payer: Self-pay

## 2018-06-06 MED ORDER — ONETOUCH DELICA LANCETS 33G MISC: 12 refills | Status: DC

## 2018-07-04 ENCOUNTER — Other Ambulatory Visit: Payer: Self-pay | Admitting: Internal Medicine

## 2018-07-04 DIAGNOSIS — M5442 Lumbago with sciatica, left side: Secondary | ICD-10-CM

## 2018-07-15 IMAGING — CR DG HIP (WITH OR WITHOUT PELVIS) 2-3V*L*
2 series · 2 of 2 positions shown · non-contrast
Comparison: None.

CLINICAL DATA: Left hip pain for 2 years, worse recently

EXAM:
DG HIP (WITH OR WITHOUT PELVIS) 2-3V LEFT

[w pelvis *]
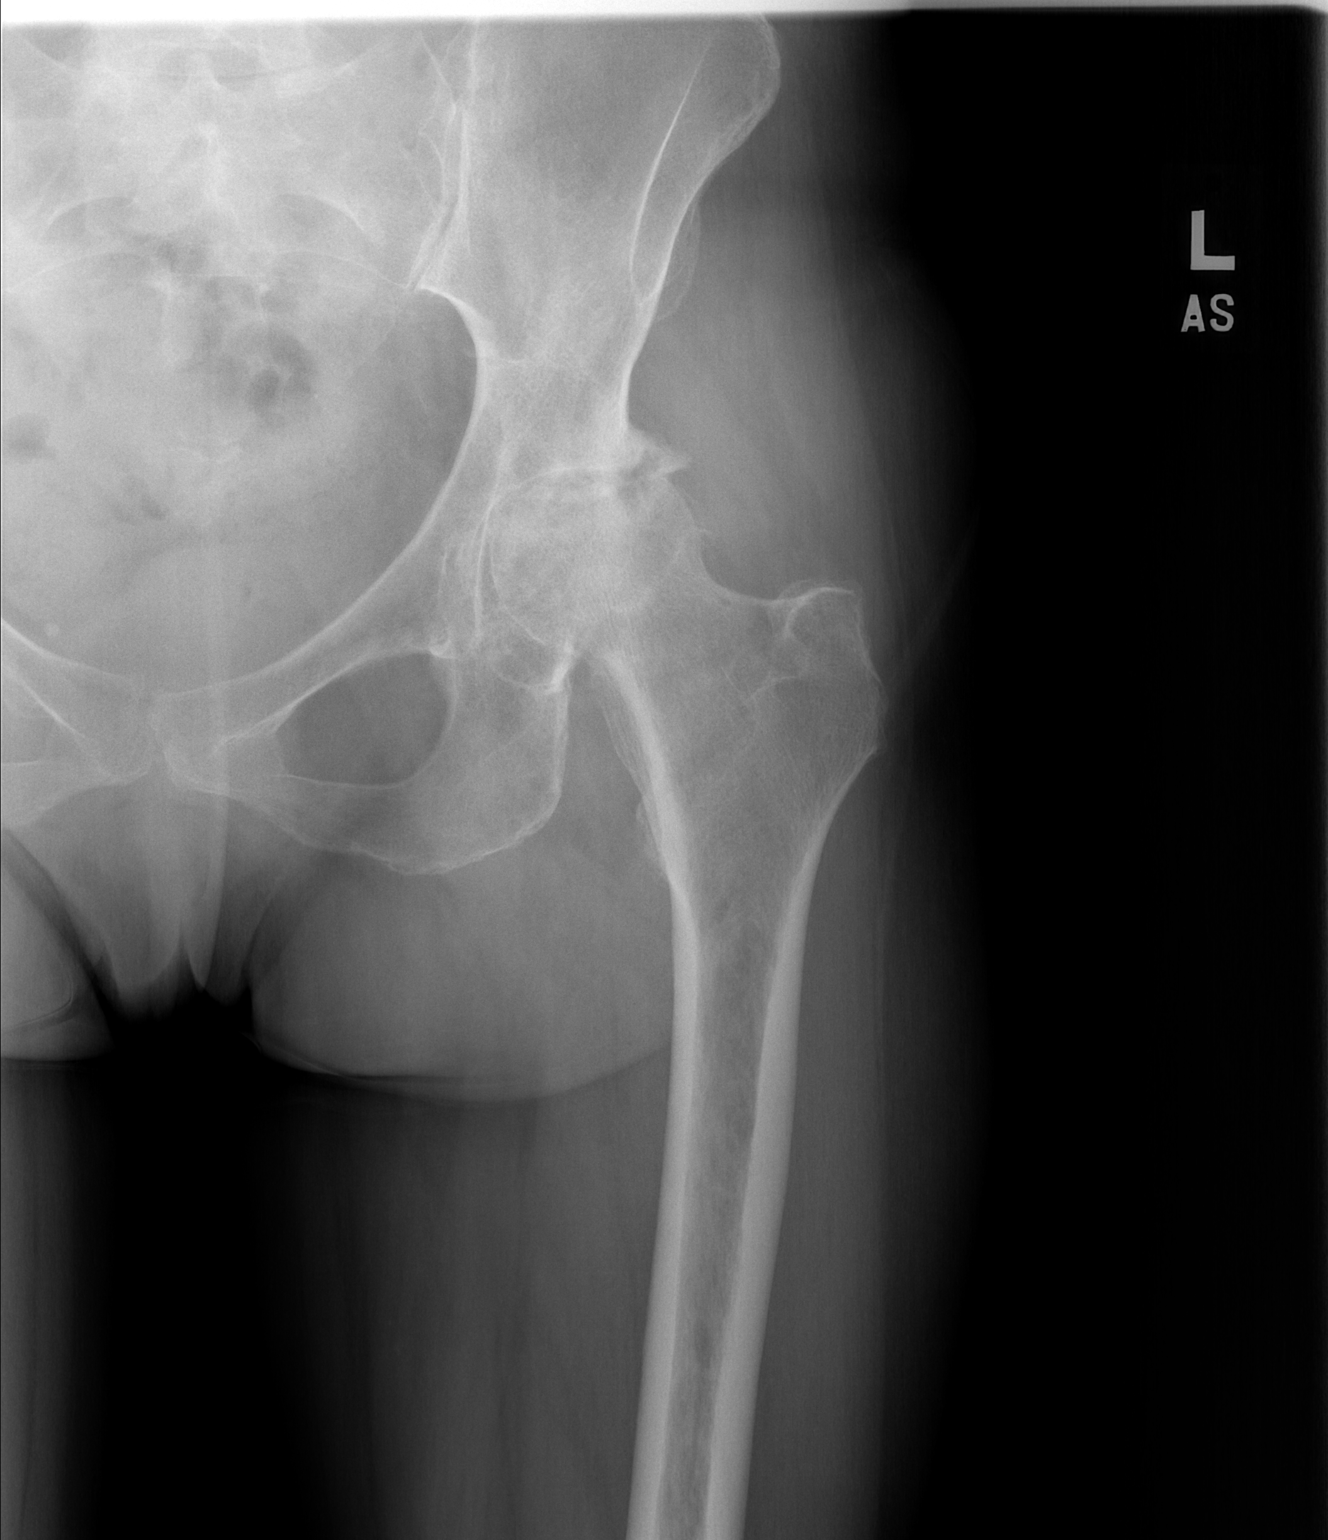

[t hip ap left]
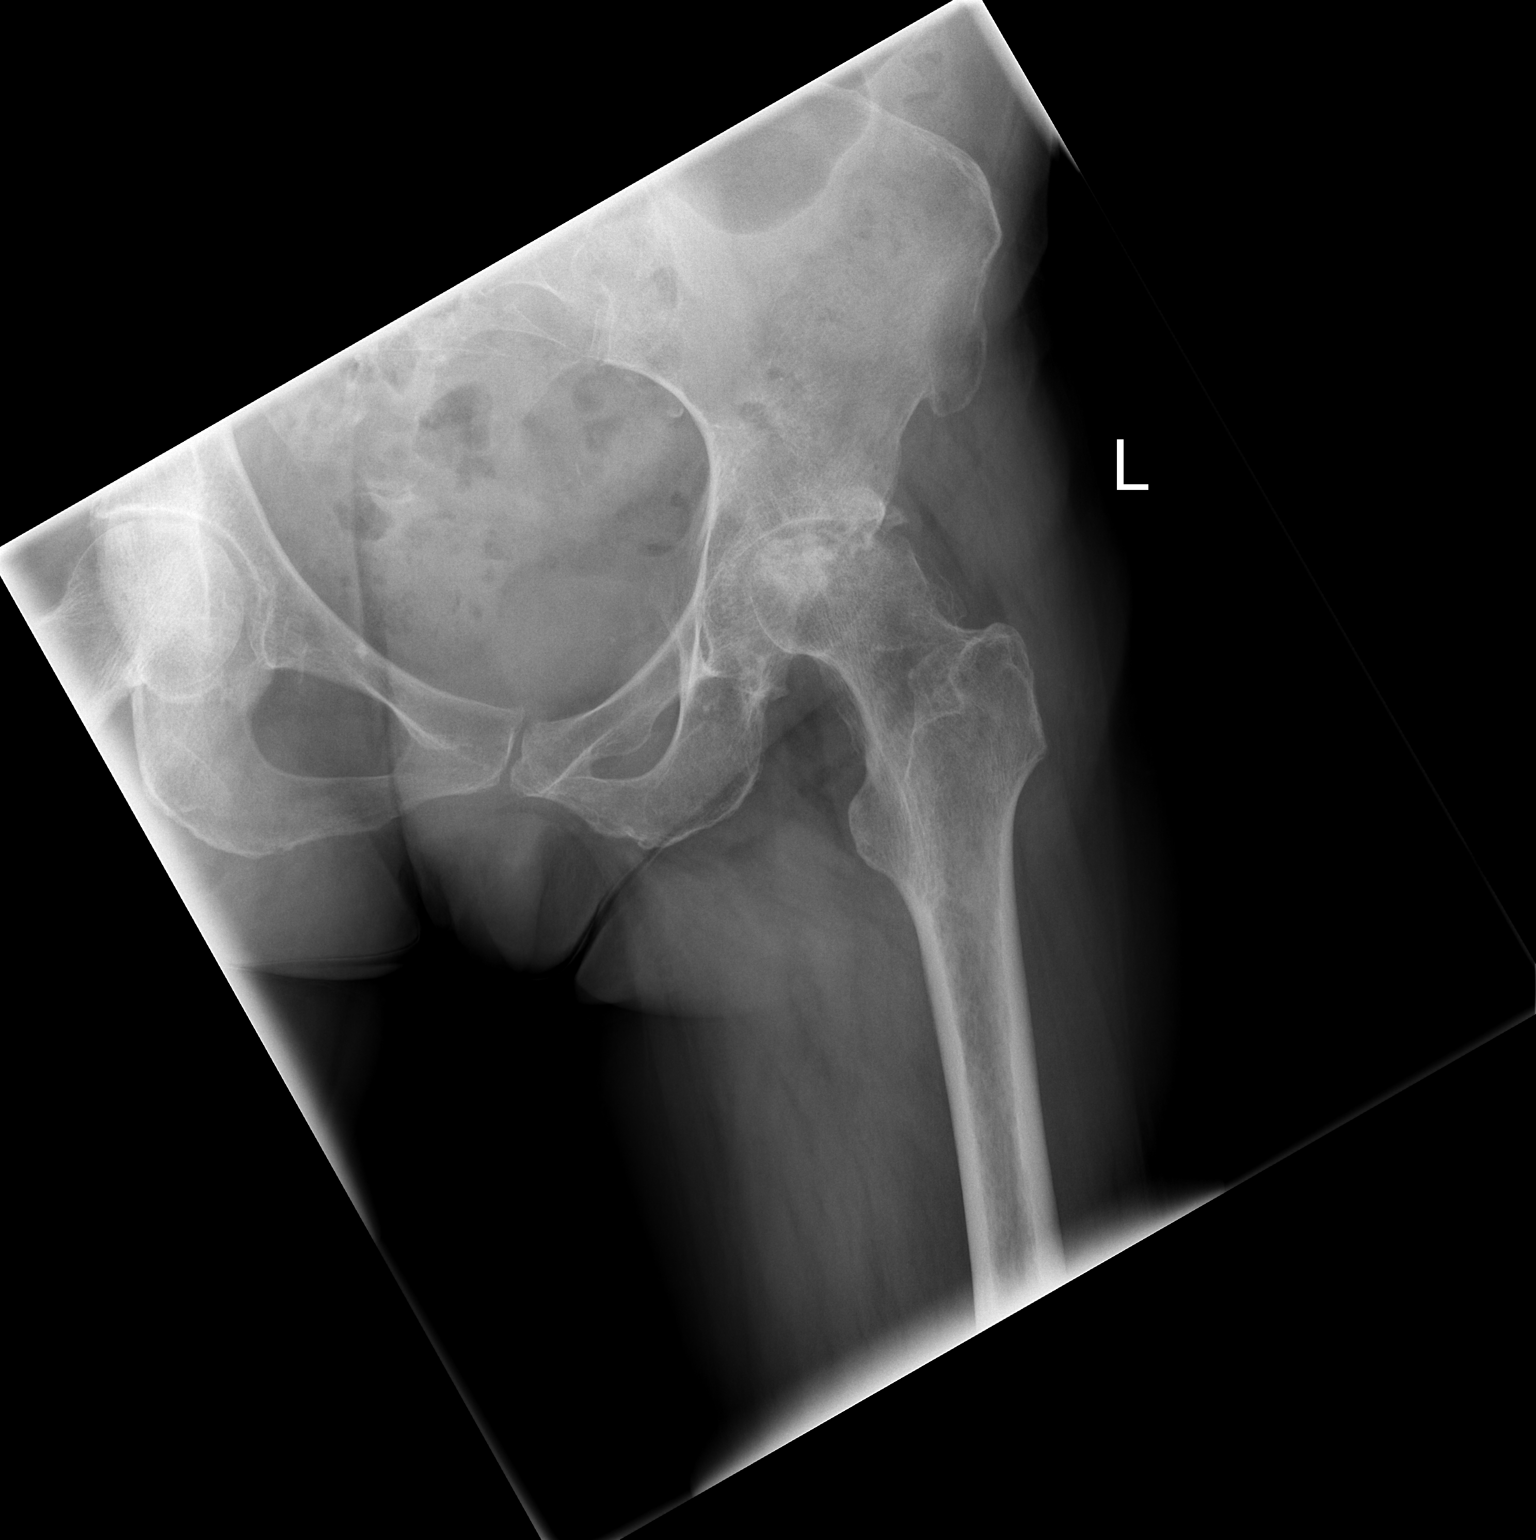

[2 of 2 positions shown; findings below may reference images not displayed]

FINDINGS: There is some deformity of the left femoral head apparently due to
partial collapse as result of avascular necrosis with secondary
degenerative change. There is loss of joint space particularly
superiorly and medially with sclerosis, spurring, and subchondral
cyst formation. No acute fracture is seen. The left pelvic ramus is
intact. The left SI joint is corticated.
IMPRESSION: 1. Avascular necrosis of the left femoral head with partial collapse
and subsequent degenerative change.
2. No acute fracture.

## 2018-07-17 ENCOUNTER — Other Ambulatory Visit: Payer: Medicare Other

## 2018-07-31 ENCOUNTER — Other Ambulatory Visit: Payer: Self-pay | Admitting: Internal Medicine

## 2018-07-31 ENCOUNTER — Other Ambulatory Visit (INDEPENDENT_AMBULATORY_CARE_PROVIDER_SITE_OTHER): Payer: Medicare Other

## 2018-07-31 DIAGNOSIS — Z1231 Encounter for screening mammogram for malignant neoplasm of breast: Secondary | ICD-10-CM

## 2018-07-31 DIAGNOSIS — Z79899 Other long term (current) drug therapy: Secondary | ICD-10-CM

## 2018-07-31 DIAGNOSIS — E782 Mixed hyperlipidemia: Secondary | ICD-10-CM | POA: Diagnosis not present

## 2018-08-01 LAB — HEPATIC FUNCTION PANEL
ALT: 16 IU/L (ref 0–32)
AST: 14 IU/L (ref 0–40)
Albumin: 4.7 g/dL (ref 3.5–5.5)
Alkaline Phosphatase: 70 IU/L (ref 39–117)
Bilirubin Total: <0.2 mg/dL (ref 0.0–1.2)
Bilirubin, Direct: 0.06 mg/dL (ref 0.00–0.40)
Total Protein: 6.8 g/dL (ref 6.0–8.5)

## 2018-08-01 LAB — LIPID PANEL W/O CHOL/HDL RATIO
Cholesterol, Total: 157 mg/dL (ref 100–199)
HDL: 51 mg/dL (ref >39)
LDL CALC: 94 mg/dL (ref 0–99)
Triglycerides: 58 mg/dL (ref 0–149)
VLDL CHOLESTEROL CAL: 12 mg/dL (ref 5–40)

## 2018-08-03 ENCOUNTER — Telehealth: Payer: Self-pay | Admitting: *Deleted

## 2018-08-03 NOTE — Telephone Encounter (Signed)
Attempted to reach patient re: normal lab results. No answer;  no DPR found so LVM leaving office number and advised  she call back Monday. If patient calls back, phone staff may inform her of normal lab results.

## 2018-08-21 ENCOUNTER — Other Ambulatory Visit: Payer: Self-pay | Admitting: Internal Medicine

## 2018-08-21 DIAGNOSIS — F32A Depression, unspecified: Secondary | ICD-10-CM

## 2018-08-21 DIAGNOSIS — F329 Major depressive disorder, single episode, unspecified: Secondary | ICD-10-CM

## 2018-08-21 DIAGNOSIS — G4701 Insomnia due to medical condition: Secondary | ICD-10-CM

## 2018-08-21 DIAGNOSIS — G8929 Other chronic pain: Principal | ICD-10-CM

## 2018-08-25 ENCOUNTER — Emergency Department (HOSPITAL_COMMUNITY)
Admission: EM | Admit: 2018-08-25 | Discharge: 2018-08-26 | Disposition: A | Discharge status: Home / Self Care | Payer: Medicare HMO | Attending: Emergency Medicine | Admitting: Emergency Medicine

## 2018-08-25 ENCOUNTER — Other Ambulatory Visit: Payer: Self-pay

## 2018-08-25 ENCOUNTER — Emergency Department (HOSPITAL_COMMUNITY): Payer: Medicare HMO

## 2018-08-25 ENCOUNTER — Encounter (HOSPITAL_COMMUNITY): Payer: Self-pay | Admitting: Emergency Medicine

## 2018-08-25 DIAGNOSIS — F17210 Nicotine dependence, cigarettes, uncomplicated: Secondary | ICD-10-CM | POA: Insufficient documentation

## 2018-08-25 DIAGNOSIS — R0989 Other specified symptoms and signs involving the circulatory and respiratory systems: Secondary | ICD-10-CM | POA: Diagnosis present

## 2018-08-25 DIAGNOSIS — E119 Type 2 diabetes mellitus without complications: Secondary | ICD-10-CM | POA: Diagnosis not present

## 2018-08-25 DIAGNOSIS — I6523 Occlusion and stenosis of bilateral carotid arteries: Secondary | ICD-10-CM | POA: Insufficient documentation

## 2018-08-25 DIAGNOSIS — J029 Acute pharyngitis, unspecified: Secondary | ICD-10-CM | POA: Diagnosis not present

## 2018-08-25 DIAGNOSIS — Z79899 Other long term (current) drug therapy: Secondary | ICD-10-CM | POA: Insufficient documentation

## 2018-08-25 DIAGNOSIS — I10 Essential (primary) hypertension: Secondary | ICD-10-CM | POA: Insufficient documentation

## 2018-08-25 DIAGNOSIS — Z7984 Long term (current) use of oral hypoglycemic drugs: Secondary | ICD-10-CM | POA: Insufficient documentation

## 2018-08-25 LAB — CBC WITH DIFFERENTIAL/PLATELET
Abs Immature Granulocytes: 0.03 10*3/uL (ref 0.00–0.07)
BASOS ABS: 0.1 10*3/uL (ref 0.0–0.1)
Basophils Relative: 1 %
Eosinophils Absolute: 0.4 10*3/uL (ref 0.0–0.5)
Eosinophils Relative: 5 %
HCT: 37.7 % (ref 36.0–46.0)
Hemoglobin: 12.2 g/dL (ref 12.0–15.0)
Immature Granulocytes: 0 %
LYMPHS ABS: 3.8 10*3/uL (ref 0.7–4.0)
Lymphocytes Relative: 43 %
MCH: 30.2 pg (ref 26.0–34.0)
MCHC: 32.4 g/dL (ref 30.0–36.0)
MCV: 93.3 fL (ref 80.0–100.0)
Monocytes Absolute: 0.6 10*3/uL (ref 0.1–1.0)
Monocytes Relative: 7 %
Neutro Abs: 4 10*3/uL (ref 1.7–7.7)
Neutrophils Relative %: 44 %
Platelets: 265 10*3/uL (ref 150–400)
RBC: 4.04 MIL/uL (ref 3.87–5.11)
RDW: 13.5 % (ref 11.5–15.5)
WBC: 9 10*3/uL (ref 4.0–10.5)
nRBC: 0 % (ref 0.0–0.2)

## 2018-08-25 LAB — I-STAT CHEM 8, ED
BUN: 15 mg/dL (ref 6–20)
CHLORIDE: 104 mmol/L (ref 98–111)
Calcium, Ion: 1.14 mmol/L — ABNORMAL LOW (ref 1.15–1.40)
Creatinine, Ser: 0.7 mg/dL (ref 0.44–1.00)
Glucose, Bld: 81 mg/dL (ref 70–99)
HCT: 38 % (ref 36.0–46.0)
Hemoglobin: 12.9 g/dL (ref 12.0–15.0)
Potassium: 3.6 mmol/L (ref 3.5–5.1)
Sodium: 140 mmol/L (ref 135–145)
TCO2: 27 mmol/L (ref 22–32)

## 2018-08-25 MED ORDER — IOPAMIDOL (ISOVUE-300) INJECTION 61%
INTRAVENOUS | Status: AC
  Filled 2018-08-25: qty 100

## 2018-08-25 MED ORDER — IOPAMIDOL (ISOVUE-300) INJECTION 61%
100.0000 mL | Freq: Once | INTRAVENOUS | Status: AC | PRN
  Administered 2018-08-25: 100 mL via INTRAVENOUS

## 2018-08-25 MED ORDER — SODIUM CHLORIDE (PF) 0.9 % IJ SOLN
INTRAMUSCULAR | Status: AC
  Filled 2018-08-25: qty 50

## 2018-08-25 NOTE — ED Provider Notes (Signed)
She has foreign body sensation in her throat since awakening this morning.  When she went to bed last night she felt normal.  She denies fever.  On exam she is in no distress.  Handling secretions well.  Oropharynx is normal.  Neck appears normal.  There is no pain on palpation or manipulation of laryngeal cartilage to suggest epiglottitis   Orlie Dakin, MD 08/25/18 2218

## 2018-08-25 NOTE — ED Provider Notes (Signed)
[facility] DEPT Provider Note   CSN: [id number] Arrival date & time: 08/25/18  2054     History   Chief Complaint Chief Complaint  Patient presents with  . Something stuck in her throat    HPI Roberta Clark is a 58 y.o. female.  58 y.o female with a PMH of Anemia, Arthritis presents to the ED with a chief complaint of " something stuck in my throat ".  Patient reports she woke up this morning with sensation of having something stuck in her throat, she reports feeling a dull sensation to her neck worse with swallowing.  She has tried water, soda, Alka-Seltzer, bread, spaghetti but reports no changes in her symptoms.  Denies any alleviating factors.  She denies any cough, cold symptoms recently.  She also denies any fever, shortness of breath, chest pain.     Past Medical History:  Diagnosis Date  . Adenomatous colon polyp 05/13/2013  . Anemia   . Arthritis   . Chronic pain   . Depression   . Diabetes mellitus without complication ([place]) 5885   Type II - diet controlled  . Family history of adverse reaction to anesthesia    mother - nausea  . History of anemia   . Hyperlipidemia 2011   Rash with statin:  not well controlled with Gemfibrozil  . Hypertension 2011  . Insomnia secondary to chronic pain   . Low back pain with left-sided sciatica    Reportedly with lumbar spinal stenosis    Patient Active Problem List   Diagnosis Date Noted  . Avascular necrosis of femoral head, right ([place]) 02/22/2017  . Avascular necrosis of femoral head, left ([place]) 10/24/2016  . OA (osteoarthritis) of hip 10/24/2016  . S/P lumbar laminectomy 05/27/2016  . Type 2 diabetes mellitus without complication, without long-term current use of insulin ([place]) 11/03/2015  . Lumbar radiculopathy, chronic 06/14/2013  . Adenomatous colon polyp 05/13/2013  . Vaginal discharge 08/22/2012  . Hypertension 07/25/2012  . Preventative health care 07/25/2012  . Hot flash,  menopausal 07/25/2012  . Chronic low back pain 07/25/2012  . Fibroids 09/02/2011  . Tobacco dependence 09/02/2011  . Hyperlipidemia 08/15/2009    Past Surgical History:  Procedure Laterality Date  . BACK SURGERY    . COLONOSCOPY    . LUMBAR LAMINECTOMY/DECOMPRESSION MICRODISCECTOMY Bilateral 05/27/2016   Procedure: Left Lumbar Four-Five Laminectomy and Foraminotomy;  Surgeon: Eustace Moore, MD;  Location: [facility];  Service: Neurosurgery;  Laterality: Bilateral;  . TOTAL HIP ARTHROPLASTY Left 10/24/2016   Procedure: LEFT TOTAL HIP ARTHROPLASTY ANTERIOR APPROACH;  Surgeon: Gaynelle Arabian, MD;  Location: [facility];  Service: Orthopedics;  Laterality: Left;  . TOTAL HIP ARTHROPLASTY Right 02/22/2017   Procedure: RIGHT TOTAL HIP ARTHROPLASTY ANTERIOR APPROACH;  Surgeon: Gaynelle Arabian, MD;  Location: [facility];  Service: Orthopedics;  Laterality: Right;  . TUBAL LIGATION  1985     OB History    Gravida  2   Para  2   Term  2   Preterm  0   AB  0   Living  2     SAB  0   TAB  0   Ectopic  0   Multiple  0   Live Births               Home Medications    Prior to Admission medications   Medication Sig Start Date End Date Taking? Authorizing Provider  amitriptyline (ELAVIL) 25 MG tablet TAKE ONE  TABLET BY MOUTH AT BEDTIME AS NEEDED SLEEP 08/21/18   Mack Hook, MD  Blood Glucose Monitoring Suppl [facility] VERIO) w/Device KIT Check blood glucose twice daily before meals 06/05/18   Mack Hook, MD  FLUoxetine (PROZAC) 40 MG capsule 1 tab by mouth daily 02/09/18   Mack Hook, MD  gabapentin (NEURONTIN) 600 MG tablet TAKE ONE TABLET BY MOUTH EVERY MORNING AND AT [facility] THEN TAKE TWO TABLETS BY MOUTH AT BEDTIME 04/10/18   Mack Hook, MD  glipiZIDE (GLUCOTROL) 5 MG tablet 1/2 tab by mouth twice daily with meals 03/30/18   Mack Hook, MD  glucose blood (ONETOUCH VERIO) test strip Check Blood glucose twice daily before meals 06/05/18   Mack Hook, MD  IBU 800 MG tablet TAKE ONE TABLET BY MOUTH TWICE DAILY 07/04/18   Mack Hook, MD  iron polysaccharides (NIFEREX) 150 MG capsule Take 1 capsule (150 mg total) by mouth daily. Patient not taking: Reported on 07/10/2017 10/26/16   Dara Lords, Alexzandrew L, PA-C  lisinopril-hydrochlorothiazide (PRINZIDE,ZESTORETIC) 20-12.5 MG tablet TAKE 1 TABLET BY MOUTH ONCE DAILY 12/13/17   Mack Hook, MD  Melatonin 3 MG SUBL Place 6 mg under the tongue at bedtime.    [provider]  meloxicam (MOBIC) 7.5 MG tablet Take 7.5 mg by mouth daily. 2 tablets once a day    [provider]  methocarbamol (ROBAXIN) 500 MG tablet TAKE 1 TABLET BY MOUTH EVERY 8 HOURS AS NEEDED FOR  SPASMS 01/02/18   [provider]  Omega-3 Fatty Acids (FISH OIL) 1000 MG CAPS Take 1 capsule by mouth 2 (two) times daily.    [provider]  [facility] DELICA LANCETS 16X MISC Check blood glucose twice a day before meals. For Dx E11.9 06/06/18   Mack Hook, MD  oxyCODONE (OXY IR/ROXICODONE) 5 MG immediate release tablet Take 1-3 tablets (5-15 mg total) by mouth every 4 (four) hours as needed for moderate pain or severe pain. Patient not taking: Reported on 07/10/2017 02/24/17   Dara Lords, Alexzandrew L, PA-C  potassium chloride (KLOR-CON M10) 10 MEQ tablet Take 1 tablet (10 mEq total) by mouth daily. 05/08/18   Mack Hook, MD  rosuvastatin (CRESTOR) 20 MG tablet 1 tab by mouth daily with meal 06/05/18   Mack Hook, MD  tiZANidine (ZANAFLEX) 4 MG tablet Take 4 mg by mouth every 8 (eight) hours as needed for muscle spasms.    [provider]    Family History Family History  Problem Relation Age of Onset  . Heart disease Mother 30       Hx of MI   . Hypertension Mother   . Diabetes Mother   . Diabetes Sister   . Graves' disease Sister   . Lupus Father   . Graves' disease Brother   . Diabetes Brother   . Arthritis Daughter        Not clear if RA   . Hypertension Daughter   . Migraines Daughter   . Epilepsy Son        Started 41 months of age after bacterial meningitis  . GER disease Sister     Social History Social History   Tobacco Use  . Smoking status: Current Some Day Smoker    Packs/day: 0.10    Types: Cigarettes    Start date: 12/17/1976  . Smokeless tobacco: Never Used  . Tobacco comment: 1-2 cigs daily 10/19  Substance Use Topics  . Alcohol use: No    Alcohol/week: 0.0 standard drinks  . Drug use:  No     Allergies   Atorvastatin; Metformin; and Metformin and related   Review of Systems Review of Systems  Constitutional: Negative for fever.  HENT: Positive for trouble swallowing.   Respiratory: Negative for chest tightness and shortness of breath.   Cardiovascular: Negative for chest pain.  Gastrointestinal: Positive for abdominal pain.  Genitourinary: Negative for dysuria and flank pain.  Musculoskeletal: Negative for back pain.  Skin: Negative for pallor and wound.  All other systems reviewed and are negative.    Physical Exam Updated Vital Signs BP (!) 152/94 (BP Location: Left Arm)   Pulse 79   Temp 98.8 F (37.1 C) (Oral)   Resp 18   Ht _0  (1.626 m)   Wt 54.4 kg   SpO2 98%   BMI 20.60 kg/m   Physical Exam Vitals signs and nursing note reviewed.  Constitutional:      General: She is not in acute distress.    Appearance: She is well-developed.  HENT:     Head: Normocephalic and atraumatic.     Mouth/Throat:     Pharynx: No oropharyngeal exudate.  Eyes:     Pupils: Pupils are equal, round, and reactive to light.  Neck:     Musculoskeletal: Normal range of motion and neck supple. Normal range of motion. No neck rigidity, crepitus, injury, pain with movement or torticollis.     Trachea: Trachea normal.     Comments: No pain with manipulation or coracoid process.  Cardiovascular:     Rate and Rhythm: Regular rhythm.     Heart sounds: Normal heart sounds.  Pulmonary:     Effort:  Pulmonary effort is normal. No respiratory distress.     Breath sounds: Normal breath sounds.  Abdominal:     General: Bowel sounds are normal. There is no distension.     Palpations: Abdomen is soft.     Tenderness: There is no abdominal tenderness.  Musculoskeletal:        General: No tenderness or deformity.     Right lower leg: No edema.     Left lower leg: No edema.  Skin:    General: Skin is warm and dry.  Neurological:     Mental Status: She is alert and oriented to person, place, and time.      ED Treatments / Results  Labs (all labs ordered are listed, but only abnormal results are displayed) Labs Reviewed  CBC WITH DIFFERENTIAL/PLATELET  I-STAT CHEM 8, ED    EKG None  Radiology Dg Neck Soft Tissue  Result Date: 08/25/2018 CLINICAL DATA:  Foreign body sensation EXAM: NECK SOFT TISSUES - 1+ VIEW COMPARISON:  None. FINDINGS: Right carotid vascular calcification. Mild degenerative changes at C6-C7. Normal prevertebral soft tissue thickness. No radiopaque foreign body is visualized. Subglottic trachea is patent. Epiglottis appears slightly thickened. IMPRESSION: 1. Negative for radiopaque foreign body. 2. Slightly thickened appearance of the epiglottis, questionable for inflammation Electronically Signed   By: Donavan Foil M.D.   On: 08/25/2018 21:46    Procedures Procedures (including critical care time)  Medications Ordered in ED Medications - No data to display   Initial Impression / Assessment and Plan / ED Course  I have reviewed the triage vital signs and the nursing notes.  Pertinent labs & imaging results that were available during my care of the patient were reviewed by me and considered in my medical decision making (see chart for details).   To the ED with a feeling of something stuck  in her throat, she woke up with the symptoms this morning.  Reports trying multiple over-the-counter therapy such as Alka-Seltzer, water, spaghetti but no relieving  symptoms. DG soft tissue showed: 1. Negative for radiopaque foreign body.  2. Slightly thickened appearance of the epiglottis, questionable for  inflammation     She reports there is pain with swallowing, denies any shortness of breath and vitals are stable at this time.  Will order CT soft tissue neck with contrast to rule out any epiglottitis.   10:45 PM Patient signed out to [place]. PA pending CT soft tissue, if normal patient is appropriate for discharge home pending reassessment.     Final Clinical Impressions(s) / ED Diagnoses   Final diagnoses:  Neck pain    ED Discharge Orders    None       Janeece Fitting, PA-C 08/25/18 2246    Orlie Dakin, MD 08/26/18 0006

## 2018-08-25 NOTE — Discharge Instructions (Addendum)
Thank you for allowing me to care for you today in the Emergency Department.   Your CT scan was consistent with viral pharyngitis or inflammation of the throat.  This is most likely from a virus.  You can take a Tylenol and ibuprofen to help with pain and swelling.  Eating and drinking hot and cold beverages may be easier to swallow.  You can gargle and spit out warm salt water 2 times daily.  You were given a dose of Decadron in the emergency department.  This might cause your blood sugar to be more elevated over the next 1 to 2 days because this is a steroid medication, but helps with swelling associated with a sore throat.  You can also swallow 15 mL of viscous lidocaine every 3 hours as needed for sore throat.  Your CT scan today did show some plaques in your carotid arteries, but there is no immediate intervention for this.  You can follow-up with your primary care provider for this.  Return to the emergency department if you feel as if your throat is closing, if you start drooling because you are unable to swallow, if you become short of breath, or develop other new, concerning symptoms.

## 2018-08-25 NOTE — ED Provider Notes (Signed)
58 year old female received at sign out from [place] pending CT. Per her HPI:   "58 y.o female with a PMH of Anemia, Arthritis presents to the ED with a chief complaint of " something stuck in my throat ".  Patient reports she woke up this morning with sensation of having something stuck in her throat, she reports feeling a dull sensation to her neck worse with swallowing.  She has tried water, soda, Alka-Seltzer, bread, spaghetti but reports no changes in her symptoms.  Denies any alleviating factors.  She denies any cough, cold symptoms recently.  She also denies any fever, shortness of breath, chest pain."  Physical Exam  BP (!) 132/92 (BP Location: Left Arm)   Pulse 67   Temp 98.8 F (37.1 C) (Oral)   Resp 14   Ht 5\' 4"  (1.626 m)   Wt 54.4 kg   SpO2 100%   BMI 20.60 kg/m   Physical Exam  No acute distress.  Speaks in complete, fluent sentences.  Posterior oropharynx is patent.  ED Course/Procedures     Procedures  MDM  58 year old female received a signout from [place] pending CT soft tissue neck she awoke with globus sensation, onset this morning.  CT with mild pharyngeal mucosal thickening and enhancement with tonsil enlargement, suspicious for pharyngitis.  No epiglottic thickening.  There is also carotid atherosclerosis of the right proximal ICA approximately 70% and of the left proximal ICA at approximately 50%.  Strep PCR is negative.  Both incidental and other findings were discussed with the patient.  Doubt epiglottitis, peritonsillar abscess, or streptococcal pharyngitis.  She was given a dose of Decadron and Toradol in the ED and I advised her to follow-up with primary care if symptoms worsen.  She can also follow-up with primary care if she has questions regarding carotid atherosclerosis as we discussed on her CT.  She was also given education regarding supportive care pharyngitis at home.  Strict return precautions given.  She is hemodynamically stable and in no acute  distress.  She is safe for discharge home with outpatient follow-up at this time.       Joanne Gavel, PA-C 08/26/18 0805    Merrily Pew, MD 08/30/18 2337

## 2018-08-25 NOTE — ED Notes (Signed)
Pt is able to swallow saliva, tea and food and keep it down.

## 2018-08-25 NOTE — ED Triage Notes (Signed)
Patient states that she feels like something stuck in her throat. Patient has been different things to try to fix the problem on her own.

## 2018-08-26 DIAGNOSIS — J029 Acute pharyngitis, unspecified: Secondary | ICD-10-CM | POA: Diagnosis not present

## 2018-08-26 LAB — GROUP A STREP BY PCR: Group A Strep by PCR: NOT DETECTED

## 2018-08-26 MED ORDER — KETOROLAC TROMETHAMINE 30 MG/ML IJ SOLN
30.0000 mg | Freq: Once | INTRAMUSCULAR | Status: AC
  Administered 2018-08-26: 30 mg via INTRAVENOUS
  Filled 2018-08-26: qty 1

## 2018-08-26 MED ORDER — DEXAMETHASONE SODIUM PHOSPHATE 10 MG/ML IJ SOLN
10.0000 mg | Freq: Once | INTRAMUSCULAR | Status: AC
  Administered 2018-08-26: 10 mg via INTRAVENOUS
  Filled 2018-08-26: qty 1

## 2018-08-26 MED ORDER — LIDOCAINE VISCOUS HCL 2 % MT SOLN: 15.0000 mL | OROMUCOSAL | 0 refills | Status: DC | PRN

## 2018-08-29 DIAGNOSIS — M7062 Trochanteric bursitis, left hip: Secondary | ICD-10-CM | POA: Insufficient documentation

## 2018-09-05 ENCOUNTER — Ambulatory Visit
Admission: RE | Admit: 2018-09-05 | Discharge: 2018-09-05 | Disposition: A | Discharge status: Home / Self Care | Payer: Medicare HMO | Source: Ambulatory Visit | Attending: Internal Medicine | Admitting: Internal Medicine

## 2018-09-05 DIAGNOSIS — Z1231 Encounter for screening mammogram for malignant neoplasm of breast: Secondary | ICD-10-CM

## 2018-10-09 ENCOUNTER — Other Ambulatory Visit (INDEPENDENT_AMBULATORY_CARE_PROVIDER_SITE_OTHER): Payer: Medicare HMO

## 2018-10-09 DIAGNOSIS — R8290 Unspecified abnormal findings in urine: Secondary | ICD-10-CM

## 2018-10-09 LAB — POCT URINALYSIS DIPSTICK
BILIRUBIN UA: NEGATIVE
Blood, UA: NEGATIVE
Glucose, UA: NEGATIVE
KETONES UA: NEGATIVE
Nitrite, UA: POSITIVE
Protein, UA: NEGATIVE
Spec Grav, UA: 1.015 (ref 1.010–1.025)
Urobilinogen, UA: 0.2 E.U./dL
pH, UA: 6 (ref 5.0–8.0)

## 2018-10-09 MED ORDER — CIPROFLOXACIN HCL 500 MG PO TABS: 500.0000 mg | ORAL_TABLET | Freq: Two times a day (BID) | ORAL | 0 refills | Status: AC

## 2018-10-12 LAB — URINE CULTURE

## 2018-10-15 ENCOUNTER — Other Ambulatory Visit: Payer: Self-pay | Admitting: Internal Medicine

## 2018-10-24 ENCOUNTER — Telehealth: Payer: Self-pay | Admitting: Internal Medicine

## 2018-10-24 NOTE — Telephone Encounter (Signed)
Patient called to inform doctor that CIPRO has worked for patient and does not think she needs another refill.  Just wanted doctor to know, if things change patient will call back info office.

## 2018-10-25 NOTE — Telephone Encounter (Signed)
noted 

## 2018-12-01 ENCOUNTER — Other Ambulatory Visit: Payer: Self-pay | Admitting: Internal Medicine

## 2018-12-01 DIAGNOSIS — F419 Anxiety disorder, unspecified: Secondary | ICD-10-CM

## 2018-12-01 DIAGNOSIS — F329 Major depressive disorder, single episode, unspecified: Secondary | ICD-10-CM

## 2018-12-01 DIAGNOSIS — F32A Depression, unspecified: Secondary | ICD-10-CM

## 2018-12-01 DIAGNOSIS — M5442 Lumbago with sciatica, left side: Secondary | ICD-10-CM

## 2018-12-04 ENCOUNTER — Telehealth (INDEPENDENT_AMBULATORY_CARE_PROVIDER_SITE_OTHER): Payer: Medicare HMO | Admitting: Internal Medicine

## 2018-12-04 ENCOUNTER — Other Ambulatory Visit: Payer: Self-pay

## 2018-12-04 DIAGNOSIS — M5416 Radiculopathy, lumbar region: Secondary | ICD-10-CM

## 2018-12-04 DIAGNOSIS — E782 Mixed hyperlipidemia: Secondary | ICD-10-CM

## 2018-12-04 DIAGNOSIS — M79672 Pain in left foot: Secondary | ICD-10-CM | POA: Diagnosis not present

## 2018-12-04 DIAGNOSIS — M79671 Pain in right foot: Secondary | ICD-10-CM | POA: Diagnosis not present

## 2018-12-04 DIAGNOSIS — F329 Major depressive disorder, single episode, unspecified: Secondary | ICD-10-CM

## 2018-12-04 DIAGNOSIS — F32A Depression, unspecified: Secondary | ICD-10-CM

## 2018-12-04 DIAGNOSIS — I10 Essential (primary) hypertension: Secondary | ICD-10-CM

## 2018-12-04 DIAGNOSIS — E119 Type 2 diabetes mellitus without complications: Secondary | ICD-10-CM

## 2018-12-04 MED ORDER — ONETOUCH DELICA LANCETS 33G MISC: 12 refills | Status: DC

## 2018-12-04 MED ORDER — MELOXICAM 7.5 MG PO TABS: ORAL_TABLET | ORAL | 0 refills | Status: DC

## 2018-12-04 MED ORDER — GLUCOSE BLOOD VI STRP: ORAL_STRIP | 12 refills | Status: DC

## 2018-12-04 MED ORDER — AMITRIPTYLINE HCL 25 MG PO TABS: 25.0000 mg | ORAL_TABLET | Freq: Every day | ORAL | Status: DC

## 2018-12-04 NOTE — Progress Notes (Signed)
Subjective:    Patient ID: Roberta Clark, female   DOB: 09/26/1960, 58 y.o.   MRN: [id number]   HPI   Virtual video visit 15 minutes  1.  HM:  Needs pap smear.  2.  ?Bunions:  "Bumps on feet" at base of great toes starting to hurt more.  Would like to have addressed.    3.  DM:  Needs more test strips and lancets. A1C was excellent at 6.4% in October 2019 after starting very low dose Glipizide 2.5 mg twice daily.  Previously, diet controlled.  Sugars generally under 100 fasting. She states she has lost some weight and is more physically active. Checking feet nightly.    4.  Chronic pain of back:  Still getting injections with Dr. Brien Few.  Planning to get implanted stimulation device. She states she is taking Meloxicam 7.5 mg 2 tabs daily and Amitriptyline 25 mg at bedtime at this point which are not in her med list.   Discussed she must get this from Dr. Brien Few.  5.  Hypertension:  Continues on Lisinopril/HCTZ.  Has been well controlled  6.  Depression:  Doing well with Fluoxetine.  7.  Hyperlipidemia:  Tolerating Rosuvastatin fine with much improvement on 20 mg.  Last checked end of December, though LDL not quite at goal.   Lipid Panel     Component Value Date/Time   CHOL 157 07/31/2018 0913   TRIG 58 07/31/2018 0913   HDL 51 07/31/2018 0913   CHOLHDL 4.4 07/25/2012 1413   VLDL 19 07/25/2012 1413   LDLCALC 94 07/31/2018 0913   LDLDIRECT 111 (H) 06/10/2009 1933    Current Meds  Medication Sig  . Blood Glucose Monitoring Suppl (ONETOUCH VERIO) w/Device KIT Check blood glucose twice daily before meals  . FLUoxetine (PROZAC) 40 MG capsule TAKE ONE CAPSULE BY MOUTH EVERY DAY  . gabapentin (NEURONTIN) 600 MG tablet TAKE ONE TABLET BY MOUTH EVERY MORNING AND AT MIDDAY,AND THEN TAKE TWO TABLETS BY MOUTH AT BEDTIME (Patient taking differently: Take 600-1,200 mg by mouth See admin instructions. TAKE ONE TABLET BY MOUTH EVERY MORNING AND AT MIDDAY,AND THEN TAKE TWO TABLETS BY  MOUTH AT BEDTIME)  . glipiZIDE (GLUCOTROL) 5 MG tablet 1/2 tab by mouth twice daily with meals  . glucose blood (ONETOUCH VERIO) test strip Check Blood glucose twice daily before meals  . ibuprofen (ADVIL) 800 MG tablet TAKE ONE TABLET BY MOUTH TWICE DAILY  . iron polysaccharides (NIFEREX) 150 MG capsule Take 1 capsule (150 mg total) by mouth daily.  Marland Kitchen lisinopril-hydrochlorothiazide (PRINZIDE,ZESTORETIC) 20-12.5 MG tablet TAKE ONE TABLET BY MOUTH ONCE DAILY  . Melatonin 3 MG SUBL Place 6 mg under the tongue at bedtime.  . methocarbamol (ROBAXIN) 500 MG tablet Take 500 mg by mouth every 8 (eight) hours as needed for muscle spasms.   . Omega-3 Fatty Acids (FISH OIL) 1000 MG CAPS Take 1 capsule by mouth 2 (two) times daily.  Glory Rosebush DELICA LANCETS 48N MISC Check blood glucose twice a day before meals. For Dx E11.9  . potassium chloride (KLOR-CON M10) 10 MEQ tablet Take 1 tablet (10 mEq total) by mouth daily.  . rosuvastatin (CRESTOR) 20 MG tablet 1 tab by mouth daily with meal   Allergies  Allergen Reactions  . Atorvastatin Rash  . Metformin And Related Nausea And Vomiting     Review of Systems    Objective:   There were no vitals taken for this visit.  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 Shoes appear to have a roomy toe box.   Assessment & Plan   1.  HM:  Will get her scheduled in next 3 months for CPE with pap in September.   She is due for some fasting labs when can come in during COVID-19 pandemic--will try to get her in in next month.  2.  DM:  A1C quite good last fall.  Return in next month for repeat with fasting labs.  If lower, will discuss stopping the Glipizide as sounds like with improved pain control, she is more active.   Rx for lancet and test strips to [facility].  3.  Bunions/Foot pain:  Did not see her feet today, but describes bunions and already with pain control for other orthopedic  reasons. Podiatry referral.  4.  Chronic Back Pain:  Continues with injections for now.  Possible implantable nerve stimulator in future. She will check with Dr. Lauris Poag office about the Amitriptyline and Meloxicam as well as Methocarbamol for refills.  5.  Hyperlipidemia:  FLP in next month  6.  Hypertension:  Has been well controlled chronically.  CPM  7.  Depression:  Controlled.  Continue Fluoxetine

## 2018-12-04 NOTE — Patient Instructions (Signed)
[name] will call to set up CPE with pap after Sept 22 Need fasting labs in next month:  CBC, CMP, A1C, FLP, urine microalbumin/crea

## 2018-12-05 ENCOUNTER — Ambulatory Visit: Payer: Medicare Other | Admitting: Internal Medicine

## 2018-12-06 NOTE — Progress Notes (Signed)
Noted  

## 2018-12-11 ENCOUNTER — Other Ambulatory Visit: Payer: Self-pay | Admitting: Sports Medicine

## 2018-12-11 ENCOUNTER — Ambulatory Visit (INDEPENDENT_AMBULATORY_CARE_PROVIDER_SITE_OTHER): Payer: Medicare HMO

## 2018-12-11 ENCOUNTER — Ambulatory Visit (INDEPENDENT_AMBULATORY_CARE_PROVIDER_SITE_OTHER): Payer: Medicare HMO | Admitting: Sports Medicine

## 2018-12-11 ENCOUNTER — Encounter: Payer: Self-pay | Admitting: Sports Medicine

## 2018-12-11 ENCOUNTER — Other Ambulatory Visit: Payer: Self-pay

## 2018-12-11 VITALS — BP 142/78 | HR 84 | Temp 97.3°F

## 2018-12-11 DIAGNOSIS — M79672 Pain in left foot: Secondary | ICD-10-CM

## 2018-12-11 DIAGNOSIS — M7751 Other enthesopathy of right foot: Secondary | ICD-10-CM | POA: Diagnosis not present

## 2018-12-11 DIAGNOSIS — M779 Enthesopathy, unspecified: Secondary | ICD-10-CM

## 2018-12-11 DIAGNOSIS — M79671 Pain in right foot: Secondary | ICD-10-CM

## 2018-12-11 DIAGNOSIS — M21619 Bunion of unspecified foot: Secondary | ICD-10-CM

## 2018-12-11 DIAGNOSIS — L600 Ingrowing nail: Secondary | ICD-10-CM | POA: Diagnosis not present

## 2018-12-11 DIAGNOSIS — E119 Type 2 diabetes mellitus without complications: Secondary | ICD-10-CM

## 2018-12-11 DIAGNOSIS — M7750 Other enthesopathy of unspecified foot: Secondary | ICD-10-CM | POA: Diagnosis not present

## 2018-12-11 MED ORDER — TRIAMCINOLONE ACETONIDE 10 MG/ML IJ SUSP
10.0000 mg | Freq: Once | INTRAMUSCULAR | Status: AC
  Administered 2018-12-11: 10 mg

## 2018-12-11 NOTE — Progress Notes (Signed)
Subjective: Roberta Clark is a 58 y.o. female patient who presents to office for evaluation of bilateral foot pain. Patient complains of progressive pain especially over the last several years in the right and left foot at the bunions. Ranks pain 10/10 throbbing and is now interferring with daily activities. Patient has tried changing shoes with no relief in symptoms. Patient also admits to pain at right great toenail at medial aspect, denies warmth, redness, drainage. Has been soaking with epsom salt and reports that pain is worse with pressure or touch. Patient denies any other pedal complaints. Denies injury/trip/fall/sprain/any causative factors.   Diet controlled diabetic FBS ranges from 87-102, A1c unknown  Review of Systems  Musculoskeletal: Positive for joint pain.  Skin:       Nail issue  All other systems reviewed and are negative.    Patient Active Problem List   Diagnosis Date Noted  . Foot pain, bilateral 12/04/2018  . Avascular necrosis of femoral head, right ([place]) 02/22/2017  . Avascular necrosis of femoral head, left ([place]) 10/24/2016  . OA (osteoarthritis) of hip 10/24/2016  . S/P lumbar laminectomy 05/27/2016  . Type 2 diabetes mellitus without complication, without long-term current use of insulin ([place]) 11/03/2015  . Lumbar radiculopathy, chronic 06/14/2013  . Adenomatous colon polyp 05/13/2013  . Vaginal discharge 08/22/2012  . Hypertension 07/25/2012  . Preventative health care 07/25/2012  . Hot flash, menopausal 07/25/2012  . Chronic low back pain 07/25/2012  . Fibroids 09/02/2011  . Tobacco dependence 09/02/2011  . Hyperlipidemia 08/15/2009    Current Outpatient Medications on File Prior to Visit  Medication Sig Dispense Refill  . amitriptyline (ELAVIL) 25 MG tablet Take 1 tablet (25 mg total) by mouth at bedtime.    . Blood Glucose Monitoring Suppl (ONETOUCH VERIO) w/Device KIT Check blood glucose twice daily before meals 1 kit 0  . FLUoxetine (PROZAC) 40  MG capsule TAKE ONE CAPSULE BY MOUTH EVERY DAY 30 capsule 5  . gabapentin (NEURONTIN) 600 MG tablet TAKE ONE TABLET BY MOUTH EVERY MORNING AND AT MIDDAY,AND THEN TAKE TWO TABLETS BY MOUTH AT BEDTIME (Patient taking differently: Take 600-1,200 mg by mouth See admin instructions. TAKE ONE TABLET BY MOUTH EVERY MORNING AND AT MIDDAY,AND THEN TAKE TWO TABLETS BY MOUTH AT BEDTIME) 120 tablet 11  . glipiZIDE (GLUCOTROL) 5 MG tablet 1/2 tab by mouth twice daily with meals 30 tablet 11  . glucose blood (ONETOUCH VERIO) test strip Check Blood glucose twice daily before meals 100 each 12  . ibuprofen (ADVIL) 800 MG tablet TAKE ONE TABLET BY MOUTH TWICE DAILY 60 tablet 4  . iron polysaccharides (NIFEREX) 150 MG capsule Take 1 capsule (150 mg total) by mouth daily. 21 capsule 0  . lisinopril-hydrochlorothiazide (PRINZIDE,ZESTORETIC) 20-12.5 MG tablet TAKE ONE TABLET BY MOUTH ONCE DAILY 30 tablet 8  . Melatonin 3 MG SUBL Place 6 mg under the tongue at bedtime.    . meloxicam (MOBIC) 7.5 MG tablet 2 tabs by mouth once daily 30 tablet 0  . methocarbamol (ROBAXIN) 500 MG tablet Take 500 mg by mouth every 8 (eight) hours as needed for muscle spasms.   0  . Omega-3 Fatty Acids (FISH OIL) 1000 MG CAPS Take 1 capsule by mouth 2 (two) times daily.    Glory Rosebush Delica Lancets 83M MISC Check blood glucose twice a day before meals. For Dx E11.9 100 each 12  . potassium chloride (KLOR-CON M10) 10 MEQ tablet Take 1 tablet (10 mEq total) by mouth daily. 30 tablet  11  . tiZANidine (ZANAFLEX) 4 MG tablet tizanidine 4 mg tablet  take 1 by oral route 3 times every day as needed as needed    . rosuvastatin (CRESTOR) 20 MG tablet 1 tab by mouth daily with meal (Patient not taking: Reported on 12/11/2018) 30 tablet 11   No current facility-administered medications on file prior to visit.     Allergies  Allergen Reactions  . Atorvastatin Rash  . Metformin And Related Nausea And Vomiting    Objective:  General: Alert and  oriented x3 in no acute distress  Dermatology: No open lesions bilateral lower extremities, no webspace macerations, no ecchymosis bilateral, all nails x 10 are well manicured with polish and mild curvature noted at medial margin of right hallux with no acute signs of infection.  Vascular: Dorsalis Pedis and Posterior Tibial pedal pulses palpable, Capillary Fill Time 3 seconds,(+) pedal hair growth bilateral, no edema bilateral lower extremities, Temperature gradient within normal limits.  Neurology: Johney Maine sensation intact via light touch bilateral, Protective sensation intact  with Semmes Weinstein Monofilament to all pedal sites, Position sense intact, vibratory intact bilateral, Deep tendon reflexes within normal limits bilateral, No babinski sign present bilateral. (- )Tinels sign bilateral.   Musculoskeletal: Mild tenderness with palpation at bunions R>L foot,+ Pes planus. Strength within normal limits in all groups bilateral.   Gait: Antalgic gait  Xrays  Right and Left Foot   Impression:Increased IM angle consistent with bunion and midtarsal breach consistent with pes planus. No other acute findings.   Assessment and Plan: Problem List Items Addressed This Visit      Endocrine   Type 2 diabetes mellitus without complication, without long-term current use of insulin ([place])    Other Visit Diagnoses    Capsulitis    -  Primary   Relevant Medications   triamcinolone acetonide (KENALOG) 10 MG/ML injection 10 mg (Completed)   Bunion       Bilateral foot pain       Relevant Orders   DG Foot Complete Left (Completed)   Ingrowing nail       R 1st toe medial aspect       -Complete examination performed -Xrays reviewed -Discussed treatement options for capsulitis at bunions bilateral and non-infected ingrowing nail at right great toe -Mechanically debrided right 1st toenail using sterile nail nipper without incident and applied antibiotic cream and bandaid and advised patient to do  the same daily for the next week -After oral consent and aseptic prep, injected a mixture containing 1 ml of 2%  plain lidocaine, 1 ml 0.5% plain marcaine, 0.5 ml of kenalog 10 and 0.5 ml of dexamethasone phosphate into Right 1st MTPJ without complication. Post-injection care discussed with patient.  -Advised wider shoes and bunion cushions or pads PRN -Patient to return to office in no better on next week for right great toe ingrown nail procedure of if bunion pain worsens or sooner if problems arise.  Landis Martins, DPM

## 2018-12-14 ENCOUNTER — Telehealth: Payer: Self-pay | Admitting: Sports Medicine

## 2018-12-18 NOTE — Telephone Encounter (Signed)
Dr. Cannon Kettle wanted me to call to let her know how my foot is doing today. Its doing okay but I'm still having some pain. If you could please have her call me back.

## 2018-12-18 NOTE — Telephone Encounter (Signed)
She can call back for an appointment in 2 weeks for [place] to discuss her symptoms and next step in care. Meanwhile continue with all of the other recommendations -Dr. Cannon Kettle

## 2018-12-18 NOTE — Telephone Encounter (Signed)
I called Roberta Clark and she states the foot still hurts, she is soaking. I informed Roberta Clark of Dr. Leeanne Rio 12/18/2018 12:18pm statement. Roberta Clark states she will be ready to schedule when Dr. Cannon Kettle is ready.

## 2018-12-18 NOTE — Telephone Encounter (Signed)
Val  Can you check on patient to see what her symptoms are? Its normal to still have a little pain at bunion site after injection, she should rest, ice, elevate, OTC motrin or oral anti-inflammatory. Also for the nail issue that she had last visit make sure if there's soreness that she is soaking with epsom salt and to call us back if symptoms are worse -Dr. Cannon Kettle

## 2019-01-02 ENCOUNTER — Other Ambulatory Visit: Payer: Medicare HMO

## 2019-01-18 ENCOUNTER — Other Ambulatory Visit: Payer: Self-pay | Admitting: Internal Medicine

## 2019-01-24 ENCOUNTER — Other Ambulatory Visit (INDEPENDENT_AMBULATORY_CARE_PROVIDER_SITE_OTHER): Payer: Medicare HMO

## 2019-01-24 ENCOUNTER — Other Ambulatory Visit: Payer: Self-pay

## 2019-01-24 DIAGNOSIS — E119 Type 2 diabetes mellitus without complications: Secondary | ICD-10-CM

## 2019-01-24 DIAGNOSIS — E782 Mixed hyperlipidemia: Secondary | ICD-10-CM

## 2019-01-24 DIAGNOSIS — Z79899 Other long term (current) drug therapy: Secondary | ICD-10-CM

## 2019-01-24 NOTE — Addendum Note (Signed)
Addended by: Serafina Mitchell on: 01/24/2019 11:25 AM   Modules accepted: Orders

## 2019-01-25 LAB — CBC WITH DIFFERENTIAL/PLATELET
Basophils Absolute: 0.1 10*3/uL (ref 0.0–0.2)
Basos: 1 %
EOS (ABSOLUTE): 0.3 10*3/uL (ref 0.0–0.4)
Eos: 4 %
Hematocrit: 33.6 % — ABNORMAL LOW (ref 34.0–46.6)
Hemoglobin: 11.3 g/dL (ref 11.1–15.9)
Immature Grans (Abs): 0 10*3/uL (ref 0.0–0.1)
Immature Granulocytes: 0 %
Lymphocytes Absolute: 2.8 10*3/uL (ref 0.7–3.1)
Lymphs: 37 %
MCH: 30.3 pg (ref 26.6–33.0)
MCHC: 33.6 g/dL (ref 31.5–35.7)
MCV: 90 fL (ref 79–97)
Monocytes Absolute: 0.4 10*3/uL (ref 0.1–0.9)
Monocytes: 6 %
Neutrophils Absolute: 3.9 10*3/uL (ref 1.4–7.0)
Neutrophils: 52 %
Platelets: 260 10*3/uL (ref 150–450)
RBC: 3.73 x10E6/uL — ABNORMAL LOW (ref 3.77–5.28)
RDW: 12.2 % (ref 11.7–15.4)
WBC: 7.5 10*3/uL (ref 3.4–10.8)

## 2019-01-25 LAB — COMPREHENSIVE METABOLIC PANEL
ALT: 22 IU/L (ref 0–32)
AST: 25 IU/L (ref 0–40)
Albumin/Globulin Ratio: 1.7 (ref 1.2–2.2)
Albumin: 4.1 g/dL (ref 3.8–4.9)
Alkaline Phosphatase: 65 IU/L (ref 39–117)
BUN/Creatinine Ratio: 35 — ABNORMAL HIGH (ref 9–23)
BUN: 23 mg/dL (ref 6–24)
Bilirubin Total: 0.2 mg/dL (ref 0.0–1.2)
CO2: 25 mmol/L (ref 20–29)
Calcium: 9.2 mg/dL (ref 8.7–10.2)
Chloride: 105 mmol/L (ref 96–106)
Creatinine, Ser: 0.66 mg/dL (ref 0.57–1.00)
GFR calc Af Amer: 112 mL/min/{1.73_m2} (ref >59)
GFR calc non Af Amer: 97 mL/min/{1.73_m2} (ref >59)
Globulin, Total: 2.4 g/dL (ref 1.5–4.5)
Glucose: 109 mg/dL — ABNORMAL HIGH (ref 65–99)
Potassium: 4.4 mmol/L (ref 3.5–5.2)
Sodium: 143 mmol/L (ref 134–144)
Total Protein: 6.5 g/dL (ref 6.0–8.5)

## 2019-01-25 LAB — LIPID PANEL W/O CHOL/HDL RATIO
Cholesterol, Total: 136 mg/dL (ref 100–199)
HDL: 46 mg/dL (ref >39)
LDL Calculated: 78 mg/dL (ref 0–99)
Triglycerides: 58 mg/dL (ref 0–149)
VLDL Cholesterol Cal: 12 mg/dL (ref 5–40)

## 2019-01-25 LAB — HGB A1C W/O EAG: Hgb A1c MFr Bld: 6.5 % — ABNORMAL HIGH (ref 4.8–5.6)

## 2019-02-15 ENCOUNTER — Other Ambulatory Visit: Payer: Self-pay | Admitting: Internal Medicine

## 2019-04-06 ENCOUNTER — Other Ambulatory Visit: Payer: Self-pay | Admitting: Internal Medicine

## 2019-04-06 DIAGNOSIS — M5442 Lumbago with sciatica, left side: Secondary | ICD-10-CM

## 2019-04-11 ENCOUNTER — Other Ambulatory Visit: Payer: Self-pay | Admitting: Internal Medicine

## 2019-04-23 ENCOUNTER — Other Ambulatory Visit: Payer: Self-pay | Admitting: Neurological Surgery

## 2019-04-23 DIAGNOSIS — M961 Postlaminectomy syndrome, not elsewhere classified: Secondary | ICD-10-CM

## 2019-04-29 ENCOUNTER — Ambulatory Visit
Admission: RE | Admit: 2019-04-29 | Discharge: 2019-04-29 | Disposition: A | Discharge status: Home / Self Care | Payer: Medicare Other | Source: Ambulatory Visit | Attending: Neurological Surgery | Admitting: Neurological Surgery

## 2019-04-29 ENCOUNTER — Other Ambulatory Visit: Payer: Self-pay

## 2019-04-29 DIAGNOSIS — M961 Postlaminectomy syndrome, not elsewhere classified: Secondary | ICD-10-CM

## 2019-05-02 ENCOUNTER — Other Ambulatory Visit: Payer: Self-pay | Admitting: Internal Medicine

## 2019-05-02 DIAGNOSIS — F419 Anxiety disorder, unspecified: Secondary | ICD-10-CM

## 2019-05-02 DIAGNOSIS — F329 Major depressive disorder, single episode, unspecified: Secondary | ICD-10-CM

## 2019-05-02 DIAGNOSIS — F32A Depression, unspecified: Secondary | ICD-10-CM

## 2019-05-10 ENCOUNTER — Encounter: Payer: Medicare HMO | Admitting: Internal Medicine

## 2019-05-27 ENCOUNTER — Other Ambulatory Visit: Payer: Self-pay | Admitting: Internal Medicine

## 2019-06-12 ENCOUNTER — Other Ambulatory Visit: Payer: Self-pay

## 2019-06-12 ENCOUNTER — Encounter: Payer: Self-pay | Admitting: Internal Medicine

## 2019-06-12 ENCOUNTER — Other Ambulatory Visit: Payer: Self-pay | Admitting: Internal Medicine

## 2019-06-12 ENCOUNTER — Ambulatory Visit (INDEPENDENT_AMBULATORY_CARE_PROVIDER_SITE_OTHER): Payer: Medicare Other | Admitting: Internal Medicine

## 2019-06-12 VITALS — BP 130/82 | HR 72 | Resp 12 | Ht 62.0 in | Wt 115.0 lb

## 2019-06-12 DIAGNOSIS — F32A Depression, unspecified: Secondary | ICD-10-CM

## 2019-06-12 DIAGNOSIS — Z0000 Encounter for general adult medical examination without abnormal findings: Secondary | ICD-10-CM | POA: Diagnosis not present

## 2019-06-12 DIAGNOSIS — E119 Type 2 diabetes mellitus without complications: Secondary | ICD-10-CM

## 2019-06-12 DIAGNOSIS — Z780 Asymptomatic menopausal state: Secondary | ICD-10-CM

## 2019-06-12 DIAGNOSIS — Z124 Encounter for screening for malignant neoplasm of cervix: Secondary | ICD-10-CM

## 2019-06-12 DIAGNOSIS — G8929 Other chronic pain: Secondary | ICD-10-CM

## 2019-06-12 DIAGNOSIS — F329 Major depressive disorder, single episode, unspecified: Secondary | ICD-10-CM

## 2019-06-12 DIAGNOSIS — M5442 Lumbago with sciatica, left side: Secondary | ICD-10-CM | POA: Insufficient documentation

## 2019-06-12 DIAGNOSIS — Z23 Encounter for immunization: Secondary | ICD-10-CM

## 2019-06-12 DIAGNOSIS — N898 Other specified noninflammatory disorders of vagina: Secondary | ICD-10-CM

## 2019-06-12 DIAGNOSIS — I10 Essential (primary) hypertension: Secondary | ICD-10-CM

## 2019-06-12 LAB — POCT WET PREP WITH KOH
KOH Prep POC: NEGATIVE
Trichomonas, UA: NEGATIVE
Yeast Wet Prep HPF POC: NEGATIVE

## 2019-06-12 NOTE — Patient Instructions (Signed)
Can google "advance directives, [place]"  And bring up form from Secretary of State. Print and fill out Or can go to "5 wishes"  Which is also in Spanish and fill out--this costs $5--perhaps easier to use. Designate a Medical Power of Attorney to speak for you if you are unable to speak for yourself when ill or injured  

## 2019-06-12 NOTE — Progress Notes (Signed)
Subjective:    Patient ID: Roberta Clark, female   DOB: [birth year], 58 y.o.   MRN: [id number]   HPI   CPE with pap  1.  Pap: Last 05/06/2016 and normal.    2.  Mammogram:  Last 09/05/2018.  No family history of breast cancer.  3.  Osteoprevention:  Has had both hips replaced and lumbar spinal surgery.  Has never had a DEXA bone density.  Drinks 2 cups milk daily.  Would drink another to get enough calcium and vitamin D.  Limited activity with chronic back pain--to get an implantable nerve stimulator soon--to find out on November 3rd.    4.  Guaiac Cards:  Never   5.  Colonoscopy:  Underwent colonoscopy 06/29/2018 and normal save for internal hemorrhoids. Prior history of an adenomatous polyp.   6.  Immunizations:   Immunization History  Administered Date(s) Administered  . Influenza Inj Mdck Quad Pf 07/12/2016, 07/10/2017  . Influenza,inj,Quad PF,6+ Mos 06/06/2018  . Influenza,inj,quad, With Preservative 07/15/2017, 06/06/2018  . Pneumococcal Polysaccharide-23 02/02/2016  . Tdap 08/16/2011  Has not had influenza vaccine.   7.  Glucose/Cholesterol:  A1C was 6.5% in June and cholesterol basically at goal then though LDL slightly high at 78.   Lipid Panel     Component Value Date/Time   CHOL 136 01/24/2019 1125   TRIG 58 01/24/2019 1125   HDL 46 01/24/2019 1125   CHOLHDL 4.4 07/25/2012 1413   VLDL 19 07/25/2012 1413   LDLCALC 78 01/24/2019 1125   LDLDIRECT 111 (H) 06/10/2009 1933   LABVLDL 12 01/24/2019 1125     Current Meds  Medication Sig  . amitriptyline (ELAVIL) 25 MG tablet Take 1 tablet (25 mg total) by mouth at bedtime.  . Blood Glucose Monitoring Suppl (ONETOUCH VERIO) w/Device KIT Check blood glucose twice daily before meals  . FLUoxetine (PROZAC) 40 MG capsule TAKE ONE CAPSULE BY MOUTH EVERY DAY  . gabapentin (NEURONTIN) 600 MG tablet Take 1-2 tablets (600-1,200 mg total) by mouth See admin instructions. TAKE ONE TABLET BY MOUTH EVERY MORNING AND AT  MIDDAY,AND THEN TAKE TWO TABLETS BY MOUTH AT BEDTIME  . glipiZIDE (GLUCOTROL) 5 MG tablet TAKE ONE-HALF TABLET BY MOUTH TWICE DAILY WITH MEALS  . glucose blood (ONETOUCH VERIO) test strip Check Blood glucose twice daily before meals  . IBU 800 MG tablet TAKE ONE TABLET BY MOUTH TWICE DAILY  . iron polysaccharides (NIFEREX) 150 MG capsule Take 1 capsule (150 mg total) by mouth daily.  Marland Kitchen lisinopril-hydrochlorothiazide (ZESTORETIC) 20-12.5 MG tablet TAKE ONE TABLET BY MOUTH ONCE DAILY  . Melatonin 3 MG SUBL Place 6 mg under the tongue at bedtime.  . meloxicam (MOBIC) 7.5 MG tablet 2 tabs by mouth once daily  . methocarbamol (ROBAXIN) 500 MG tablet Take 500 mg by mouth every 8 (eight) hours as needed for muscle spasms.   . Omega-3 Fatty Acids (FISH OIL) 1000 MG CAPS Take 1 capsule by mouth 2 (two) times daily.  [name] Check blood glucose twice a day before meals. For Dx E11.9  . potassium chloride (K-DUR) 10 MEQ tablet Take 1 tablet (10 mEq total) by mouth daily.  . rosuvastatin (CRESTOR) 20 MG tablet TAKE ONE TABLET BY MOUTH EVERY DAY with evening meal  . tiZANidine (ZANAFLEX) 4 MG tablet tizanidine 4 mg tablet  take 1 by oral route 3 times every day as needed as needed   Allergies  Allergen Reactions  . Atorvastatin Rash  .  Metformin And Related Nausea And Vomiting   Past Medical History:  Diagnosis Date  . Adenomatous colon polyp 05/13/2013  . Anemia   . Arthritis   . Chronic pain   . Diabetes mellitus without complication ([place]) 3976   Type II - diet controlled  . Family history of adverse reaction to anesthesia    mother - nausea  . Hyperlipidemia 2011   Rash with statin:  not well controlled with Gemfibrozil  . Hypertension 2011  . Insomnia secondary to chronic pain   . Low back pain with left-sided sciatica    Reportedly with lumbar spinal stenosis    Past Surgical History:  Procedure Laterality Date  . BACK SURGERY    . COLONOSCOPY    . LUMBAR  LAMINECTOMY/DECOMPRESSION MICRODISCECTOMY Bilateral 05/27/2016   Procedure: Left Lumbar Four-Five Laminectomy and Foraminotomy;  Surgeon: Eustace Moore, MD;  Location: [facility];  Service: Neurosurgery;  Laterality: Bilateral;  . SPINAL CORD STIMULATOR INSERTION N/A 07/01/2019   Procedure: Spinal cord stimulator via Thoracic Nine laminectomy;  Surgeon: Eustace Moore, MD;  Location: [facility];  Service: Neurosurgery;  Laterality: N/A;  Spinal cord stimulator via Thoracic Nine laminectomy  . TOTAL HIP ARTHROPLASTY Left 10/24/2016   Procedure: LEFT TOTAL HIP ARTHROPLASTY ANTERIOR APPROACH;  Surgeon: Gaynelle Arabian, MD;  Location: [facility];  Service: Orthopedics;  Laterality: Left;  . TOTAL HIP ARTHROPLASTY Right 02/22/2017   Procedure: RIGHT TOTAL HIP ARTHROPLASTY ANTERIOR APPROACH;  Surgeon: Gaynelle Arabian, MD;  Location: [facility];  Service: Orthopedics;  Laterality: Right;  . TUBAL LIGATION  1985    Social History   Socioeconomic History  . Marital status: Single    Spouse name: Not on file  . Number of children: 2  . Years of education: 67  . Highest education level: Associate degree: occupational, Hotel manager, or vocational program  Occupational History  . 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 Smoking status: Current Some Day Smoker    Packs/day: 0.10    Years: 6.00    Pack years: 0.60    Types: Cigarettes    Start date: 12/17/1976  . Smokeless tobacco: Never Used  . Tobacco comment: 1-2 cigs daily 10/19  Substance and Sexual Activity  . Alcohol use: No    Alcohol/week: 0.0 standard drinks  . Drug use: No  . Sexual activity: Yes    Birth control/protection: Post-menopausal  Other Topics Concern  . Not on file  Social History Narrative   Lives alone   Has a significant other--Roberta Clark, who is also a patient.   Children live in town and are supportive.   Originally from [place].   Went to [place] [place].   Social Determinants of Health    Financial Resource Strain: Low Risk   . Difficulty of Paying Living Expenses: Not very hard  Food Insecurity: No Food Insecurity  . Worried About Charity fundraiser in the Last Year: Never true  . Ran Out of Food in the Last Year: Never true  Transportation Needs: No Transportation Needs  . Lack of Transportation (Medical): No  . Lack of Transportation (Non-Medical): No  Physical Activity:   . Days of Exercise per Week: Not on file  . Minutes of Exercise per Session: Not on file  Stress:   . Feeling of Stress : Not on file  Social Connections: Unknown  . Frequency of Communication with Friends and Family: Not on file  . Frequency of Social Gatherings  with Friends and Family: Not on file  . Attends Religious Services: Not on file  . Active Member of Clubs or Organizations: Not on file  . Attends Archivist Meetings: Not on file  . Marital Status: Never married  Intimate Partner Violence: Not At Risk  . Fear of Current or Ex-Partner: No  . Emotionally Abused: No  . Physically Abused: No  . Sexually Abused: No    Family History  Problem Relation Age of Onset  . Heart disease Mother 49       Hx of MI   . Hypertension Mother   . Diabetes Mother   . Diabetes Sister   . Graves' disease Sister   . Lupus Father   . Graves' disease Brother   . Diabetes Brother   . Arthritis Daughter        Not clear if RA  . Hypertension Daughter   . Migraines Daughter   . Epilepsy Son        Started 91 months of age after bacterial meningitis  . GER disease Sister      Review of Systems  Constitutional: Positive for appetite change. Negative for fatigue and fever.  HENT: Positive for dental problem (Needs some work.). Negative for ear pain, hearing loss, rhinorrhea and sore throat.   Eyes: Negative for visual disturbance (Bifocals.  Dr. Katy Fitch.).  Respiratory: Negative for shortness of breath.   Cardiovascular: Negative for chest pain, palpitations and leg swelling.   Gastrointestinal: Negative for abdominal distention, abdominal pain, blood in stool and constipation.  Genitourinary: Negative for dysuria.  Musculoskeletal: Positive for back pain. Negative for arthralgias.  Skin: Negative for rash.  Neurological: Positive for numbness (In bilateral hands at night.). Negative for weakness.  Psychiatric/Behavioral: Negative for dysphoric mood. The patient is not nervous/anxious.       Objective:   BP 130/82 (BP Location: Left Arm, Patient Position: Sitting, Cuff Size: Normal)   Pulse 72   Resp 12   Ht '5\' 2"'  (1.575 m)   Wt 115 lb (52.2 kg)   BMI 21.03 kg/m   Physical Exam  Constitutional: She is oriented to person, place, and time. She appears well-developed and well-nourished.  HENT:  Head: Normocephalic and atraumatic.  Right Ear: Hearing, tympanic membrane, external ear and ear canal normal.  Left Ear: Hearing, tympanic membrane, external ear and ear canal normal.  Nose: Nose normal.  Mouth/Throat: Uvula is midline, oropharynx is clear and moist and mucous membranes are normal.  Eyes: Pupils are equal, round, and reactive to light. Conjunctivae and EOM are normal.  Neck: No thyromegaly present.  Cardiovascular: Normal rate, regular rhythm, S1 normal and S2 normal. Exam reveals no S3, no S4 and no friction rub.  No murmur heard. No carotid bruits.  Carotid, radial, femoral, DP and PT pulses normal and equal.   Pulmonary/Chest: Effort normal and breath sounds normal. Right breast exhibits no inverted nipple, no mass and no nipple discharge. Left breast exhibits no inverted nipple, no mass and no nipple discharge.  Abdominal: Soft. Bowel sounds are normal. She exhibits no mass. There is no hepatosplenomegaly. There is no abdominal tenderness. No hernia.  Genitourinary:    Genitourinary Comments: Normal external female genitalia.   Vaginal and cervical tissue a bit atrophic and friable.  Thin coffee colored discharge at cervical os. No uterine or  adnexal mass or tenderness.     Musculoskeletal:        General: Normal range of motion.     Cervical  back: Full passive range of motion without pain, normal range of motion and neck supple.  Lymphadenopathy:       Head (right side): No submental and no submandibular adenopathy present.       Head (left side): No submental and no submandibular adenopathy present.    She has no cervical adenopathy.    She has no axillary adenopathy.       Right: No inguinal and no supraclavicular adenopathy present.       Left: No inguinal and no supraclavicular adenopathy present.  Neurological: She is alert and oriented to person, place, and time. She has normal strength and normal reflexes. No cranial nerve deficit or sensory deficit. Coordination and gait normal.  Diabetic foot exam was performed with the following findings:   No deformities, ulcerations, or other skin breakdown Normal sensation of 10g monofilament Intact posterior tibialis and dorsalis pedis pulses     Skin: Skin is warm. No rash noted.  Psychiatric: She has a normal mood and affect. Her speech is normal and behavior is normal. Judgment and thought content normal. Cognition and memory are normal.     Assessment & Plan   1.  CPE with pap Guaiac cards x 3 to return in 2 weeks. Influenza vaccination Mammogram not due until January 2021. DEXA ordered.  2.  DM:  Urine microalbumin/crea  3.  Vaginal discharge:  No obvious findings on wet prep.  GC/chlamydia sent.  Patient asymptomatic.  4.  Tobacco use:  Quitting in December as a gift to her grandson when he graduates  5.  DM:  Controlled  6.  Hypertension:  controlled

## 2019-06-13 LAB — MICROALBUMIN / CREATININE URINE RATIO
Creatinine, Urine: 44 mg/dL
Microalb/Creat Ratio: <7 mg/g creat (ref 0–29)
Microalbumin, Urine: <3 ug/mL

## 2019-06-15 LAB — GC/CHLAMYDIA PROBE AMP
Chlamydia trachomatis, NAA: NEGATIVE
Neisseria Gonorrhoeae by PCR: NEGATIVE

## 2019-06-18 LAB — CYTOLOGY - PAP

## 2019-06-24 ENCOUNTER — Other Ambulatory Visit: Payer: Self-pay | Admitting: Neurological Surgery

## 2019-06-27 ENCOUNTER — Ambulatory Visit (HOSPITAL_COMMUNITY)
Admission: RE | Admit: 2019-06-27 | Discharge: 2019-06-27 | Disposition: A | Discharge status: Home / Self Care | Payer: Medicare Other | Source: Ambulatory Visit | Attending: Neurological Surgery | Admitting: Neurological Surgery

## 2019-06-27 ENCOUNTER — Other Ambulatory Visit: Payer: Self-pay

## 2019-06-27 ENCOUNTER — Encounter (HOSPITAL_COMMUNITY)
Admission: RE | Admit: 2019-06-27 | Discharge: 2019-06-27 | Disposition: A | Discharge status: Home / Self Care | Payer: Medicare Other | Source: Ambulatory Visit | Attending: Neurological Surgery | Admitting: Neurological Surgery

## 2019-06-27 ENCOUNTER — Encounter (HOSPITAL_COMMUNITY): Payer: Self-pay

## 2019-06-27 DIAGNOSIS — Z01818 Encounter for other preprocedural examination: Secondary | ICD-10-CM | POA: Diagnosis present

## 2019-06-27 DIAGNOSIS — M961 Postlaminectomy syndrome, not elsewhere classified: Secondary | ICD-10-CM

## 2019-06-27 LAB — HEMOGLOBIN A1C
Hgb A1c MFr Bld: 6.6 % — ABNORMAL HIGH (ref 4.8–5.6)
Mean Plasma Glucose: 142.72 mg/dL

## 2019-06-27 LAB — CBC WITH DIFFERENTIAL/PLATELET
Abs Immature Granulocytes: 0.02 10*3/uL (ref 0.00–0.07)
Basophils Absolute: 0 10*3/uL (ref 0.0–0.1)
Basophils Relative: 1 %
Eosinophils Absolute: 0.3 10*3/uL (ref 0.0–0.5)
Eosinophils Relative: 5 %
HCT: 37.7 % (ref 36.0–46.0)
Hemoglobin: 12.5 g/dL (ref 12.0–15.0)
Immature Granulocytes: 0 %
Lymphocytes Relative: 52 %
Lymphs Abs: 3.8 10*3/uL (ref 0.7–4.0)
MCH: 30.5 pg (ref 26.0–34.0)
MCHC: 33.2 g/dL (ref 30.0–36.0)
MCV: 92 fL (ref 80.0–100.0)
Monocytes Absolute: 0.4 10*3/uL (ref 0.1–1.0)
Monocytes Relative: 5 %
Neutro Abs: 2.7 10*3/uL (ref 1.7–7.7)
Neutrophils Relative %: 37 %
Platelets: 261 10*3/uL (ref 150–400)
RBC: 4.1 MIL/uL (ref 3.87–5.11)
RDW: 13.9 % (ref 11.5–15.5)
WBC: 7.3 10*3/uL (ref 4.0–10.5)
nRBC: 0 % (ref 0.0–0.2)

## 2019-06-27 LAB — BASIC METABOLIC PANEL
Anion gap: 9 (ref 5–15)
BUN: 13 mg/dL (ref 6–20)
CO2: 25 mmol/L (ref 22–32)
Calcium: 9.3 mg/dL (ref 8.9–10.3)
Chloride: 107 mmol/L (ref 98–111)
Creatinine, Ser: 0.74 mg/dL (ref 0.44–1.00)
GFR calc Af Amer: >60 mL/min (ref >60)
GFR calc non Af Amer: >60 mL/min (ref >60)
Glucose, Bld: 75 mg/dL (ref 70–99)
Potassium: 3.1 mmol/L — ABNORMAL LOW (ref 3.5–5.1)
Sodium: 141 mmol/L (ref 135–145)

## 2019-06-27 LAB — PROTIME-INR
INR: 1.1 (ref 0.8–1.2)
Prothrombin Time: 13.8 seconds (ref 11.4–15.2)

## 2019-06-27 LAB — GLUCOSE, CAPILLARY: Glucose-Capillary: 133 mg/dL — ABNORMAL HIGH (ref 70–99)

## 2019-06-27 LAB — SURGICAL PCR SCREEN
MRSA, PCR: NEGATIVE
Staphylococcus aureus: NEGATIVE

## 2019-06-27 MED ORDER — CHLORHEXIDINE GLUCONATE CLOTH 2 % EX PADS: 6.0000 | MEDICATED_PAD | Freq: Once | CUTANEOUS | Status: DC

## 2019-06-27 NOTE — Pre-Procedure Instructions (Signed)
Roberta Clark  06/27/2019     Your procedure is scheduled on Monday, November 16.  Report to [facility], Main Entrance or Entrance "A" at 5:30 AM                  Your surgery or procedure is scheduled for 7:30 A.M.   Call this number if you have problems the morning of surgery:[phone]  This is the number for the Pre- Surgical Desk.    Remember:  Do not eat or drink after midnight Sunday, November 15.     Take these medicines the morning of surgery with A SIP OF WATER :  FLUoxetine (PROZAC)             gabapentin (NEURONTIN)  Take if needed: tiZANidine (ZANAFLEX)  WHAT DO I DO ABOUT MY DIABETES MEDICATION?  DO NOT take glipiZIDE (GLUCOTROL) Sunday 11/15 - EVENING dose.  Do not take glipiZIDE (GLUCOTROL) the morning of surgery   STOP taking Aspirin, Aspirin Products (Goody Powder, Excedrin Migraine), Ibuprofen (Advil),IBU , Naproxen meloxicam (MOBIC),  (Aleve), Vitamins and Herbal Products (ie Fish Oil).  How to Manage Your Diabetes Before and After Surgery  Why is it important to control my blood sugar before and after surgery? . Improving blood sugar levels before and after surgery helps healing and can limit problems. . A way of improving blood sugar control is eating a healthy diet by: o  Eating less sugar and carbohydrates o  Increasing activity/exercise o  Talking with your doctor about reaching your blood sugar goals . High blood sugars (greater than 180 mg/dL) can raise your risk of infections and slow your recovery, so you will need to focus on controlling your diabetes during the weeks before surgery. . Make sure that the doctor who takes care of your diabetes knows about your planned surgery including the date and location.  How do I manage my blood sugar before surgery? . Check your blood sugar at least 4 times a day, starting 2 days before surgery, to make sure that the level is not too high or low. o Check your blood sugar the morning of  your surgery when you wake up and every 2 hours until you get to the Short Stay unit. . If your blood sugar is less than 70 mg/dL, you will need to treat for low blood sugar: o Do not take insulin. o Treat a low blood sugar (less than 70 mg/dL) with  cup of clear juice (cranberry or apple), 4 glucose tablets, OR glucose gel. Recheck blood sugar in 15 minutes after treatment (to make sure it is greater than 70 mg/dL). If your blood sugar is not greater than 70 mg/dL on recheck, call [phone] o  for further instructions. o  . Report your blood sugar to the short stay nurse when you get to Short Stay.  . If you are admitted to the hospital after surgery: o Your blood sugar will be checked by the staff and you will probably be given insulin after surgery (instead of oral diabetes medicines) to make sure you have good blood sugar levels. o The goal for blood sugar control after surgery is 80-180 mg/dL.  Special instructions:   [facility]- Preparing For Surgery  Before surgery, you can play an important role. Because skin is not sterile, your skin needs to be as free of germs as possible. You can reduce the number of germs on your skin by washing with CHG (chlorahexidine gluconate)  Soap before surgery.  CHG is an antiseptic cleaner which kills germs and bonds with the skin to continue killing germs even after washing.    Oral Hygiene is also important to reduce your risk of infection.  Remember - BRUSH YOUR TEETH THE MORNING OF SURGERY WITH YOUR REGULAR TOOTHPASTE  Please do not use if you have an allergy to CHG or antibacterial soaps. If your skin becomes reddened/irritated stop using the CHG.  Do not shave (including legs and underarms) for at least 48 hours prior to first CHG shower. It is OK to shave your face.  Please follow these instructions carefully.   1. Shower the NIGHT BEFORE SURGERY and the MORNING OF SURGERY with CHG.   2. If you chose to wash your hair, wash your hair first  as usual with your normal shampoo.  3. After you shampoo, wash your face and private area with the soap you use at home, then rinse your hair and body thoroughly to remove the shampoo and soap  4. Use CHG as you would any other liquid soap. You can apply CHG directly to the skin and wash gently with a scrungie or a clean washcloth.   5. Apply the CHG Soap to your body ONLY FROM THE NECK DOWN.  Do not use on open wounds or open sores. Avoid contact with your eyes, ears, mouth and genitals (private parts).   6. Wash thoroughly, paying special attention to the area where your surgery will be performed.  7. Thoroughly rinse your body with warm water from the neck down.  8. DO NOT shower/wash with your normal soap after using and rinsing off the CHG Soap.  9. Pat yourself dry with a CLEAN TOWEL.  10. Wear CLEAN PAJAMAS to bed the night before surgery, wear comfortable clothes the morning of surgery  11. Place CLEAN SHEETS on your bed the night of your first shower and DO NOT SLEEP WITH PETS.  Day of Surgery: Shower as instructed above. Do not wear lotions, powders, or perfumes, or deodorant. Please wear clean clothes to the hospital/surgery center.   Remember to brush your teeth WITH YOUR REGULAR TOOTHPASTE.  Do not wear jewelry, make-up or nail polish.  Do not wear lotions, powders, or perfumes, or deodorant.  Do not shave 48 hours prior to surgery.    Do not bring valuables to the hospital.  [facility] is not responsible for any belongings or valuables. Special instructions:   Contacts, dentures or bridgework may not be worn into surgery.  Leave your suitcase in the car.  After surgery it may be brought to your room.  For patients admitted to the hospital, discharge time will be determined by your treatment team.  Patients discharged the day of surgery will not be allowed to drive home.   Please read over the following fact sheets that you were given:  Pain Booklet,  Coughing and  Deep Breathing and Surgical Site Infections.

## 2019-06-27 NOTE — Progress Notes (Addendum)
PCP - DR Bracken[facility] Cardiologist - NA     Chest x-ray - TODAY EKG - TODAY Stress Test - NA ECHO - NA Cardiac Cath NA-   -    Fasting Blood Sugar - 133 Checks Blood Sugar __PRN___ times a day Aspirin Instructions:STOP  ERAS Protcol -NA PRE-SURGERY Ensure or G2- NA  COVID TEST- FOR FRIDAY   Anesthesia review:   HX HTN,DM  Patient denies shortness of breath, fever, cough and chest pain at PAT appointment   All instructions explained to the patient, with a verbal understanding of the material. Patient agrees to go over the instructions while at home for a better understanding. Patient also instructed to self quarantine after being tested for COVID-19. The opportunity to ask questions was provided.

## 2019-06-28 ENCOUNTER — Other Ambulatory Visit: Payer: Self-pay

## 2019-06-28 ENCOUNTER — Other Ambulatory Visit (INDEPENDENT_AMBULATORY_CARE_PROVIDER_SITE_OTHER): Payer: Medicare Other

## 2019-06-28 ENCOUNTER — Other Ambulatory Visit (HOSPITAL_COMMUNITY)
Admission: RE | Admit: 2019-06-28 | Discharge: 2019-06-28 | Disposition: A | Discharge status: Home / Self Care | Payer: Medicare Other | Source: Ambulatory Visit | Attending: Neurological Surgery | Admitting: Neurological Surgery

## 2019-06-28 ENCOUNTER — Other Ambulatory Visit (HOSPITAL_COMMUNITY): Payer: Medicare Other

## 2019-06-28 DIAGNOSIS — Z20828 Contact with and (suspected) exposure to other viral communicable diseases: Secondary | ICD-10-CM | POA: Insufficient documentation

## 2019-06-28 DIAGNOSIS — E119 Type 2 diabetes mellitus without complications: Secondary | ICD-10-CM

## 2019-06-28 DIAGNOSIS — Z01812 Encounter for preprocedural laboratory examination: Secondary | ICD-10-CM | POA: Insufficient documentation

## 2019-06-28 DIAGNOSIS — Z79899 Other long term (current) drug therapy: Secondary | ICD-10-CM

## 2019-06-28 DIAGNOSIS — Z1211 Encounter for screening for malignant neoplasm of colon: Secondary | ICD-10-CM | POA: Diagnosis not present

## 2019-06-28 DIAGNOSIS — E782 Mixed hyperlipidemia: Secondary | ICD-10-CM

## 2019-06-28 LAB — POC HEMOCCULT BLD/STL (HOME/3-CARD/SCREEN)
Card #2 Fecal Occult Blod, POC: NEGATIVE
Card #3 Fecal Occult Blood, POC: NEGATIVE
Fecal Occult Blood, POC: NEGATIVE

## 2019-06-29 LAB — LIPID PANEL W/O CHOL/HDL RATIO
Cholesterol, Total: 164 mg/dL (ref 100–199)
HDL: 45 mg/dL (ref >39)
LDL Chol Calc (NIH): 104 mg/dL — ABNORMAL HIGH (ref 0–99)
Triglycerides: 80 mg/dL (ref 0–149)
VLDL Cholesterol Cal: 15 mg/dL (ref 5–40)

## 2019-06-29 LAB — COMPREHENSIVE METABOLIC PANEL
ALT: 34 IU/L — ABNORMAL HIGH (ref 0–32)
AST: 37 IU/L (ref 0–40)
Albumin/Globulin Ratio: 2 (ref 1.2–2.2)
Albumin: 4.5 g/dL (ref 3.8–4.9)
Alkaline Phosphatase: 93 IU/L (ref 39–117)
BUN/Creatinine Ratio: 26 — ABNORMAL HIGH (ref 9–23)
BUN: 18 mg/dL (ref 6–24)
Bilirubin Total: <0.2 mg/dL (ref 0.0–1.2)
CO2: 22 mmol/L (ref 20–29)
Calcium: 9.3 mg/dL (ref 8.7–10.2)
Chloride: 107 mmol/L — ABNORMAL HIGH (ref 96–106)
Creatinine, Ser: 0.68 mg/dL (ref 0.57–1.00)
GFR calc Af Amer: 111 mL/min/{1.73_m2} (ref >59)
GFR calc non Af Amer: 96 mL/min/{1.73_m2} (ref >59)
Globulin, Total: 2.3 g/dL (ref 1.5–4.5)
Glucose: 118 mg/dL — ABNORMAL HIGH (ref 65–99)
Potassium: 4.5 mmol/L (ref 3.5–5.2)
Sodium: 144 mmol/L (ref 134–144)
Total Protein: 6.8 g/dL (ref 6.0–8.5)

## 2019-06-29 LAB — CBC WITH DIFFERENTIAL/PLATELET
Basophils Absolute: 0 10*3/uL (ref 0.0–0.2)
Basos: 1 %
EOS (ABSOLUTE): 0.3 10*3/uL (ref 0.0–0.4)
Eos: 5 %
Hematocrit: 39.5 % (ref 34.0–46.6)
Hemoglobin: 12.9 g/dL (ref 11.1–15.9)
Immature Grans (Abs): 0 10*3/uL (ref 0.0–0.1)
Immature Granulocytes: 0 %
Lymphocytes Absolute: 3.2 10*3/uL — ABNORMAL HIGH (ref 0.7–3.1)
Lymphs: 48 %
MCH: 30 pg (ref 26.6–33.0)
MCHC: 32.7 g/dL (ref 31.5–35.7)
MCV: 92 fL (ref 79–97)
Monocytes Absolute: 0.4 10*3/uL (ref 0.1–0.9)
Monocytes: 6 %
Neutrophils Absolute: 2.6 10*3/uL (ref 1.4–7.0)
Neutrophils: 40 %
Platelets: 267 10*3/uL (ref 150–450)
RBC: 4.3 x10E6/uL (ref 3.77–5.28)
RDW: 12.9 % (ref 11.7–15.4)
WBC: 6.6 10*3/uL (ref 3.4–10.8)

## 2019-06-29 LAB — SARS CORONAVIRUS 2 (TAT 6-24 HRS): SARS Coronavirus 2: NEGATIVE

## 2019-06-29 LAB — HGB A1C W/O EAG: Hgb A1c MFr Bld: 6.6 % — ABNORMAL HIGH (ref 4.8–5.6)

## 2019-06-30 NOTE — Anesthesia Preprocedure Evaluation (Addendum)
Anesthesia Evaluation  Patient identified by MRN, date of birth, ID band Patient awake    Reviewed: Allergy & Precautions, NPO status , Patient's Chart, lab work & pertinent test results  Airway Mallampati: I  TM Distance: >3 FB Neck ROM: Full    Dental  (+) Teeth Intact, Dental Advisory Given, Missing,    Pulmonary Current Smoker and Patient abstained from smoking.,    Pulmonary exam normal        Cardiovascular hypertension, Pt. on medications Normal cardiovascular exam Rhythm:Regular  ECG: NSR, rate 87   Neuro/Psych PSYCHIATRIC DISORDERS Depression    GI/Hepatic negative GI ROS, Neg liver ROS,   Endo/Other  diabetes, Type 2, Oral Hypoglycemic Agents  Renal/GU negative Renal ROS     Musculoskeletal  (+) Arthritis , Osteoarthritis,    Abdominal   Peds negative pediatric ROS (+)  Hematology  (+) Blood dyscrasia, anemia ,   Anesthesia Other Findings Low back pain Hyperlipidemia   Reproductive/Obstetrics                             Anesthesia Physical  Anesthesia Plan  ASA: III  Anesthesia Plan: General   Post-op Pain Management:    Induction: Intravenous  PONV Risk Score and Plan: 3 and Ondansetron, Dexamethasone, Midazolam and Treatment may vary due to age or medical condition  Airway Management Planned: Oral ETT  Additional Equipment: None  Intra-op Plan:   Post-operative Plan: Extubation in OR  Informed Consent: I have reviewed the patients History and Physical, chart, labs and discussed the procedure including the risks, benefits and alternatives for the proposed anesthesia with the patient or authorized representative who has indicated his/her understanding and acceptance.     Dental advisory given  Plan Discussed with: CRNA  Anesthesia Plan Comments:         Anesthesia Quick Evaluation

## 2019-07-01 ENCOUNTER — Encounter (HOSPITAL_COMMUNITY): Payer: Self-pay | Admitting: Certified Registered"

## 2019-07-01 ENCOUNTER — Encounter (HOSPITAL_COMMUNITY)
Admission: RE | Disposition: A | Discharge status: Home / Self Care | Payer: Self-pay | Source: Home / Self Care | Attending: Neurological Surgery

## 2019-07-01 ENCOUNTER — Ambulatory Visit (HOSPITAL_COMMUNITY): Payer: Medicare Other

## 2019-07-01 ENCOUNTER — Ambulatory Visit (HOSPITAL_COMMUNITY): Payer: Medicare Other | Admitting: Physician Assistant

## 2019-07-01 ENCOUNTER — Ambulatory Visit (HOSPITAL_COMMUNITY)
Admission: RE | Admit: 2019-07-01 | Discharge: 2019-07-02 | Disposition: A | Discharge status: Home / Self Care | Payer: Medicare Other | Attending: Neurological Surgery | Admitting: Neurological Surgery

## 2019-07-01 ENCOUNTER — Other Ambulatory Visit: Payer: Self-pay

## 2019-07-01 ENCOUNTER — Ambulatory Visit (HOSPITAL_COMMUNITY): Payer: Medicare Other | Admitting: Anesthesiology

## 2019-07-01 DIAGNOSIS — Z791 Long term (current) use of non-steroidal anti-inflammatories (NSAID): Secondary | ICD-10-CM | POA: Diagnosis not present

## 2019-07-01 DIAGNOSIS — Z96643 Presence of artificial hip joint, bilateral: Secondary | ICD-10-CM | POA: Diagnosis not present

## 2019-07-01 DIAGNOSIS — Z79899 Other long term (current) drug therapy: Secondary | ICD-10-CM | POA: Insufficient documentation

## 2019-07-01 DIAGNOSIS — I10 Essential (primary) hypertension: Secondary | ICD-10-CM | POA: Insufficient documentation

## 2019-07-01 DIAGNOSIS — E785 Hyperlipidemia, unspecified: Secondary | ICD-10-CM | POA: Diagnosis not present

## 2019-07-01 DIAGNOSIS — F329 Major depressive disorder, single episode, unspecified: Secondary | ICD-10-CM | POA: Insufficient documentation

## 2019-07-01 DIAGNOSIS — Z7984 Long term (current) use of oral hypoglycemic drugs: Secondary | ICD-10-CM | POA: Insufficient documentation

## 2019-07-01 DIAGNOSIS — M961 Postlaminectomy syndrome, not elsewhere classified: Secondary | ICD-10-CM | POA: Insufficient documentation

## 2019-07-01 DIAGNOSIS — Z419 Encounter for procedure for purposes other than remedying health state, unspecified: Secondary | ICD-10-CM

## 2019-07-01 DIAGNOSIS — F17210 Nicotine dependence, cigarettes, uncomplicated: Secondary | ICD-10-CM | POA: Diagnosis not present

## 2019-07-01 DIAGNOSIS — E119 Type 2 diabetes mellitus without complications: Secondary | ICD-10-CM | POA: Diagnosis not present

## 2019-07-01 DIAGNOSIS — Z9689 Presence of other specified functional implants: Secondary | ICD-10-CM

## 2019-07-01 HISTORY — PX: SPINAL CORD STIMULATOR INSERTION: SHX5378

## 2019-07-01 LAB — GLUCOSE, CAPILLARY
Glucose-Capillary: 124 mg/dL — ABNORMAL HIGH (ref 70–99)
Glucose-Capillary: 116 mg/dL — ABNORMAL HIGH (ref 70–99)
Glucose-Capillary: 125 mg/dL — ABNORMAL HIGH (ref 70–99)
Glucose-Capillary: 173 mg/dL — ABNORMAL HIGH (ref 70–99)
Glucose-Capillary: 140 mg/dL — ABNORMAL HIGH (ref 70–99)

## 2019-07-01 SURGERY — INSERTION, SPINAL CORD STIMULATOR, LUMBAR
Anesthesia: General | Site: Back

## 2019-07-01 MED ORDER — MIDAZOLAM HCL 2 MG/2ML IJ SOLN
INTRAMUSCULAR | Status: AC
  Filled 2019-07-01: qty 2

## 2019-07-01 MED ORDER — ONDANSETRON HCL 4 MG/2ML IJ SOLN: 4.0000 mg | Freq: Four times a day (QID) | INTRAMUSCULAR | Status: DC | PRN

## 2019-07-01 MED ORDER — PHENYLEPHRINE 40 MCG/ML (10ML) SYRINGE FOR IV PUSH (FOR BLOOD PRESSURE SUPPORT)
PREFILLED_SYRINGE | INTRAVENOUS | Status: DC | PRN
  Administered 2019-07-01 (×5): 80 ug via INTRAVENOUS

## 2019-07-01 MED ORDER — ONDANSETRON HCL 4 MG/2ML IJ SOLN
INTRAMUSCULAR | Status: DC | PRN
  Administered 2019-07-01: 4 mg via INTRAVENOUS

## 2019-07-01 MED ORDER — SENNA 8.6 MG PO TABS
1.0000 | ORAL_TABLET | Freq: Two times a day (BID) | ORAL | Status: DC
  Administered 2019-07-01 – 2019-07-02 (×3): 8.6 mg via ORAL
  Filled 2019-07-01 (×3): qty 1

## 2019-07-01 MED ORDER — HYDROMORPHONE HCL 1 MG/ML IJ SOLN
0.2500 mg | INTRAMUSCULAR | Status: DC | PRN
  Administered 2019-07-01 (×2): 0.25 mg via INTRAVENOUS
  Administered 2019-07-01: 10:00:00 0.5 mg via INTRAVENOUS

## 2019-07-01 MED ORDER — SODIUM CHLORIDE 0.9 % IV SOLN: 250.0000 mL | INTRAVENOUS | Status: DC

## 2019-07-01 MED ORDER — LISINOPRIL 20 MG PO TABS
20.0000 mg | ORAL_TABLET | Freq: Every day | ORAL | Status: DC
  Administered 2019-07-01 – 2019-07-02 (×2): 20 mg via ORAL
  Filled 2019-07-01 (×2): qty 1

## 2019-07-01 MED ORDER — AMITRIPTYLINE HCL 25 MG PO TABS
25.0000 mg | ORAL_TABLET | Freq: Every day | ORAL | Status: DC
  Administered 2019-07-01: 21:00:00 25 mg via ORAL
  Filled 2019-07-01 (×2): qty 1

## 2019-07-01 MED ORDER — HYDROCODONE-ACETAMINOPHEN 5-325 MG PO TABS
1.0000 | ORAL_TABLET | ORAL | Status: DC | PRN
  Administered 2019-07-01 – 2019-07-02 (×5): 2 via ORAL
  Filled 2019-07-01 (×5): qty 2

## 2019-07-01 MED ORDER — ONDANSETRON HCL 4 MG/2ML IJ SOLN
INTRAMUSCULAR | Status: AC
  Filled 2019-07-01: qty 2

## 2019-07-01 MED ORDER — PROMETHAZINE HCL 25 MG/ML IJ SOLN: 6.2500 mg | INTRAMUSCULAR | Status: DC | PRN

## 2019-07-01 MED ORDER — SUCCINYLCHOLINE CHLORIDE 200 MG/10ML IV SOSY
PREFILLED_SYRINGE | INTRAVENOUS | Status: AC
  Filled 2019-07-01: qty 10

## 2019-07-01 MED ORDER — FLUOXETINE HCL 20 MG PO CAPS
40.0000 mg | ORAL_CAPSULE | Freq: Every day | ORAL | Status: DC
  Administered 2019-07-02: 40 mg via ORAL
  Filled 2019-07-01: qty 2

## 2019-07-01 MED ORDER — ACETAMINOPHEN 325 MG PO TABS: 650.0000 mg | ORAL_TABLET | ORAL | Status: DC | PRN

## 2019-07-01 MED ORDER — SODIUM CHLORIDE 0.9% FLUSH
3.0000 mL | Freq: Two times a day (BID) | INTRAVENOUS | Status: DC
  Administered 2019-07-01: 3 mL via INTRAVENOUS

## 2019-07-01 MED ORDER — MIDAZOLAM HCL 5 MG/5ML IJ SOLN
INTRAMUSCULAR | Status: DC | PRN
  Administered 2019-07-01: 2 mg via INTRAVENOUS

## 2019-07-01 MED ORDER — BUPIVACAINE HCL (PF) 0.25 % IJ SOLN
INTRAMUSCULAR | Status: AC
  Filled 2019-07-01: qty 30

## 2019-07-01 MED ORDER — KETOROLAC TROMETHAMINE 30 MG/ML IJ SOLN
INTRAMUSCULAR | Status: AC
  Filled 2019-07-01: qty 1

## 2019-07-01 MED ORDER — INSULIN ASPART 100 UNIT/ML ~~LOC~~ SOLN: 0.0000 [IU] | Freq: Every day | SUBCUTANEOUS | Status: DC

## 2019-07-01 MED ORDER — SUGAMMADEX SODIUM 200 MG/2ML IV SOLN
INTRAVENOUS | Status: DC | PRN
  Administered 2019-07-01: 200 mg via INTRAVENOUS

## 2019-07-01 MED ORDER — INSULIN ASPART 100 UNIT/ML ~~LOC~~ SOLN
0.0000 [IU] | Freq: Three times a day (TID) | SUBCUTANEOUS | Status: DC
  Administered 2019-07-01: 3 [IU] via SUBCUTANEOUS
  Administered 2019-07-01: 13:00:00 2 [IU] via SUBCUTANEOUS

## 2019-07-01 MED ORDER — GLYCOPYRROLATE PF 0.2 MG/ML IJ SOSY
PREFILLED_SYRINGE | INTRAMUSCULAR | Status: AC
  Filled 2019-07-01: qty 1

## 2019-07-01 MED ORDER — KETOROLAC TROMETHAMINE 30 MG/ML IJ SOLN
INTRAMUSCULAR | Status: DC | PRN
  Administered 2019-07-01: 30 mg via INTRAVENOUS

## 2019-07-01 MED ORDER — PROPOFOL 10 MG/ML IV BOLUS
INTRAVENOUS | Status: DC | PRN
  Administered 2019-07-01: 100 mg via INTRAVENOUS

## 2019-07-01 MED ORDER — METHOCARBAMOL 500 MG PO TABS: 500.0000 mg | ORAL_TABLET | Freq: Three times a day (TID) | ORAL | Status: DC | PRN

## 2019-07-01 MED ORDER — POTASSIUM CHLORIDE IN NACL 20-0.9 MEQ/L-% IV SOLN: INTRAVENOUS | Status: DC

## 2019-07-01 MED ORDER — ROCURONIUM BROMIDE 50 MG/5ML IV SOSY
PREFILLED_SYRINGE | INTRAVENOUS | Status: DC | PRN
  Administered 2019-07-01: 40 mg via INTRAVENOUS
  Administered 2019-07-01: 10 mg via INTRAVENOUS

## 2019-07-01 MED ORDER — ACETAMINOPHEN 650 MG RE SUPP: 650.0000 mg | RECTAL | Status: DC | PRN

## 2019-07-01 MED ORDER — DEXAMETHASONE SODIUM PHOSPHATE 10 MG/ML IJ SOLN
INTRAMUSCULAR | Status: DC | PRN
  Administered 2019-07-01: 10 mg via INTRAVENOUS

## 2019-07-01 MED ORDER — BUPIVACAINE HCL (PF) 0.25 % IJ SOLN
INTRAMUSCULAR | Status: DC | PRN
  Administered 2019-07-01: 7 mL

## 2019-07-01 MED ORDER — CEFAZOLIN SODIUM-DEXTROSE 2-4 GM/100ML-% IV SOLN
2.0000 g | INTRAVENOUS | Status: AC
  Administered 2019-07-01: 2 g via INTRAVENOUS

## 2019-07-01 MED ORDER — PHENYLEPHRINE 40 MCG/ML (10ML) SYRINGE FOR IV PUSH (FOR BLOOD PRESSURE SUPPORT)
PREFILLED_SYRINGE | INTRAVENOUS | Status: AC
  Filled 2019-07-01: qty 10

## 2019-07-01 MED ORDER — ONDANSETRON HCL 4 MG PO TABS: 4.0000 mg | ORAL_TABLET | Freq: Four times a day (QID) | ORAL | Status: DC | PRN

## 2019-07-01 MED ORDER — FENTANYL CITRATE (PF) 250 MCG/5ML IJ SOLN
INTRAMUSCULAR | Status: AC
  Filled 2019-07-01: qty 5

## 2019-07-01 MED ORDER — ACETAMINOPHEN 10 MG/ML IV SOLN
INTRAVENOUS | Status: DC | PRN
  Administered 2019-07-01: 1000 mg via INTRAVENOUS

## 2019-07-01 MED ORDER — ROCURONIUM BROMIDE 10 MG/ML (PF) SYRINGE
PREFILLED_SYRINGE | INTRAVENOUS | Status: AC
  Filled 2019-07-01: qty 10

## 2019-07-01 MED ORDER — 0.9 % SODIUM CHLORIDE (POUR BTL) OPTIME
TOPICAL | Status: DC | PRN
  Administered 2019-07-01: 1000 mL

## 2019-07-01 MED ORDER — THROMBIN 5000 UNITS EX SOLR
CUTANEOUS | Status: AC
  Filled 2019-07-01: qty 5000

## 2019-07-01 MED ORDER — LIDOCAINE 2% (20 MG/ML) 5 ML SYRINGE
INTRAMUSCULAR | Status: DC | PRN
  Administered 2019-07-01: 60 mg via INTRAVENOUS

## 2019-07-01 MED ORDER — DEXAMETHASONE SODIUM PHOSPHATE 10 MG/ML IJ SOLN: 10.0000 mg | Freq: Once | INTRAMUSCULAR | Status: DC

## 2019-07-01 MED ORDER — ACETAMINOPHEN 10 MG/ML IV SOLN
INTRAVENOUS | Status: AC
  Filled 2019-07-01: qty 100

## 2019-07-01 MED ORDER — GABAPENTIN 600 MG PO TABS
600.0000 mg | ORAL_TABLET | Freq: Two times a day (BID) | ORAL | Status: DC
  Administered 2019-07-01 – 2019-07-02 (×2): 600 mg via ORAL
  Filled 2019-07-01 (×2): qty 1

## 2019-07-01 MED ORDER — SODIUM CHLORIDE 0.9 % IV SOLN
INTRAVENOUS | Status: DC | PRN
  Administered 2019-07-01: 500 mL

## 2019-07-01 MED ORDER — VANCOMYCIN HCL 1000 MG IV SOLR
INTRAVENOUS | Status: AC
  Filled 2019-07-01: qty 1000

## 2019-07-01 MED ORDER — VANCOMYCIN HCL 1 G IV SOLR
INTRAVENOUS | Status: DC | PRN
  Administered 2019-07-01: 1000 mg

## 2019-07-01 MED ORDER — CEFAZOLIN SODIUM-DEXTROSE 2-4 GM/100ML-% IV SOLN
2.0000 g | Freq: Three times a day (TID) | INTRAVENOUS | Status: AC
  Administered 2019-07-01 – 2019-07-02 (×2): 2 g via INTRAVENOUS
  Filled 2019-07-01 (×2): qty 100

## 2019-07-01 MED ORDER — FENTANYL CITRATE (PF) 100 MCG/2ML IJ SOLN
INTRAMUSCULAR | Status: DC | PRN
  Administered 2019-07-01 (×5): 50 ug via INTRAVENOUS

## 2019-07-01 MED ORDER — GLYCOPYRROLATE PF 0.2 MG/ML IJ SOSY
PREFILLED_SYRINGE | INTRAMUSCULAR | Status: DC | PRN
  Administered 2019-07-01: .1 mg via INTRAVENOUS

## 2019-07-01 MED ORDER — MORPHINE SULFATE (PF) 2 MG/ML IV SOLN
2.0000 mg | INTRAVENOUS | Status: DC | PRN
  Administered 2019-07-01: 2 mg via INTRAVENOUS
  Filled 2019-07-01: qty 1

## 2019-07-01 MED ORDER — HYDROCODONE-ACETAMINOPHEN 7.5-325 MG PO TABS: 1.0000 | ORAL_TABLET | Freq: Four times a day (QID) | ORAL | Status: DC

## 2019-07-01 MED ORDER — MEPERIDINE HCL 25 MG/ML IJ SOLN: 6.2500 mg | INTRAMUSCULAR | Status: DC | PRN

## 2019-07-01 MED ORDER — GABAPENTIN 600 MG PO TABS: 1200.0000 mg | ORAL_TABLET | Freq: Three times a day (TID) | ORAL | Status: DC

## 2019-07-01 MED ORDER — HYDROCHLOROTHIAZIDE 12.5 MG PO CAPS
12.5000 mg | ORAL_CAPSULE | Freq: Every day | ORAL | Status: DC
  Administered 2019-07-01 – 2019-07-02 (×2): 12.5 mg via ORAL
  Filled 2019-07-01 (×2): qty 1

## 2019-07-01 MED ORDER — METHOCARBAMOL 500 MG PO TABS
500.0000 mg | ORAL_TABLET | Freq: Four times a day (QID) | ORAL | Status: DC | PRN
  Administered 2019-07-01 – 2019-07-02 (×4): 500 mg via ORAL
  Filled 2019-07-01 (×4): qty 1

## 2019-07-01 MED ORDER — LACTATED RINGERS IV SOLN
INTRAVENOUS | Status: DC | PRN
  Administered 2019-07-01: 07:00:00 via INTRAVENOUS

## 2019-07-01 MED ORDER — GLIPIZIDE 5 MG PO TABS
2.5000 mg | ORAL_TABLET | Freq: Two times a day (BID) | ORAL | Status: DC
  Administered 2019-07-01 – 2019-07-02 (×2): 2.5 mg via ORAL
  Filled 2019-07-01 (×2): qty 1

## 2019-07-01 MED ORDER — MENTHOL 3 MG MT LOZG: 1.0000 | LOZENGE | OROMUCOSAL | Status: DC | PRN

## 2019-07-01 MED ORDER — PHENOL 1.4 % MT LIQD: 1.0000 | OROMUCOSAL | Status: DC | PRN

## 2019-07-01 MED ORDER — DEXAMETHASONE SODIUM PHOSPHATE 10 MG/ML IJ SOLN
INTRAMUSCULAR | Status: AC
  Filled 2019-07-01: qty 1

## 2019-07-01 MED ORDER — HYDROMORPHONE HCL 1 MG/ML IJ SOLN
INTRAMUSCULAR | Status: AC
  Filled 2019-07-01: qty 1

## 2019-07-01 MED ORDER — LIDOCAINE 2% (20 MG/ML) 5 ML SYRINGE
INTRAMUSCULAR | Status: AC
  Filled 2019-07-01: qty 5

## 2019-07-01 MED ORDER — SODIUM CHLORIDE 0.9% FLUSH: 3.0000 mL | INTRAVENOUS | Status: DC | PRN

## 2019-07-01 MED ORDER — LISINOPRIL-HYDROCHLOROTHIAZIDE 20-12.5 MG PO TABS: 1.0000 | ORAL_TABLET | Freq: Every day | ORAL | Status: DC

## 2019-07-01 MED ORDER — THROMBIN 5000 UNITS EX SOLR
OROMUCOSAL | Status: DC | PRN
  Administered 2019-07-01: 5 mL

## 2019-07-01 MED ORDER — GABAPENTIN 600 MG PO TABS
1200.0000 mg | ORAL_TABLET | Freq: Every day | ORAL | Status: DC
  Administered 2019-07-01: 1200 mg via ORAL
  Filled 2019-07-01: qty 2

## 2019-07-01 MED ORDER — PROPOFOL 10 MG/ML IV BOLUS
INTRAVENOUS | Status: AC
  Filled 2019-07-01: qty 20

## 2019-07-01 SURGICAL SUPPLY — 56 items
BAG DECANTER FOR FLEXI CONT (MISCELLANEOUS) ×3 IMPLANT
BENZOIN TINCTURE PRP APPL 2/3 (GAUZE/BANDAGES/DRESSINGS) ×3 IMPLANT
BUR MATCHSTICK NEURO 3.0 LAGG (BURR) ×3 IMPLANT
CANISTER SUCT 3000ML PPV (MISCELLANEOUS) ×3 IMPLANT
CARTRIDGE OIL MAESTRO DRILL (MISCELLANEOUS) ×1 IMPLANT
CLOSURE WOUND 1/2 X4 (GAUZE/BANDAGES/DRESSINGS) ×1
COVER WAND RF STERILE (DRAPES) ×3 IMPLANT
DERMABOND ADVANCED (GAUZE/BANDAGES/DRESSINGS) ×2
DERMABOND ADVANCED .7 DNX12 (GAUZE/BANDAGES/DRESSINGS) ×1 IMPLANT
DIFFUSER DRILL AIR PNEUMATIC (MISCELLANEOUS) ×3 IMPLANT
DRAPE C-ARM 42X72 X-RAY (DRAPES) ×6 IMPLANT
DRAPE LAPAROTOMY 100X72X124 (DRAPES) ×3 IMPLANT
DRAPE SURG 17X23 STRL (DRAPES) ×3 IMPLANT
DRSG OPSITE POSTOP 4X6 (GAUZE/BANDAGES/DRESSINGS) ×6 IMPLANT
DURAPREP 26ML APPLICATOR (WOUND CARE) ×3 IMPLANT
ELECT REM PT RETURN 9FT ADLT (ELECTROSURGICAL) ×3
ELECTRODE REM PT RTRN 9FT ADLT (ELECTROSURGICAL) ×1 IMPLANT
GAUZE 4X4 16PLY RFD (DISPOSABLE) IMPLANT
GENERATOR NEUROSTIMULATOR (Neurostimulator) ×3 IMPLANT
GLOVE BIO SURGEON STRL SZ7 (GLOVE) IMPLANT
GLOVE BIO SURGEON STRL SZ8 (GLOVE) ×3 IMPLANT
GLOVE BIOGEL PI IND STRL 7.0 (GLOVE) IMPLANT
GLOVE BIOGEL PI INDICATOR 7.0 (GLOVE)
GOWN STRL REUS W/ TWL LRG LVL3 (GOWN DISPOSABLE) IMPLANT
GOWN STRL REUS W/ TWL XL LVL3 (GOWN DISPOSABLE) ×1 IMPLANT
GOWN STRL REUS W/TWL 2XL LVL3 (GOWN DISPOSABLE) IMPLANT
GOWN STRL REUS W/TWL LRG LVL3 (GOWN DISPOSABLE)
GOWN STRL REUS W/TWL XL LVL3 (GOWN DISPOSABLE) ×2
HEMOSTAT POWDER KIT SURGIFOAM (HEMOSTASIS) IMPLANT
KIT BASIN OR (CUSTOM PROCEDURE TRAY) ×3 IMPLANT
KIT CHARGING (KITS) ×2
KIT CHARGING PRECISION NEURO (KITS) ×1 IMPLANT
KIT REMOTE CONTROL 112802 FREE (KITS) ×3 IMPLANT
KIT TURNOVER KIT B (KITS) ×3 IMPLANT
LEAD COVER EDGE 50CM STIM KIT (Lead) ×3 IMPLANT
NEEDLE HYPO 25X1 1.5 SAFETY (NEEDLE) ×3 IMPLANT
NEEDLE SPNL 20GX3.5 QUINCKE YW (NEEDLE) IMPLANT
NS IRRIG 1000ML POUR BTL (IV SOLUTION) ×3 IMPLANT
OIL CARTRIDGE MAESTRO DRILL (MISCELLANEOUS) ×3
PACK LAMINECTOMY NEURO (CUSTOM PROCEDURE TRAY) ×3 IMPLANT
PAD ARMBOARD 7.5X6 YLW CONV (MISCELLANEOUS) ×9 IMPLANT
RUBBERBAND STERILE (MISCELLANEOUS) ×6 IMPLANT
SPONGE SURGIFOAM ABS GEL SZ50 (HEMOSTASIS) ×3 IMPLANT
STAPLER VISISTAT 35W (STAPLE) ×3 IMPLANT
STRIP CLOSURE SKIN 1/2X4 (GAUZE/BANDAGES/DRESSINGS) ×2 IMPLANT
SUT SILK 3 0 (SUTURE) ×2
SUT SILK 3 0 SH 30 (SUTURE) ×3 IMPLANT
SUT SILK 3-0 FS1 18XBRD (SUTURE) ×1 IMPLANT
SUT VIC AB 0 CT1 18XCR BRD8 (SUTURE) ×1 IMPLANT
SUT VIC AB 0 CT1 8-18 (SUTURE) ×2
SUT VIC AB 2-0 CP2 18 (SUTURE) ×3 IMPLANT
SUT VIC AB 3-0 SH 8-18 (SUTURE) ×6 IMPLANT
TOOL LONG TUNNEL (SPINAL CORD STIMULATOR) ×3 IMPLANT
TOWEL GREEN STERILE (TOWEL DISPOSABLE) ×3 IMPLANT
TOWEL GREEN STERILE FF (TOWEL DISPOSABLE) ×3 IMPLANT
WATER STERILE IRR 1000ML POUR (IV SOLUTION) ×3 IMPLANT

## 2019-07-01 NOTE — H&P (Signed)
Subjective: Patient is a 58 y.o. female admitted for failed back syndrome. Onset of symptoms was a few years ago, unchanged since that time.  The pain is rated severe, and is located at the across the lower back and radiates to leg. The pain is described as aching and occurs all day. The symptoms have been progressive. Symptoms are exacerbated by nothing in particular. MRI or CT showed previous surgery . Did well with scs trial  Past Medical History:  Diagnosis Date  . Adenomatous colon polyp 05/13/2013  . Anemia   . Arthritis   . Chronic pain   . Diabetes mellitus without complication ([place]) 1324   Type II - diet controlled  . Family history of adverse reaction to anesthesia    mother - nausea  . Hyperlipidemia 2011   Rash with statin:  not well controlled with Gemfibrozil  . Hypertension 2011  . Insomnia secondary to chronic pain   . Low back pain with left-sided sciatica    Reportedly with lumbar spinal stenosis    Past Surgical History:  Procedure Laterality Date  . BACK SURGERY    . COLONOSCOPY    . LUMBAR LAMINECTOMY/DECOMPRESSION MICRODISCECTOMY Bilateral 05/27/2016   Procedure: Left Lumbar Four-Five Laminectomy and Foraminotomy;  Surgeon: Eustace Moore, MD;  Location: [facility];  Service: Neurosurgery;  Laterality: Bilateral;  . TOTAL HIP ARTHROPLASTY Left 10/24/2016   Procedure: LEFT TOTAL HIP ARTHROPLASTY ANTERIOR APPROACH;  Surgeon: Gaynelle Arabian, MD;  Location: [facility];  Service: Orthopedics;  Laterality: Left;  . TOTAL HIP ARTHROPLASTY Right 02/22/2017   Procedure: RIGHT TOTAL HIP ARTHROPLASTY ANTERIOR APPROACH;  Surgeon: Gaynelle Arabian, MD;  Location: [facility];  Service: Orthopedics;  Laterality: Right;  . TUBAL LIGATION  1985    Prior to Admission medications   Medication Sig Start Date End Date Taking? Authorizing Provider  amitriptyline (ELAVIL) 25 MG tablet Take 1 tablet (25 mg total) by mouth at bedtime. 12/04/18  Yes Mack Hook, MD  FLUoxetine (PROZAC) 40 MG  capsule TAKE ONE CAPSULE BY MOUTH EVERY DAY Patient taking differently: Take 40 mg by mouth daily.  05/02/19  Yes Mack Hook, MD  gabapentin (NEURONTIN) 600 MG tablet Take 1-2 tablets (600-1,200 mg total) by mouth See admin instructions. TAKE ONE TABLET BY MOUTH EVERY MORNING AND AT MIDDAY,AND THEN TAKE TWO TABLETS BY MOUTH AT BEDTIME Patient taking differently: Take 1,200 mg by mouth 3 (three) times daily. TAKE ONE TABLET BY MOUTH EVERY MORNING AND AT [facility] THEN TAKE TWO TABLETS BY MOUTH AT BEDTIME 01/18/19  Yes Mack Hook, MD  glipiZIDE (GLUCOTROL) 5 MG tablet TAKE ONE-HALF TABLET BY MOUTH TWICE DAILY WITH MEALS Patient taking differently: Take 2.5 mg by mouth 2 (two) times daily before a meal.  01/18/19  Yes Mack Hook, MD  IBU 800 MG tablet TAKE ONE TABLET BY MOUTH TWICE DAILY Patient taking differently: Take 800 mg by mouth 2 (two) times daily.  04/08/19  Yes Mack Hook, MD  lisinopril-hydrochlorothiazide (ZESTORETIC) 20-12.5 MG tablet TAKE ONE TABLET BY MOUTH ONCE DAILY Patient taking differently: Take 1 tablet by mouth daily.  05/27/19  Yes Mack Hook, MD  Melatonin 3 MG SUBL Place 6 mg under the tongue at bedtime.   Yes [provider]  meloxicam (MOBIC) 7.5 MG tablet 2 tabs by mouth once daily Patient taking differently: Take 15 mg by mouth daily. 2 tabs by mouth once dail 12/04/18  Yes Mack Hook, MD  methocarbamol (ROBAXIN) 500 MG tablet Take 500 mg by mouth every 8 (  eight) hours as needed for muscle spasms.  01/02/18  Yes [provider]  Naproxen Sod-diphenhydrAMINE (ALEVE PM) 220-25 MG TABS Take 2 tablets by mouth at bedtime.   Yes [provider]  Omega-3 Fatty Acids (FISH OIL) 1000 MG CAPS Take 1,000 mg by mouth 2 (two) times daily.    Yes [provider]  potassium chloride (K-DUR) 10 MEQ tablet Take 1 tablet (10 mEq total) by mouth daily. Patient taking differently: Take 10 mEq by mouth daily.   02/18/19  Yes Mack Hook, MD  tiZANidine (ZANAFLEX) 2 MG tablet Take 2 mg by mouth every 6 (six) hours as needed for muscle spasms.    Yes [provider]  Blood Glucose Monitoring Suppl (ONETOUCH VERIO) w/Device KIT Check blood glucose twice daily before meals 06/05/18   Mack Hook, MD  glucose blood (ONETOUCH VERIO) test strip Check Blood glucose twice daily before meals 12/04/18   Mack Hook, MD  iron polysaccharides (NIFEREX) 150 MG capsule Take 1 capsule (150 mg total) by mouth daily. Patient not taking: Reported on 06/26/2019 10/26/16   Dara Lords, Alexzandrew L, PA-C  OneTouch Delica Lancets 31V MISC Check blood glucose twice a day before meals. For Dx E11.9 12/04/18   Mack Hook, MD  rosuvastatin (CRESTOR) 20 MG tablet TAKE ONE TABLET BY MOUTH EVERY DAY with evening meal Patient taking differently: Take 20 mg by mouth daily with supper.  04/11/19   Mack Hook, MD   Allergies  Allergen Reactions  . Atorvastatin Rash  . Metformin And Related Nausea And Vomiting    Social History   Tobacco Use  . Smoking status: Current Some Day Smoker    Packs/day: 0.10    Years: 6.00    Pack years: 0.60    Types: Cigarettes    Start date: 12/17/1976  . Smokeless tobacco: Never Used  . Tobacco comment: 1-2 cigs daily 10/19  Substance Use Topics  . Alcohol use: No    Alcohol/week: 0.0 standard drinks    Family History  Problem Relation Age of Onset  . Heart disease Mother 67       Hx of MI   . Hypertension Mother   . Diabetes Mother   . Diabetes Sister   . Graves' disease Sister   . Lupus Father   . Graves' disease Brother   . Diabetes Brother   . Arthritis Daughter        Not clear if RA  . Hypertension Daughter   . Migraines Daughter   . Epilepsy Son        Started 8 months of age after bacterial meningitis  . GER disease Sister      Review of Systems  Positive ROS: neg  All other systems have been reviewed and were otherwise  negative with the exception of those mentioned in the HPI and as above.  Objective: Vital signs in last 24 hours: Temp:  [96.9 F (36.1 C)] 96.9 F (36.1 C) (11/16 0612) Pulse Rate:  [87] 87 (11/16 0613) BP: (154-165)/(98-102) 165/98 (11/16 0622) SpO2:  [100 %] 100 % (11/16 4008) Weight:  [52.3 kg] 52.3 kg (11/16 0612)  General Appearance: Alert, cooperative, no distress, appears stated age Head: Normocephalic, without obvious abnormality, atraumatic Eyes: PERRL, conjunctiva/corneas clear, EOM's intact    Neck: Supple, symmetrical, trachea midline Back: Symmetric, no curvature, ROM normal, no CVA tenderness Lungs:  respirations unlabored Heart: Regular rate and rhythm Abdomen: Soft, non-tender Extremities: Extremities normal, atraumatic, no cyanosis or edema Pulses: 2+ and symmetric  all extremities Skin: Skin color, texture, turgor normal, no rashes or lesions  NEUROLOGIC:   Mental status: Alert and oriented x4,  no aphasia, good attention span, fund of knowledge, and memory Motor Exam - grossly normal Sensory Exam - grossly normal Reflexes: 1+ Coordination - grossly normal Gait - grossly normal Balance - grossly normal Cranial Nerves: I: smell Not tested  II: visual acuity  OS: nl    OD: nl  II: visual fields Full to confrontation  II: pupils Equal, round, reactive to light  III,VII: ptosis None  III,IV,VI: extraocular muscles  Full ROM  V: mastication Normal  V: facial light touch sensation  Normal  V,VII: corneal reflex  Present  VII: facial muscle function - upper  Normal  VII: facial muscle function - lower Normal  VIII: hearing Not tested  IX: soft palate elevation  Normal  IX,X: gag reflex Present  XI: trapezius strength  5/5  XI: sternocleidomastoid strength 5/5  XI: neck flexion strength  5/5  XII: tongue strength  Normal    Data Review Lab Results  Component Value Date   WBC 6.6 06/28/2019   HGB 12.9 06/28/2019   HCT 39.5 06/28/2019   MCV 92  06/28/2019   PLT 267 06/28/2019   Lab Results  Component Value Date   NA 144 06/28/2019   K 4.5 06/28/2019   CL 107 (H) 06/28/2019   CO2 22 06/28/2019   BUN 18 06/28/2019   CREATININE 0.68 06/28/2019   GLUCOSE 118 (H) 06/28/2019   Lab Results  Component Value Date   INR 1.1 06/27/2019    Assessment/Plan:  Estimated body mass index is 19.8 kg/m as calculated from the following:   Height as of this encounter: '5\' 4"'  (1.626 m).   Weight as of this encounter: 52.3 kg. Patient admitted for SCS. Patient has failed a reasonable attempt at conservative therapy.  I explained the condition and procedure to the patient and answered any questions.  Patient wishes to proceed with procedure as planned. Understands risks/ benefits and typical outcomes of procedure.   Eustace Moore 07/01/2019 7:32 AM

## 2019-07-01 NOTE — Anesthesia Postprocedure Evaluation (Signed)
Anesthesia Post Note  Patient: Roberta Clark  Procedure(s) Performed: Spinal cord stimulator via Thoracic Nine laminectomy (N/A Back)     Patient location during evaluation: PACU Anesthesia Type: General Level of consciousness: sedated and patient cooperative Pain management: pain level controlled Vital Signs Assessment: post-procedure vital signs reviewed and stable Respiratory status: spontaneous breathing Cardiovascular status: stable Anesthetic complications: no    Last Vitals:  Vitals:   07/01/19 0950 07/01/19 1023  BP: 136/80 (!) 139/94  Pulse: 77 82  Resp: 12 20  Temp: 36.7 C 36.7 C  SpO2: 99% 99%    Last Pain:  Vitals:   07/01/19 1140  TempSrc:   PainSc: [place]

## 2019-07-01 NOTE — Anesthesia Procedure Notes (Signed)
Procedure Name: Intubation Date/Time: 07/01/2019 7:44 AM Performed by: Orlie Dakin, CRNA Pre-anesthesia Checklist: Patient identified, Emergency Drugs available, Suction available and Patient being monitored Patient Re-evaluated:Patient Re-evaluated prior to induction Oxygen Delivery Method: Circle system utilized Preoxygenation: Pre-oxygenation with 100% oxygen Induction Type: IV induction Ventilation: Mask ventilation without difficulty Laryngoscope Size: Miller and 3 Grade View: Grade I Tube type: Oral Tube size: 7.0 mm Number of attempts: 1 Airway Equipment and Method: Stylet Placement Confirmation: ETT inserted through vocal cords under direct vision,  positive ETCO2 and breath sounds checked- equal and bilateral Secured at: 22 cm Tube secured with: Tape Dental Injury: Teeth and Oropharynx as per pre-operative assessment  Comments: 4x4s bite block used.

## 2019-07-01 NOTE — Op Note (Signed)
07/01/2019  9:01 AM PATIENT:  Roberta Clark  58 y.o. female  PRE-OPERATIVE DIAGNOSIS: Postlaminectomy syndrome  POST-OPERATIVE DIAGNOSIS:  same  PROCEDURE: Insertion of permanent spinal cord stimulator via T9 laminectomy with placement of pulse generator  SURGEON:  Sherley Bounds, MD  ASSISTANTS: Glenford Peers, FNP  ANESTHESIA:   General  EBL: Less than 10 ml  No intake/output data recorded.  BLOOD ADMINISTERED: none  DRAINS: None  SPECIMEN:  none  INDICATION FOR PROCEDURE: This patient presented with postlaminectomy syndrome with back and leg pain. Imaging showed surgery at L4-5 with no recurrent disc herniation.  He did extremely well with a spinal cord stimulator trial the patient tried conservative measures without relief. Pain was debilitating. Recommended permanent spinal cord stimulator placement. Patient understood the risks, benefits, and alternatives and potential outcomes and wished to proceed.  PROCEDURE DETAILS: The patient was taken to the operating room and after induction of adequate generalized tracheal anesthesia the patient was rolled onto the [place] frame in the prone position. All pressure points were padded. The thoracic and lumbar region was cleaned and then prepped in the usual sterile fashion utilizing Betadine scrub and DuraPrep. Local anesthesia was injected and a dorsal midline incision was made over the T9-10 region and carried down to the thoracic fascia. The fascia was opened and the paraspinous musculature was taken down in a subcutaneous periosteal fashion to expose T9-10. Intraoperative fluoroscopy confirmed my level and then the spinous process was removed and a laminectomy was performed at T9 to expose the underlying dura. I then was able to pass our trial easily through the epidural space to the level of T7-8. This was done and checked with AP fluoroscopy. I then was able to pass my paddle lead to the same level utilizing AP fluoroscopy. The leads  were then tacked down to fascia. An incision was made in the right flank and a pocket was created for the battery. I then used a passer to pass the leads to the battery pocket. The leads were then placed into the battery and impedance was checked after placing the battery partially in the pocket. The leads were then tightened into position. The battery was then placed in the pocket with the leads behind the battery. We checked our final placement with AP fluoroscopy. I was then able to irrigate with copious amounts of bacitracin containing saline solution. I then closed the fascia of both incisions with 0 Vicryl. The subcutaneous tissues were closed with 2-0 Vicryl in the subcuticular tissue was closed with 3-0 Vicryl. The skin was closed with Dermabond and then benzoin and Steri-Strips. A sterile dressing was then applied. The patient was then awakened from general anesthesia and transported to the recovery room in stable condition. At the end of the procedure all sponge needle and instrument counts were correct.    PLAN OF CARE: Admit for overnight observation  PATIENT DISPOSITION:  PACU - hemodynamically stable.   Delay start of Pharmacological VTE agent (>24hrs) due to surgical blood loss or risk of bleeding:  yes

## 2019-07-01 NOTE — Transfer of Care (Signed)
Immediate Anesthesia Transfer of Care Note  Patient: Roberta Clark  Procedure(s) Performed: Spinal cord stimulator via Thoracic Nine laminectomy (N/A Back)  Patient Location: PACU  Anesthesia Type:General  Level of Consciousness: awake and patient cooperative  Airway & Oxygen Therapy: Patient Spontanous Breathing and Patient connected to nasal cannula oxygen  Post-op Assessment: Report given to RN and Post -op Vital signs reviewed and stable  Post vital signs: Reviewed and stable  Last Vitals:  Vitals Value Taken Time  BP    Temp    Pulse 79 07/01/19 0905  Resp 15 07/01/19 0905  SpO2 97 % 07/01/19 0905  Vitals shown include unvalidated device data.  Last Pain:  Vitals:   07/01/19 0612  PainSc: 8       Patients Stated Pain Goal: 2 ([id number])  Complications: No apparent anesthesia complications

## 2019-07-02 DIAGNOSIS — M961 Postlaminectomy syndrome, not elsewhere classified: Secondary | ICD-10-CM | POA: Diagnosis not present

## 2019-07-02 LAB — GLUCOSE, CAPILLARY: Glucose-Capillary: 101 mg/dL — ABNORMAL HIGH (ref 70–99)

## 2019-07-02 MED ORDER — HYDROCODONE-ACETAMINOPHEN 5-325 MG PO TABS: 1.0000 | ORAL_TABLET | ORAL | 0 refills | Status: DC | PRN

## 2019-07-02 NOTE — Discharge Summary (Signed)
Physician Discharge Summary  Patient ID: Roberta Clark MRN: [id number] DOB/AGE: August 19, 1960 58 y.o.  Admit date: 07/01/2019 Discharge date: 07/02/2019  Admission Diagnoses: failed back syndrome    Discharge Diagnoses: same   Discharged Condition: good  Hospital Course: The patient was admitted on 07/01/2019 and taken to the operating room where the patient underwent SCS. The patient tolerated the procedure well and was taken to the recovery room and then to the floor in stable condition. The hospital course was routine. There were no complications. The wound remained clean dry and intact. Pt had appropriate back soreness. No complaints of leg pain or new N/T/W. The patient remained afebrile with stable vital signs, and tolerated a regular diet. The patient continued to increase activities, and pain was well controlled with oral pain medications.   Consults: None  Significant Diagnostic Studies:  Results for orders placed or performed during the hospital encounter of 07/01/19  Glucose, capillary  Result Value Ref Range   Glucose-Capillary 124 (H) 70 - 99 mg/dL  Glucose, capillary  Result Value Ref Range   Glucose-Capillary 116 (H) 70 - 99 mg/dL   Comment 1 Notify RN    Comment 2 Document in Chart   Glucose, capillary  Result Value Ref Range   Glucose-Capillary 173 (H) 70 - 99 mg/dL  Glucose, capillary  Result Value Ref Range   Glucose-Capillary 125 (H) 70 - 99 mg/dL  Glucose, capillary  Result Value Ref Range   Glucose-Capillary 140 (H) 70 - 99 mg/dL   Comment 1 Notify RN    Comment 2 Document in Chart   Glucose, capillary  Result Value Ref Range   Glucose-Capillary 101 (H) 70 - 99 mg/dL   Comment 1 Notify RN    Comment 2 Document in Chart     Chest 2 View  Result Date: 06/27/2019 CLINICAL DATA:  Post-laminectomy syndrome. Pre-op respiratory exam for spinal stimulator placement. EXAM: CHEST - 2 VIEW COMPARISON:  05/24/2016 FINDINGS: The heart size and mediastinal  contours are within normal limits. Both lungs are clear. Old fracture deformity of left posterior 9th rib again seen. IMPRESSION: Stable exam.  No active cardiopulmonary disease. Electronically Signed   By: Marlaine Hind M.D.   On: 06/27/2019 15:10   Dg Thoracic Spine 1 View  Result Date: 07/01/2019 CLINICAL DATA:  Thoracic stimulator insertion. EXAM: DG C-ARM 1-60 MIN; THORACIC SPINE - 1 VIEW CONTRAST:  None. FLUOROSCOPY TIME:  Fluoroscopy Time:  16 seconds. Number of Acquired Spot Images: 1. COMPARISON:  None. FINDINGS: Single intraoperative fluoroscopic image demonstrates stimulator leads in the expected position of the midthoracic spine. IMPRESSION: Fluoroscopic guidance provided during thoracic stimulator placement. Electronically Signed   By: Marijo Conception M.D.   On: 07/01/2019 08:53   Dg C-arm 1-60 Min  Result Date: 07/01/2019 CLINICAL DATA:  Thoracic stimulator insertion. EXAM: DG C-ARM 1-60 MIN; THORACIC SPINE - 1 VIEW CONTRAST:  None. FLUOROSCOPY TIME:  Fluoroscopy Time:  16 seconds. Number of Acquired Spot Images: 1. COMPARISON:  None. FINDINGS: Single intraoperative fluoroscopic image demonstrates stimulator leads in the expected position of the midthoracic spine. IMPRESSION: Fluoroscopic guidance provided during thoracic stimulator placement. Electronically Signed   By: Marijo Conception M.D.   On: 07/01/2019 08:53    Antibiotics:  Anti-infectives (From admission, onward)   Start     Dose/Rate Route Frequency Ordered Stop   07/01/19 1600  ceFAZolin (ANCEF) IVPB 2g/100 mL premix     2 g 200 mL/hr over 30 Minutes Intravenous Every 8  hours 07/01/19 1025 07/02/19 0046   07/01/19 0821  vancomycin (VANCOCIN) powder  Status:  Discontinued       As needed 07/01/19 0821 07/01/19 0859   07/01/19 0819  bacitracin 50,000 Units in sodium chloride 0.9 % 500 mL irrigation  Status:  Discontinued       As needed 07/01/19 0819 07/01/19 0859   07/01/19 0615  ceFAZolin (ANCEF) IVPB 2g/100 mL premix      2 g 200 mL/hr over 30 Minutes Intravenous On call to O.R. 07/01/19 0607 07/01/19 0805      Discharge Exam: Blood pressure 112/69, pulse 75, temperature 98 F (36.7 C), temperature source Oral, resp. rate 18, height _0  (1.626 m), weight 52.3 kg, SpO2 95 %. Neurologic: Grossly normal Dressings dry  Discharge Medications:   Allergies as of 07/02/2019      Reactions   Atorvastatin Rash   Metformin And Related Nausea And Vomiting      Medication List    TAKE these medications   Aleve PM 220-25 MG Tabs Generic drug: Naproxen Sod-diphenhydrAMINE Take 2 tablets by mouth at bedtime.   amitriptyline 25 MG tablet Commonly known as: ELAVIL Take 1 tablet (25 mg total) by mouth at bedtime.   Fish Oil 1000 MG Caps Take 1,000 mg by mouth 2 (two) times daily.   FLUoxetine 40 MG capsule Commonly known as: PROZAC TAKE ONE CAPSULE BY MOUTH EVERY DAY   gabapentin 600 MG tablet Commonly known as: NEURONTIN Take 1-2 tablets (600-1,200 mg total) by mouth See admin instructions. TAKE ONE TABLET BY MOUTH EVERY MORNING AND AT MIDDAY,AND THEN TAKE TWO TABLETS BY MOUTH AT BEDTIME What changed:   how much to take  when to take this   glipiZIDE 5 MG tablet Commonly known as: GLUCOTROL TAKE ONE-HALF TABLET BY MOUTH TWICE DAILY WITH MEALS What changed:   how much to take  how to take this  when to take this  additional instructions   glucose blood test strip Commonly known as: OneTouch Verio Check Blood glucose twice daily before meals   HYDROcodone-acetaminophen 5-325 MG tablet Commonly known as: NORCO/VICODIN Take 1 tablet by mouth every 4 (four) hours as needed for moderate pain.   IBU 800 MG tablet Generic drug: ibuprofen TAKE ONE TABLET BY MOUTH TWICE DAILY What changed: how much to take   iron polysaccharides 150 MG capsule Commonly known as: NIFEREX Take 1 capsule (150 mg total) by mouth daily.   lisinopril-hydrochlorothiazide 20-12.5 MG tablet Commonly known  as: ZESTORETIC TAKE ONE TABLET BY MOUTH ONCE DAILY   Melatonin 3 MG Subl Place 6 mg under the tongue at bedtime.   meloxicam 7.5 MG tablet Commonly known as: MOBIC 2 tabs by mouth once daily What changed:   how much to take  how to take this  when to take this  additional instructions   methocarbamol 500 MG tablet Commonly known as: ROBAXIN Take 500 mg by mouth every 8 (eight) hours as needed for muscle spasms.   OneTouch Delica Lancets 40C Misc Check blood glucose twice a day before meals. For Dx E11.9   OneTouch Verio w/Device Kit Check blood glucose twice daily before meals   potassium chloride 10 MEQ tablet Commonly known as: KLOR-CON Take 1 tablet (10 mEq total) by mouth daily. What changed: See the new instructions.   rosuvastatin 20 MG tablet Commonly known as: CRESTOR TAKE ONE TABLET BY MOUTH EVERY DAY with evening meal What changed:   how much to take  how to  take this  when to take this  additional instructions   tiZANidine 2 MG tablet Commonly known as: ZANAFLEX Take 2 mg by mouth every 6 (six) hours as needed for muscle spasms.       Disposition: home   Final Dx: SCS  Discharge Instructions     Remove dressing in 72 hours   Complete by: As directed    Call MD for:  difficulty breathing, headache or visual disturbances   Complete by: As directed    Call MD for:  persistant nausea and vomiting   Complete by: As directed    Call MD for:  redness, tenderness, or signs of infection (pain, swelling, redness, odor or green/yellow discharge around incision site)   Complete by: As directed    Call MD for:  severe uncontrolled pain   Complete by: As directed    Call MD for:  temperature >100.4   Complete by: As directed    Diet - low sodium heart healthy   Complete by: As directed    Increase activity slowly   Complete by: As directed       Follow-up Information    Eustace Moore, MD. Schedule an appointment as soon as possible for a  visit in 2 week(s).   Specialty: Neurosurgery Contact information: [address] [place] [address] [place] [address] [phone]            Signed: Eustace Moore 07/02/2019, 7:55 AM

## 2019-07-02 NOTE — Discharge Instructions (Signed)
Wound Care  Keep the incision clean and dry remove the outer dressing in 2 days, leave the Steri-Strips intact.  Do not put any creams, lotions, or ointments on incision. Leave steri-strips on back.  They will fall off by themselves.  Activity Walk each and every day, increasing distance each day. No lifting greater than 5 lbs.  No lifting no bending no twisting no driving or riding a car unless coming back and forth to see me.  Diet Resume your normal diet.   Return to Work Will be discussed at you follow up appointment.  Call Your Doctor If Any of These Occur Redness, drainage, or swelling at the wound.  Temperature greater than 101 degrees. Severe pain not relieved by pain medication. Incision starts to come apart. Follow Up Appt Call today for appointment in 1-2 weeks ([phone]) or for problems.     

## 2019-07-02 NOTE — Progress Notes (Signed)
Patient is discharged from room 3C07 at this time. Alert and in stable condition. IV site d/c'd and instructions read to patient with understanding verbalized. Left unit via wheelchair with daughter and all belongings at side.

## 2019-07-03 ENCOUNTER — Encounter (HOSPITAL_COMMUNITY): Payer: Self-pay | Admitting: Neurological Surgery

## 2019-07-18 ENCOUNTER — Other Ambulatory Visit: Payer: Self-pay | Admitting: Internal Medicine

## 2019-07-19 ENCOUNTER — Telehealth: Payer: Self-pay | Admitting: *Deleted

## 2019-07-19 NOTE — Telephone Encounter (Signed)
Patient is trying to schedule appt. W/ Dr. Whitman Hero call

## 2019-08-06 ENCOUNTER — Ambulatory Visit (INDEPENDENT_AMBULATORY_CARE_PROVIDER_SITE_OTHER): Payer: Medicare Other | Admitting: Sports Medicine

## 2019-08-06 ENCOUNTER — Encounter: Payer: Self-pay | Admitting: Sports Medicine

## 2019-08-06 ENCOUNTER — Other Ambulatory Visit: Payer: Self-pay

## 2019-08-06 DIAGNOSIS — M79671 Pain in right foot: Secondary | ICD-10-CM | POA: Diagnosis not present

## 2019-08-06 DIAGNOSIS — E119 Type 2 diabetes mellitus without complications: Secondary | ICD-10-CM

## 2019-08-06 DIAGNOSIS — M779 Enthesopathy, unspecified: Secondary | ICD-10-CM

## 2019-08-06 DIAGNOSIS — M21619 Bunion of unspecified foot: Secondary | ICD-10-CM | POA: Diagnosis not present

## 2019-08-06 DIAGNOSIS — M79672 Pain in left foot: Secondary | ICD-10-CM

## 2019-08-06 NOTE — Patient Instructions (Signed)
Pre-Operative Instructions  Congratulations, you have decided to take an important step towards improving your quality of life.  You can be assured that the doctors and staff at [facility] will be with you every step of the way.  Here are some important things you should know:  1. Plan to be at the surgery center/hospital at least 1 (one) hour prior to your scheduled time, unless otherwise directed by the surgical center/hospital staff.  You must have a responsible adult accompany you, remain during the surgery and drive you home.  Make sure you have directions to the surgical center/hospital to ensure you arrive on time. 2. If you are having surgery at [facility] or [facility] hospitals, you will need a copy of your medical history and physical form from your family physician within one month prior to the date of surgery. We will give you a form for your primary physician to complete.  3. We make every effort to accommodate the date you request for surgery.  However, there are times where surgery dates or times have to be moved.  We will contact you as soon as possible if a change in schedule is required.   4. No aspirin/ibuprofen for one week before surgery.  If you are on aspirin, any non-steroidal anti-inflammatory medications (Mobic, Aleve, Ibuprofen) should not be taken seven (7) days prior to your surgery.  You make take Tylenol for pain prior to surgery.  5. Medications - If you are taking daily heart and blood pressure medications, seizure, reflux, allergy, asthma, anxiety, pain or diabetes medications, make sure you notify the surgery center/hospital before the day of surgery so they can tell you which medications you should take or avoid the day of surgery. 6. No food or drink after midnight the night before surgery unless directed otherwise by surgical center/hospital staff. 7. No alcoholic beverages 24-hours prior to surgery.  No smoking 24-hours prior or 24-hours after  surgery. 8. Wear loose pants or shorts. They should be loose enough to fit over bandages, boots, and casts. 9. Don't wear slip-on shoes. Sneakers are preferred. 10. Bring your boot with you to the surgery center/hospital.  Also bring crutches or a walker if your physician has prescribed it for you.  If you do not have this equipment, it will be provided for you after surgery. 11. If you have not been contacted by the surgery center/hospital by the day before your surgery, call to confirm the date and time of your surgery. 12. Leave-time from work may vary depending on the type of surgery you have.  Appropriate arrangements should be made prior to surgery with your employer. 13. Prescriptions will be provided immediately following surgery by your doctor.  Fill these as soon as possible after surgery and take the medication as directed. Pain medications will not be refilled on weekends and must be approved by the doctor. 14. Remove nail polish on the operative foot and avoid getting pedicures prior to surgery. 15. Wash the night before surgery.  The night before surgery wash the foot and leg well with water and the antibacterial soap provided. Be sure to pay special attention to beneath the toenails and in between the toes.  Wash for at least three (3) minutes. Rinse thoroughly with water and dry well with a towel.  Perform this wash unless told not to do so by your physician.  Enclosed: 1 Ice pack (please put in freezer the night before surgery)   1 Hibiclens skin cleaner     Pre-op instructions  If you have any questions regarding the instructions, please do not hesitate to call our office.  [place]: [address], [place], [place] [address] -- [phone]  [place]: [address] [place], [place] [address] -- [phone]  [place]: [address], [place], [place] [address] -- [phone]   Website: [email or web address] 

## 2019-08-06 NOTE — Progress Notes (Signed)
Subjective: Roberta Clark is a 58 y.o. female patient who returns to office for follow up evaluation of bilateral foot pain. Patient reports that her bunions are sore again but the left one hurts worse reports that the previous injection helped on the right side but wants to discuss the possibility of surgery.  Patient denies any changes with medical history except being on hydrocodone for her back.   Diabetic type II FBS under 120, last A1c 6.6.  Patient Active Problem List   Diagnosis Date Noted  . S/P insertion of spinal cord stimulator 07/01/2019  . Low back pain with left-sided sciatica   . Long term use of drug 01/24/2019  . Foot pain, bilateral 12/04/2018  . Trochanteric bursitis of left hip 08/29/2018  . Avascular necrosis of bone of hip ([place]) 08/24/2017  . Avascular necrosis of femoral head, right ([place]) 02/22/2017  . Avascular necrosis of femoral head, left ([place]) 10/24/2016  . OA (osteoarthritis) of hip 10/24/2016  . S/P lumbar laminectomy 05/27/2016  . Type 2 diabetes mellitus without complication, without long-term current use of insulin ([place]) 11/03/2015  . Multilevel degenerative disc disease 10/22/2014  . Chronic back pain 05/27/2014  . Lumbar radiculopathy, chronic 06/14/2013  . Adenomatous colon polyp 05/13/2013  . Vaginal discharge 08/22/2012  . Hypertension 07/25/2012  . Preventative health care 07/25/2012  . Hot flash, menopausal 07/25/2012  . Chronic low back pain 07/25/2012  . Fibroids 09/02/2011  . Tobacco dependence 09/02/2011  . Hyperlipidemia 08/15/2009    Current Outpatient Medications on File Prior to Visit  Medication Sig Dispense Refill  . amitriptyline (ELAVIL) 25 MG tablet TAKE ONE TABLET BY MOUTH AT BEDTIME AS NEEDED SLEEP 30 tablet 11  . Blood Glucose Monitoring Suppl (ONETOUCH VERIO) w/Device KIT Check blood glucose twice daily before meals 1 kit 0  . FLUoxetine (PROZAC) 40 MG capsule TAKE ONE CAPSULE BY MOUTH EVERY DAY (Patient taking  differently: Take 40 mg by mouth daily. ) 30 capsule 5  . gabapentin (NEURONTIN) 600 MG tablet Take 1-2 tablets (600-1,200 mg total) by mouth See admin instructions. TAKE ONE TABLET BY MOUTH EVERY MORNING AND AT MIDDAY,AND THEN TAKE TWO TABLETS BY MOUTH AT BEDTIME (Patient taking differently: Take 1,200 mg by mouth 3 (three) times daily. TAKE ONE TABLET BY MOUTH EVERY MORNING AND AT MIDDAY,AND THEN TAKE TWO TABLETS BY MOUTH AT BEDTIME) 120 tablet 11  . glipiZIDE (GLUCOTROL) 5 MG tablet TAKE ONE-HALF TABLET BY MOUTH TWICE DAILY WITH MEALS (Patient taking differently: Take 2.5 mg by mouth 2 (two) times daily before a meal. ) 30 tablet 11  . glucose blood (ONETOUCH VERIO) test strip Check Blood glucose twice daily before meals 100 each 12  . HYDROcodone-acetaminophen (NORCO/VICODIN) 5-325 MG tablet Take 1 tablet by mouth every 4 (four) hours as needed for moderate pain. 30 tablet 0  . IBU 800 MG tablet TAKE ONE TABLET BY MOUTH TWICE DAILY (Patient taking differently: Take 800 mg by mouth 2 (two) times daily. ) 60 tablet 4  . iron polysaccharides (NIFEREX) 150 MG capsule Take 1 capsule (150 mg total) by mouth daily. 21 capsule 0  . lisinopril-hydrochlorothiazide (ZESTORETIC) 20-12.5 MG tablet TAKE ONE TABLET BY MOUTH ONCE DAILY (Patient taking differently: Take 1 tablet by mouth daily. ) 30 tablet 8  . Melatonin 3 MG SUBL Place 6 mg under the tongue at bedtime.    . meloxicam (MOBIC) 7.5 MG tablet 2 tabs by mouth once daily (Patient taking differently: Take 15 mg by mouth daily. 2  tabs by mouth once dail) 30 tablet 0  . methocarbamol (ROBAXIN) 500 MG tablet Take 500 mg by mouth every 8 (eight) hours as needed for muscle spasms.   0  . Naproxen Sod-diphenhydrAMINE (ALEVE PM) 220-25 MG TABS Take 2 tablets by mouth at bedtime.    . Omega-3 Fatty Acids (FISH OIL) 1000 MG CAPS Take 1,000 mg by mouth 2 (two) times daily.     Glory Rosebush Delica Lancets 94W MISC Check blood glucose twice a day before meals. For Dx  E11.9 100 each 12  . potassium chloride (K-DUR) 10 MEQ tablet Take 1 tablet (10 mEq total) by mouth daily. (Patient taking differently: Take 10 mEq by mouth daily. ) 30 tablet 9  . rosuvastatin (CRESTOR) 20 MG tablet TAKE ONE TABLET BY MOUTH EVERY DAY with evening meal (Patient taking differently: Take 20 mg by mouth daily with supper. ) 30 tablet 11  . tiZANidine (ZANAFLEX) 2 MG tablet Take 2 mg by mouth every 6 (six) hours as needed for muscle spasms.      No current facility-administered medications on file prior to visit.    Allergies  Allergen Reactions  . Atorvastatin Rash  . Metformin And Related Nausea And Vomiting   Past Surgical History:  Procedure Laterality Date  . BACK SURGERY    . COLONOSCOPY    . LUMBAR LAMINECTOMY/DECOMPRESSION MICRODISCECTOMY Bilateral 05/27/2016   Procedure: Left Lumbar Four-Five Laminectomy and Foraminotomy;  Surgeon: Eustace Moore, MD;  Location: [facility];  Service: Neurosurgery;  Laterality: Bilateral;  . SPINAL CORD STIMULATOR INSERTION N/A 07/01/2019   Procedure: Spinal cord stimulator via Thoracic Nine laminectomy;  Surgeon: Eustace Moore, MD;  Location: [facility];  Service: Neurosurgery;  Laterality: N/A;  Spinal cord stimulator via Thoracic Nine laminectomy  . TOTAL HIP ARTHROPLASTY Left 10/24/2016   Procedure: LEFT TOTAL HIP ARTHROPLASTY ANTERIOR APPROACH;  Surgeon: Gaynelle Arabian, MD;  Location: [facility];  Service: Orthopedics;  Laterality: Left;  . TOTAL HIP ARTHROPLASTY Right 02/22/2017   Procedure: RIGHT TOTAL HIP ARTHROPLASTY ANTERIOR APPROACH;  Surgeon: Gaynelle Arabian, MD;  Location: [facility];  Service: Orthopedics;  Laterality: Right;  . TUBAL LIGATION  1985    Family History  Problem Relation Age of Onset  . Heart disease Mother 62       Hx of MI   . Hypertension Mother   . Diabetes Mother   . Diabetes Sister   . Graves' disease Sister   . Lupus Father   . Graves' disease Brother   . Diabetes Brother   . Arthritis Daughter        Not  clear if RA  . Hypertension Daughter   . Migraines Daughter   . Epilepsy Son        Started 21 months of age after bacterial meningitis  . GER disease Sister     Social History   Socioeconomic History  . Marital status: Single    Spouse name: Not on file  . Number of children: 2  . Years of education: 51  . Highest education level: Associate degree: occupational, Hotel manager, or vocational program  Occupational History  . 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 Smoking status: Current Some Day Smoker    Packs/day: 0.10    Years: 6.00    Pack years: 0.60    Types: Cigarettes    Start date: 12/17/1976  . Smokeless tobacco: Never Used  . Tobacco comment: 1-2 cigs daily 10/19  Substance and Sexual Activity  . Alcohol use: No    Alcohol/week: 0.0 standard drinks  . Drug use: No  . Sexual activity: Yes    Birth control/protection: Post-menopausal  Other Topics Concern  . Not on file  Social History Narrative   Lives alone   Has a significant other--[name], who is also a patient.   Children live in town and are supportive.   Originally from [place].   Went to [place] [place].   Social Determinants of Health   Financial Resource Strain: Low Risk   . Difficulty of Paying Living Expenses: Not very hard  Food Insecurity: No Food Insecurity  . Worried About Charity fundraiser in the Last Year: Never true  . Ran Out of Food in the Last Year: Never true  Transportation Needs: No Transportation Needs  . Lack of Transportation (Medical): No  . Lack of Transportation (Non-Medical): No  Physical Activity:   . Days of Exercise per Week: Not on file  . Minutes of Exercise per Session: Not on file  Stress:   . Feeling of Stress : Not on file  Social Connections: Unknown  . Frequency of Communication with Friends and Family: Not on file  . Frequency of Social Gatherings with Friends and Family: Not on file  . Attends Religious  Services: Not on file  . Active Member of Clubs or Organizations: Not on file  . Attends Archivist Meetings: Not on file  . Marital Status: Never married     Objective:  General: Alert and oriented x3 in no acute distress  Dermatology: No open lesions bilateral lower extremities, no webspace macerations, no ecchymosis bilateral, all nails x 10 are well manicured with mild distal thickening otherwise no acute findings.  Vascular: Dorsalis Pedis and Posterior Tibial pedal pulses palpable, Capillary Fill Time 3 seconds,(+) pedal hair growth bilateral, no edema bilateral lower extremities, Temperature gradient within normal limits.  Neurology: Johney Maine sensation intact via light touch bilateral, protective sensation intact bilateral.  Musculoskeletal: Mild tenderness with palpation at bunions L>R foot,+ Pes planus. Strength within normal limits in all groups bilateral.   Assessment and Plan: Problem List Items Addressed This Visit      Endocrine   Type 2 diabetes mellitus without complication, without long-term current use of insulin ([place])    Other Visit Diagnoses    Bunion    -  Primary   Capsulitis       Bilateral foot pain         -Complete examination performed -Previous xrays reviewed -Discussed treatement options for capsulitis at bunions bilateral  -Patient opt for surgical management. Consent obtained for bunionectomy left.  Pre and Post op course explained. Risks, benefits, alternatives explained. No guarantees given or implied. Surgical booking slip submitted and provided patient with Surgical packet and info for [facility]. -To dispense cam walker and crutches at surgical center -Patient advised that she will have to be out of work maximum of 12 weeks for postsurgical recovery in the first month will consist of nonweightbearing -Patient to return to office after surgery or sooner if problems or issues arise.  Landis Martins, DPM

## 2019-10-15 ENCOUNTER — Other Ambulatory Visit (INDEPENDENT_AMBULATORY_CARE_PROVIDER_SITE_OTHER): Payer: Medicare Other

## 2019-10-15 ENCOUNTER — Other Ambulatory Visit: Payer: Self-pay

## 2019-10-15 DIAGNOSIS — R8290 Unspecified abnormal findings in urine: Secondary | ICD-10-CM

## 2019-10-15 LAB — POCT URINALYSIS DIPSTICK
Bilirubin, UA: NEGATIVE
Blood, UA: NEGATIVE
Glucose, UA: NEGATIVE
Ketones, UA: NEGATIVE
Nitrite, UA: NEGATIVE
Protein, UA: POSITIVE — AB
Spec Grav, UA: 1.02 (ref 1.010–1.025)
Urobilinogen, UA: 0.2 E.U./dL
pH, UA: 6 (ref 5.0–8.0)

## 2019-10-17 LAB — URINE CULTURE

## 2019-10-23 ENCOUNTER — Other Ambulatory Visit: Payer: Self-pay | Admitting: Internal Medicine

## 2019-10-23 DIAGNOSIS — F419 Anxiety disorder, unspecified: Secondary | ICD-10-CM

## 2019-10-23 DIAGNOSIS — F329 Major depressive disorder, single episode, unspecified: Secondary | ICD-10-CM

## 2019-10-23 DIAGNOSIS — F32A Depression, unspecified: Secondary | ICD-10-CM

## 2019-11-04 ENCOUNTER — Ambulatory Visit
Admission: RE | Admit: 2019-11-04 | Discharge: 2019-11-04 | Disposition: A | Discharge status: Home / Self Care | Payer: Medicare Other | Source: Ambulatory Visit | Attending: Internal Medicine | Admitting: Internal Medicine

## 2019-11-04 ENCOUNTER — Other Ambulatory Visit: Payer: Self-pay

## 2019-11-04 ENCOUNTER — Other Ambulatory Visit: Payer: Self-pay | Admitting: Internal Medicine

## 2019-11-04 ENCOUNTER — Ambulatory Visit: Payer: Medicare Other

## 2019-11-04 DIAGNOSIS — Z1231 Encounter for screening mammogram for malignant neoplasm of breast: Secondary | ICD-10-CM

## 2019-11-04 DIAGNOSIS — Z780 Asymptomatic menopausal state: Secondary | ICD-10-CM

## 2019-11-09 ENCOUNTER — Other Ambulatory Visit: Payer: Self-pay | Admitting: Internal Medicine

## 2019-11-09 MED ORDER — NITROFURANTOIN MONOHYD MACRO 100 MG PO CAPS: ORAL_CAPSULE | ORAL | 0 refills | Status: DC

## 2019-11-15 ENCOUNTER — Other Ambulatory Visit: Payer: Self-pay | Admitting: Internal Medicine

## 2019-11-18 ENCOUNTER — Other Ambulatory Visit (INDEPENDENT_AMBULATORY_CARE_PROVIDER_SITE_OTHER): Payer: Medicare HMO

## 2019-11-18 ENCOUNTER — Other Ambulatory Visit: Payer: Self-pay

## 2019-11-18 DIAGNOSIS — N3000 Acute cystitis without hematuria: Secondary | ICD-10-CM | POA: Diagnosis not present

## 2019-11-18 LAB — POCT URINALYSIS DIPSTICK
Bilirubin, UA: NEGATIVE
Blood, UA: NEGATIVE
Glucose, UA: NEGATIVE
Ketones, UA: NEGATIVE
Nitrite, UA: NEGATIVE
Protein, UA: NEGATIVE
Spec Grav, UA: 1.01 (ref 1.010–1.025)
Urobilinogen, UA: 0.2 E.U./dL
pH, UA: 6.5 (ref 5.0–8.0)

## 2019-11-20 LAB — URINE CULTURE: Organism ID, Bacteria: NO GROWTH

## 2019-12-11 ENCOUNTER — Encounter: Payer: Self-pay | Admitting: Internal Medicine

## 2019-12-11 ENCOUNTER — Ambulatory Visit (INDEPENDENT_AMBULATORY_CARE_PROVIDER_SITE_OTHER): Payer: Medicare HMO | Admitting: Internal Medicine

## 2019-12-11 ENCOUNTER — Other Ambulatory Visit: Payer: Self-pay

## 2019-12-11 VITALS — BP 130/80 | HR 74 | Resp 12 | Ht 62.0 in | Wt 116.0 lb

## 2019-12-11 DIAGNOSIS — E119 Type 2 diabetes mellitus without complications: Secondary | ICD-10-CM

## 2019-12-11 DIAGNOSIS — E782 Mixed hyperlipidemia: Secondary | ICD-10-CM | POA: Diagnosis not present

## 2019-12-11 DIAGNOSIS — N898 Other specified noninflammatory disorders of vagina: Secondary | ICD-10-CM

## 2019-12-11 DIAGNOSIS — I10 Essential (primary) hypertension: Secondary | ICD-10-CM | POA: Diagnosis not present

## 2019-12-11 DIAGNOSIS — F329 Major depressive disorder, single episode, unspecified: Secondary | ICD-10-CM

## 2019-12-11 DIAGNOSIS — F32A Depression, unspecified: Secondary | ICD-10-CM

## 2019-12-11 LAB — POCT WET PREP WITH KOH
KOH Prep POC: NEGATIVE
RBC Wet Prep HPF POC: NEGATIVE
Trichomonas, UA: NEGATIVE
Yeast Wet Prep HPF POC: NEGATIVE

## 2019-12-11 MED ORDER — METRONIDAZOLE 500 MG PO TABS: ORAL_TABLET | ORAL | 0 refills | Status: DC

## 2019-12-11 NOTE — Progress Notes (Signed)
Subjective:    Patient ID: Roberta Clark, female   DOB: May 21, 1961, 59 y.o.   MRN: [id number]   HPI   Lost her brother to [place] in March.  She is doing okay with the grieving process.  She was vaccinated here.    1.  DM:  Sugars running well.  Generally running highest 125.   Last A1C in November 6.6% Has eye check in June with Dr. Warden Fillers.  2.  Hypertension:  Controlled.    3.  Depression:  Controlled.  Sleeping okay.  4.  Chronic low back pain:  Her implanted spinal cord stimulator has been wonderful for her.    5.  Tobacco use:  She has put this off until this May when her grandson walks across the stage to get his diploma.    6.  Vaginal discharge:  Not bothersome.  No odor.  Brown coloration.  No sex for months.  No pelvic pain.  Has not had a period in more than 10 years.  Does not want a GYN exam today.          Current Meds  Medication Sig  . amitriptyline (ELAVIL) 25 MG tablet TAKE ONE TABLET BY MOUTH AT BEDTIME AS NEEDED SLEEP  . Blood Glucose Monitoring Suppl (ONETOUCH VERIO) w/Device KIT Check blood glucose twice daily before meals  . FLUoxetine (PROZAC) 40 MG capsule TAKE ONE CAPSULE BY MOUTH EVERY DAY  . gabapentin (NEURONTIN) 600 MG tablet TAKE ONE TABLET BY MOUTH EVERY MORNING AND AT MIDDAY,AND THEN TAKE TWO TABLETS BY MOUTH AT BEDTIME]  . glipiZIDE (GLUCOTROL) 5 MG tablet TAKE ONE-HALF TABLET BY MOUTH TWICE DAILY WITH MEALS  . glucose blood (ONETOUCH VERIO) test strip Check Blood glucose twice daily before meals  . IBU 800 MG tablet TAKE ONE TABLET BY MOUTH TWICE DAILY (Patient taking differently: Take 800 mg by mouth 2 (two) times daily. )  . iron polysaccharides (NIFEREX) 150 MG capsule Take 1 capsule (150 mg total) by mouth daily.  Marland Kitchen lisinopril-hydrochlorothiazide (ZESTORETIC) 20-12.5 MG tablet TAKE ONE TABLET BY MOUTH ONCE DAILY (Patient taking differently: Take 1 tablet by mouth daily. )  . Melatonin 3 MG SUBL Place 6 mg under the tongue at  bedtime.  . meloxicam (MOBIC) 7.5 MG tablet 2 tabs by mouth once daily (Patient taking differently: Take 15 mg by mouth daily. 2 tabs by mouth once dail)  . methocarbamol (ROBAXIN) 500 MG tablet Take 500 mg by mouth every 8 (eight) hours as needed for muscle spasms.   . Naproxen Sod-diphenhydrAMINE (ALEVE PM) 220-25 MG TABS Take 2 tablets by mouth at bedtime.  . Omega-3 Fatty Acids (FISH OIL) 1000 MG CAPS Take 1,000 mg by mouth 2 (two) times daily.   Glory Rosebush Delica Lancets 38T MISC Check blood glucose twice a day before meals. For Dx E11.9  . potassium chloride (KLOR-CON) 10 MEQ tablet Take 1 tablet (10 mEq total) by mouth daily.  . rosuvastatin (CRESTOR) 20 MG tablet TAKE ONE TABLET BY MOUTH EVERY DAY with evening meal  . tiZANidine (ZANAFLEX) 2 MG tablet Take 2 mg by mouth every 6 (six) hours as needed for muscle spasms.    Allergies  Allergen Reactions  . Atorvastatin Rash  . Metformin And Related Nausea And Vomiting     Review of Systems    Objective:   BP 130/80 (BP Location: Left Arm, Patient Position: Sitting, Cuff Size: Normal)   Pulse 74   Resp 12   Ht 5'  2" (1.575 m)   Wt 116 lb (52.6 kg)   BMI 21.22 kg/m   Physical Exam  NAD HEENT:  PERRL, EOMI Neck:  Supple. No adenopathy Chest:  CTA CV:  RRR without murmur or rub.  Radial and DP pulses normal and equal Abd:  S, NT, No HSM or mass, + BS LS:  No edema   Assessment & Plan   1.  Grieving loss of brother.  2.  DM:  Controlled.  A1C with labs in June  3.  Hyperlipidemia:  LDL not at goal last check--recheck in June.  4.  Hypertension:  Controlled.  5.  Chronic back pain:  Implanted stimulator has made a huge difference for her.  6.  Vaginal discharge:  Treating for BV.  Metronidazole 500 mg twice daily for 7 days.  To call if continues with discharge.  GC/chlamydia as well.  7.  Depression:  Controlled  8.  Tobacco:  Has made a promise to stop when her oldest grandson has diploma in hand.

## 2019-12-13 LAB — GC/CHLAMYDIA PROBE AMP
Chlamydia trachomatis, NAA: NEGATIVE
Neisseria Gonorrhoeae by PCR: NEGATIVE

## 2020-01-24 ENCOUNTER — Other Ambulatory Visit: Payer: Self-pay

## 2020-01-24 ENCOUNTER — Other Ambulatory Visit (INDEPENDENT_AMBULATORY_CARE_PROVIDER_SITE_OTHER): Payer: Medicare Other

## 2020-01-24 DIAGNOSIS — E119 Type 2 diabetes mellitus without complications: Secondary | ICD-10-CM

## 2020-01-24 DIAGNOSIS — E782 Mixed hyperlipidemia: Secondary | ICD-10-CM

## 2020-01-24 DIAGNOSIS — Z79899 Other long term (current) drug therapy: Secondary | ICD-10-CM

## 2020-01-25 LAB — BASIC METABOLIC PANEL
BUN/Creatinine Ratio: 27 (ref 12–28)
BUN: 24 mg/dL (ref 8–27)
CO2: 22 mmol/L (ref 20–29)
Calcium: 9.3 mg/dL (ref 8.7–10.3)
Chloride: 101 mmol/L (ref 96–106)
Creatinine, Ser: 0.88 mg/dL (ref 0.57–1.00)
GFR calc Af Amer: 83 mL/min/{1.73_m2} (ref >59)
GFR calc non Af Amer: 72 mL/min/{1.73_m2} (ref >59)
Glucose: 98 mg/dL (ref 65–99)
Potassium: 4.7 mmol/L (ref 3.5–5.2)
Sodium: 143 mmol/L (ref 134–144)

## 2020-01-25 LAB — LIPID PANEL W/O CHOL/HDL RATIO
Cholesterol, Total: 145 mg/dL (ref 100–199)
HDL: 46 mg/dL (ref >39)
LDL Chol Calc (NIH): 84 mg/dL (ref 0–99)
Triglycerides: 74 mg/dL (ref 0–149)
VLDL Cholesterol Cal: 15 mg/dL (ref 5–40)

## 2020-01-25 LAB — HGB A1C W/O EAG: Hgb A1c MFr Bld: 6.5 % — ABNORMAL HIGH (ref 4.8–5.6)

## 2020-02-03 ENCOUNTER — Other Ambulatory Visit: Payer: Self-pay | Admitting: Internal Medicine

## 2020-02-20 ENCOUNTER — Other Ambulatory Visit: Payer: Self-pay | Admitting: Internal Medicine

## 2020-02-20 DIAGNOSIS — M5442 Lumbago with sciatica, left side: Secondary | ICD-10-CM

## 2020-03-02 ENCOUNTER — Other Ambulatory Visit: Payer: Self-pay | Admitting: Internal Medicine

## 2020-03-02 DIAGNOSIS — F32A Depression, unspecified: Secondary | ICD-10-CM

## 2020-03-02 DIAGNOSIS — F419 Anxiety disorder, unspecified: Secondary | ICD-10-CM

## 2020-04-03 ENCOUNTER — Telehealth: Payer: Self-pay

## 2020-04-03 NOTE — Telephone Encounter (Signed)
Patient called stating she has been having a tension headache for the last 3 days. States her God son was murdered last Thursday thinks she is stressed. Asked if she could do ibuprofen and muscle relaxer. Per Dr.Mulberry that is fine and to call if symptoms get worse or are not better. Informed patient to call with update on how she is feeling on Monday and if no better ill get her in for appointment. patient verbalized understanding.

## 2020-04-06 NOTE — Telephone Encounter (Signed)
Patient called to provide progress report on tension headache as instructed to do  last Friday . Patient stated pain is not as severe as it was and that she continues taking ibuprofen and muscle relaxer.

## 2020-04-09 NOTE — Telephone Encounter (Signed)
Spoke with patient. States she is not feeling any better. States she still has this headache daily but the intensity of the headache changes day to day. Offered appointment for Monday at 12:30 pm. Patient will call back on Friday and let me know if she can keep appointment for Monday.

## 2020-05-26 ENCOUNTER — Other Ambulatory Visit: Payer: Self-pay | Admitting: Internal Medicine

## 2020-06-03 ENCOUNTER — Encounter: Payer: Medicare HMO | Admitting: Internal Medicine

## 2020-06-04 ENCOUNTER — Ambulatory Visit: Payer: Medicare Other | Admitting: Sports Medicine

## 2020-06-10 ENCOUNTER — Telehealth: Payer: Self-pay | Admitting: Clinical

## 2020-06-10 NOTE — Telephone Encounter (Signed)
MSW Intern called patient to inform her that she is eligible for covid booster shot. Patient stated she would call to schedule vaccine booster when she is back in town. Elenore Rota, MSW Intern 06/10/20

## 2020-06-17 ENCOUNTER — Encounter: Payer: Self-pay | Admitting: Internal Medicine

## 2020-06-17 ENCOUNTER — Ambulatory Visit (INDEPENDENT_AMBULATORY_CARE_PROVIDER_SITE_OTHER): Payer: Medicare Other | Admitting: Internal Medicine

## 2020-06-17 VITALS — BP 129/78 | HR 86 | Resp 14 | Ht 63.0 in | Wt 118.0 lb

## 2020-06-17 DIAGNOSIS — I10 Essential (primary) hypertension: Secondary | ICD-10-CM

## 2020-06-17 DIAGNOSIS — M810 Age-related osteoporosis without current pathological fracture: Secondary | ICD-10-CM

## 2020-06-17 DIAGNOSIS — E782 Mixed hyperlipidemia: Secondary | ICD-10-CM

## 2020-06-17 DIAGNOSIS — Z0000 Encounter for general adult medical examination without abnormal findings: Secondary | ICD-10-CM

## 2020-06-17 DIAGNOSIS — Z23 Encounter for immunization: Secondary | ICD-10-CM

## 2020-06-17 DIAGNOSIS — E119 Type 2 diabetes mellitus without complications: Secondary | ICD-10-CM | POA: Diagnosis not present

## 2020-06-17 LAB — GLUCOSE, POCT (MANUAL RESULT ENTRY): POC Glucose: 101 mg/dl — AB (ref 70–99)

## 2020-06-17 NOTE — Progress Notes (Signed)
Subjective:    Patient ID: Roberta Clark, female   DOB: 1960-09-15, 59 y.o.   MRN: [id number]   HPI   CPE without pap  1.  Pap:  Last performed 06/12/2019 and normal.  2.  Mammogram:  Performed 11/04/2019 and normal.  No family history of breast cancer.  3.  Osteoprevention:  2 servings of milk daily.  4.  Guaiac Cards:  Dexa Bone density showed osteoporosis in March.  Lost her brother just after done and conversation not completed regarding osteoporosis.    5.  Colonoscopy:  Had last colonoscopy with Dr. Michail Sermon, [facility] 06/2019 and no polyps.  She does have history of dysplastic adenomatous polyp.  States his office has contacted her for another colonoscopy  6.  Immunizations:  Has not had influenza or Shingles vaccine.   Immunization History  Administered Date(s) Administered   Influenza Inj Mdck Quad Pf 07/12/2016, 07/10/2017, 06/12/2019   Influenza,inj,Quad PF,6+ Mos 06/06/2018   Influenza,inj,quad, With Preservative 07/15/2017, 06/06/2018   Moderna SARS-COVID-2 Vaccination 10/14/2019, 11/18/2019   Pneumococcal Polysaccharide-23 02/02/2016   Tdap 08/16/2011     7.  Glucose/Cholesterol:  A1C excellent in June at 6.5%.  Has not taken Crestor for some time--states it has not been sent.  Her cholesterol was just above goal with LDL back in June as well.  Reportedly while taking Crestor. Has eye exam coming up with her eye doc.  Lipid Panel     Component Value Date/Time   CHOL 145 01/24/2020 0841   TRIG 74 01/24/2020 0841   HDL 46 01/24/2020 0841   CHOLHDL 4.4 07/25/2012 1413   VLDL 19 07/25/2012 1413   LDLCALC 84 01/24/2020 0841   LDLDIRECT 111 (H) 06/10/2009 1933   LABVLDL 15 01/24/2020 0841     Current Meds  Medication Sig   amitriptyline (ELAVIL) 25 MG tablet TAKE ONE TABLET BY MOUTH AT BEDTIME AS NEEDED SLEEP   Blood Glucose Monitoring Suppl (ONETOUCH VERIO) w/Device KIT Check blood glucose twice daily before meals   gabapentin (NEURONTIN) 600 MG tablet  TAKE ONE TABLET BY MOUTH EVERY MORNING AND AT MIDDAY,AND THEN TAKE TWO TABLETS BY MOUTH AT BEDTIME]   glipiZIDE (GLUCOTROL) 5 MG tablet TAKE ONE-HALF TABLET BY MOUTH TWICE DAILY WITH MEALS   glucose blood (ONETOUCH VERIO) test strip Check Blood glucose twice daily before meals   lisinopril-hydrochlorothiazide (ZESTORETIC) 20-12.5 MG tablet TAKE ONE TABLET BY MOUTH ONCE DAILY   Melatonin 3 MG SUBL Place 6 mg under the tongue at bedtime.   meloxicam (MOBIC) 7.5 MG tablet 2 tabs by mouth once daily (Patient taking differently: Take 15 mg by mouth daily. 2 tabs by mouth once dail)   methocarbamol (ROBAXIN) 500 MG tablet Take 500 mg by mouth every 8 (eight) hours as needed for muscle spasms.    Naproxen Sod-diphenhydrAMINE (ALEVE PM) 220-25 MG TABS Take 2 tablets by mouth at bedtime.   Omega-3 Fatty Acids (FISH OIL) 1000 MG CAPS Take 1,000 mg by mouth 2 (two) times daily.    OneTouch Delica Lancets 68L MISC Check blood glucose twice a day before meals. For Dx E11.9   potassium chloride (KLOR-CON) 10 MEQ tablet Take 1 tablet (10 mEq total) by mouth daily.   Allergies  Allergen Reactions   Atorvastatin Rash   Metformin And Related Nausea And Vomiting   Past Medical History:  Diagnosis Date   Adenomatous colon polyp 05/13/2013   Anemia    Arthritis    Chronic pain    Diabetes  mellitus without complication ([place]) 3545   Type II - diet controlled   Family history of adverse reaction to anesthesia    mother - nausea   Hyperlipidemia 2011   Rash with statin:  not well controlled with Gemfibrozil   Hypertension 2011   Insomnia secondary to chronic pain    Low back pain with left-sided sciatica    Reportedly with lumbar spinal stenosis    Past Surgical History:  Procedure Laterality Date   BACK SURGERY     COLONOSCOPY     LUMBAR LAMINECTOMY/DECOMPRESSION MICRODISCECTOMY Bilateral 05/27/2016   Procedure: Left Lumbar Four-Five Laminectomy and Foraminotomy;  Surgeon: Eustace Moore, MD;   Location: [facility];  Service: Neurosurgery;  Laterality: Bilateral;   SPINAL CORD STIMULATOR INSERTION N/A 07/01/2019   Procedure: Spinal cord stimulator via Thoracic Nine laminectomy;  Surgeon: Eustace Moore, MD;  Location: [facility];  Service: Neurosurgery;  Laterality: N/A;  Spinal cord stimulator via Thoracic Nine laminectomy   TOTAL HIP ARTHROPLASTY Left 10/24/2016   Procedure: LEFT TOTAL HIP ARTHROPLASTY ANTERIOR APPROACH;  Surgeon: Gaynelle Arabian, MD;  Location: [facility];  Service: Orthopedics;  Laterality: Left;   TOTAL HIP ARTHROPLASTY Right 02/22/2017   Procedure: RIGHT TOTAL HIP ARTHROPLASTY ANTERIOR APPROACH;  Surgeon: Gaynelle Arabian, MD;  Location: [facility];  Service: Orthopedics;  Laterality: Right;   TUBAL LIGATION  1985   Family History  Problem Relation Age of Onset   Heart disease Mother 52       Hx of MI    Hypertension Mother    Diabetes Mother    Diabetes Sister    [name]' disease Sister    Lupus Father    Graves' disease Brother    Diabetes Brother    Other Brother        COVID   Arthritis Daughter        Not clear if RA   Hypertension Daughter    Migraines Daughter    Epilepsy Son        Started 37 months of age after bacterial meningitis   GER disease Sister    Social History   Socioeconomic History   Marital status: Significant Other    Spouse name: [name]   Number of children: 2   Years of education: 14   Highest education level: Associate degree: occupational, Hotel manager, or vocational program  Occupational History   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   Children live in town and are supportive.   Originally from [place].   Went to [place].   Social Determinants of Radio broadcast assistant Strain: Not on file  Food Insecurity: No Food Insecurity   Worried About [employer] in the Last Year: Never true   [employer]  in the Last Year: Never true  Transportation Needs: No Transportation Needs   Lack of Transportation (Medical): No   Lack of Transportation (Non-Medical): No  Physical Activity: Not on file  Stress: Not on file  Social Connections: Not on file  Intimate Partner Violence: Not At Risk   Fear of Current or Ex-Partner: No   Emotionally Abused: No   Physically Abused: No   Sexually Abused: No     Review of Systems  All other systems reviewed and are negative.     Objective:   BP 129/78 (BP Location: Right Arm, Patient Position: Sitting, Cuff Size: Normal)   Pulse 86   Resp 14   Ht '5\' 3"'  (1.6 m)   Wt 118 lb (53.5 kg)   BMI 20.90 kg/m   Physical Exam Constitutional:      Appearance: She is normal weight.  HENT:     Head: Normocephalic and atraumatic.     Right Ear: Tympanic membrane, ear canal and external ear normal.     Left Ear: Tympanic membrane, ear canal and external ear normal.     Nose: Nose normal.     Mouth/Throat:     Mouth: Mucous membranes are moist.     Pharynx: Oropharynx is clear.  Eyes:     Extraocular Movements: Extraocular movements intact.     Conjunctiva/sclera: Conjunctivae normal.     Pupils: Pupils are equal, round, and reactive to light.     Comments: Discs sharp bilaterally.  Neck:     Thyroid: No thyroid mass or thyromegaly.  Cardiovascular:     Rate and Rhythm: Normal rate and regular rhythm.     Pulses:          Dorsalis pedis pulses are 2+ on the right side and 2+ on the  left side.       Posterior tibial pulses are 2+ on the right side and 2+ on the left side.     Heart sounds: S1 normal and S2 normal. No murmur heard.   No friction rub. No S3 or S4 sounds.     Comments: No carotid bruits.  Carotid, radial, femoral, DP and PT pulses normal and equal.    Pulmonary:     Effort: Pulmonary effort is normal.     Breath sounds: Normal breath sounds.  Chest:  Breasts:    Right: No inverted nipple, mass, nipple discharge, skin change or tenderness.     Left: No inverted nipple, mass, nipple discharge, skin change or tenderness.  Abdominal:     General: Abdomen is flat. Bowel sounds are normal.     Palpations: Abdomen is soft. There is no hepatomegaly, splenomegaly or mass.     Tenderness: There is no abdominal tenderness.     Hernia: No hernia is present.  Genitourinary:    Comments: Normal external female genitalia No vaginal discharge. No uterine or adnexal mass or tenderness Musculoskeletal:        General: Normal range of motion.     Cervical back: Normal range of motion and neck supple.     Right lower leg: No edema.     Left lower leg: No edema.  Feet:     Right foot:     Protective Sensation: 10 sites tested.  10 sites sensed.     Skin integrity: Skin integrity normal.     Left foot:     Protective Sensation: 10 sites tested.  10 sites sensed.     Skin integrity: Skin integrity normal.  Skin:    General: Skin is warm.     Capillary Refill: Capillary refill takes less than 2 seconds.     Findings: No rash.  Neurological:     General: No focal deficit present.     Mental Status: She is alert and oriented to person, place, and time.     Cranial Nerves: Cranial nerves 2-12 are intact.     Sensory: Sensation is intact.     Motor: Motor function is intact.     Coordination: Coordination is intact.     Gait: Gait is intact.     Deep Tendon Reflexes: Reflexes are normal and symmetric.  Psychiatric:        Attention and Perception: Attention  normal.        Mood and Affect: Mood normal.        Speech: Speech normal.        Behavior: Behavior normal. Behavior is cooperative.   Exam normal  Assessment & Plan   CPE without pap Mammogram in March Colonoscopy follow up as per Dr. Michail Sermon. Influenza vaccine today Covid booster Monday Recommend Shingrix at pharmacy of choice  2.  DM:  A1C and urine microalb/crea in 2 months with fasting labs.  Encouraged water exercise with back and hip issues.   Eye exam soon  3.  Hyperlipidemia:  To get Crestor and start taking again.  FLP in 2 months  4.  Osteoporosis:  Calcium 500-600 mg twice daily with Vitamin D 400 IU twice daily.  Increase exercise.  Alendronate.  Repeat DEXA in 2 years.

## 2020-06-19 ENCOUNTER — Telehealth: Payer: Self-pay | Admitting: Internal Medicine

## 2020-06-19 NOTE — Telephone Encounter (Signed)
Patient called to inform Dr. Amil Amen got filled and is taking  rosuvastatin (CRESTOR) 20 MG tablet. She stated did not know crestor was the same as rosuvastatin.  Just FYI

## 2020-07-02 ENCOUNTER — Telehealth: Payer: Self-pay | Admitting: Internal Medicine

## 2020-07-03 MED ORDER — ALENDRONATE SODIUM 70 MG PO TABS: 70.0000 mg | ORAL_TABLET | ORAL | 11 refills | Status: DC

## 2020-07-03 NOTE — Telephone Encounter (Signed)
Starting Alendronate 70 mg weekly for osteoporosis. Patient to work on smoking cessation--smoking will increase osteoporosis She also needs to take calcium 500 mg once daily and Vitamin D 800 IU (over the counter) if she is not going to increase her milk intake to 4 cups of milk daily.  This is supplementation to be added to the 2 cups of milk she currently drinks daily. She needs to take Alendronate on an empty stomach with 8 oz of water and not eat or take any other medication for 1 hour afterward.  She needs to sit upright for 2 hours after taking. We will recheck a bone density in 2 years of treatment and continue treatment for likely 5 years. If she develops heartburn, need to hear from her--this is the most common side effect.

## 2020-07-03 NOTE — Telephone Encounter (Signed)
Called patient to give Dr. Recommendations. Patient states that was unable to pay attention at the moment and she will call us back to hear the recommendations again.

## 2020-07-03 NOTE — Telephone Encounter (Signed)
Sending again to be sure --did not leave my mailbox

## 2020-07-05 ENCOUNTER — Encounter: Payer: Self-pay | Admitting: Internal Medicine

## 2020-07-07 NOTE — Telephone Encounter (Signed)
Pt. Called stating she was not able to find vitamin D 800. She only found vitamin D3 50 mg and would like to know if is okay for her to take it or not.  Patient did find the calcium 500mg   Please advise

## 2020-07-13 NOTE — Telephone Encounter (Signed)
Spoke with patient will call back she stated the 50mg  of Vitamin D3 will call back with the IU dosage

## 2020-07-30 NOTE — Telephone Encounter (Signed)
Spoke with patient to follow up on the dosage of of vitamin D3. Patient stated that she will call back to give IU dosage.

## 2020-08-05 NOTE — Telephone Encounter (Signed)
Patient called to let Doctor know that she is taking Vitamin D3 80mcg. Patient would like to know if taking one tablet a day is enough or if she needs more than one. Please advise.

## 2020-08-17 ENCOUNTER — Other Ambulatory Visit: Payer: Medicare Other | Admitting: Internal Medicine

## 2020-08-17 ENCOUNTER — Other Ambulatory Visit: Payer: Medicare Other

## 2020-08-19 ENCOUNTER — Other Ambulatory Visit: Payer: Self-pay | Admitting: Internal Medicine

## 2020-08-19 DIAGNOSIS — F32A Depression, unspecified: Secondary | ICD-10-CM

## 2020-08-19 DIAGNOSIS — M5442 Lumbago with sciatica, left side: Secondary | ICD-10-CM

## 2020-08-19 DIAGNOSIS — F419 Anxiety disorder, unspecified: Secondary | ICD-10-CM

## 2020-08-20 ENCOUNTER — Telehealth: Payer: Self-pay | Admitting: Internal Medicine

## 2020-08-25 ENCOUNTER — Other Ambulatory Visit: Payer: Self-pay | Admitting: Internal Medicine

## 2020-08-28 NOTE — Telephone Encounter (Signed)
Spoke with patient and she stated that the prescription she was requesting refill for is meloxicam (MOBIC) 7.5 MG tablet, since she does not have any more.

## 2020-09-16 ENCOUNTER — Other Ambulatory Visit: Payer: Self-pay | Admitting: Internal Medicine

## 2020-09-16 DIAGNOSIS — E119 Type 2 diabetes mellitus without complications: Secondary | ICD-10-CM

## 2020-09-17 ENCOUNTER — Telehealth: Payer: Self-pay | Admitting: Internal Medicine

## 2020-09-17 DIAGNOSIS — E119 Type 2 diabetes mellitus without complications: Secondary | ICD-10-CM

## 2020-09-17 NOTE — Telephone Encounter (Addendum)
Patient called requesting a prescription for a new Blood Glucose Monitor Patient stated that she had lost the one she was using and could not find it. Please send order to [facility] if available, otherwise, she will use [facility] [place].   Patient also stated that she has requested test strips through the pharmacy.

## 2020-09-21 ENCOUNTER — Other Ambulatory Visit: Payer: Self-pay

## 2020-09-21 ENCOUNTER — Other Ambulatory Visit: Payer: Medicare Other | Admitting: Internal Medicine

## 2020-09-21 DIAGNOSIS — E119 Type 2 diabetes mellitus without complications: Secondary | ICD-10-CM

## 2020-09-21 DIAGNOSIS — E782 Mixed hyperlipidemia: Secondary | ICD-10-CM

## 2020-09-21 DIAGNOSIS — Z79899 Other long term (current) drug therapy: Secondary | ICD-10-CM

## 2020-09-21 MED ORDER — ONETOUCH VERIO W/DEVICE KIT: PACK | 0 refills | Status: DC

## 2020-09-21 NOTE — Progress Notes (Signed)
Here for fasting labs:  FLP, CBC, CMP, A1C, urine microalbumin/crea

## 2020-09-21 NOTE — Addendum Note (Signed)
Addended by: Marcelino Duster on: 09/21/2020 12:39 PM   Modules accepted: Orders

## 2020-09-22 ENCOUNTER — Encounter: Payer: Self-pay | Admitting: Internal Medicine

## 2020-09-22 LAB — CBC WITH DIFFERENTIAL/PLATELET
Basophils Absolute: 0.1 10*3/uL (ref 0.0–0.2)
Basos: 1 %
EOS (ABSOLUTE): 0.3 10*3/uL (ref 0.0–0.4)
Eos: 3 %
Hematocrit: 38.8 % (ref 34.0–46.6)
Hemoglobin: 12.8 g/dL (ref 11.1–15.9)
Immature Grans (Abs): 0 10*3/uL (ref 0.0–0.1)
Immature Granulocytes: 0 %
Lymphocytes Absolute: 4.1 10*3/uL — ABNORMAL HIGH (ref 0.7–3.1)
Lymphs: 49 %
MCH: 30.8 pg (ref 26.6–33.0)
MCHC: 33 g/dL (ref 31.5–35.7)
MCV: 93 fL (ref 79–97)
Monocytes Absolute: 0.4 10*3/uL (ref 0.1–0.9)
Monocytes: 5 %
Neutrophils Absolute: 3.5 10*3/uL (ref 1.4–7.0)
Neutrophils: 42 %
Platelets: 287 10*3/uL (ref 150–450)
RBC: 4.16 x10E6/uL (ref 3.77–5.28)
RDW: 12.4 % (ref 11.7–15.4)
WBC: 8.3 10*3/uL (ref 3.4–10.8)

## 2020-09-22 LAB — MICROALBUMIN / CREATININE URINE RATIO
Creatinine, Urine: 85.2 mg/dL
Microalb/Creat Ratio: <4 mg/g creat (ref 0–29)
Microalbumin, Urine: <3 ug/mL

## 2020-09-22 LAB — COMPREHENSIVE METABOLIC PANEL
ALT: 25 IU/L (ref 0–32)
AST: 25 IU/L (ref 0–40)
Albumin/Globulin Ratio: 1.9 (ref 1.2–2.2)
Albumin: 4.7 g/dL (ref 3.8–4.9)
Alkaline Phosphatase: 74 IU/L (ref 44–121)
BUN/Creatinine Ratio: 25 (ref 12–28)
BUN: 19 mg/dL (ref 8–27)
Bilirubin Total: 0.2 mg/dL (ref 0.0–1.2)
CO2: 24 mmol/L (ref 20–29)
Calcium: 9.4 mg/dL (ref 8.7–10.3)
Chloride: 106 mmol/L (ref 96–106)
Creatinine, Ser: 0.77 mg/dL (ref 0.57–1.00)
GFR calc Af Amer: 97 mL/min/{1.73_m2} (ref >59)
GFR calc non Af Amer: 84 mL/min/{1.73_m2} (ref >59)
Globulin, Total: 2.5 g/dL (ref 1.5–4.5)
Glucose: 139 mg/dL — ABNORMAL HIGH (ref 65–99)
Potassium: 4.6 mmol/L (ref 3.5–5.2)
Sodium: 143 mmol/L (ref 134–144)
Total Protein: 7.2 g/dL (ref 6.0–8.5)

## 2020-09-22 LAB — HGB A1C W/O EAG: Hgb A1c MFr Bld: 7.1 % — ABNORMAL HIGH (ref 4.8–5.6)

## 2020-09-22 LAB — LIPID PANEL W/O CHOL/HDL RATIO
Cholesterol, Total: 172 mg/dL (ref 100–199)
HDL: 50 mg/dL (ref >39)
LDL Chol Calc (NIH): 106 mg/dL — ABNORMAL HIGH (ref 0–99)
Triglycerides: 89 mg/dL (ref 0–149)
VLDL Cholesterol Cal: 16 mg/dL (ref 5–40)

## 2020-10-02 ENCOUNTER — Other Ambulatory Visit: Payer: Self-pay | Admitting: Internal Medicine

## 2020-10-05 ENCOUNTER — Ambulatory Visit: Payer: Medicare Other | Attending: Student | Admitting: Physical Therapy

## 2020-10-05 ENCOUNTER — Encounter: Payer: Self-pay | Admitting: Physical Therapy

## 2020-10-05 ENCOUNTER — Other Ambulatory Visit: Payer: Self-pay

## 2020-10-05 ENCOUNTER — Other Ambulatory Visit: Payer: Self-pay | Admitting: Internal Medicine

## 2020-10-05 DIAGNOSIS — M546 Pain in thoracic spine: Secondary | ICD-10-CM | POA: Insufficient documentation

## 2020-10-05 DIAGNOSIS — M25512 Pain in left shoulder: Secondary | ICD-10-CM | POA: Diagnosis present

## 2020-10-05 DIAGNOSIS — M6281 Muscle weakness (generalized): Secondary | ICD-10-CM | POA: Insufficient documentation

## 2020-10-05 DIAGNOSIS — G8929 Other chronic pain: Secondary | ICD-10-CM | POA: Insufficient documentation

## 2020-10-05 DIAGNOSIS — Z1231 Encounter for screening mammogram for malignant neoplasm of breast: Secondary | ICD-10-CM

## 2020-10-05 NOTE — Therapy (Signed)
[place], [place], [address] Phone: [phone]   Fax:  [phone]  Physical Therapy Evaluation  Patient Details  Name: Roberta Clark MRN: [id number] Date of Birth: Feb 07, 1961 Referring Provider (PT): Janice Norrie, NP   Encounter Date: 10/05/2020   PT End of Session - 10/05/20 1342    Visit Number 1    Number of Visits 8    Date for PT Re-Evaluation 11/06/20    Authorization Type UHC MCR, MCD    PT Start Time 0835    PT Stop Time 0925    PT Time Calculation (min) 50 min    Activity Tolerance Patient tolerated treatment well;Patient limited by pain    Behavior During Therapy [facility] for tasks assessed/performed           Past Medical History:  Diagnosis Date  . Adenomatous colon polyp 05/13/2013  . Anemia   . Arthritis   . Chronic pain   . Diabetes mellitus without complication ([place]) 1287   Type II - diet controlled  . Family history of adverse reaction to anesthesia    mother - nausea  . Hyperlipidemia 2011   Rash with statin:  not well controlled with Gemfibrozil  . Hypertension 2011  . Insomnia secondary to chronic pain   . Low back pain with left-sided sciatica    Reportedly with lumbar spinal stenosis    Past Surgical History:  Procedure Laterality Date  . BACK SURGERY    . COLONOSCOPY    . LUMBAR LAMINECTOMY/DECOMPRESSION MICRODISCECTOMY Bilateral 05/27/2016   Procedure: Left Lumbar Four-Five Laminectomy and Foraminotomy;  Surgeon: Eustace Moore, MD;  Location: [place];  Service: Neurosurgery;  Laterality: Bilateral;  . SPINAL CORD STIMULATOR INSERTION N/A 07/01/2019   Procedure: Spinal cord stimulator via Thoracic Nine laminectomy;  Surgeon: Eustace Moore, MD;  Location: [place];  Service: Neurosurgery;  Laterality: N/A;  Spinal cord stimulator via Thoracic Nine laminectomy  . TOTAL HIP ARTHROPLASTY Left 10/24/2016   Procedure: LEFT TOTAL HIP ARTHROPLASTY ANTERIOR APPROACH;  Surgeon:  Gaynelle Arabian, MD;  [facility];  Service: Orthopedics;  Laterality: Left;  . TOTAL HIP ARTHROPLASTY Right 02/22/2017   Procedure: RIGHT TOTAL HIP ARTHROPLASTY ANTERIOR APPROACH;  Surgeon: Gaynelle Arabian, MD;  [facility];  Service: Orthopedics;  Laterality: Right;  . TUBAL LIGATION  1985    There were no vitals filed for this visit.    Subjective Assessment - 10/05/20 0927    Subjective Patient here for PT eval for chronic low back pain.  She has a nerve stimulator for her back pain which aleviates her pain almost completely in lumbar spine. She now has L sided scapular pain which she says that is where the leads are.  Dr. Ronnald Ramp thinks that dry needling may alleviate my pain .  Its as if the more I do, the more it hurts.  Sometimes I can just sit and feel the pain .  It pains on its own.  Has stop and wait for it to go.  Not a spasm.    Pertinent History L THA, R THA, lumbar laminectomy 2019    Limitations Sitting;House hold activities;Lifting;Standing    Diagnostic tests none    Patient Stated Goals Pain relief    Currently in Pain? Yes    Pain Score 8     Pain Location Shoulder    Pain Orientation Left;Mid;Upper;Posterior    Pain Descriptors / Indicators Aching;Nagging    Pain Type Chronic pain  Pain Radiating Towards arm L    Pain Onset More than a month ago    Pain Frequency Constant    Aggravating Factors  activity, also comes on spontaneously    Pain Relieving Factors waiting    Effect of Pain on Daily Activities limits comfort sitting, activity in her home and work    Multiple Pain Sites No   L hip             [facility] PT Assessment - 10/05/20 0001      Assessment   Medical Diagnosis Post Laminectomy    Referring Provider (PT) Janice Norrie, NP    Onset Date/Surgical Date --   chronic   Hand Dominance Right    Next MD Visit after PT    Prior Therapy yes      Precautions   Precaution Comments nerve stimulator (no lifting, 10-15 lbs)      Restrictions    Weight Bearing Restrictions No      Balance Screen   Has the patient fallen in the past 6 months No      [place] residence    Living Arrangements Alone      Prior Function   Level of Independence Independent    Vocation Full time employment    Vocation Requirements [employer]    Leisure limited      Cognition   Overall Cognitive Status Within Functional Limits for tasks assessed      Observation/Other Assessments   Focus on Therapeutic Outcomes (FOTO)  NT time      Sensation   Light Touch Impaired by gross assessment    Additional Comments L hand, fingertips numb      Coordination   Gross Motor Movements are Fluid and Coordinated Not tested      Posture/Postural Control   Posture Comments knees varus, mild forward head      AROM   Overall AROM Comments WFL with pain in L UE    Lumbar Flexion WNL    Lumbar Extension 25%    Lumbar - Right Side Bend WNL    Lumbar - Left Side Bend WNL min pain    Lumbar - Right Rotation WNL with pain    Lumbar - Left Rotation WNL      Strength   Left Shoulder Flexion 4/5    Left Shoulder ABduction 4-/5      Palpation   Palpation comment tender, painful L latissimus, rhomboid, teres minor and thoracic paraspinals.  Pt says she is able to feel the lead from her stimulator but that was lower than the area of complaint             Objective measurements completed on examination: See above findings.         PT Education - 10/05/20 1341    Education Details PT/POC, HEP, muscle pain ,referral of pain , dry needling , MHP    Person(s) Educated Patient    Methods Explanation;Demonstration;Verbal cues;Handout    Comprehension Verbalized understanding;Returned demonstration               PT Long Term Goals - 10/05/20 1345      PT LONG TERM GOAL #1   Title Pt will report min occasional pain in L scapular region, not affecting ADLs    Time 4    Period Weeks    Status New     Target Date 11/06/20      PT LONG TERM  GOAL #2   Title Pt will be able to show 4+/5 or better strength in L UE    Time 4    Period Weeks    Status New    Target Date 11/06/20      PT LONG TERM GOAL #3   Title Pt will be able to reach in all directions without increased pain    Time 4    Period Weeks    Status New    Target Date 11/06/20      PT LONG TERM GOAL #4   Title Pt will be I with HEP for posture, shoulder mobility, strength    Time 4    Period Weeks    Status New    Target Date 11/06/20                  Plan - 10/05/20 1348    Clinical Impression Statement Patient presents for mod complexity eval of L sided mid to upper back pain in the presence of lumbar post-laminectomy (2017).  She came today thinking she would have treatment for this issue but after explaining that an eval is necessary and also the PT she saw today was not trained to perform the procedure.  She almost rescheduled but we were able to discover that she can modualte the pain she is having iwth stretches and manual soft tissue work.  She also thought her pain may be caused by her nerve stimulator but now understands that is not hte case.  She has pain in L scapular region due either to a hyperactive trigger point, MF pain or perhaps more centrally from thoracic spine. Refers to L hand and arm with tingling and numbness.  MHP did help her pain as did the tennis ball.  She has min weakness in L UE.  She should improve with PT and will be treated next visit with dry needling and further manual therapy.    Personal Factors and Comorbidities Comorbidity 3+;Time since onset of injury/illness/exacerbation;Past/Current Experience    Comorbidities hip surgery, back surgery, diabetes, HTN, spinal cord stimulator    Examination-Activity Limitations Sit;Bed Mobility;Bend;Lift;Squat;Carry;Reach Overhead;Stand    Examination-Participation Restrictions Interpersonal Relationship;Occupation;Cleaning;Community  Activity;Driving;Shop    Stability/Clinical Decision Making Evolving/Moderate complexity    Clinical Decision Making Moderate    Rehab Potential Good    PT Frequency 2x / week    PT Duration 4 weeks    PT Treatment/Interventions ADLs/Self Care Home Management;Cryotherapy;Therapeutic exercise;Patient/family education;Manual techniques;Passive range of motion;Dry needling;Taping;Therapeutic activities;Moist Heat;Functional mobility training;Ultrasound    PT Next Visit Plan manual, DN to L inferomedial scap. , check HEP    PT Home Exercise Plan [email or web address]    Consulted and Agree with Plan of Care Patient           Patient will benefit from skilled therapeutic intervention in order to improve the following deficits and impairments:  Decreased mobility,Impaired sensation,Decreased strength,Increased fascial restricitons,Impaired flexibility,Impaired UE functional use,Postural dysfunction,Pain  Visit Diagnosis: Pain in thoracic spine  Chronic left shoulder pain  Muscle weakness (generalized)     Problem List Patient Active Problem List   Diagnosis Date Noted  . S/P insertion of spinal cord stimulator 07/01/2019  . Low back pain with left-sided sciatica   . Long term use of drug 01/24/2019  . Foot pain, bilateral 12/04/2018  . Trochanteric bursitis of left hip 08/29/2018  . Avascular necrosis of bone of hip ([place]) 08/24/2017  . Avascular necrosis of femoral head, right ([place]) 02/22/2017  . Avascular necrosis of  femoral head, left ([place]) 10/24/2016  . OA (osteoarthritis) of hip 10/24/2016  . S/P lumbar laminectomy 05/27/2016  . Type 2 diabetes mellitus without complication, without long-term current use of insulin ([place]) 11/03/2015  . Multilevel degenerative disc disease 10/22/2014  . Chronic back pain 05/27/2014  . Lumbar radiculopathy, chronic 06/14/2013  . Adenomatous colon polyp 05/13/2013  . Vaginal discharge 08/22/2012  . Hypertension 07/25/2012  . Preventative health care  07/25/2012  . Hot flash, menopausal 07/25/2012  . Chronic low back pain 07/25/2012  . Fibroids 09/02/2011  . Tobacco dependence 09/02/2011  . Hyperlipidemia 08/15/2009    Katelyn Broadnax 10/05/2020, 2:00 PM  [facility] [address] [place], [place], [address] Phone: [phone]   Fax:  [phone]  Name: Roberta Clark MRN: [id number] Date of Birth: 11-05-1960   Raeford Razor, PT 10/05/20 2:01 PM Phone: [phone] Fax: [phone]

## 2020-10-05 NOTE — Patient Instructions (Addendum)
  Access Code: [email or web address]    URL: [email or web address]: 02/21/2022Prepared by: Anderson Malta PaaExercises  Supine Scapular Retraction - 2 x daily - 7 x weekly - 2 sets - 10 reps - 5 hold  Supine Shoulder Flexion with Dowel - 2 x daily - 7 x weekly - 2 sets - 10 reps - 10 hold  Supine Shoulder Horizontal Abduction with Resistance - 2 x daily - 7 x weekly - 2 sets - 10 reps - 5 hold  Standing Bilateral Low Shoulder Row with Anchored Resistance - 2 x daily - 7 x weekly - 2 sets - 10 reps - 5 hold     Trigger Point Dry Needling  . What is Trigger Point Dry Needling (DN)? o DN is a physical therapy technique used to treat muscle pain and dysfunction. Specifically, DN helps deactivate muscle trigger points (muscle knots).  o A thin filiform needle is used to penetrate the skin and stimulate the underlying trigger point. The goal is for a local twitch response (LTR) to occur and for the trigger point to relax. No medication of any kind is injected during the procedure.   . What Does Trigger Point Dry Needling Feel Like?  o The procedure feels different for each individual patient. Some patients report that they do not actually feel the needle enter the skin and overall the process is not painful. Very mild bleeding may occur. However, many patients feel a deep cramping in the muscle in which the needle was inserted. This is the local twitch response.   Marland Kitchen How Will I feel after the treatment? o Soreness is normal, and the onset of soreness may not occur for a few hours. Typically this soreness does not last longer than two days.  o Bruising is uncommon, however; ice can be used to decrease any possible bruising.  o In rare cases feeling tired or nauseous after the treatment is normal. In addition, your symptoms may get worse before they get better, this period will typically not last longer than 24 hours.   . What Can I do After My Treatment? o Increase your hydration by drinking more  water for the next 24 hours. o You may place ice or heat on the areas treated that have become sore, however, do not use heat on inflamed or bruised areas. Heat often brings more relief post needling. o You can continue your regular activities, but vigorous activity is not recommended initially after the treatment for 24 hours. o DN is best combined with other physical therapy such as strengthening, stretching, and other therapies.

## 2020-10-07 ENCOUNTER — Ambulatory Visit: Payer: Medicare Other | Admitting: Physical Therapy

## 2020-10-07 ENCOUNTER — Encounter: Payer: Self-pay | Admitting: Physical Therapy

## 2020-10-07 ENCOUNTER — Other Ambulatory Visit: Payer: Self-pay

## 2020-10-07 DIAGNOSIS — G8929 Other chronic pain: Secondary | ICD-10-CM

## 2020-10-07 DIAGNOSIS — M546 Pain in thoracic spine: Secondary | ICD-10-CM

## 2020-10-07 DIAGNOSIS — M25512 Pain in left shoulder: Secondary | ICD-10-CM

## 2020-10-07 DIAGNOSIS — M6281 Muscle weakness (generalized): Secondary | ICD-10-CM

## 2020-10-07 NOTE — Therapy (Signed)
[place], [place], [address] Phone: [phone]   Fax:  [phone]  Physical Therapy Treatment  Patient Details  Name: Roberta Clark MRN: [id number] Date of Birth: Sep 18, 1960 Referring Provider (PT): Janice Norrie, NP   Encounter Date: 10/07/2020   PT End of Session - 10/07/20 2947    Visit Number 2    Number of Visits 8    Date for PT Re-Evaluation 11/06/20    Authorization Type UHC MCR, MCD    PT Start Time 0921    PT Stop Time 1019   time spent for dry needling and MHP not included in direct timed tx. minutes   PT Time Calculation (min) 58 min    Activity Tolerance Patient tolerated treatment well    Behavior During Therapy [facility] for tasks assessed/performed           Past Medical History:  Diagnosis Date  . Adenomatous colon polyp 05/13/2013  . Anemia   . Arthritis   . Chronic pain   . Diabetes mellitus without complication ([place]) 6546   Type II - diet controlled  . Family history of adverse reaction to anesthesia    mother - nausea  . Hyperlipidemia 2011   Rash with statin:  not well controlled with Gemfibrozil  . Hypertension 2011  . Insomnia secondary to chronic pain   . Low back pain with left-sided sciatica    Reportedly with lumbar spinal stenosis    Past Surgical History:  Procedure Laterality Date  . BACK SURGERY    . COLONOSCOPY    . LUMBAR LAMINECTOMY/DECOMPRESSION MICRODISCECTOMY Bilateral 05/27/2016   Procedure: Left Lumbar Four-Five Laminectomy and Foraminotomy;  Surgeon: Eustace Moore, MD;  Location: [place];  Service: Neurosurgery;  Laterality: Bilateral;  . SPINAL CORD STIMULATOR INSERTION N/A 07/01/2019   Procedure: Spinal cord stimulator via Thoracic Nine laminectomy;  Surgeon: Eustace Moore, MD;  Location: [place];  Service: Neurosurgery;  Laterality: N/A;  Spinal cord stimulator via Thoracic Nine laminectomy  . TOTAL HIP ARTHROPLASTY Left 10/24/2016   Procedure: LEFT  TOTAL HIP ARTHROPLASTY ANTERIOR APPROACH;  Surgeon: Gaynelle Arabian, MD;  [facility];  Service: Orthopedics;  Laterality: Left;  . TOTAL HIP ARTHROPLASTY Right 02/22/2017   Procedure: RIGHT TOTAL HIP ARTHROPLASTY ANTERIOR APPROACH;  Surgeon: Gaynelle Arabian, MD;  [facility];  Service: Orthopedics;  Laterality: Right;  . TUBAL LIGATION  1985    There were no vitals filed for this visit.   Subjective Assessment - 10/07/20 0922    Subjective Pain/symptoms the same as at eval with left periscapular region and thoracic pain 8/10 this AM and worse with movement.    Pertinent History L THA, R THA, lumbar laminectomy 2019    Currently in Pain? Yes    Pain Score 8     Pain Location Shoulder    Pain Orientation Left;Mid;Upper;Posterior    Pain Descriptors / Indicators Throbbing    Pain Type Chronic pain    Pain Onset More than a month ago    Pain Frequency Constant    Aggravating Factors  movement    Pain Relieving Factors heat                             [facility] Adult PT Treatment/Exercise - 10/07/20 0001      Exercises   Exercises Shoulder      Shoulder Exercises: Prone   Other Prone Exercises prone  left side row in slight abduction x 15 reps      Shoulder Exercises: Sidelying   External Rotation AROM;Left;15 reps      Shoulder Exercises: Stretch   Other Shoulder Stretches Left rhomboid stretch in doorway 3x20 sec    Other Shoulder Stretches brief instruction seated latissimus stretch from table slide variation      Modalities   Modalities Moist Heat      Moist Heat Therapy   Number Minutes Moist Heat 10 Minutes    Moist Heat Location --   left upper thoracic/periscapular region in supine with legs propped     Manual Therapy   Manual Therapy Soft tissue mobilization    Soft tissue mobilization STM/manual trigger pint release left rhomboids and thoracic paraspinals in sidelying and prone            Trigger Point Dry Needling - 10/07/20 0001     Consent Given? Yes    Education Handout Provided Yes    Muscles Treated Upper Quadrant Infraspinatus;Latissimus dorsi;Teres minor;Rhomboids   left side   Muscles Treated Back/Hip Erector spinae   left thoracic longissimus T4-5 region   Dry Needling Comments needling to left infrapsinatus, teres minor and latissimus in right sidelying, needling to left rhomboids in prone at scapula with "hammerlock" position and transverese                PT Education - 10/07/20 1014    Education Details dry needling, Theracane vs. tennis ball use for trigger point release    Person(s) Educated Patient    Methods Explanation;Demonstration;Verbal cues;Handout    Comprehension Verbalized understanding;Returned demonstration               PT Long Term Goals - 10/05/20 1345      PT LONG TERM GOAL #1   Title Pt will report min occasional pain in L scapular region, not affecting ADLs    Time 4    Period Weeks    Status New    Target Date 11/06/20      PT LONG TERM GOAL #2   Title Pt will be able to show 4+/5 or better strength in L UE    Time 4    Period Weeks    Status New    Target Date 11/06/20      PT LONG TERM GOAL #3   Title Pt will be able to reach in all directions without increased pain    Time 4    Period Weeks    Status New    Target Date 11/06/20      PT LONG TERM GOAL #4   Title Pt will be I with HEP for posture, shoulder mobility, strength    Time 4    Period Weeks    Status New    Target Date 11/06/20                 Plan - 10/07/20 1015    Clinical Impression Statement Trial dry needling today to muscles as noted per flowsheet-brief soreness but overall procedure well-tolerated though expect may take 1-2 days to note results so will await further tx. response. Tx. focus otherwise STM and gentle ROM/stretches iwth good tolerance. Therapy goals still ongoing with gradual response to therapy expected given symptom chronicity.    Personal Factors and  Comorbidities Comorbidity 3+;Time since onset of injury/illness/exacerbation;Past/Current Experience    Comorbidities hip surgery, back surgery, diabetes, HTN, spinal cord stimulator    Examination-Activity Limitations Sit;Bed Mobility;Bend;Lift;Squat;Carry;Reach Overhead;Stand  Examination-Participation Restrictions Interpersonal Relationship;Occupation;Cleaning;Community Activity;Driving;Shop    Clinical Decision Making Moderate    Rehab Potential Good    PT Frequency 2x / week    PT Duration 4 weeks    PT Treatment/Interventions ADLs/Self Care Home Management;Cryotherapy;Therapeutic exercise;Patient/family education;Manual techniques;Passive range of motion;Dry needling;Taping;Therapeutic activities;Moist Heat;Functional mobility training;Ultrasound    PT Next Visit Plan check response dry needling and continue as found beneficial, continue manual, exercise progression as tolerated    PT Home Exercise Plan [email or web address]    Consulted and Agree with Plan of Care Patient           Patient will benefit from skilled therapeutic intervention in order to improve the following deficits and impairments:  Decreased mobility,Impaired sensation,Decreased strength,Increased fascial restricitons,Impaired flexibility,Impaired UE functional use,Postural dysfunction,Pain  Visit Diagnosis: Pain in thoracic spine  Chronic left shoulder pain  Muscle weakness (generalized)     Problem List Patient Active Problem List   Diagnosis Date Noted  . S/P insertion of spinal cord stimulator 07/01/2019  . Low back pain with left-sided sciatica   . Long term use of drug 01/24/2019  . Foot pain, bilateral 12/04/2018  . Trochanteric bursitis of left hip 08/29/2018  . Avascular necrosis of bone of hip ([place]) 08/24/2017  . Avascular necrosis of femoral head, right ([place]) 02/22/2017  . Avascular necrosis of femoral head, left ([place]) 10/24/2016  . OA (osteoarthritis) of hip 10/24/2016  . S/P lumbar laminectomy  05/27/2016  . Type 2 diabetes mellitus without complication, without long-term current use of insulin ([place]) 11/03/2015  . Multilevel degenerative disc disease 10/22/2014  . Chronic back pain 05/27/2014  . Lumbar radiculopathy, chronic 06/14/2013  . Adenomatous colon polyp 05/13/2013  . Vaginal discharge 08/22/2012  . Hypertension 07/25/2012  . Preventative health care 07/25/2012  . Hot flash, menopausal 07/25/2012  . Chronic low back pain 07/25/2012  . Fibroids 09/02/2011  . Tobacco dependence 09/02/2011  . Hyperlipidemia 08/15/2009    Beaulah Dinning, PT, DPT 10/07/20 10:17 AM  [facility] [address] [place], [place], [address] Phone: [phone]   Fax:  [phone]  Name: Kyrsten Deleeuw MRN: [id number] Date of Birth: [birth year]

## 2020-10-07 NOTE — Patient Instructions (Signed)

## 2020-10-12 ENCOUNTER — Other Ambulatory Visit: Payer: Self-pay | Admitting: Internal Medicine

## 2020-10-27 ENCOUNTER — Encounter: Payer: Self-pay | Admitting: Physical Therapy

## 2020-10-27 ENCOUNTER — Other Ambulatory Visit: Payer: Self-pay

## 2020-10-27 ENCOUNTER — Ambulatory Visit: Payer: Medicare Other | Attending: Student | Admitting: Physical Therapy

## 2020-10-27 DIAGNOSIS — M546 Pain in thoracic spine: Secondary | ICD-10-CM | POA: Diagnosis present

## 2020-10-27 DIAGNOSIS — G8929 Other chronic pain: Secondary | ICD-10-CM | POA: Insufficient documentation

## 2020-10-27 DIAGNOSIS — M25512 Pain in left shoulder: Secondary | ICD-10-CM | POA: Insufficient documentation

## 2020-10-27 DIAGNOSIS — M6281 Muscle weakness (generalized): Secondary | ICD-10-CM | POA: Insufficient documentation

## 2020-10-27 NOTE — Therapy (Signed)
[place], [place], [address] Phone: [phone]   Fax:  [phone]  Physical Therapy Treatment  Patient Details  Name: Roberta Clark MRN: [id number] Date of Birth: 24-Mar-1961 Referring Provider (PT): Janice Norrie, NP   Encounter Date: 10/27/2020   PT End of Session - 10/27/20 1633    Visit Number 3    Number of Visits 8    Date for PT Re-Evaluation 11/06/20    Authorization Type UHC MCR, MCD    Progress Note Due on Visit 10    PT Start Time 1630    PT Stop Time 1719    PT Time Calculation (min) 49 min    Activity Tolerance Patient tolerated treatment well    Behavior During Therapy [facility] for tasks assessed/performed           Past Medical History:  Diagnosis Date  . Adenomatous colon polyp 05/13/2013  . Anemia   . Arthritis   . Chronic pain   . Diabetes mellitus without complication ([place]) 5035   Type II - diet controlled  . Family history of adverse reaction to anesthesia    mother - nausea  . Hyperlipidemia 2011   Rash with statin:  not well controlled with Gemfibrozil  . Hypertension 2011  . Insomnia secondary to chronic pain   . Low back pain with left-sided sciatica    Reportedly with lumbar spinal stenosis    Past Surgical History:  Procedure Laterality Date  . BACK SURGERY    . COLONOSCOPY    . LUMBAR LAMINECTOMY/DECOMPRESSION MICRODISCECTOMY Bilateral 05/27/2016   Procedure: Left Lumbar Four-Five Laminectomy and Foraminotomy;  Surgeon: Eustace Moore, MD;  Location: [facility];  Service: Neurosurgery;  Laterality: Bilateral;  . SPINAL CORD STIMULATOR INSERTION N/A 07/01/2019   Procedure: Spinal cord stimulator via Thoracic Nine laminectomy;  Surgeon: Eustace Moore, MD;  Location: [facility];  Service: Neurosurgery;  Laterality: N/A;  Spinal cord stimulator via Thoracic Nine laminectomy  . TOTAL HIP ARTHROPLASTY Left 10/24/2016   Procedure: LEFT TOTAL HIP ARTHROPLASTY ANTERIOR APPROACH;   Surgeon: Gaynelle Arabian, MD;  [facility];  Service: Orthopedics;  Laterality: Left;  . TOTAL HIP ARTHROPLASTY Right 02/22/2017   Procedure: RIGHT TOTAL HIP ARTHROPLASTY ANTERIOR APPROACH;  Surgeon: Gaynelle Arabian, MD;  [facility];  Service: Orthopedics;  Laterality: Right;  . TUBAL LIGATION  1985    There were no vitals filed for this visit.   Subjective Assessment - 10/27/20 1636    Subjective " I was doing good for aminute after the last session. I am in a lot of pain today.    Currently in Pain? Yes    Pain Score 9     Pain Location Shoulder    Pain Orientation Left    Pain Descriptors / Indicators Throbbing;Aching    Pain Type Chronic pain    Pain Onset More than a month ago    Pain Frequency Constant    Aggravating Factors  movement                             [facility] Adult PT Treatment/Exercise - 10/27/20 0001      Shoulder Exercises: Seated   Row Strengthening;Both;10 reps   x 2 sets   Row Limitations cues for proper form    Other Seated Exercises scapular retractoin and double ER 2 x 10 with red theraband      Shoulder Exercises:  ROM/Strengthening   Nustep L5 x 5 min UE/LE      Shoulder Exercises: Stretch   Other Shoulder Stretches L rhomboid stretch 2 x 30 sec    Other Shoulder Stretches cross arm stretch 2 x 30 sec      Moist Heat Therapy   Number Minutes Moist Heat 10 Minutes    Moist Heat Location Other (comment)   L thoracic spine in decompression pos.     Manual Therapy   Manual therapy comments skilled palpation and monitoring of pt throughout TPDN    Soft tissue mobilization IASTM along L rhonboids, thoracic multifidi, upper trap            Trigger Point Dry Needling - 10/27/20 0001    Consent Given? Yes    Education Handout Provided Previously provided    Muscles Treated Head and Neck Upper trapezius    Muscles Treated Upper Quadrant Infraspinatus;Rhomboids    Muscles Treated Back/Hip Erector spinae    Upper Trapezius  Response Twitch reponse elicited;Palpable increased muscle length   L   Rhomboids Response Twitch response elicited;Palpable increased muscle length   L   Infraspinatus Response Twitch response elicited;Palpable increased muscle length    Erector spinae Response Twitch response elicited;Palpable increased muscle length   L thoracic between T2-T5                    PT Long Term Goals - 10/05/20 1345      PT LONG TERM GOAL #1   Title Pt will report min occasional pain in L scapular region, not affecting ADLs    Time 4    Period Weeks    Status New    Target Date 11/06/20      PT LONG TERM GOAL #2   Title Pt will be able to show 4+/5 or better strength in L UE    Time 4    Period Weeks    Status New    Target Date 11/06/20      PT LONG TERM GOAL #3   Title Pt will be able to reach in all directions without increased pain    Time 4    Period Weeks    Status New    Target Date 11/06/20      PT LONG TERM GOAL #4   Title Pt will be I with HEP for posture, shoulder mobility, strength    Time 4    Period Weeks    Status New    Target Date 11/06/20                 Plan - 10/27/20 1731    Clinical Impression Statement pt reported relief with DN following the last session for a few days. Continued TPDN today focusing on the L periscapular region followed with IASTM techniques. worked on posterior Counsellor which she noted gradual relief of tension noted along the L posterior shoulder continued MHP end of session.    PT Treatment/Interventions ADLs/Self Care Home Management;Cryotherapy;Therapeutic exercise;Patient/family education;Manual techniques;Passive range of motion;Dry needling;Taping;Therapeutic activities;Moist Heat;Functional mobility training;Ultrasound    PT Next Visit Plan check response dry needling and continue as found beneficial, continue manual, exercise progression as tolerated    Consulted and Agree with Plan of Care Patient            Patient will benefit from skilled therapeutic intervention in order to improve the following deficits and impairments:  Decreased mobility,Impaired sensation,Decreased strength,Increased fascial restricitons,Impaired flexibility,Impaired UE functional use,Postural dysfunction,Pain  Visit Diagnosis: Pain in thoracic spine  Chronic left shoulder pain  Muscle weakness (generalized)     Problem List Patient Active Problem List   Diagnosis Date Noted  . S/P insertion of spinal cord stimulator 07/01/2019  . Low back pain with left-sided sciatica   . Long term use of drug 01/24/2019  . Foot pain, bilateral 12/04/2018  . Trochanteric bursitis of left hip 08/29/2018  . Avascular necrosis of bone of hip ([place]) 08/24/2017  . Avascular necrosis of femoral head, right ([place]) 02/22/2017  . Avascular necrosis of femoral head, left ([place]) 10/24/2016  . OA (osteoarthritis) of hip 10/24/2016  . S/P lumbar laminectomy 05/27/2016  . Type 2 diabetes mellitus without complication, without long-term current use of insulin ([place]) 11/03/2015  . Multilevel degenerative disc disease 10/22/2014  . Chronic back pain 05/27/2014  . Lumbar radiculopathy, chronic 06/14/2013  . Adenomatous colon polyp 05/13/2013  . Vaginal discharge 08/22/2012  . Hypertension 07/25/2012  . Preventative health care 07/25/2012  . Hot flash, menopausal 07/25/2012  . Chronic low back pain 07/25/2012  . Fibroids 09/02/2011  . Tobacco dependence 09/02/2011  . Hyperlipidemia 08/15/2009   Starr Lake PT, DPT, LAT, ATC  10/27/20  5:34 PM      [facility] [address] [place], [place], [address] Phone: [phone]   Fax:  [phone]  Name: Roberta Clark MRN: [id number] Date of Birth: 1961/03/02

## 2020-11-02 ENCOUNTER — Ambulatory Visit: Payer: Medicare Other | Admitting: Physical Therapy

## 2020-11-08 NOTE — Telephone Encounter (Signed)
I missed this note before--she should be fine with 50 mcg of Vitamin D3 daily. We should check her Vitamin D level next time she is in for labs.  Please notify and apologize for the long delay in replying.

## 2020-11-09 ENCOUNTER — Ambulatory Visit: Payer: Medicare Other | Admitting: Physical Therapy

## 2020-11-09 ENCOUNTER — Other Ambulatory Visit: Payer: Self-pay | Admitting: Internal Medicine

## 2020-11-09 ENCOUNTER — Other Ambulatory Visit: Payer: Self-pay

## 2020-11-09 DIAGNOSIS — M546 Pain in thoracic spine: Secondary | ICD-10-CM

## 2020-11-09 DIAGNOSIS — M6281 Muscle weakness (generalized): Secondary | ICD-10-CM

## 2020-11-09 DIAGNOSIS — G8929 Other chronic pain: Secondary | ICD-10-CM

## 2020-11-09 NOTE — Therapy (Signed)
[place], [place], [address] Phone: [phone]   Fax:  [phone]  Physical Therapy Treatment / Re-certification  Patient Details  Name: Roberta Clark MRN: [id number] Date of Birth: [birth year] Referring Provider (PT): Janice Norrie, NP   Encounter Date: 11/09/2020   PT End of Session - 11/09/20 1647    Visit Number 4    Number of Visits 8    Date for PT Re-Evaluation 12/07/20    Authorization Type UHC MCR, MCD    Progress Note Due on Visit 10    PT Start Time 1648   pt arrived 18 min late today   PT Stop Time 1713    PT Time Calculation (min) 25 min    Activity Tolerance Patient tolerated treatment well    Behavior During Therapy [facility] for tasks assessed/performed           Past Medical History:  Diagnosis Date  . Adenomatous colon polyp 05/13/2013  . Anemia   . Arthritis   . Chronic pain   . Diabetes mellitus without complication ([place]) 5003   Type II - diet controlled  . Family history of adverse reaction to anesthesia    mother - nausea  . Hyperlipidemia 2011   Rash with statin:  not well controlled with Gemfibrozil  . Hypertension 2011  . Insomnia secondary to chronic pain   . Low back pain with left-sided sciatica    Reportedly with lumbar spinal stenosis    Past Surgical History:  Procedure Laterality Date  . BACK SURGERY    . COLONOSCOPY    . LUMBAR LAMINECTOMY/DECOMPRESSION MICRODISCECTOMY Bilateral 05/27/2016   Procedure: Left Lumbar Four-Five Laminectomy and Foraminotomy;  Surgeon: Eustace Moore, MD;  Location: [facility];  Service: Neurosurgery;  Laterality: Bilateral;  . SPINAL CORD STIMULATOR INSERTION N/A 07/01/2019   Procedure: Spinal cord stimulator via Thoracic Nine laminectomy;  Surgeon: Eustace Moore, MD;  Location: [facility];  Service: Neurosurgery;  Laterality: N/A;  Spinal cord stimulator via Thoracic Nine laminectomy  . TOTAL HIP ARTHROPLASTY Left 10/24/2016   Procedure:  LEFT TOTAL HIP ARTHROPLASTY ANTERIOR APPROACH;  Surgeon: Gaynelle Arabian, MD;  [facility];  Service: Orthopedics;  Laterality: Left;  . TOTAL HIP ARTHROPLASTY Right 02/22/2017   Procedure: RIGHT TOTAL HIP ARTHROPLASTY ANTERIOR APPROACH;  Surgeon: Gaynelle Arabian, MD;  [facility];  Service: Orthopedics;  Laterality: Right;  . TUBAL LIGATION  1985    There were no vitals filed for this visit.   Subjective Assessment - 11/09/20 1649    Subjective "I am not feeling much better, I am still feeling about the same. I feel alot better after doing the exercise, but then a few days the pain starts back."    Currently in Pain? Yes    Pain Score 10-Worst pain ever   pt declines going to the hospital today.   Pain Location Shoulder    Pain Orientation Left    Pain Descriptors / Indicators Aching;Throbbing    Pain Type Chronic pain    Pain Onset More than a month ago    Pain Frequency Constant    Aggravating Factors  unsure as to what makes it worse.    Pain Relieving Factors heat              [facility] PT Assessment - 11/09/20 0001      Assessment   Medical Diagnosis Post Laminectomy    Referring Provider (PT) Janice Norrie, NP  [place] Adult PT Treatment/Exercise - 11/09/20 0001      Shoulder Exercises: Stretch   Other Shoulder Stretches upper trap, levator scapulae stretch 2 x 30 sec    Other Shoulder Stretches rhomboid stretch seated with arms together reaching down, and crossed on bar leaning back 2 x 30 sec ea.      Manual Therapy   Manual therapy comments MTPR along the L upper trap, levator scapulae, rhonboids with graduate pressure to ease pain and maximize benefit    Soft tissue mobilization IASTM along L rhonboids, thoracic multifidi, upper trap                  PT Education - 11/09/20 1716    Education Details Reviewed HEP and benefits of self trigger point release    Person(s) Educated Patient    Methods  Explanation;Verbal cues    Comprehension Verbal cues required;Verbalized understanding               PT Long Term Goals - 11/09/20 1652      PT LONG TERM GOAL #1   Title Pt will report min occasional pain in L scapular region, not affecting ADLs    Baseline today pain si a 10/10    Period Weeks    Status On-going    Target Date 12/07/20      PT LONG TERM GOAL #2   Title Pt will be able to show 4+/5 or better strength in L UE    Baseline unable to test today due to pain    Period Weeks    Status Unable to assess    Target Date 12/07/20      PT LONG TERM GOAL #3   Title Pt will be able to reach in all directions without increased pain    Baseline continues    Period Weeks    Status On-going    Target Date 12/07/20      PT LONG TERM GOAL #4   Title Pt will be I with HEP for posture, shoulder mobility, strength    Time 4    Period Weeks    Status On-going    Target Date 12/07/20                 Plan - 11/09/20 1713    Clinical Impression Statement limited session due to pt arriving late. She reports pain is 10/10 today but declined going to the ED. limited goal assessment secondary to elevated pain/ irritabilty. She responded well to MTPR focusing on the upper trap/ levator scpaulae and rhomboids followed with stretching which she reported relief of pain to "1/10 maybe". She would benefit continued physical therapy to decrease neck/ shoulder pain, promote efficient posture and lifting mechanics and maximize her function by addressing the deficits listed.    PT Frequency 1x / week    PT Duration 4 weeks    PT Treatment/Interventions ADLs/Self Care Home Management;Cryotherapy;Therapeutic exercise;Patient/family education;Manual techniques;Passive range of motion;Dry needling;Taping;Therapeutic activities;Moist Heat;Functional mobility training;Ultrasound    PT Next Visit Plan check response dry needling and continue as found beneficial, continue manual, exercise  progression as tolerated, posterior shoulder strengthening, assess work related task.    Consulted and Agree with Plan of Care Patient           Patient will benefit from skilled therapeutic intervention in order to improve the following deficits and impairments:  Decreased mobility,Impaired sensation,Decreased strength,Increased fascial restricitons,Impaired flexibility,Impaired UE functional use,Postural dysfunction,Pain  Visit Diagnosis: Pain in thoracic spine  Chronic left shoulder pain  Muscle weakness (generalized)     Problem List Patient Active Problem List   Diagnosis Date Noted  . S/P insertion of spinal cord stimulator 07/01/2019  . Low back pain with left-sided sciatica   . Long term use of drug 01/24/2019  . Foot pain, bilateral 12/04/2018  . Trochanteric bursitis of left hip 08/29/2018  . Avascular necrosis of bone of hip ([place]) 08/24/2017  . Avascular necrosis of femoral head, right ([place]) 02/22/2017  . Avascular necrosis of femoral head, left ([place]) 10/24/2016  . OA (osteoarthritis) of hip 10/24/2016  . S/P lumbar laminectomy 05/27/2016  . Type 2 diabetes mellitus without complication, without long-term current use of insulin ([place]) 11/03/2015  . Multilevel degenerative disc disease 10/22/2014  . Chronic back pain 05/27/2014  . Lumbar radiculopathy, chronic 06/14/2013  . Adenomatous colon polyp 05/13/2013  . Vaginal discharge 08/22/2012  . Hypertension 07/25/2012  . Preventative health care 07/25/2012  . Hot flash, menopausal 07/25/2012  . Chronic low back pain 07/25/2012  . Fibroids 09/02/2011  . Tobacco dependence 09/02/2011  . Hyperlipidemia 08/15/2009   Starr Lake PT, DPT, LAT, ATC  11/09/20  5:19 PM      [facility] [address] [place], [place], [address] Phone: [phone]   Fax:  [phone]  Name: Roberta Clark MRN: [id number] Date of Birth: 1961/06/19

## 2020-11-13 NOTE — Telephone Encounter (Signed)
Spoke with patient and she stated that she's been taking the Vitam D. I also notified the patient that Doctor will recheck Vitamin D on next lab appointment.  Patient stated that she wants to talk about osteoporosis on her next office visit.

## 2020-11-16 ENCOUNTER — Other Ambulatory Visit: Payer: Self-pay

## 2020-11-16 ENCOUNTER — Ambulatory Visit: Payer: Medicare Other | Attending: Student | Admitting: Physical Therapy

## 2020-11-16 DIAGNOSIS — M546 Pain in thoracic spine: Secondary | ICD-10-CM | POA: Insufficient documentation

## 2020-11-16 DIAGNOSIS — G8929 Other chronic pain: Secondary | ICD-10-CM | POA: Diagnosis present

## 2020-11-16 DIAGNOSIS — M25512 Pain in left shoulder: Secondary | ICD-10-CM | POA: Insufficient documentation

## 2020-11-16 DIAGNOSIS — M6281 Muscle weakness (generalized): Secondary | ICD-10-CM | POA: Diagnosis present

## 2020-11-16 NOTE — Therapy (Signed)
[place], [place], [address] Phone: [phone]   Fax:  [phone]  Physical Therapy Treatment  Patient Details  Name: Roberta Clark MRN: [id number] Date of Birth: 1961/07/19 Referring Provider (PT): Janice Norrie, NP   Encounter Date: 11/16/2020   PT End of Session - 11/16/20 1629    Visit Number 5    Number of Visits 8    Date for PT Re-Evaluation 12/07/20    Authorization Type UHC MCR, MCD    Progress Note Due on Visit 10    PT Start Time 1630    PT Stop Time 1712    PT Time Calculation (min) 42 min    Activity Tolerance Patient tolerated treatment well    Behavior During Therapy [facility] for tasks assessed/performed           Past Medical History:  Diagnosis Date  . Adenomatous colon polyp 05/13/2013  . Anemia   . Arthritis   . Chronic pain   . Diabetes mellitus without complication ([place]) 3149   Type II - diet controlled  . Family history of adverse reaction to anesthesia    mother - nausea  . Hyperlipidemia 2011   Rash with statin:  not well controlled with Gemfibrozil  . Hypertension 2011  . Insomnia secondary to chronic pain   . Low back pain with left-sided sciatica    Reportedly with lumbar spinal stenosis    Past Surgical History:  Procedure Laterality Date  . BACK SURGERY    . COLONOSCOPY    . LUMBAR LAMINECTOMY/DECOMPRESSION MICRODISCECTOMY Bilateral 05/27/2016   Procedure: Left Lumbar Four-Five Laminectomy and Foraminotomy;  Surgeon: Eustace Moore, MD;  Location: [facility];  Service: Neurosurgery;  Laterality: Bilateral;  . SPINAL CORD STIMULATOR INSERTION N/A 07/01/2019   Procedure: Spinal cord stimulator via Thoracic Nine laminectomy;  Surgeon: Eustace Moore, MD;  Location: [facility];  Service: Neurosurgery;  Laterality: N/A;  Spinal cord stimulator via Thoracic Nine laminectomy  . TOTAL HIP ARTHROPLASTY Left 10/24/2016   Procedure: LEFT TOTAL HIP ARTHROPLASTY ANTERIOR APPROACH;   Surgeon: Gaynelle Arabian, MD;  [facility];  Service: Orthopedics;  Laterality: Left;  . TOTAL HIP ARTHROPLASTY Right 02/22/2017   Procedure: RIGHT TOTAL HIP ARTHROPLASTY ANTERIOR APPROACH;  Surgeon: Gaynelle Arabian, MD;  [facility];  Service: Orthopedics;  Laterality: Right;  . TUBAL LIGATION  1985    There were no vitals filed for this visit.   Subjective Assessment - 11/16/20 1630    Subjective "I felt good after the last session. I am having pain in the L arm, and it is going across the top of the shoulder. pt reports pain ais a 8/10 today ."    Patient Stated Goals Pain relief    Currently in Pain? Yes    Pain Score 8     Pain Location Shoulder    Pain Orientation Left    Pain Descriptors / Indicators Aching    Pain Type Chronic pain    Pain Onset More than a month ago    Aggravating Factors  unsure    Pain Relieving Factors heat              [facility] PT Assessment - 11/16/20 0001      Assessment   Medical Diagnosis Post Laminectomy    Referring Provider (PT) Janice Norrie, NP  [place] Adult PT Treatment/Exercise - 11/16/20 0001      Neck Exercises: Supine   Neck Retraction 5 reps;10 secs      Shoulder Exercises: Supine   Horizontal ABduction Strengthening;Both;10 reps;Theraband    Theraband Level (Shoulder Horizontal ABduction) Level 3 (Green)    Other Supine Exercises double ER with scapular retraction 2 x 10 with red theraband      Shoulder Exercises: ROM/Strengthening   UBE (Upper Arm Bike) L1 x 5 min   fwd/bwd 2:30     Shoulder Exercises: Stretch   Other Shoulder Stretches upper trap, levator scapulae stretch 2 x 30 sec    Other Shoulder Stretches rhomboid stretch seated with arms together reaching down, and crossed on bar leaning back 2 x 30 sec ea.      Manual Therapy   Manual therapy comments skilled palpation and monitoring of pt throughout TPDN    Soft tissue mobilization IASTM along L rhonboids, thoracic  multifidi, upper trap            Trigger Point Dry Needling - 11/16/20 0001    Consent Given? Yes    Education Handout Provided Previously provided    Upper Trapezius Response Twitch reponse elicited;Palpable increased muscle length   L   Erector spinae Response Twitch response elicited;Palpable increased muscle length   T8                    PT Long Term Goals - 11/09/20 1652      PT LONG TERM GOAL #1   Title Pt will report min occasional pain in L scapular region, not affecting ADLs    Baseline today pain si a 10/10    Period Weeks    Status On-going    Target Date 12/07/20      PT LONG TERM GOAL #2   Title Pt will be able to show 4+/5 or better strength in L UE    Baseline unable to test today due to pain    Period Weeks    Status Unable to assess    Target Date 12/07/20      PT LONG TERM GOAL #3   Title Pt will be able to reach in all directions without increased pain    Baseline continues    Period Weeks    Status On-going    Target Date 12/07/20      PT LONG TERM GOAL #4   Title Pt will be I with HEP for posture, shoulder mobility, strength    Time 4    Period Weeks    Status On-going    Target Date 12/07/20                 Plan - 11/16/20 1651    Clinical Impression Statement pt arrives reporting relief since the last session but notes the pain returned last night reporting it is about 8/10. pt exhibiting no visual signs that would indicate that level of pain. Continued TPDN focusing on L thoracic paraspinals and upper trap followed with IASTM techniques. conintued working on scapular stability in supine which she responded well to.    PT Treatment/Interventions ADLs/Self Care Home Management;Cryotherapy;Therapeutic exercise;Patient/family education;Manual techniques;Passive range of motion;Dry needling;Taping;Therapeutic activities;Moist Heat;Functional mobility training;Ultrasound    PT Next Visit Plan TPDN PRN, trial theracane. continue  manual, exercise progression as tolerated, posterior shoulder strengthening, assess work related task.    PT Home Exercise Plan [id number]    Consulted and Agree with Plan of Care Patient  Patient will benefit from skilled therapeutic intervention in order to improve the following deficits and impairments:  Decreased mobility,Impaired sensation,Decreased strength,Increased fascial restricitons,Impaired flexibility,Impaired UE functional use,Postural dysfunction,Pain  Visit Diagnosis: Pain in thoracic spine  Chronic left shoulder pain  Muscle weakness (generalized)     Problem List Patient Active Problem List   Diagnosis Date Noted  . S/P insertion of spinal cord stimulator 07/01/2019  . Low back pain with left-sided sciatica   . Long term use of drug 01/24/2019  . Foot pain, bilateral 12/04/2018  . Trochanteric bursitis of left hip 08/29/2018  . Avascular necrosis of bone of hip ([place]) 08/24/2017  . Avascular necrosis of femoral head, right ([place]) 02/22/2017  . Avascular necrosis of femoral head, left ([place]) 10/24/2016  . OA (osteoarthritis) of hip 10/24/2016  . S/P lumbar laminectomy 05/27/2016  . Type 2 diabetes mellitus without complication, without long-term current use of insulin ([place]) 11/03/2015  . Multilevel degenerative disc disease 10/22/2014  . Chronic back pain 05/27/2014  . Lumbar radiculopathy, chronic 06/14/2013  . Adenomatous colon polyp 05/13/2013  . Vaginal discharge 08/22/2012  . Hypertension 07/25/2012  . Preventative health care 07/25/2012  . Hot flash, menopausal 07/25/2012  . Chronic low back pain 07/25/2012  . Fibroids 09/02/2011  . Tobacco dependence 09/02/2011  . Hyperlipidemia 08/15/2009   Starr Lake PT, DPT, LAT, ATC  11/16/20  5:18 PM      [facility] [address] [place], [place], [address] Phone: [phone]   Fax:  [phone]  Name: Roberta Clark MRN:  [id number] Date of Birth: 26-Jun-1961

## 2020-11-24 ENCOUNTER — Inpatient Hospital Stay: Admission: RE | Admit: 2020-11-24 | Payer: Medicare Other | Source: Ambulatory Visit

## 2020-12-01 ENCOUNTER — Other Ambulatory Visit: Payer: Self-pay

## 2020-12-01 ENCOUNTER — Ambulatory Visit: Payer: Medicare Other | Admitting: Physical Therapy

## 2020-12-01 ENCOUNTER — Encounter: Payer: Self-pay | Admitting: Physical Therapy

## 2020-12-01 DIAGNOSIS — M546 Pain in thoracic spine: Secondary | ICD-10-CM

## 2020-12-01 DIAGNOSIS — G8929 Other chronic pain: Secondary | ICD-10-CM

## 2020-12-01 DIAGNOSIS — M25512 Pain in left shoulder: Secondary | ICD-10-CM

## 2020-12-01 DIAGNOSIS — M6281 Muscle weakness (generalized): Secondary | ICD-10-CM

## 2020-12-01 NOTE — Therapy (Addendum)
[place], [place], [address] Phone: [phone]   Fax:  [phone]  Physical Therapy Treatment / discharge  Patient Details  Name: Roberta Clark MRN: [id number] Date of Birth: 02-Jun-1961 Referring Provider (PT): Janice Norrie, NP   Encounter Date: 12/01/2020   PT End of Session - 12/01/20 1626    Visit Number 6    Number of Visits 8    Date for PT Re-Evaluation 12/07/20    Authorization Type UHC MCR, MCD    PT Start Time 1620    PT Stop Time 1659    PT Time Calculation (min) 39 min    Activity Tolerance Patient tolerated treatment well    Behavior During Therapy [facility] for tasks assessed/performed           Past Medical History:  Diagnosis Date  . Adenomatous colon polyp 05/13/2013  . Anemia   . Arthritis   . Chronic pain   . Diabetes mellitus without complication ([place]) 7412   Type II - diet controlled  . Family history of adverse reaction to anesthesia    mother - nausea  . Hyperlipidemia 2011   Rash with statin:  not well controlled with Gemfibrozil  . Hypertension 2011  . Insomnia secondary to chronic pain   . Low back pain with left-sided sciatica    Reportedly with lumbar spinal stenosis    Past Surgical History:  Procedure Laterality Date  . BACK SURGERY    . COLONOSCOPY    . LUMBAR LAMINECTOMY/DECOMPRESSION MICRODISCECTOMY Bilateral 05/27/2016   Procedure: Left Lumbar Four-Five Laminectomy and Foraminotomy;  Surgeon: Eustace Moore, MD;  Location: [facility];  Service: Neurosurgery;  Laterality: Bilateral;  . SPINAL CORD STIMULATOR INSERTION N/A 07/01/2019   Procedure: Spinal cord stimulator via Thoracic Nine laminectomy;  Surgeon: Eustace Moore, MD;  Location: [facility];  Service: Neurosurgery;  Laterality: N/A;  Spinal cord stimulator via Thoracic Nine laminectomy  . TOTAL HIP ARTHROPLASTY Left 10/24/2016   Procedure: LEFT TOTAL HIP ARTHROPLASTY ANTERIOR APPROACH;  Surgeon: Gaynelle Arabian,  MD;  [facility];  Service: Orthopedics;  Laterality: Left;  . TOTAL HIP ARTHROPLASTY Right 02/22/2017   Procedure: RIGHT TOTAL HIP ARTHROPLASTY ANTERIOR APPROACH;  Surgeon: Gaynelle Arabian, MD;  [facility];  Service: Orthopedics;  Laterality: Right;  . TUBAL LIGATION  1985    There were no vitals filed for this visit.   Subjective Assessment - 12/01/20 1627    Subjective "I am doing pretty good. I've had some pain today. I havne't had any time to do any exercise or anthing. I did go get my stimulator adjusted to help with the pain which I was told to give it a few days."    Patient Stated Goals Pain relief    Currently in Pain? Yes    Pain Score 7     Pain Location Shoulder    Pain Orientation Left    Pain Descriptors / Indicators Aching;Sore    Pain Type Chronic pain    Pain Onset More than a month ago    Aggravating Factors  unsure    Pain Relieving Factors heat              [facility] PT Assessment - 12/01/20 0001      Assessment   Medical Diagnosis Post Laminectomy    Referring Provider (PT) Janice Norrie, NP  [facility] Adult PT Treatment/Exercise - 12/01/20 0001      Shoulder Exercises: Standing   Horizontal ABduction Strengthening;Both;12 reps;Theraband    Theraband Level (Shoulder Horizontal ABduction) Level 3 (Green)    Extension Strengthening;Both;12 reps;Theraband    Theraband Level (Shoulder Extension) Level 4 (Blue)    Row 12 reps;Strengthening;Theraband    Theraband Level (Shoulder Row) Level 3 (Green)      Shoulder Exercises: ROM/Strengthening   Nustep L5 x 5 min UE/LE      Shoulder Exercises: Stretch   Other Shoulder Stretches upper trap, levator scapulae stretch 2 x 30 sec    Other Shoulder Stretches rhomboid stretch seated with arms together reaching down, and crossed on bar leaning back 2 x 30 sec ea.      Moist Heat Therapy   Number Minutes Moist Heat 10 Minutes    Moist Heat Location Cervical   in sitting                       PT Long Term Goals - 11/09/20 1652      PT LONG TERM GOAL #1   Title Pt will report min occasional pain in L scapular region, not affecting ADLs    Baseline today pain si a 10/10    Period Weeks    Status On-going    Target Date 12/07/20      PT LONG TERM GOAL #2   Title Pt will be able to show 4+/5 or better strength in L UE    Baseline unable to test today due to pain    Period Weeks    Status Unable to assess    Target Date 12/07/20      PT LONG TERM GOAL #3   Title Pt will be able to reach in all directions without increased pain    Baseline continues    Period Weeks    Status On-going    Target Date 12/07/20      PT LONG TERM GOAL #4   Title Pt will be I with HEP for posture, shoulder mobility, strength    Time 4    Period Weeks    Status On-going    Target Date 12/07/20                 Plan - 12/01/20 1659    Clinical Impression Statement Roberta Clark arrives noting pain at 7/10. opted to hold off on TPDN and focus on strengthening today. She did very well with shoulder strengthening and reported pain dropped from a 7/10 to a 5/10. continued MHP end of session to calm down pain.    PT Treatment/Interventions ADLs/Self Care Home Management;Cryotherapy;Therapeutic exercise;Patient/family education;Manual techniques;Passive range of motion;Dry needling;Taping;Therapeutic activities;Moist Heat;Functional mobility training;Ultrasound    PT Next Visit Plan TPDN PRN, trial theracane. continue manual, exercise progression as tolerated, posterior shoulder strengthening, assess work related task.    Consulted and Agree with Plan of Care Patient           Patient will benefit from skilled therapeutic intervention in order to improve the following deficits and impairments:  Decreased mobility,Impaired sensation,Decreased strength,Increased fascial restricitons,Impaired flexibility,Impaired UE functional use,Postural  dysfunction,Pain  Visit Diagnosis: Pain in thoracic spine  Chronic left shoulder pain  Muscle weakness (generalized)     Problem List Patient Active Problem List   Diagnosis Date Noted  . S/P insertion of spinal cord stimulator 07/01/2019  . Low back pain with left-sided sciatica   . Long term use of drug  01/24/2019  . Foot pain, bilateral 12/04/2018  . Trochanteric bursitis of left hip 08/29/2018  . Avascular necrosis of bone of hip ([place]) 08/24/2017  . Avascular necrosis of femoral head, right ([place]) 02/22/2017  . Avascular necrosis of femoral head, left ([place]) 10/24/2016  . OA (osteoarthritis) of hip 10/24/2016  . S/P lumbar laminectomy 05/27/2016  . Type 2 diabetes mellitus without complication, without long-term current use of insulin ([place]) 11/03/2015  . Multilevel degenerative disc disease 10/22/2014  . Chronic back pain 05/27/2014  . Lumbar radiculopathy, chronic 06/14/2013  . Adenomatous colon polyp 05/13/2013  . Vaginal discharge 08/22/2012  . Hypertension 07/25/2012  . Preventative health care 07/25/2012  . Hot flash, menopausal 07/25/2012  . Chronic low back pain 07/25/2012  . Fibroids 09/02/2011  . Tobacco dependence 09/02/2011  . Hyperlipidemia 08/15/2009    Starr Lake PT, DPT, LAT, ATC  12/01/20  5:03 PM      [facility] [address] [place], [place], [address] Phone: [phone]   Fax:  [phone]  Name: Roberta Clark MRN: [id number] Date of Birth: 01/15/1961      PHYSICAL THERAPY DISCHARGE SUMMARY  Visits from Start of Care: 6  Current functional level related to goals / functional outcomes: See goals   Remaining deficits: Pt reports she gets temporary relief from treatment but the pain comes back. She missed last 3 scheduled appointments, current status unknown.    Education / Equipment: HEP, theraband, posture  Plan: Patient agrees to discharge.  Patient goals were not  met. Patient is being discharged due to not returning since the last visit.  ?????         Kapono Luhn PT, DPT, LAT, ATC  12/22/20  10:00 AM

## 2020-12-07 ENCOUNTER — Telehealth: Payer: Self-pay | Admitting: Physical Therapy

## 2020-12-07 ENCOUNTER — Ambulatory Visit: Payer: Medicare Other | Admitting: Physical Therapy

## 2020-12-07 NOTE — Telephone Encounter (Signed)
Spoke with pt regarding missed appointment which she noted she thought it was tomorrow. She noted she didn't get a reminder call on Friday. I reviewed when her next scheduled appointment day/ time is and if she cannot make it to call and we can cancel or reschedule that appointment for her.   Harriett Azar PT, DPT, LAT, ATC  12/07/20  5:53 PM

## 2020-12-08 ENCOUNTER — Other Ambulatory Visit: Payer: Self-pay | Admitting: Internal Medicine

## 2020-12-14 ENCOUNTER — Ambulatory Visit: Payer: Medicare Other | Attending: Student | Admitting: Physical Therapy

## 2020-12-14 ENCOUNTER — Telehealth: Payer: Self-pay | Admitting: Physical Therapy

## 2020-12-14 NOTE — Telephone Encounter (Signed)
LVM regarding missed appointment today. I reviewed her next appointment day/ time schedule and if she cannot make it to call before her scheduled appointment, and we can work to reschedule / cancel that appointment.  Delany Steury PT, DPT, LAT, ATC  12/14/20  4:53 PM

## 2020-12-16 ENCOUNTER — Encounter: Payer: Self-pay | Admitting: Internal Medicine

## 2020-12-16 ENCOUNTER — Ambulatory Visit (INDEPENDENT_AMBULATORY_CARE_PROVIDER_SITE_OTHER): Payer: Medicare Other | Admitting: Internal Medicine

## 2020-12-16 VITALS — BP 140/80 | HR 80 | Resp 20 | Ht 63.0 in | Wt 116.8 lb

## 2020-12-16 DIAGNOSIS — I10 Essential (primary) hypertension: Secondary | ICD-10-CM

## 2020-12-16 DIAGNOSIS — E119 Type 2 diabetes mellitus without complications: Secondary | ICD-10-CM | POA: Diagnosis not present

## 2020-12-16 DIAGNOSIS — M810 Age-related osteoporosis without current pathological fracture: Secondary | ICD-10-CM | POA: Diagnosis not present

## 2020-12-16 DIAGNOSIS — G5603 Carpal tunnel syndrome, bilateral upper limbs: Secondary | ICD-10-CM | POA: Diagnosis not present

## 2020-12-16 DIAGNOSIS — R143 Flatulence: Secondary | ICD-10-CM | POA: Diagnosis not present

## 2020-12-16 DIAGNOSIS — D171 Benign lipomatous neoplasm of skin and subcutaneous tissue of trunk: Secondary | ICD-10-CM

## 2020-12-16 MED ORDER — WRIST SPLINT/COCK-UP/LEFT SM MISC: 0 refills | Status: DC

## 2020-12-16 MED ORDER — LISINOPRIL 10 MG PO TABS: ORAL_TABLET | ORAL | 11 refills | Status: DC

## 2020-12-16 MED ORDER — GLIPIZIDE ER 5 MG PO TB24: 5.0000 mg | ORAL_TABLET | Freq: Every day | ORAL | 11 refills | Status: DC

## 2020-12-16 MED ORDER — WRIST SPLINT/COCK-UP/RIGHT SM MISC: 0 refills | Status: DC

## 2020-12-16 NOTE — Progress Notes (Signed)
  Subjective:    Patient ID: Roberta Clark, female   DOB: 07/06/1961, 60 y.o.   MRN: [id number]   HPI   1.  Gas:  Passing gas all the time.  Sometimes, has a small accident with the gas.  No bloating or abdominal discomfort, melena or hematochezia.   She did start chewing a lot of gum with trying to stop smoking--[employer] regular sugar.  Chews a lot.  Started since beginning of year with gum  2.  Osteoporosis:  Taking Alendronate.  Aching in arms to hands since started, but then shares her ortho surgery, Dr. Jones plans a NCS to evaluate for Carpal Tunnel Syndrome.  Problem has been a problem for past month.  No increased repetitive movement of wrists in past month.   Occurs mainly with sleep. She is not using cock up splints.  3.  DM:  Sugars checked twice daily.  Generally in low 100s.  A1C in Feb was 7.1.  Very active.  Only taking her Glipizide once daily in the morning--regular release.  Not taking 1/2 tab twice daily as written.  4.  Hypertension:  Having more back pain again.  They are adjusting her nerve stimulator, but no improvement yet.  Taking her Lisinopril/HCTZ daily.  5.  Lipoma on left shoulder:  Not bigger, but concerned causing discomfort in hands and forearms.    Current Meds  Medication Sig   alendronate (FOSAMAX) 70 MG tablet Take 1 tablet (70 mg total) by mouth every 7 (seven) days. Take with a full glass of water on an empty stomach.   amitriptyline (ELAVIL) 25 MG tablet TAKE ONE TABLET BY MOUTH AT BEDTIME AS NEEDED SLEEP   Blood Glucose Monitoring Suppl (ONETOUCH VERIO) w/Device KIT Check blood glucose twice daily before meals   FLUoxetine (PROZAC) 40 MG capsule TAKE ONE CAPSULE BY MOUTH EVERY DAY   gabapentin (NEURONTIN) 600 MG tablet TAKE ONE TABLET BY MOUTH EVERY MORNING AND AT MIDDAY,AND THEN TAKE TWO TABLETS BY MOUTH AT BEDTIME] (Patient taking differently: 1 tab by mouth each morning and 2 tabs in afternoon and at bedtime)   glipiZIDE (GLUCOTROL) 5 MG tablet  TAKE ONE-HALF TABLET BY MOUTH TWICE DAILY WITH MEALS (Patient taking differently: Taking 1 tab by mouth each morning)   iron polysaccharides (NIFEREX) 150 MG capsule Take 1 capsule (150 mg total) by mouth daily.   Lancets (ONETOUCH DELICA PLUS LANCET33G) MISC USE   TO CHECK GLUCOSE TWICE DAILY BEFORE MEAL(S)   lisinopril-hydrochlorothiazide (ZESTORETIC) 20-12.5 MG tablet TAKE ONE TABLET BY MOUTH ONCE DAILY   Melatonin 3 MG SUBL Place 6 mg under the tongue at bedtime.   meloxicam (MOBIC) 7.5 MG tablet TAKE 1 TO 2 TABLETS BY MOUTH ONCE DAILY   methocarbamol (ROBAXIN) 500 MG tablet Take 500 mg by mouth every 8 (eight) hours as needed for muscle spasms.    Naproxen Sod-diphenhydrAMINE (ALEVE PM) 220-25 MG TABS Take 2 tablets by mouth at bedtime.   Omega-3 Fatty Acids (FISH OIL) 1000 MG CAPS Take 1,000 mg by mouth 2 (two) times daily.    ONETOUCH VERIO test strip USE  STRIP TO CHECK GLUCOSE TWICE DAILY BEFORE MEAL(S)   potassium chloride (KLOR-CON) 10 MEQ tablet Take 1 tablet (10 mEq total) by mouth daily.   rosuvastatin (CRESTOR) 20 MG tablet TAKE ONE TABLET BY MOUTH EVERY DAY with evening meal   Allergies  Allergen Reactions   Atorvastatin Rash   Metformin And Related Nausea And Vomiting     Review of   Systems    Objective:   BP 140/80 (BP Location: Left Arm, Patient Position: Sitting, Cuff Size: Normal)   Pulse 80   Resp 20   Ht 5' 3" (1.6 m)   Wt 116 lb 12 oz (53 kg)   BMI 20.68 kg/m   Physical Exam NAD Lungs:  CTA CV:  RRR without murmur or rub.  Radial pulses normal and equal Abd:  S, NT, No HSM or mass, + BS Neuro:  volar wrists over median nerve:  + tinels and phalens bilaterally Skin:  Soft well circumscribed 2 cm tumor with extension to a 1 cm similarly feeling mass under skin on top of left shoulder/end of clavicle.  NT.  Assessment & Plan    Flatus:  encouraged decreasing amount of chewing gum use to see if this decreases.  2.   Osteoporosis:  continue Alendronate.   Call if discomfort continues but feel more related to #3  3.  Bilateral carpal tunnel syndrome:  encouraged nightly use of cock up splints and avoiding repetitive movements of wrists.  4.  DM:  switch to extended length Glipizide for ease of use and appropriate dosing.  5.  Hypertension:  Add Lisinopril 10 mg daily to regimen that includes Lisinopril/HCTZ.  BP check in 1 month  6.  Left shoulder lipoma:  unchanged.  Discussed unlikely cause of her arm/wrist pain- CTS as above.    

## 2020-12-21 ENCOUNTER — Telehealth: Payer: Self-pay | Admitting: Physical Therapy

## 2020-12-21 ENCOUNTER — Ambulatory Visit: Payer: Medicare Other | Admitting: Physical Therapy

## 2020-12-22 NOTE — Telephone Encounter (Signed)
Spoke with pt regarding missed appointment on 5/9 at 4:30. I noted today is the 3rd missed appointment which is grounds for discharge.She stated she feels like she isn't making any progress with physical therapy and plans to contact her MD. I discussed benefit of continued exercise to maintain the strengthening. I stated that I will go ahead and discharge her today, which she agreed.  Canda Podgorski PT, DPT, LAT, ATC  12/22/20  9:57 AM

## 2021-01-05 ENCOUNTER — Other Ambulatory Visit: Payer: Self-pay | Admitting: Internal Medicine

## 2021-01-14 ENCOUNTER — Other Ambulatory Visit: Payer: Self-pay

## 2021-01-14 ENCOUNTER — Ambulatory Visit
Admission: RE | Admit: 2021-01-14 | Discharge: 2021-01-14 | Disposition: A | Discharge status: Home / Self Care | Payer: Medicare Other | Source: Ambulatory Visit | Attending: Internal Medicine | Admitting: Internal Medicine

## 2021-01-14 DIAGNOSIS — Z1231 Encounter for screening mammogram for malignant neoplasm of breast: Secondary | ICD-10-CM

## 2021-01-25 ENCOUNTER — Encounter: Payer: Self-pay | Admitting: Internal Medicine

## 2021-01-25 ENCOUNTER — Other Ambulatory Visit: Payer: Self-pay

## 2021-01-25 ENCOUNTER — Other Ambulatory Visit (INDEPENDENT_AMBULATORY_CARE_PROVIDER_SITE_OTHER): Payer: Medicare Other | Admitting: Internal Medicine

## 2021-01-25 VITALS — BP 134/82

## 2021-01-25 DIAGNOSIS — I10 Essential (primary) hypertension: Secondary | ICD-10-CM | POA: Diagnosis not present

## 2021-01-25 NOTE — Progress Notes (Signed)
Here for BP check as borderline at visit in May.  Was having more pain then. Discussed fine today.  No change to meds

## 2021-03-22 DIAGNOSIS — M25552 Pain in left hip: Secondary | ICD-10-CM | POA: Insufficient documentation

## 2021-03-25 ENCOUNTER — Ambulatory Visit (INDEPENDENT_AMBULATORY_CARE_PROVIDER_SITE_OTHER): Payer: Medicare Other | Admitting: Sports Medicine

## 2021-03-25 ENCOUNTER — Other Ambulatory Visit: Payer: Self-pay

## 2021-03-25 ENCOUNTER — Ambulatory Visit (INDEPENDENT_AMBULATORY_CARE_PROVIDER_SITE_OTHER): Payer: Medicare Other

## 2021-03-25 ENCOUNTER — Encounter: Payer: Self-pay | Admitting: Sports Medicine

## 2021-03-25 ENCOUNTER — Other Ambulatory Visit: Payer: Self-pay | Admitting: Sports Medicine

## 2021-03-25 DIAGNOSIS — M779 Enthesopathy, unspecified: Secondary | ICD-10-CM

## 2021-03-25 DIAGNOSIS — M21611 Bunion of right foot: Secondary | ICD-10-CM

## 2021-03-25 DIAGNOSIS — M79672 Pain in left foot: Secondary | ICD-10-CM

## 2021-03-25 DIAGNOSIS — M21612 Bunion of left foot: Secondary | ICD-10-CM

## 2021-03-25 DIAGNOSIS — M21619 Bunion of unspecified foot: Secondary | ICD-10-CM | POA: Diagnosis not present

## 2021-03-25 DIAGNOSIS — M79671 Pain in right foot: Secondary | ICD-10-CM

## 2021-03-25 DIAGNOSIS — E119 Type 2 diabetes mellitus without complications: Secondary | ICD-10-CM

## 2021-03-25 MED ORDER — DICLOFENAC SODIUM 1 % EX GEL: 4.0000 g | Freq: Four times a day (QID) | CUTANEOUS | 1 refills | Status: DC

## 2021-03-25 MED ORDER — DICLOFENAC SODIUM 75 MG PO TBEC: 75.0000 mg | DELAYED_RELEASE_TABLET | Freq: Two times a day (BID) | ORAL | 0 refills | Status: DC

## 2021-03-25 NOTE — Progress Notes (Addendum)
Subjective: Roberta Clark is a 60 y.o. female patient who returns to office for follow up evaluation of bilateral foot pain. Patient reports that her bunions are sore again but the left one hurts worse and came to discuss surgery. Patient denies any other pedal complaints or changes with medical history since last visit.  Diabetic type II FBS not recorded, last A1c 7.1  Patient Active Problem List   Diagnosis Date Noted   Pain of left hip joint 03/22/2021   S/P insertion of spinal cord stimulator 07/01/2019   Low back pain with left-sided sciatica    Long term use of drug 01/24/2019   Foot pain, bilateral 12/04/2018   Trochanteric bursitis of left hip 08/29/2018   Avascular necrosis of bone of hip ([place]) 08/24/2017   Avascular necrosis of femoral head, right ([place]) 02/22/2017   Avascular necrosis of femoral head, left ([place]) 10/24/2016   OA (osteoarthritis) of hip 10/24/2016   S/P lumbar laminectomy 05/27/2016   Type 2 diabetes mellitus without complication, without long-term current use of insulin ([place]) 11/03/2015   Multilevel degenerative disc disease 10/22/2014   Chronic back pain 05/27/2014   Lumbar radiculopathy, chronic 06/14/2013   Adenomatous colon polyp 05/13/2013   Vaginal discharge 08/22/2012   Hypertension 07/25/2012   Preventative health care 07/25/2012   Hot flash, menopausal 07/25/2012   Chronic low back pain 07/25/2012   Fibroids 09/02/2011   Tobacco dependence 09/02/2011   Hyperlipidemia 08/15/2009    Current Outpatient Medications on File Prior to Visit  Medication Sig Dispense Refill   alendronate (FOSAMAX) 70 MG tablet Take 1 tablet (70 mg total) by mouth every 7 (seven) days. Take with a full glass of water on an empty stomach. 4 tablet 11   amitriptyline (ELAVIL) 25 MG tablet TAKE ONE TABLET BY MOUTH AT BEDTIME AS NEEDED SLEEP 30 tablet 10   Blood Glucose Monitoring Suppl (ONETOUCH VERIO) w/Device KIT Check blood glucose twice daily before meals 1 kit 0    Elastic Bandages & Supports (WRIST SPLINT/COCK-UP/LEFT SM) MISC Wear at bedtime nightly 1 each 0   Elastic Bandages & Supports (WRIST SPLINT/COCK-UP/RIGHT SM) MISC Wear at bedtime nightly 1 each 0   FLUoxetine (PROZAC) 40 MG capsule TAKE ONE CAPSULE BY MOUTH EVERY DAY 30 capsule 10   gabapentin (NEURONTIN) 600 MG tablet TAKE ONE TABLET BY MOUTH EVERY MORNING AND AT MIDDAY,AND THEN TAKE TWO TABLETS BY MOUTH AT BEDTIME] 120 tablet 11   glipiZIDE (GLUCOTROL XL) 5 MG 24 hr tablet Take 1 tablet (5 mg total) by mouth daily with breakfast. 30 tablet 11   iron polysaccharides (NIFEREX) 150 MG capsule Take 1 capsule (150 mg total) by mouth daily. 21 capsule 0   Lancets (ONETOUCH DELICA PLUS [id number]) MISC USE   TO CHECK GLUCOSE TWICE DAILY BEFORE MEAL(S) 100 each 11   lisinopril (ZESTRIL) 10 MG tablet 1 tab by mouth daily with Lisinopril/HCTZ combination 30 tablet 11   lisinopril-hydrochlorothiazide (ZESTORETIC) 20-12.5 MG tablet TAKE ONE TABLET BY MOUTH ONCE DAILY 30 tablet 8   Melatonin 3 MG SUBL Place 6 mg under the tongue at bedtime.     meloxicam (MOBIC) 7.5 MG tablet TAKE 1 TO 2 TABLETS BY MOUTH ONCE DAILY 60 tablet 6   methocarbamol (ROBAXIN) 500 MG tablet Take 500 mg by mouth every 8 (eight) hours as needed for muscle spasms.   0   Naproxen Sod-diphenhydrAMINE (ALEVE PM) 220-25 MG TABS Take 2 tablets by mouth at bedtime.     Omega-3 Fatty Acids (FISH  OIL) 1000 MG CAPS Take 1,000 mg by mouth 2 (two) times daily.      ONETOUCH VERIO test strip USE  STRIP TO CHECK GLUCOSE TWICE DAILY BEFORE MEAL(S) 100 each 11   potassium chloride (KLOR-CON) 10 MEQ tablet Take 1 tablet (10 mEq total) by mouth daily. 30 tablet 10   rosuvastatin (CRESTOR) 20 MG tablet TAKE ONE TABLET BY MOUTH EVERY DAY with evening meal 90 tablet 3   tiZANidine (ZANAFLEX) 4 MG tablet tizanidine 4 mg tablet  TAKE ONE TABLET by oral route THREE times every DAY AS NEEDED     No current facility-administered medications on file prior to  visit.    Allergies  Allergen Reactions   Atorvastatin Rash   Metformin And Related Nausea And Vomiting   Past Surgical History:  Procedure Laterality Date   BACK SURGERY     COLONOSCOPY     LUMBAR LAMINECTOMY/DECOMPRESSION MICRODISCECTOMY Bilateral 05/27/2016   Procedure: Left Lumbar Four-Five Laminectomy and Foraminotomy;  Surgeon: Eustace Moore, MD;  Location: [facility];  Service: Neurosurgery;  Laterality: Bilateral;   SPINAL CORD STIMULATOR INSERTION N/A 07/01/2019   Procedure: Spinal cord stimulator via Thoracic Nine laminectomy;  Surgeon: Eustace Moore, MD;  Location: [facility];  Service: Neurosurgery;  Laterality: N/A;  Spinal cord stimulator via Thoracic Nine laminectomy   TOTAL HIP ARTHROPLASTY Left 10/24/2016   Procedure: LEFT TOTAL HIP ARTHROPLASTY ANTERIOR APPROACH;  Surgeon: Gaynelle Arabian, MD;  [facility];  Service: Orthopedics;  Laterality: Left;   TOTAL HIP ARTHROPLASTY Right 02/22/2017   Procedure: RIGHT TOTAL HIP ARTHROPLASTY ANTERIOR APPROACH;  Surgeon: Gaynelle Arabian, MD;  [facility];  Service: Orthopedics;  Laterality: Right;   TUBAL LIGATION  1985    Family History  Problem Relation Age of Onset   Heart disease Mother 28       Hx of MI    Hypertension Mother    Diabetes Mother    Diabetes Sister    [name]' disease Sister    Lupus Father    Graves' disease Brother    Diabetes Brother    Other Brother        COVID   Arthritis Daughter        Not clear if RA   Hypertension Daughter    Migraines Daughter    Epilepsy Son        Started 70 months of age after bacterial meningitis   GER disease Sister     Social History   Socioeconomic History   Marital status: Significant Other    Spouse name: [name]   Number of children: 2   Years of education: 14   Highest education level: Associate degree: occupational, Hotel manager, or vocational program  Occupational History   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   Children live in town and are supportive.   Originally from [place].   Went to [employer] [place].   Social Determinants of Health   Financial Resource Strain: Not on file  Food Insecurity: No Food Insecurity   Worried About Charity fundraiser in the Last Year: Never true   Ran Out of Food in the Last Year: Never true  Transportation Needs: No Transportation Needs   Lack of Transportation (Medical): No   Lack of Transportation (Non-Medical): No  Physical Activity: Not on file  Stress: Not on file  Social Connections: Not on file     Objective:  General: Alert and oriented x3 in no acute distress  Dermatology: No open lesions bilateral lower extremities, no webspace macerations, no ecchymosis bilateral, all nails x 10 are well manicured with mild distal thickening otherwise no acute findings.  Vascular: Dorsalis Pedis and Posterior Tibial pedal pulses palpable, Capillary Fill Time 3 seconds,(+) pedal hair growth bilateral, no edema bilateral lower extremities, Temperature gradient within normal limits.  Neurology: Gross sensation intact via light touch bilateral, protective sensation intact bilateral.  Musculoskeletal: Mild to moderate tenderness with palpation at bunions L>R foot, limited 1st MTPJ ROM 30 df and 10 pf, No 1st ray hypermobility, + Pes planus. Strength within normal limits in all groups bilateral.   Xrays IM angle increased  supportive of bunion with bone spur at 1st MTPJ on left>right, midtarsal breach supportive of pes planus.   Assessment and Plan: Problem List Items Addressed This Visit       Endocrine   Type 2 diabetes mellitus without complication, without long-term current use of insulin ([place])   Other Visit Diagnoses     Bilateral foot pain    -  Primary   Bunion       Capsulitis          -Complete examination performed -Xrays reviewed -Discussed treatement options for capsulitis at bunions bilateral left>right -Patient wants to wait on getting surgery until the winter break from school; patient is a Systems analyst -Rx Diclofenac 20m PO to replace mobic -Rx Voltaren topical to use as directed  -Recommend good supportive shoes daily for foot type -Patient to return to office when ready for surgery or sooner if problems or issues arise.  TLandis Martins DPM

## 2021-03-30 ENCOUNTER — Telehealth: Payer: Self-pay | Admitting: *Deleted

## 2021-03-30 NOTE — Telephone Encounter (Signed)
Patient is calling for the status of 2 medications that was supposed to be sent to pharmacy 1 week ago.  Returned tha call to patient(no answer, could not leave vmessage) after calling pharmacy to inform patient that pharmacy's driver was out sick and could not delliver but will send out today.  She called for a second time

## 2021-03-30 NOTE — Telephone Encounter (Signed)
Patient is calling for the status of 2 medications that was supposed to be sent to pharmacy 1 week ago.  Returned tha call to patient(no answer, could not leave vmessage) after calling pharmacy to inform patient that pharmacy's driver was out sick and could not delliver but will send out today.

## 2021-04-10 ENCOUNTER — Other Ambulatory Visit: Payer: Self-pay | Admitting: Internal Medicine

## 2021-04-11 NOTE — Telephone Encounter (Signed)
Please call patient and find out why she has not been taking Lisinopril since end of June (last filled 01/12/2021)

## 2021-04-12 NOTE — Telephone Encounter (Signed)
Pt reported that she has been taking all her medication as prescribed.  She is unsure why it was reported that she only filled in June. Patient gets all her medications delivered to her, so that could be the issue.

## 2021-04-12 NOTE — Telephone Encounter (Signed)
Pt did not answer. Voicemail left to call back

## 2021-04-20 ENCOUNTER — Other Ambulatory Visit: Payer: Self-pay | Admitting: Sports Medicine

## 2021-04-20 ENCOUNTER — Other Ambulatory Visit: Payer: Self-pay | Admitting: Internal Medicine

## 2021-04-20 NOTE — Telephone Encounter (Signed)
Please advise 

## 2021-04-29 ENCOUNTER — Other Ambulatory Visit: Payer: Self-pay | Admitting: Internal Medicine

## 2021-05-11 ENCOUNTER — Telehealth: Payer: Self-pay | Admitting: *Deleted

## 2021-05-11 ENCOUNTER — Other Ambulatory Visit: Payer: Self-pay | Admitting: Sports Medicine

## 2021-05-11 MED ORDER — DICLOFENAC SODIUM 75 MG PO TBEC: 75.0000 mg | DELAYED_RELEASE_TABLET | Freq: Two times a day (BID) | ORAL | 2 refills | Status: DC

## 2021-05-11 NOTE — Telephone Encounter (Signed)
Patient is calling for a refill on Diclofenac Sodium-75 mg.Please advise. She is also wanting to let the doctor know that the medication and cream is working.

## 2021-05-11 NOTE — Progress Notes (Signed)
Refill diclofenac

## 2021-05-11 NOTE — Telephone Encounter (Signed)
Called and informed that medication has been approved and sent to pharmacy on file.

## 2021-05-25 ENCOUNTER — Other Ambulatory Visit: Payer: Self-pay | Admitting: Sports Medicine

## 2021-05-28 NOTE — Telephone Encounter (Signed)
Please advise 

## 2021-06-15 DIAGNOSIS — M810 Age-related osteoporosis without current pathological fracture: Secondary | ICD-10-CM | POA: Insufficient documentation

## 2021-06-21 ENCOUNTER — Other Ambulatory Visit: Payer: Medicare Other | Admitting: Internal Medicine

## 2021-06-22 ENCOUNTER — Other Ambulatory Visit (INDEPENDENT_AMBULATORY_CARE_PROVIDER_SITE_OTHER): Payer: Medicare Other

## 2021-06-22 DIAGNOSIS — E782 Mixed hyperlipidemia: Secondary | ICD-10-CM | POA: Diagnosis not present

## 2021-06-22 DIAGNOSIS — E119 Type 2 diabetes mellitus without complications: Secondary | ICD-10-CM | POA: Diagnosis not present

## 2021-06-22 DIAGNOSIS — Z79899 Other long term (current) drug therapy: Secondary | ICD-10-CM

## 2021-06-23 ENCOUNTER — Other Ambulatory Visit: Payer: Self-pay | Admitting: Internal Medicine

## 2021-06-23 ENCOUNTER — Encounter: Payer: Medicare Other | Admitting: Internal Medicine

## 2021-06-23 DIAGNOSIS — F32A Depression, unspecified: Secondary | ICD-10-CM

## 2021-06-23 DIAGNOSIS — F419 Anxiety disorder, unspecified: Secondary | ICD-10-CM

## 2021-06-23 LAB — CBC WITH DIFFERENTIAL/PLATELET
Basophils Absolute: 0.1 10*3/uL (ref 0.0–0.2)
Basos: 1 %
EOS (ABSOLUTE): 0.6 10*3/uL — ABNORMAL HIGH (ref 0.0–0.4)
Eos: 8 %
Hematocrit: 39.4 % (ref 34.0–46.6)
Hemoglobin: 13.5 g/dL (ref 11.1–15.9)
Immature Grans (Abs): 0 10*3/uL (ref 0.0–0.1)
Immature Granulocytes: 0 %
Lymphocytes Absolute: 4 10*3/uL — ABNORMAL HIGH (ref 0.7–3.1)
Lymphs: 56 %
MCH: 31 pg (ref 26.6–33.0)
MCHC: 34.3 g/dL (ref 31.5–35.7)
MCV: 90 fL (ref 79–97)
Monocytes Absolute: 0.4 10*3/uL (ref 0.1–0.9)
Monocytes: 6 %
Neutrophils Absolute: 2.1 10*3/uL (ref 1.4–7.0)
Neutrophils: 29 %
Platelets: 329 10*3/uL (ref 150–450)
RBC: 4.36 x10E6/uL (ref 3.77–5.28)
RDW: 12.7 % (ref 11.7–15.4)
WBC: 7.2 10*3/uL (ref 3.4–10.8)

## 2021-06-23 LAB — LIPID PANEL W/O CHOL/HDL RATIO
Cholesterol, Total: 187 mg/dL (ref 100–199)
HDL: 37 mg/dL — ABNORMAL LOW (ref >39)
LDL Chol Calc (NIH): 120 mg/dL — ABNORMAL HIGH (ref 0–99)
Triglycerides: 167 mg/dL — ABNORMAL HIGH (ref 0–149)
VLDL Cholesterol Cal: 30 mg/dL (ref 5–40)

## 2021-06-23 LAB — COMPREHENSIVE METABOLIC PANEL
ALT: 97 IU/L — ABNORMAL HIGH (ref 0–32)
AST: 54 IU/L — ABNORMAL HIGH (ref 0–40)
Albumin/Globulin Ratio: 2 (ref 1.2–2.2)
Albumin: 4.7 g/dL (ref 3.8–4.8)
Alkaline Phosphatase: 71 IU/L (ref 44–121)
BUN/Creatinine Ratio: 22 (ref 12–28)
BUN: 18 mg/dL (ref 8–27)
Bilirubin Total: 0.3 mg/dL (ref 0.0–1.2)
CO2: 23 mmol/L (ref 20–29)
Calcium: 9.8 mg/dL (ref 8.7–10.3)
Chloride: 104 mmol/L (ref 96–106)
Creatinine, Ser: 0.82 mg/dL (ref 0.57–1.00)
Globulin, Total: 2.4 g/dL (ref 1.5–4.5)
Glucose: 97 mg/dL (ref 70–99)
Potassium: 4.2 mmol/L (ref 3.5–5.2)
Sodium: 142 mmol/L (ref 134–144)
Total Protein: 7.1 g/dL (ref 6.0–8.5)
eGFR: 81 mL/min/{1.73_m2} (ref >59)

## 2021-06-23 LAB — HEMOGLOBIN A1C
Est. average glucose Bld gHb Est-mCnc: 137 mg/dL
Hgb A1c MFr Bld: 6.4 % — ABNORMAL HIGH (ref 4.8–5.6)

## 2021-06-23 LAB — MICROALBUMIN / CREATININE URINE RATIO
Creatinine, Urine: 55.2 mg/dL
Microalb/Creat Ratio: <5 mg/g creat (ref 0–29)
Microalbumin, Urine: <3 ug/mL

## 2021-06-28 ENCOUNTER — Telehealth: Payer: Self-pay

## 2021-06-28 NOTE — Telephone Encounter (Signed)
Pt called to report that she feels something enlarged in her throat/ esophagus. Notice it about a year ago for the first time. Prevents from eating and drinking fluids due to pain from swallowing. Has lost weight recently from not being able to eat. Also hurts around upper back. Believes it continues to grow. Not taking any medication for this issue. Plans to start drinking ensure since she is eating less

## 2021-06-30 NOTE — Telephone Encounter (Signed)
Pt scheduled 07/12/21

## 2021-07-12 ENCOUNTER — Ambulatory Visit (INDEPENDENT_AMBULATORY_CARE_PROVIDER_SITE_OTHER): Payer: Medicare Other | Admitting: Internal Medicine

## 2021-07-12 ENCOUNTER — Encounter: Payer: Self-pay | Admitting: Internal Medicine

## 2021-07-12 ENCOUNTER — Other Ambulatory Visit: Payer: Self-pay

## 2021-07-12 VITALS — BP 120/74 | HR 80 | Resp 20 | Ht 63.0 in | Wt 106.0 lb

## 2021-07-12 DIAGNOSIS — R1319 Other dysphagia: Secondary | ICD-10-CM | POA: Diagnosis not present

## 2021-07-12 DIAGNOSIS — Z23 Encounter for immunization: Secondary | ICD-10-CM

## 2021-07-12 MED ORDER — OMEPRAZOLE 20 MG PO CPDR: DELAYED_RELEASE_CAPSULE | ORAL | 2 refills | Status: DC

## 2021-07-12 NOTE — Progress Notes (Signed)
Subjective:    Patient ID: Roberta Clark, female   DOB: April 07, 1961, 60 y.o.   MRN: [id number]   HPI  Dysphagia:  Doesn't have any outward enlargement, feels like she has something stuck in her chest, both anterior and posterior.  Started about 1 month ago and has progressively worsened.      Has sense something is stopping medication, liquids, or solids from going down--both anterior and posterior chest.  Will have pain for a bit as it is going down, then releases.    Is taking Alendronate--describes taking it properly on empty stomach with water and sitting up for 2 hours afterward.  Started mid Sept, just a while before symptoms started.  Also taking Diclofenac twice daily and Aleve pm 2 at bedtime.  She cannot give up Diclofenac with her ortho pain issues.  Willing to give up Aleve.  No melena or hematochezia.  No epigastric pain.  Current Meds  Medication Sig   alendronate (FOSAMAX) 70 MG tablet Take 1 tablet (70 mg total) by mouth every 7 (seven) days. Take with a full glass of water on an empty stomach.   amitriptyline (ELAVIL) 25 MG tablet TAKE ONE TABLET BY MOUTH AT BEDTIME AS NEEDED SLEEP   Blood Glucose Monitoring Suppl (ONETOUCH VERIO) w/Device KIT Check blood glucose twice daily before meals   diclofenac (VOLTAREN) 75 MG EC tablet Take 1 tablet (75 mg total) by mouth 2 (two) times daily.   diclofenac Sodium (VOLTAREN) 1 % GEL Apply 4 g topically 4 (four) times daily.   Elastic Bandages & Supports (WRIST SPLINT/COCK-UP/LEFT SM) MISC Wear at bedtime nightly   Elastic Bandages & Supports (WRIST SPLINT/COCK-UP/RIGHT SM) MISC Wear at bedtime nightly   FLUoxetine (PROZAC) 40 MG capsule TAKE ONE CAPSULE BY MOUTH EVERY DAY   gabapentin (NEURONTIN) 600 MG tablet TAKE ONE TABLET BY MOUTH EVERY MORNING AND AT MIDDAY,AND THEN TAKE TWO TABLETS BY MOUTH AT BEDTIME]   glipiZIDE (GLUCOTROL XL) 5 MG 24 hr tablet Take 1 tablet (5 mg total) by mouth daily with breakfast.   iron  polysaccharides (NIFEREX) 150 MG capsule Take 1 capsule (150 mg total) by mouth daily.   Lancets (ONETOUCH DELICA PLUS [id number]) MISC USE   TO CHECK GLUCOSE TWICE DAILY BEFORE MEAL(S)   lisinopril (ZESTRIL) 10 MG tablet 1 tab by mouth daily with Lisinopril/HCTZ combination   lisinopril-hydrochlorothiazide (ZESTORETIC) 20-12.5 MG tablet TAKE ONE TABLET BY MOUTH ONCE DAILY   Melatonin 3 MG SUBL Place 6 mg under the tongue at bedtime.   methocarbamol (ROBAXIN) 500 MG tablet Take 500 mg by mouth every 8 (eight) hours as needed for muscle spasms.    Naproxen Sod-diphenhydrAMINE (ALEVE PM) 220-25 MG TABS Take 2 tablets by mouth at bedtime.   Omega-3 Fatty Acids (FISH OIL) 1000 MG CAPS Take 1,000 mg by mouth 2 (two) times daily.    ONETOUCH VERIO test strip USE  STRIP TO CHECK GLUCOSE TWICE DAILY BEFORE MEAL(S)   potassium chloride (KLOR-CON) 10 MEQ tablet Take 1 tablet (10 mEq total) by mouth daily.   rosuvastatin (CRESTOR) 20 MG tablet TAKE ONE TABLET BY MOUTH EVERY DAY with evening meal   Allergies  Allergen Reactions   Atorvastatin Rash   Metformin And Related Nausea And Vomiting     Review of Systems    Objective:   BP 120/74 (BP Location: Left Arm, Patient Position: Sitting, Cuff Size: Normal)   Pulse 80   Resp 20   Ht 5' 3" (1.6 m)  Wt 106 lb (48.1 kg)   BMI 18.78 kg/m   Physical Exam NAD HEENT:  PERRL, EOMI, throat without injetion Neck:  Supple, No adenopathy, not thyromegaly Chest:  CTA CV:  RRR without murmur or rub.  Radial and DP pulses normal and equal. Abd:  S, NT, No HSM or mass, + BS LE:  No edema.   Assessment & Plan    Dysphagia and likely esophageal spasm:  Hold Alendronate.  Will likely need to look at IV infusion for osteoporosis treatment in future.   Omeprazole 20 mg daily Elevate HOB No lying down for 2 hours after any oral intake. No Aleve. Follow up in 2 months Call if no improvement in 2 weeks.    2.  HM:  influenza and Pfizer bivalent  vaccines.  Will give shingrix and Td in January when follows up.  She states had Shingrix #1 at [facility] previously and will bring documentation in Jan.

## 2021-07-21 ENCOUNTER — Other Ambulatory Visit: Payer: Self-pay | Admitting: Sports Medicine

## 2021-07-21 ENCOUNTER — Other Ambulatory Visit: Payer: Self-pay | Admitting: Internal Medicine

## 2021-08-11 ENCOUNTER — Other Ambulatory Visit (HOSPITAL_COMMUNITY): Payer: Self-pay | Admitting: Student

## 2021-08-11 ENCOUNTER — Other Ambulatory Visit: Payer: Self-pay | Admitting: Student

## 2021-08-11 DIAGNOSIS — M5416 Radiculopathy, lumbar region: Secondary | ICD-10-CM

## 2021-08-15 HISTORY — PX: BUNIONECTOMY: SHX129

## 2021-08-20 ENCOUNTER — Ambulatory Visit (HOSPITAL_COMMUNITY)
Admission: RE | Admit: 2021-08-20 | Discharge: 2021-08-20 | Disposition: A | Discharge status: Home / Self Care | Payer: 59 | Source: Ambulatory Visit | Attending: Student | Admitting: Student

## 2021-08-20 ENCOUNTER — Encounter (HOSPITAL_COMMUNITY): Payer: Self-pay

## 2021-08-20 ENCOUNTER — Other Ambulatory Visit: Payer: Self-pay

## 2021-08-20 DIAGNOSIS — M5416 Radiculopathy, lumbar region: Secondary | ICD-10-CM

## 2021-08-31 ENCOUNTER — Other Ambulatory Visit: Payer: Self-pay

## 2021-08-31 ENCOUNTER — Ambulatory Visit (INDEPENDENT_AMBULATORY_CARE_PROVIDER_SITE_OTHER): Payer: 59 | Admitting: Internal Medicine

## 2021-08-31 ENCOUNTER — Encounter: Payer: Self-pay | Admitting: Internal Medicine

## 2021-08-31 VITALS — BP 117/70 | HR 80 | Resp 16 | Ht 63.0 in | Wt 109.0 lb

## 2021-08-31 DIAGNOSIS — M810 Age-related osteoporosis without current pathological fracture: Secondary | ICD-10-CM | POA: Diagnosis not present

## 2021-08-31 DIAGNOSIS — R8290 Unspecified abnormal findings in urine: Secondary | ICD-10-CM | POA: Diagnosis not present

## 2021-08-31 LAB — POCT WET PREP WITH KOH
KOH Prep POC: NEGATIVE
RBC Wet Prep HPF POC: NEGATIVE
Trichomonas, UA: NEGATIVE
Yeast Wet Prep HPF POC: NEGATIVE

## 2021-08-31 MED ORDER — METRONIDAZOLE 500 MG PO TABS: ORAL_TABLET | ORAL | 0 refills | Status: DC

## 2021-08-31 NOTE — Patient Instructions (Signed)
Let me know when you get your teeth squared away and will get you scheduled for yearly infusion of Zoledronic Acid 5 mg IV

## 2021-08-31 NOTE — Progress Notes (Addendum)
Subjective:    Patient ID: Roberta Clark, female   DOB: 03/21/1961, 61 y.o.   MRN: [id number]   HPI  Dysphagia:  resolved with stopping of oral bisphosphonates and initiation of omeprazole.  Resolved after a few days.  Took Fosamax for bout 2.5 months prior.  She does have dental issues she is planning to address and we have not checked a Vitamin D level recently.  Calcium levels have been stable in the normal range for years.   Discussed once yearly Zoledronic Acid IV 5 mg or possibly other bisphosphonate.    Odor with urination.  No dysuria.  She does not feel she has vaginal discharge.  She already urinated and cannot give [place] another sample today  Current Meds  Medication Sig   amitriptyline (ELAVIL) 25 MG tablet TAKE ONE TABLET BY MOUTH AT BEDTIME AS NEEDED SLEEP   Blood Glucose Monitoring Suppl (ONETOUCH VERIO) w/Device KIT Check blood glucose twice daily before meals   diclofenac (VOLTAREN) 75 MG EC tablet Take 1 tablet (75 mg total) by mouth 2 (two) times daily.   diclofenac Sodium (VOLTAREN) 1 % GEL Apply 4 g topically 4 (four) times daily.   Elastic Bandages & Supports (WRIST SPLINT/COCK-UP/LEFT SM) MISC Wear at bedtime nightly   Elastic Bandages & Supports (WRIST SPLINT/COCK-UP/RIGHT SM) MISC Wear at bedtime nightly   FLUoxetine (PROZAC) 40 MG capsule TAKE ONE CAPSULE BY MOUTH EVERY DAY   gabapentin (NEURONTIN) 600 MG tablet TAKE ONE TABLET BY MOUTH EVERY MORNING AND AT MIDDAY,AND THEN TAKE TWO TABLETS BY MOUTH AT BEDTIME]   glipiZIDE (GLUCOTROL XL) 5 MG 24 hr tablet Take 1 tablet (5 mg total) by mouth daily with breakfast.   Lancets (ONETOUCH DELICA PLUS [id number]) MISC USE   TO CHECK GLUCOSE TWICE DAILY BEFORE MEAL(S)   lisinopril (ZESTRIL) 10 MG tablet 1 tab by mouth daily with Lisinopril/HCTZ combination   lisinopril-hydrochlorothiazide (ZESTORETIC) 20-12.5 MG tablet TAKE ONE TABLET BY MOUTH ONCE DAILY   Melatonin 3 MG SUBL Place 6 mg under the tongue at bedtime.    methocarbamol (ROBAXIN) 500 MG tablet Take 500 mg by mouth every 8 (eight) hours as needed for muscle spasms.    Naproxen Sod-diphenhydrAMINE (ALEVE PM) 220-25 MG TABS Take 2 tablets by mouth at bedtime.   Omega-3 Fatty Acids (FISH OIL) 1000 MG CAPS Take 1,000 mg by mouth 2 (two) times daily.    omeprazole (PRILOSEC) 20 MG capsule 1 cap by mouth on empty stomach only with water daily 1 h prior to other meds or meal   ONETOUCH VERIO test strip USE  STRIP TO CHECK GLUCOSE TWICE DAILY BEFORE MEAL(S)   potassium chloride (KLOR-CON) 10 MEQ tablet TAKE ONE TABLET BY MOUTH EVERY DAY   rosuvastatin (CRESTOR) 20 MG tablet TAKE ONE TABLET BY MOUTH EVERY DAY with evening meal   Allergies  Allergen Reactions   Atorvastatin Rash   Metformin And Related Nausea And Vomiting     Review of Systems    Objective:   BP 117/70 (BP Location: Right Arm, Patient Position: Sitting, Cuff Size: Normal)   Pulse 80   Resp 16   Ht '5\' 3"'  (1.6 m)   Wt 109 lb (49.4 kg)   BMI 19.31 kg/m   Physical Exam NAD Abd:  S, + BS, NT  Wet prep with + clue cells and whiff Assessment & Plan    Osteoporosis or vertebra:  Did not tolerate oral Fosamax.  Checking Vitamin D level.  She will get  her dental issues addressed, then will get her set up with yearly IV infusion of zoledronic acid 5 mg.    2.  BV:  metronidazole 500 mg twice daily for 7 days.  No alcohol.

## 2021-09-01 LAB — VITAMIN D 25 HYDROXY (VIT D DEFICIENCY, FRACTURES): Vit D, 25-Hydroxy: 50.8 ng/mL (ref 30.0–100.0)

## 2021-09-03 ENCOUNTER — Ambulatory Visit (HOSPITAL_COMMUNITY): Admission: RE | Admit: 2021-09-03 | Payer: 59 | Source: Ambulatory Visit

## 2021-09-06 ENCOUNTER — Other Ambulatory Visit: Payer: Self-pay | Admitting: Internal Medicine

## 2021-09-06 ENCOUNTER — Other Ambulatory Visit: Payer: Self-pay | Admitting: Student

## 2021-09-06 DIAGNOSIS — M5416 Radiculopathy, lumbar region: Secondary | ICD-10-CM

## 2021-09-08 ENCOUNTER — Ambulatory Visit: Payer: 59 | Admitting: Physical Therapy

## 2021-09-08 NOTE — Therapy (Incomplete)
OUTPATIENT PHYSICAL THERAPY LOWER EXTREMITY EVALUATION   Patient Name: Roberta Clark MRN: [id number] DOB:November 18, 1960, 61 y.o., female Today's Date: 09/08/2021    Past Medical History:  Diagnosis Date   Adenomatous colon polyp 05/13/2013   Anemia    Arthritis    Chronic pain    Diabetes mellitus without complication ([place]) 2297   Type II - diet controlled   Family history of adverse reaction to anesthesia    mother - nausea   Hyperlipidemia 2011   Rash with statin:  not well controlled with Gemfibrozil   Hypertension 2011   Insomnia secondary to chronic pain    Low back pain with left-sided sciatica    Reportedly with lumbar spinal stenosis   Past Surgical History:  Procedure Laterality Date   BACK SURGERY     COLONOSCOPY     LUMBAR LAMINECTOMY/DECOMPRESSION MICRODISCECTOMY Bilateral 05/27/2016   Procedure: Left Lumbar Four-Five Laminectomy and Foraminotomy;  Surgeon: Eustace Moore, MD;  Location: [place];  Service: Neurosurgery;  Laterality: Bilateral;   SPINAL CORD STIMULATOR INSERTION N/A 07/01/2019   Procedure: Spinal cord stimulator via Thoracic Nine laminectomy;  Surgeon: Eustace Moore, MD;  Location: [place];  Service: Neurosurgery;  Laterality: N/A;  Spinal cord stimulator via Thoracic Nine laminectomy   TOTAL HIP ARTHROPLASTY Left 10/24/2016   Procedure: LEFT TOTAL HIP ARTHROPLASTY ANTERIOR APPROACH;  Surgeon: Gaynelle Arabian, MD;  [facility];  Service: Orthopedics;  Laterality: Left;   TOTAL HIP ARTHROPLASTY Right 02/22/2017   Procedure: RIGHT TOTAL HIP ARTHROPLASTY ANTERIOR APPROACH;  Surgeon: Gaynelle Arabian, MD;  Location[facility];  Service: Orthopedics;  Laterality: Right;   TUBAL LIGATION  1985   Patient Active Problem List   Diagnosis Date Noted   Osteoporosis without current pathological fracture 06/15/2021   Pain of left hip joint 03/22/2021   S/P insertion of spinal cord stimulator 07/01/2019   Low back pain with left-sided sciatica    Long term use of drug  01/24/2019   Foot pain, bilateral 12/04/2018   Trochanteric bursitis of left hip 08/29/2018   Avascular necrosis of bone of hip ([place]) 08/24/2017   Avascular necrosis of femoral head, right ([place]) 02/22/2017   Avascular necrosis of femoral head, left ([place]) 10/24/2016   OA (osteoarthritis) of hip 10/24/2016   S/P lumbar laminectomy 05/27/2016   Type 2 diabetes mellitus without complication, without long-term current use of insulin ([place]) 11/03/2015   Multilevel degenerative disc disease 10/22/2014   Chronic back pain 05/27/2014   Lumbar radiculopathy, chronic 06/14/2013   Adenomatous colon polyp 05/13/2013   Vaginal discharge 08/22/2012   Primary hypertension 07/25/2012   Preventative health care 07/25/2012   Hot flash, menopausal 07/25/2012   Chronic low back pain 07/25/2012   Fibroids 09/02/2011   Tobacco dependence 09/02/2011   Mixed hyperlipidemia 08/15/2009    PCP: Mack Hook, MD  REFERRING PROVIDER: Gerrit Halls, PA-C  REFERRING DIAG: Presence of left artificial hip joint [Z96.642]  THERAPY DIAG:  No diagnosis found.  ONSET DATE: ***  SUBJECTIVE:   SUBJECTIVE STATEMENT: ***  PERTINENT HISTORY: ***  PAIN:  Are you having pain? {yes/no:20286} NPRS scale: ***/10 Pain location: *** Pain orientation: {Pain Orientation:25161}  PAIN TYPE: {type:313116} Pain description: {PAIN DESCRIPTION:21022940}  Aggravating factors: *** Relieving factors: ***  PRECAUTIONS: {Therapy precautions:24002}  WEIGHT BEARING RESTRICTIONS {Yes ***/No:24003}  FALLS:  Has patient fallen in last 6 months? {yes/no:20286}, Number of falls: ***  LIVING ENVIRONMENT: Lives with: {[facility] lives with:25569::"lives with their family"} Lives in: {Lives in:[address]70} Stairs: {yes/no:20286}; {  Stairs:24000} Has following equipment at home: {Assistive devices:23999}  OCCUPATION: ***  PLOF: {PLOF:24004}  PATIENT GOALS ***   OBJECTIVE:   DIAGNOSTIC FINDINGS: ***  PATIENT SURVEYS:   FOTO ***  COGNITION:  Overall cognitive status: {cognition:24006}     SENSATION:  Light touch: {intact/deficits:24005}    MUSCLE LENGTH: Hamstrings: Right *** deg; Left *** deg Thomas test: Right *** deg; Left *** deg  POSTURE:  ***  PALPATION: ***  LE AROM/PROM:  A/PROM Right 09/08/2021 Left 09/08/2021  Hip flexion    Hip extension    Hip abduction    Hip adduction    Hip internal rotation    Hip external rotation    Knee flexion    Knee extension    Ankle dorsiflexion    Ankle plantarflexion    Ankle inversion    Ankle eversion     (Blank rows = not tested)  LE MMT:  MMT Right 09/08/2021 Left 09/08/2021  Hip flexion    Hip extension    Hip abduction    Hip adduction    Hip internal rotation    Hip external rotation    Knee flexion    Knee extension    Ankle dorsiflexion    Ankle plantarflexion    Ankle inversion    Ankle eversion     (Blank rows = not tested)  LOWER EXTREMITY SPECIAL TESTS:  {LEspecialtests:26242}  FUNCTIONAL TESTS:  {Functional tests:24029}  GAIT: Distance walked: *** Assistive device utilized: {Assistive devices:23999} Level of assistance: {Levels of assistance:24026} Comments: ***    TODAY'S TREATMENT: [facility] Adult PT Treatment:                                                DATE: 09/08/2021  Therapeutic Exercise: *** Manual Therapy: *** Neuromuscular re-ed: *** Therapeutic Activity: *** Modalities: *** Self Care: ***    PATIENT EDUCATION:  Education details: Evaluation findings, POC, goals, HEP with proper. Person educated: {Person educated:25204} Education method: {Education Method:25205} Education comprehension: {Education Comprehension:25206}   HOME EXERCISE PROGRAM: ***  ASSESSMENT:  CLINICAL IMPRESSION: Patient is a 61 y.o. F who was seen today for physical therapy evaluation and treatment for ***. Objective impairments include {opptimpairments:25111}. These impairments are limiting patient  from {activity limitations:25113}. Personal factors including {Personal factors:25162} are also affecting patient's functional outcome. Patient will benefit from skilled PT to address above impairments and improve overall function.  REHAB POTENTIAL: {rehabpotential:25112}  CLINICAL DECISION MAKING: {clinical decision making:25114}  EVALUATION COMPLEXITY: {Evaluation complexity:25115}   GOALS: Goals reviewed with patient? {yes/no:20286}  SHORT TERM GOALS:  STG Name Target Date Goal status  1 Pt to be IND with initial HEP for therapeutic progression Baseline:  {follow up:25551} {GOALSTATUS:25110}  2 *** Baseline:  {follow up:25551} {GOALSTATUS:25110}  3 *** Baseline: {follow up:25551} {GOALSTATUS:25110}   LONG TERM GOALS:   LTG Name Target Date Goal status  1 *** Baseline: {follow up:25551} {GOALSTATUS:25110}  2 *** Baseline: {follow up:25551} {GOALSTATUS:25110}  3 *** Baseline: {follow up:25551} {GOALSTATUS:25110}  4 *** Baseline: {follow up:25551} {GOALSTATUS:25110}  5 *** Baseline: {follow up:25551} {GOALSTATUS:25110}  6 *** Baseline: {follow up:25551} {GOALSTATUS:25110}  7 *** Baseline: {follow up:25551} {GOALSTATUS:25110}   PLAN: PT FREQUENCY: {rehab frequency:25116}  PT DURATION: {rehab duration:25117}  PLANNED INTERVENTIONS: {rehab planned interventions:25118::"Therapeutic exercises","Therapeutic activity","Neuro Muscular re-education","Balance training","Gait training","Patient/Family education","Joint mobilization"}  PLAN FOR NEXT SESSION: Review/ update HEP PRN.***   Haru Shaff  Aliveah Gallant PT, DPT, LAT, ATC  09/08/21  8:29 AM

## 2021-09-09 ENCOUNTER — Ambulatory Visit: Payer: 59 | Attending: Physician Assistant

## 2021-09-09 ENCOUNTER — Telehealth: Payer: Self-pay | Admitting: *Deleted

## 2021-09-09 ENCOUNTER — Other Ambulatory Visit: Payer: Self-pay

## 2021-09-09 DIAGNOSIS — M6281 Muscle weakness (generalized): Secondary | ICD-10-CM | POA: Insufficient documentation

## 2021-09-09 DIAGNOSIS — M25552 Pain in left hip: Secondary | ICD-10-CM | POA: Insufficient documentation

## 2021-09-09 NOTE — Telephone Encounter (Signed)
Patient is calling and wanting to speak with the doctor, did not say why. I tried to call back, no answer, left vmessage for call back with her questions.

## 2021-09-09 NOTE — Therapy (Deleted)
OUTPATIENT PHYSICAL THERAPY LOWER EXTREMITY EVALUATION   Patient Name: Roberta Clark MRN: [id number] DOB:12-Jul-1961, 61 y.o., female Today's Date: 09/09/2021    Past Medical History:  Diagnosis Date   Adenomatous colon polyp 05/13/2013   Anemia    Arthritis    Chronic pain    Diabetes mellitus without complication ([place]) 2536   Type II - diet controlled   Family history of adverse reaction to anesthesia    mother - nausea   Hyperlipidemia 2011   Rash with statin:  not well controlled with Gemfibrozil   Hypertension 2011   Insomnia secondary to chronic pain    Low back pain with left-sided sciatica    Reportedly with lumbar spinal stenosis   Past Surgical History:  Procedure Laterality Date   BACK SURGERY     COLONOSCOPY     LUMBAR LAMINECTOMY/DECOMPRESSION MICRODISCECTOMY Bilateral 05/27/2016   Procedure: Left Lumbar Four-Five Laminectomy and Foraminotomy;  Surgeon: Eustace Moore, MD;  Location: [place];  Service: Neurosurgery;  Laterality: Bilateral;   SPINAL CORD STIMULATOR INSERTION N/A 07/01/2019   Procedure: Spinal cord stimulator via Thoracic Nine laminectomy;  Surgeon: Eustace Moore, MD;  Location: [place];  Service: Neurosurgery;  Laterality: N/A;  Spinal cord stimulator via Thoracic Nine laminectomy   TOTAL HIP ARTHROPLASTY Left 10/24/2016   Procedure: LEFT TOTAL HIP ARTHROPLASTY ANTERIOR APPROACH;  Surgeon: Gaynelle Arabian, MD;  [facility];  Service: Orthopedics;  Laterality: Left;   TOTAL HIP ARTHROPLASTY Right 02/22/2017   Procedure: RIGHT TOTAL HIP ARTHROPLASTY ANTERIOR APPROACH;  Surgeon: Gaynelle Arabian, MD;  [facility];  Service: Orthopedics;  Laterality: Right;   TUBAL LIGATION  1985   Patient Active Problem List   Diagnosis Date Noted   Osteoporosis without current pathological fracture 06/15/2021   Pain of left hip joint 03/22/2021   S/P insertion of spinal cord stimulator 07/01/2019   Low back pain with left-sided sciatica    Long term use of drug  01/24/2019   Foot pain, bilateral 12/04/2018   Trochanteric bursitis of left hip 08/29/2018   Avascular necrosis of bone of hip ([place]) 08/24/2017   Avascular necrosis of femoral head, right ([place]) 02/22/2017   Avascular necrosis of femoral head, left ([place]) 10/24/2016   OA (osteoarthritis) of hip 10/24/2016   S/P lumbar laminectomy 05/27/2016   Type 2 diabetes mellitus without complication, without long-term current use of insulin ([place]) 11/03/2015   Multilevel degenerative disc disease 10/22/2014   Chronic back pain 05/27/2014   Lumbar radiculopathy, chronic 06/14/2013   Adenomatous colon polyp 05/13/2013   Vaginal discharge 08/22/2012   Primary hypertension 07/25/2012   Preventative health care 07/25/2012   Hot flash, menopausal 07/25/2012   Chronic low back pain 07/25/2012   Fibroids 09/02/2011   Tobacco dependence 09/02/2011   Mixed hyperlipidemia 08/15/2009    PCP: Mack Hook, MD  REFERRING PROVIDER: Florinda Marker  REFERRING DIAG: U44.034 (ICD-10-CM) - Presence of left artificial hip joint   THERAPY DIAG:  Muscle weakness (generalized)  Pain in left hip  ONSET DATE: 10/24/2016  SUBJECTIVE:   SUBJECTIVE STATEMENT: ***  PERTINENT HISTORY:  LUMBAR LAMINECTOMY/DECOMPRESSION MICRODISCECTOMY Bilateral 05/27/2016   Procedure: Left Lumbar Four-Five Laminectomy and Foraminotomy;  Surgeon: Eustace Moore, MD;  Location: [place];  Service: Neurosurgery;  Laterality: Bilateral;  SPINAL CORD STIMULATOR INSERTION N/A 07/01/2019   Procedure: Spinal cord stimulator via Thoracic Nine laminectomy;  Surgeon: Eustace Moore, MD;  Location: [place];  Service: Neurosurgery;  Laterality: N/A;  Spinal cord stimulator  via Thoracic Nine laminectomy  TOTAL HIP ARTHROPLASTY Left 10/24/2016   Procedure: LEFT TOTAL HIP ARTHROPLASTY ANTERIOR APPROACH;  Surgeon: Gaynelle Arabian, MD;  [facility];  Service: Orthopedics;  Laterality: Left;  TOTAL HIP  ARTHROPLASTY Right 02/22/2017   Procedure: RIGHT TOTAL HIP ARTHROPLASTY ANTERIOR APPROACH;  Surgeon: Gaynelle Arabian, MD;  [facility];  Service: Orthopedics;  Laterality: Right;  PAIN:  Are you having pain? {yes/no:20286} NPRS scale: ***/10 Pain location: *** Pain orientation: {Pain Orientation:25161}  PAIN TYPE: {type:313116} Pain description: {PAIN DESCRIPTION:21022940}  Aggravating factors: *** Relieving factors: ***  PRECAUTIONS: Anterior hip  WEIGHT BEARING RESTRICTIONS No  FALLS:  Has patient fallen in last 6 months? {yes/no:20286}, Number of falls: ***  LIVING ENVIRONMENT: Lives with: {[facility] lives with:25569::"lives with their family"} Lives in: {Lives in:[address]} Stairs: {yes/no:20286}; {Stairs:24000} Has following equipment at home: {Assistive devices:23999}  OCCUPATION: ***  PLOF: Independent  PATIENT GOALS ***   OBJECTIVE:   DIAGNOSTIC FINDINGS: none found  PATIENT SURVEYS:  {rehab surveys:24030}  COGNITION:  Overall cognitive status: {cognition:24006}     SENSATION:  Light touch: {intact/deficits:24005}  Stereognosis: {intact/deficits:24005}  Hot/Cold: {intact/deficits:24005}  Proprioception: {intact/deficits:24005}  MUSCLE LENGTH: Hamstrings: Right *** deg; Left *** deg Thomas test: Right *** deg; Left *** deg  POSTURE:  ***  PALPATION: ***  LE AROM/PROM:  A/PROM Right 09/09/2021 Left 09/09/2021  Hip flexion    Hip extension    Hip abduction    Hip adduction    Hip internal rotation    Hip external rotation    Knee flexion    Knee extension    Ankle dorsiflexion    Ankle plantarflexion    Ankle inversion    Ankle eversion     (Blank rows = not tested)  LE MMT:  MMT Right 09/09/2021 Left 09/09/2021  Hip flexion    Hip extension    Hip abduction    Hip adduction    Hip internal rotation    Hip external rotation    Knee flexion    Knee extension    Ankle dorsiflexion    Ankle plantarflexion    Ankle inversion     Ankle eversion     (Blank rows = not tested)  LOWER EXTREMITY SPECIAL TESTS:  {LEspecialtests:26242}  FUNCTIONAL TESTS:  {Functional tests:24029}  GAIT: Distance walked: *** Assistive device utilized: {Assistive devices:23999} Level of assistance: {Levels of assistance:24026} Comments: ***    TODAY'S TREATMENT: ***   PATIENT EDUCATION:  Education details: *** Person educated: {Person educated:25204} Education method: {Education Method:25205} Education comprehension: {Education Comprehension:25206}   HOME EXERCISE PROGRAM: ***  ASSESSMENT:  CLINICAL IMPRESSION: Patient is a *** y.o. *** who was seen today for physical therapy evaluation and treatment for ***. Objective impairments include {opptimpairments:25111}. These impairments are limiting patient from {activity limitations:25113}. Personal factors including {Personal factors:25162} are also affecting patient's functional outcome. Patient will benefit from skilled PT to address above impairments and improve overall function.  REHAB POTENTIAL: {rehabpotential:25112}  CLINICAL DECISION MAKING: {clinical decision making:25114}  EVALUATION COMPLEXITY: {Evaluation complexity:25115}   GOALS: Goals reviewed with patient? {yes/no:20286}  SHORT TERM GOALS:  STG Name Target Date Goal status  1 *** Baseline:  {follow up:25551} {GOALSTATUS:25110}  2 *** Baseline:  {follow up:25551} {GOALSTATUS:25110}  3 *** Baseline: {follow up:25551} {GOALSTATUS:25110}  4 *** Baseline: {follow up:25551} {GOALSTATUS:25110}  5 *** Baseline: {follow up:25551} {GOALSTATUS:25110}  6 *** Baseline: {follow up:25551} {GOALSTATUS:25110}  7 *** Baseline: {follow up:25551} {GOALSTATUS:25110}   LONG TERM GOALS:   LTG Name Target Date Goal  status  1 *** Baseline: {follow up:25551} {GOALSTATUS:25110}  2 *** Baseline: {follow up:25551} {GOALSTATUS:25110}  3 *** Baseline: {follow up:25551} {GOALSTATUS:25110}  4 *** Baseline:  {follow up:25551} {GOALSTATUS:25110}  5 *** Baseline: {follow up:25551} {GOALSTATUS:25110}  6 *** Baseline: {follow up:25551} {GOALSTATUS:25110}  7 *** Baseline: {follow up:25551} {GOALSTATUS:25110}   PLAN: PT FREQUENCY: {rehab frequency:25116}  PT DURATION: {rehab duration:25117}  PLANNED INTERVENTIONS: {rehab planned interventions:25118::"Therapeutic exercises","Therapeutic activity","Neuro Muscular re-education","Balance training","Gait training","Patient/Family education","Joint mobilization"}  PLAN FOR NEXT SESSION: ***   Lanice Shirts, PT 09/09/2021, 3:56 PM

## 2021-09-10 ENCOUNTER — Ambulatory Visit
Admission: RE | Admit: 2021-09-10 | Discharge: 2021-09-10 | Disposition: A | Discharge status: Home / Self Care | Payer: 59 | Source: Ambulatory Visit | Attending: Student | Admitting: Student

## 2021-09-10 DIAGNOSIS — M5416 Radiculopathy, lumbar region: Secondary | ICD-10-CM

## 2021-09-10 MED ORDER — MEPERIDINE HCL 50 MG/ML IJ SOLN
50.0000 mg | Freq: Once | INTRAMUSCULAR | Status: AC | PRN
  Administered 2021-09-10: 50 mg via INTRAMUSCULAR

## 2021-09-10 MED ORDER — IOPAMIDOL (ISOVUE-M 200) INJECTION 41%
15.0000 mL | Freq: Once | INTRAMUSCULAR | Status: AC
  Administered 2021-09-10: 15 mL via INTRATHECAL

## 2021-09-10 MED ORDER — DIAZEPAM 5 MG PO TABS
10.0000 mg | ORAL_TABLET | Freq: Once | ORAL | Status: AC
  Administered 2021-09-10: 10 mg via ORAL

## 2021-09-10 MED ORDER — ONDANSETRON HCL 4 MG/2ML IJ SOLN
4.0000 mg | Freq: Once | INTRAMUSCULAR | Status: AC | PRN
  Administered 2021-09-10: 4 mg via INTRAMUSCULAR

## 2021-09-10 NOTE — Discharge Instr - Other Orders (Signed)
1355: pt c/o pain 8/10 from myelogram procedure.  1411: pt reports pain is starting to trend down. Pt reports pain 5/10.

## 2021-09-10 NOTE — Discharge Instructions (Signed)

## 2021-09-14 ENCOUNTER — Other Ambulatory Visit: Payer: Self-pay | Admitting: Internal Medicine

## 2021-09-16 NOTE — Telephone Encounter (Signed)
Patient is calling to let the physician know that she is ready to discuss bunion surgery, toe covers are worn out.Please call to schedule upcoming appointment.

## 2021-09-17 NOTE — Telephone Encounter (Signed)
She has been scheduled for an appointment

## 2021-09-20 NOTE — Therapy (Signed)
[place], [place], [address] Phone: [phone]   Fax:  [phone]  Physical Therapy Evaluation  Patient Details  Name: Roberta Clark MRN: [id number] Date of Birth: [birth year] Referring Provider (PT): Janice Norrie, NP   Encounter Date: 09/09/2021   PT End of Session - 09/20/21 1157     Visit Number 0    Number of Visits 0             Past Medical History:  Diagnosis Date   Adenomatous colon polyp 05/13/2013   Anemia    Arthritis    Chronic pain    Diabetes mellitus without complication ([place]) 1660   Type II - diet controlled   Family history of adverse reaction to anesthesia    mother - nausea   Hyperlipidemia 2011   Rash with statin:  not well controlled with Gemfibrozil   Hypertension 2011   Insomnia secondary to chronic pain    Low back pain with left-sided sciatica    Reportedly with lumbar spinal stenosis    Past Surgical History:  Procedure Laterality Date   BACK SURGERY     COLONOSCOPY     LUMBAR LAMINECTOMY/DECOMPRESSION MICRODISCECTOMY Bilateral 05/27/2016   Procedure: Left Lumbar Four-Five Laminectomy and Foraminotomy;  Surgeon: Eustace Moore, MD;  Location: [facility];  Service: Neurosurgery;  Laterality: Bilateral;   SPINAL CORD STIMULATOR INSERTION N/A 07/01/2019   Procedure: Spinal cord stimulator via Thoracic Nine laminectomy;  Surgeon: Eustace Moore, MD;  Location: [facility];  Service: Neurosurgery;  Laterality: N/A;  Spinal cord stimulator via Thoracic Nine laminectomy   TOTAL HIP ARTHROPLASTY Left 10/24/2016   Procedure: LEFT TOTAL HIP ARTHROPLASTY ANTERIOR APPROACH;  Surgeon: Gaynelle Arabian, MD;  Location: [facility];  Service: Orthopedics;  Laterality: Left;   TOTAL HIP ARTHROPLASTY Right 02/22/2017   Procedure: RIGHT TOTAL HIP ARTHROPLASTY ANTERIOR APPROACH;  Surgeon: Gaynelle Arabian, MD;  Location: [facility];  Service: Orthopedics;  Laterality: Right;   TUBAL LIGATION  1985     There were no vitals filed for this visit.  Visit not generated in error, patient cancelled her appointment due to illness, no charges filed  Visit Diagnosis: Muscle weakness (generalized)  Pain in left hip     Problem List Patient Active Problem List   Diagnosis Date Noted   Osteoporosis without current pathological fracture 06/15/2021   Pain of left hip joint 03/22/2021   S/P insertion of spinal cord stimulator 07/01/2019   Low back pain with left-sided sciatica    Long term use of drug 01/24/2019   Foot pain, bilateral 12/04/2018   Trochanteric bursitis of left hip 08/29/2018   Avascular necrosis of bone of hip ([place]) 08/24/2017   Avascular necrosis of femoral head, right ([place]) 02/22/2017   Avascular necrosis of femoral head, left ([place]) 10/24/2016   OA (osteoarthritis) of hip 10/24/2016   S/P lumbar laminectomy 05/27/2016   Type 2 diabetes mellitus without complication, without long-term current use of insulin ([place]) 11/03/2015   Multilevel degenerative disc disease 10/22/2014   Chronic back pain 05/27/2014   Lumbar radiculopathy, chronic 06/14/2013   Adenomatous colon polyp 05/13/2013   Vaginal discharge 08/22/2012   Primary hypertension 07/25/2012   Preventative health care 07/25/2012   Hot flash, menopausal 07/25/2012   Chronic low back pain 07/25/2012   Fibroids 09/02/2011   Tobacco dependence 09/02/2011   Mixed hyperlipidemia 08/15/2009    Lanice Shirts, PT 09/20/2021, [facility] [phone]  [place], [place], [address] Phone: [phone]   Fax:  [phone]  Name: Glenyce Randle MRN: [id number] Date of Birth: April 01, 1961

## 2021-09-29 ENCOUNTER — Encounter: Payer: 59 | Admitting: Internal Medicine

## 2021-10-01 ENCOUNTER — Encounter: Payer: 59 | Admitting: Internal Medicine

## 2021-10-04 ENCOUNTER — Other Ambulatory Visit: Payer: Self-pay | Admitting: Sports Medicine

## 2021-10-04 NOTE — Telephone Encounter (Signed)
Patient is calling for a foot cream medication,still having a lot of pain in foot. Please advise.

## 2021-10-05 ENCOUNTER — Telehealth: Payer: Self-pay | Admitting: Sports Medicine

## 2021-10-05 ENCOUNTER — Other Ambulatory Visit: Payer: Self-pay | Admitting: Sports Medicine

## 2021-10-05 MED ORDER — DICLOFENAC SODIUM 1 % EX GEL: CUTANEOUS | 1 refills | Status: DC

## 2021-10-05 NOTE — Telephone Encounter (Signed)
Pharmacy : [facility]   Medication diclofenac Sodium (VOLTAREN) 1 % GEL   Patient called requesting a refill on this medication  , please advise

## 2021-10-05 NOTE — Progress Notes (Signed)
Refill topical diclofenac

## 2021-10-07 ENCOUNTER — Ambulatory Visit: Payer: 59 | Admitting: Sports Medicine

## 2021-10-11 ENCOUNTER — Ambulatory Visit (INDEPENDENT_AMBULATORY_CARE_PROVIDER_SITE_OTHER): Payer: 59 | Admitting: Podiatry

## 2021-10-11 ENCOUNTER — Other Ambulatory Visit: Payer: Self-pay | Admitting: *Deleted

## 2021-10-11 ENCOUNTER — Encounter: Payer: Self-pay | Admitting: Podiatry

## 2021-10-11 ENCOUNTER — Other Ambulatory Visit: Payer: Self-pay | Admitting: Sports Medicine

## 2021-10-11 ENCOUNTER — Other Ambulatory Visit: Payer: Self-pay

## 2021-10-11 ENCOUNTER — Other Ambulatory Visit: Payer: Self-pay | Admitting: Internal Medicine

## 2021-10-11 DIAGNOSIS — E119 Type 2 diabetes mellitus without complications: Secondary | ICD-10-CM | POA: Diagnosis not present

## 2021-10-11 DIAGNOSIS — R202 Paresthesia of skin: Secondary | ICD-10-CM | POA: Insufficient documentation

## 2021-10-11 DIAGNOSIS — G5600 Carpal tunnel syndrome, unspecified upper limb: Secondary | ICD-10-CM | POA: Insufficient documentation

## 2021-10-11 DIAGNOSIS — M21619 Bunion of unspecified foot: Secondary | ICD-10-CM

## 2021-10-11 NOTE — Telephone Encounter (Signed)
Please advise 

## 2021-10-12 NOTE — Progress Notes (Signed)
Subjective:   Patient ID: Roberta Clark, female   DOB: 61 y.o.   MRN: [id number]   HPI Patient would like to have surgical intervention in the summer stating that her right bunion bothers her more than the left but they both hurt and she knows she needs to get them both corrected.  States she is tried wider shoes she is tried soaks and other modalities neuro   ROS      Objective:  Physical Exam  Vascular status intact exquisite discomfort in the first metatarsal head bilateral with large prominences around the first metatarsal head bilateral mild reduction of motion of the joint surfaces     Assessment:  Structural HAV deformity bilateral with pain     Plan:  H&P reviewed condition x-rays and I do think correction is necessary for this given the long-term history patient wants surgery but wants to wait till May and I encouraged her to continue wider shoes and soaks till then and she will look at her schedule schedule her procedure and reappoint for me to consult.  I do think we can do both feet over the summer

## 2021-10-13 ENCOUNTER — Telehealth: Payer: Self-pay

## 2021-10-13 NOTE — Telephone Encounter (Signed)
Patient would like Dr Amil Amen to call Dr Nyoka Cowden at [phone] to discuss widow that he has to finish dental work, before patient starts IV for osteoporosis.   ?

## 2021-10-18 NOTE — Telephone Encounter (Signed)
Please confirm that Dr. Nyoka Cowden was informed I will wait until she gets whatever dental work she needs done before setting her up.  Can Dr. Nyoka Cowden notify us when dental procedures are complete?  How long is the estimated time to get things completed from a dental standpoint? ?

## 2021-10-22 ENCOUNTER — Other Ambulatory Visit: Payer: Self-pay | Admitting: Internal Medicine

## 2021-11-05 IMAGING — MG DIGITAL SCREENING BILAT W/ TOMO W/ CAD
8 series · 8 of 24 positions shown · non-contrast
Comparison: Previous exam(s).

CLINICAL DATA: Screening.

EXAM:
DIGITAL SCREENING BILATERAL MAMMOGRAM WITH TOMO AND CAD

[R MLO synth-2D]
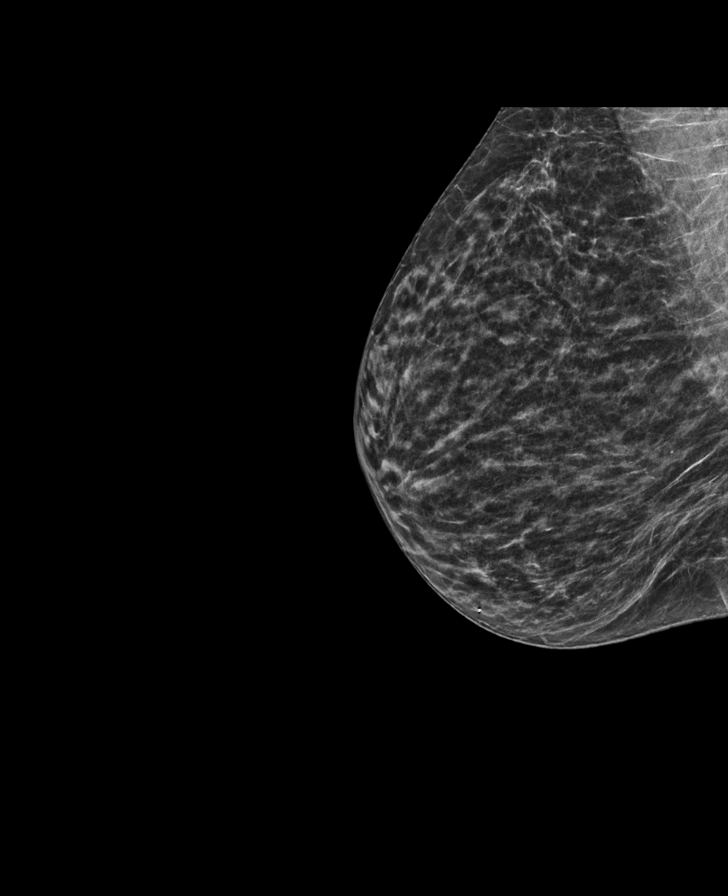

[L MLO synth-2D]
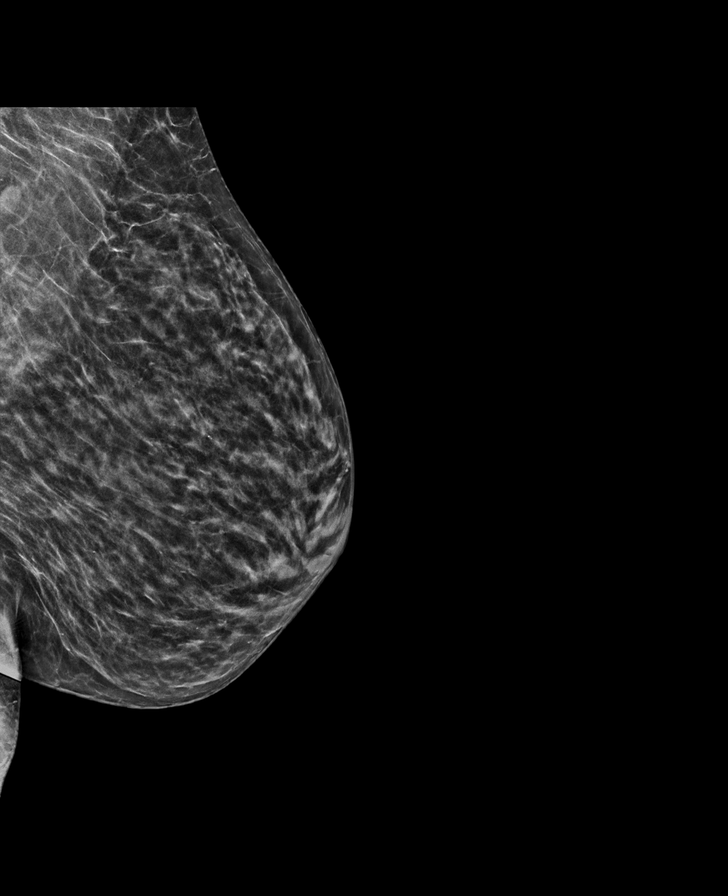

[R CC synth-2D]
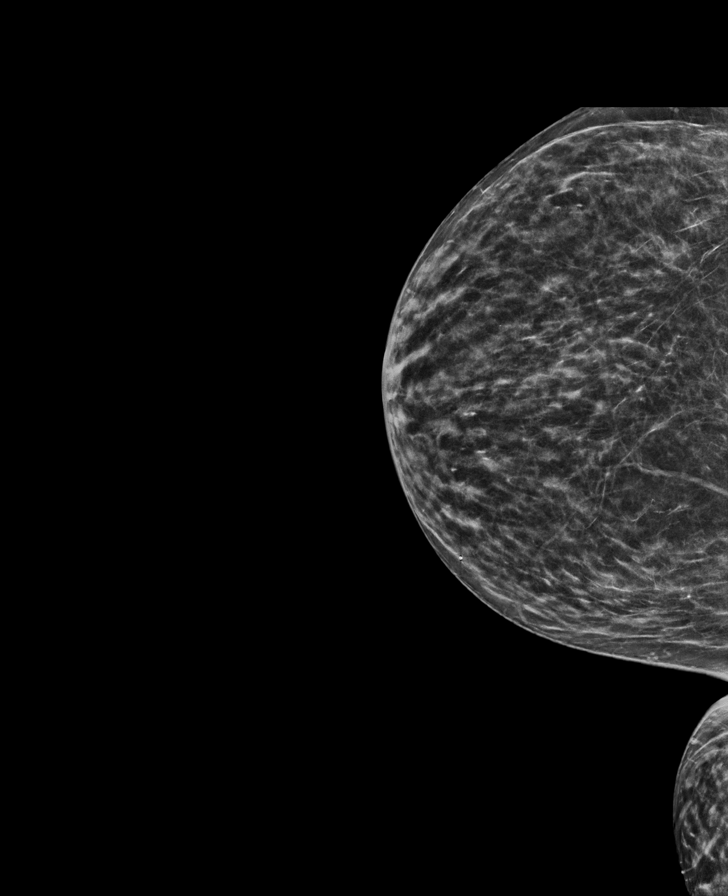

[L CC synth-2D]
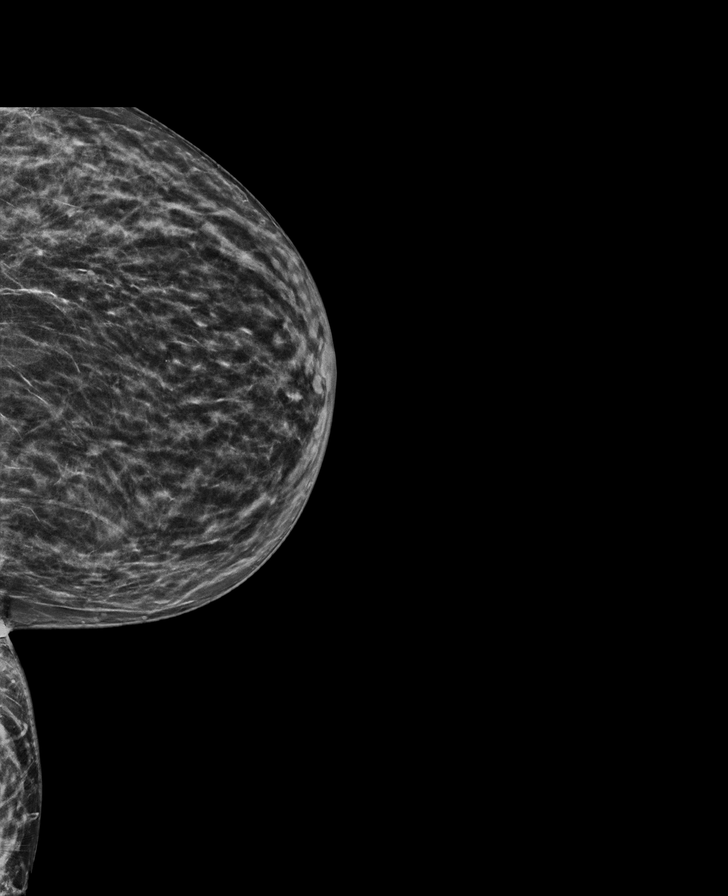

[L CC tomo · tomo slice 27/53.0]
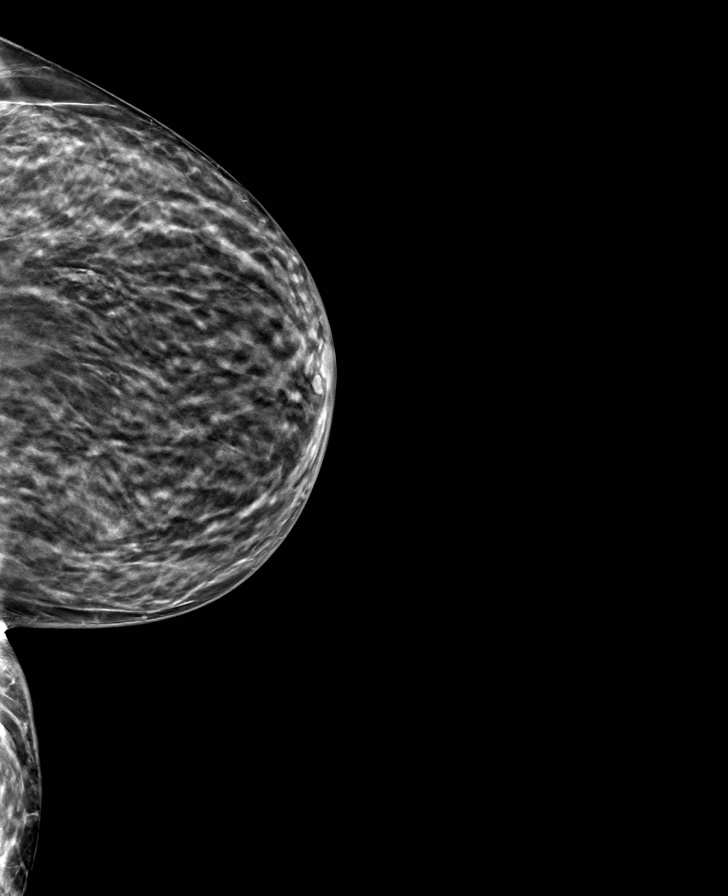

[R CC tomo · tomo slice 27/53.0]
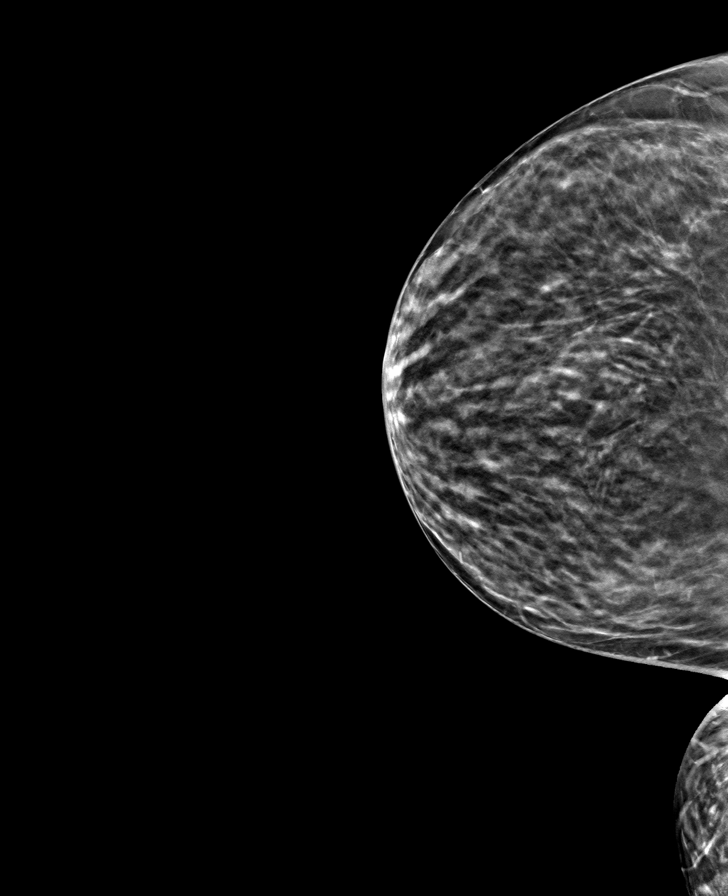

[R MLO tomo · tomo slice 27/52.0]
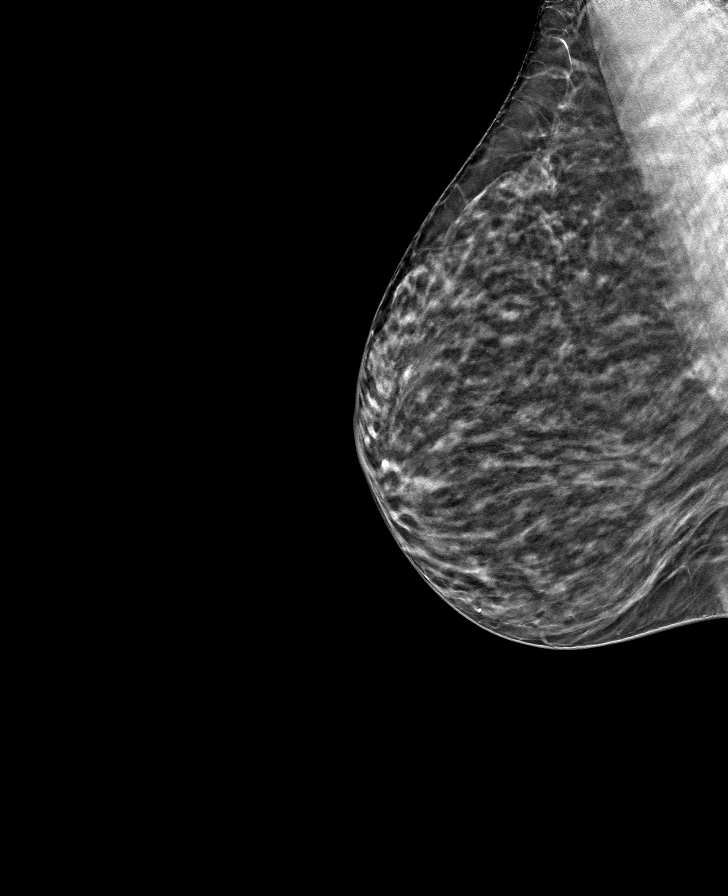

[L MLO tomo · tomo slice 27/54.0]
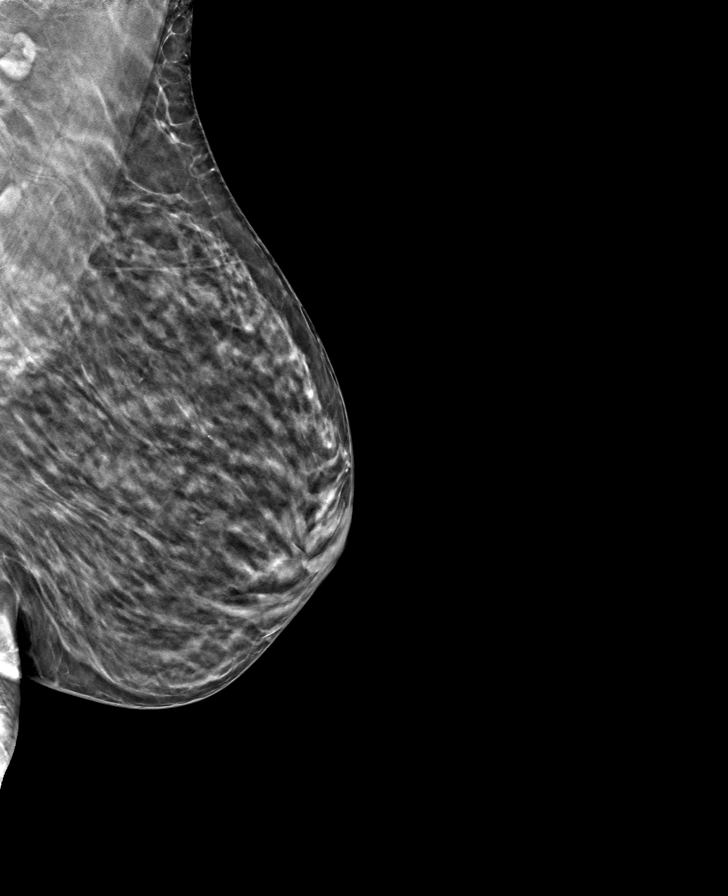

[8 of 24 positions shown; findings below may reference images not displayed]

ACR Breast Density Category c: The breast tissue is heterogeneously
dense, which may obscure small masses.
FINDINGS: There are no findings suspicious for malignancy. Images were
processed with CAD.
IMPRESSION: No mammographic evidence of malignancy. A result letter of this
screening mammogram will be mailed directly to the patient.

RECOMMENDATION:
Screening mammogram in one year. (Code:[id number]-[id number])

BI-RADS CATEGORY  1: Negative.

## 2021-11-05 NOTE — Telephone Encounter (Signed)
Attempted to call a few times, got the voicemail each time. Left voicemail asking to return out call ?

## 2021-11-08 ENCOUNTER — Other Ambulatory Visit: Payer: Self-pay | Admitting: Internal Medicine

## 2021-11-09 NOTE — Telephone Encounter (Signed)
Patient called to inform Dr Amil Amen that she is now done with dental procedures. ?

## 2021-11-12 ENCOUNTER — Other Ambulatory Visit: Payer: Self-pay | Admitting: Internal Medicine

## 2021-11-12 DIAGNOSIS — M5442 Lumbago with sciatica, left side: Secondary | ICD-10-CM

## 2021-12-06 ENCOUNTER — Other Ambulatory Visit: Payer: Self-pay | Admitting: Internal Medicine

## 2021-12-13 ENCOUNTER — Ambulatory Visit (INDEPENDENT_AMBULATORY_CARE_PROVIDER_SITE_OTHER): Payer: Medicare Other

## 2021-12-13 ENCOUNTER — Encounter: Payer: Self-pay | Admitting: Podiatry

## 2021-12-13 ENCOUNTER — Ambulatory Visit (INDEPENDENT_AMBULATORY_CARE_PROVIDER_SITE_OTHER): Payer: Medicare Other | Admitting: Podiatry

## 2021-12-13 DIAGNOSIS — M2012 Hallux valgus (acquired), left foot: Secondary | ICD-10-CM

## 2021-12-13 DIAGNOSIS — M2011 Hallux valgus (acquired), right foot: Secondary | ICD-10-CM | POA: Diagnosis not present

## 2021-12-14 ENCOUNTER — Other Ambulatory Visit: Payer: Self-pay | Admitting: Internal Medicine

## 2021-12-14 DIAGNOSIS — Z1231 Encounter for screening mammogram for malignant neoplasm of breast: Secondary | ICD-10-CM

## 2021-12-14 NOTE — Progress Notes (Signed)
Subjective:  ? ?Patient ID: Roberta Clark, female   DOB: 61 y.o.   MRN: [id number]  ? ?HPI ?Patient presents stating her bunion has been very sore on her left and states the right is also sore but not to the same degree and it is hard for her to wear shoe gear comfortably ? ? ?ROS ? ? ?   ?Objective:  ?Physical Exam  ?Neurovascular status intact muscle strength found to be adequate range of motion found to be within normal limits.  Patient is found to have hyperostosis medial aspect first metatarsal head left over right with redness around the joint and pain with palpation.  Patient states she is tried wider shoes she is tried medication she has tried padding and soaks without relief of symptoms. ? ?   ?Assessment:  ?Chronic structural bunion deformity left over right ? ?   ?Plan:  ?H&P x-rays reviewed and I recommended surgical intervention.  I educated her on distal osteotomy explained procedure risk allowed her to read consent form going over alternative treatments complications and patient wants surgery.  Patient signed consent form after extensive review understands recovery is approximately 6 months and today air fracture walker was dispensed with all instructions on usage and I want her to wear it prior to surgery so she is used to it encouraged to call questions concerns ? ?X-rays indicate there is elevation of the 1 2 intermetatarsal angle bilateral of approximate 15 degrees and tibial sesamoidal shift with mild deviation of the hallux bilateral ?   ? ? ?

## 2021-12-17 ENCOUNTER — Other Ambulatory Visit: Payer: Self-pay | Admitting: Internal Medicine

## 2021-12-28 ENCOUNTER — Telehealth: Payer: Self-pay

## 2021-12-28 NOTE — Telephone Encounter (Signed)
DOS 01/25/2022 ? ?[place] BUNIONECTOMY LT - 47841 ? ?[employer] MEDICARE EFFECTIVE DATE - 12/13/2021 ? ?PLAN DEDUCTIBLE - $0.00 ?OUT OF POCKET - $8300.00 W/ $8300.00 REMAINING ? ?CO-INSURANCE ?20% / Day OUTPATIENT SURGERY ?20% / [place] ?COPAY ?$0 / Day OUTPATIENT SURGERY ?$78 / [place] ? ? ?Notification or Prior Authorization is not required for the requested services ? ?This [employer] Medicare Advantage members plan does not currently require a prior authorization for these services. If you have general questions about the prior authorization requirements, please call us at [phone] or visit [email or web address] > Clinician Resources > Advance and Admission Notification Requirements. The number above acknowledges your notification. Please write this number down for future reference. Notification is not a guarantee of coverage or payment. ? ?Decision ID #:[id number] ?

## 2022-01-03 ENCOUNTER — Other Ambulatory Visit: Payer: Self-pay | Admitting: Sports Medicine

## 2022-01-03 ENCOUNTER — Other Ambulatory Visit: Payer: Self-pay | Admitting: Internal Medicine

## 2022-01-05 ENCOUNTER — Other Ambulatory Visit: Payer: Self-pay | Admitting: Internal Medicine

## 2022-01-11 NOTE — Telephone Encounter (Signed)
Please see if she is still using Aleve PM--cannot take the ibuprofen if taking the Aleve p.m.

## 2022-01-13 ENCOUNTER — Other Ambulatory Visit (HOSPITAL_COMMUNITY): Payer: Self-pay | Admitting: Orthopedic Surgery

## 2022-01-13 ENCOUNTER — Other Ambulatory Visit: Payer: Self-pay | Admitting: Orthopedic Surgery

## 2022-01-13 DIAGNOSIS — M25551 Pain in right hip: Secondary | ICD-10-CM

## 2022-01-14 ENCOUNTER — Telehealth: Payer: Self-pay | Admitting: Internal Medicine

## 2022-01-14 DIAGNOSIS — M810 Age-related osteoporosis without current pathological fracture: Secondary | ICD-10-CM

## 2022-01-14 MED ORDER — IBUPROFEN 800 MG PO TABS: ORAL_TABLET | ORAL | 2 refills | Status: DC

## 2022-01-17 ENCOUNTER — Ambulatory Visit
Admission: RE | Admit: 2022-01-17 | Discharge: 2022-01-17 | Disposition: A | Discharge status: Home / Self Care | Payer: Medicare Other | Source: Ambulatory Visit | Attending: Internal Medicine | Admitting: Internal Medicine

## 2022-01-17 DIAGNOSIS — Z1231 Encounter for screening mammogram for malignant neoplasm of breast: Secondary | ICD-10-CM

## 2022-01-18 NOTE — Telephone Encounter (Signed)
Referral has been sent.

## 2022-01-24 MED ORDER — ONDANSETRON HCL 4 MG PO TABS: 4.0000 mg | ORAL_TABLET | Freq: Three times a day (TID) | ORAL | 0 refills | Status: DC | PRN

## 2022-01-24 MED ORDER — OXYCODONE-ACETAMINOPHEN 10-325 MG PO TABS: 1.0000 | ORAL_TABLET | ORAL | 0 refills | Status: DC | PRN

## 2022-01-24 NOTE — Addendum Note (Signed)
Addended by: Wallene Huh on: 01/24/2022 02:04 PM   Modules accepted: Orders

## 2022-01-25 DIAGNOSIS — M2012 Hallux valgus (acquired), left foot: Secondary | ICD-10-CM | POA: Diagnosis not present

## 2022-01-31 ENCOUNTER — Other Ambulatory Visit: Payer: Self-pay | Admitting: Internal Medicine

## 2022-01-31 ENCOUNTER — Ambulatory Visit (INDEPENDENT_AMBULATORY_CARE_PROVIDER_SITE_OTHER): Payer: Medicare Other | Admitting: Podiatry

## 2022-01-31 ENCOUNTER — Encounter: Payer: Self-pay | Admitting: Podiatry

## 2022-01-31 ENCOUNTER — Ambulatory Visit (INDEPENDENT_AMBULATORY_CARE_PROVIDER_SITE_OTHER): Payer: Medicare Other

## 2022-01-31 DIAGNOSIS — F419 Anxiety disorder, unspecified: Secondary | ICD-10-CM

## 2022-01-31 DIAGNOSIS — Z9889 Other specified postprocedural states: Secondary | ICD-10-CM

## 2022-01-31 DIAGNOSIS — F32A Depression, unspecified: Secondary | ICD-10-CM

## 2022-01-31 MED ORDER — HYDROCODONE-ACETAMINOPHEN 10-325 MG PO TABS: 1.0000 | ORAL_TABLET | Freq: Three times a day (TID) | ORAL | 0 refills | Status: AC | PRN

## 2022-01-31 NOTE — Progress Notes (Signed)
Subjective:   Patient ID: Roberta Clark, female   DOB: 61 y.o.   MRN: [id number]   HPI Patient states overall doing well does have discomfort if she does too much   ROS      Objective:  Physical Exam  Neurovascular status intact negative Bevelyn Buckles' sign noted left foot healing well wound edges well coapted hallux in rectus position good range of motion     Assessment:  Doing well post osteotomy first metatarsal left     Plan:  Reviewed condition and went ahead today and I reapplied sterile dressing elevation compression immobilization to be continued and reappoint to recheck  X-rays indicate osteotomies healing well fixation in place joint congruence

## 2022-02-24 ENCOUNTER — Other Ambulatory Visit: Payer: Self-pay

## 2022-02-24 MED ORDER — LISINOPRIL 10 MG PO TABS: ORAL_TABLET | ORAL | 11 refills | Status: DC

## 2022-03-01 ENCOUNTER — Other Ambulatory Visit: Payer: Self-pay | Admitting: Internal Medicine

## 2022-03-01 NOTE — Telephone Encounter (Signed)
Not a Clinic patient.   

## 2022-03-11 ENCOUNTER — Encounter: Payer: Self-pay | Admitting: Podiatry

## 2022-03-11 ENCOUNTER — Ambulatory Visit (INDEPENDENT_AMBULATORY_CARE_PROVIDER_SITE_OTHER): Payer: Medicare Other | Admitting: Podiatry

## 2022-03-11 ENCOUNTER — Other Ambulatory Visit: Payer: Medicare Other

## 2022-03-11 ENCOUNTER — Ambulatory Visit (INDEPENDENT_AMBULATORY_CARE_PROVIDER_SITE_OTHER): Payer: Medicare Other

## 2022-03-11 DIAGNOSIS — M2012 Hallux valgus (acquired), left foot: Secondary | ICD-10-CM

## 2022-03-11 DIAGNOSIS — Z9889 Other specified postprocedural states: Secondary | ICD-10-CM

## 2022-03-14 NOTE — Progress Notes (Signed)
Subjective:   Patient ID: Roberta Clark, female   DOB: 61 y.o.   MRN: [id number]   HPI Patient states doing well with surgery still have some swelling if I am on it too much   ROS      Objective:  Physical Exam  Neurovascular status intact negative Bevelyn Buckles' sign noted left foot healing well wound edges well coapted good range of motion no crepitus in the joint noted     Assessment:  Doing well post osteotomy first metatarsal left with good alignment and good range of motion     Plan:  H&P x-rays reviewed and advised on continued compression elevation and immobilization with gradual increase in activity over the next 4 weeks and reappoint to recheck 4 to 6 weeks  X-rays indicate osteotomies healing well fixation in place joint congruence

## 2022-03-16 ENCOUNTER — Other Ambulatory Visit: Payer: Self-pay | Admitting: Internal Medicine

## 2022-04-14 ENCOUNTER — Telehealth: Payer: Self-pay

## 2022-04-14 NOTE — Telephone Encounter (Signed)
Patient called asking for Dr Amil Amen to give her a call back. She did not want to discuss detail as she felt it was personal and only wanted to discuss with Dr Amil Amen. She informed me that it was in regards to her bowel movement.

## 2022-04-22 ENCOUNTER — Other Ambulatory Visit: Payer: Self-pay | Admitting: Internal Medicine

## 2022-05-17 ENCOUNTER — Encounter: Payer: Medicare Other | Admitting: Internal Medicine

## 2022-06-01 ENCOUNTER — Other Ambulatory Visit: Payer: Self-pay | Admitting: Internal Medicine

## 2022-06-20 ENCOUNTER — Other Ambulatory Visit: Payer: Self-pay | Admitting: Internal Medicine

## 2022-06-29 ENCOUNTER — Ambulatory Visit (INDEPENDENT_AMBULATORY_CARE_PROVIDER_SITE_OTHER): Payer: Medicare Other

## 2022-06-29 ENCOUNTER — Ambulatory Visit (INDEPENDENT_AMBULATORY_CARE_PROVIDER_SITE_OTHER): Payer: Medicare Other | Admitting: Podiatry

## 2022-06-29 DIAGNOSIS — M7752 Other enthesopathy of left foot: Secondary | ICD-10-CM

## 2022-06-29 DIAGNOSIS — M79672 Pain in left foot: Secondary | ICD-10-CM

## 2022-06-29 MED ORDER — TRIAMCINOLONE ACETONIDE 10 MG/ML IJ SUSP
10.0000 mg | Freq: Once | INTRAMUSCULAR | Status: AC
  Administered 2022-06-29: 10 mg

## 2022-06-29 MED ORDER — DICLOFENAC SODIUM 75 MG PO TBEC: 75.0000 mg | DELAYED_RELEASE_TABLET | Freq: Two times a day (BID) | ORAL | 2 refills | Status: DC

## 2022-06-30 NOTE — Progress Notes (Signed)
Subjective:   Patient ID: Roberta Clark, female   DOB: 61 y.o.   MRN: [id number]   HPI Patient presents stating she started develop pain in the left foot over the last month.  States she was doing great with surgery and this only started recently and she had been doing very well and was pleased with the surgical procedure near   ROS      Objective:  Physical Exam  Neurovascular status intact with patient found to have well-healed surgical site first metatarsal left with no inflammation currently good range of motion for postop with patient found to have inflammation mostly around the second MPJ localized and slightly into the lateral side of the first MPJ     Assessment:  Appears to be inflammatory condition with capsular fluid buildup around the second MPJ which may be due to increase in her gait process     Plan:  H&P reviewed x-rays and went ahead today did sterile prep and did a periarticular injection around the second MPJ slightly into the lateral side first MPJ 2 mg Dexasone Kenalog 5 mg Xylocaine placed on oral anti-inflammatory and gave instructions for rigid bottom shoes.  Reappoint if symptoms persist  X-rays do indicate that the osteotomy has healed well and the joint is congruent has no other signs of issue at this point

## 2022-07-05 ENCOUNTER — Other Ambulatory Visit: Payer: Self-pay | Admitting: Podiatry

## 2022-07-05 DIAGNOSIS — M7752 Other enthesopathy of left foot: Secondary | ICD-10-CM

## 2022-07-18 ENCOUNTER — Other Ambulatory Visit: Payer: Self-pay | Admitting: Internal Medicine

## 2022-07-18 DIAGNOSIS — M810 Age-related osteoporosis without current pathological fracture: Secondary | ICD-10-CM

## 2022-07-21 ENCOUNTER — Telehealth: Payer: Self-pay | Admitting: Pharmacy Technician

## 2022-07-21 ENCOUNTER — Other Ambulatory Visit: Payer: Self-pay

## 2022-07-21 NOTE — Telephone Encounter (Signed)
Dr. Buddy Duty, [facility]ier note:  Auth Submission: NO AUTH NEEDED Payer: [employer] / [place] medicaid Medication & CPT/J Code(s) submitted: Reclast (Zolendronic acid) [id number] Route of submission (phone, fax, portal):  Phone # Fax # Auth type: Buy/Bill Units/visits requested: x1 Reference number:  Approval from: 07/21/22 to 08/14/22   Patient will be scheduled as soon as possible

## 2022-08-01 NOTE — Progress Notes (Signed)
Order for mammogram

## 2022-08-02 ENCOUNTER — Ambulatory Visit (INDEPENDENT_AMBULATORY_CARE_PROVIDER_SITE_OTHER): Payer: Medicare Other | Admitting: *Deleted

## 2022-08-02 VITALS — BP 129/81 | HR 59 | Temp 98.3°F | Resp 16 | Ht 64.0 in | Wt 102.6 lb

## 2022-08-02 DIAGNOSIS — M810 Age-related osteoporosis without current pathological fracture: Secondary | ICD-10-CM

## 2022-08-02 MED ORDER — DIPHENHYDRAMINE HCL 25 MG PO CAPS
25.0000 mg | ORAL_CAPSULE | Freq: Once | ORAL | Status: AC
  Administered 2022-08-02: 25 mg via ORAL
  Filled 2022-08-02: qty 1

## 2022-08-02 MED ORDER — ZOLEDRONIC ACID 5 MG/100ML IV SOLN
5.0000 mg | Freq: Once | INTRAVENOUS | Status: AC
  Administered 2022-08-02: 5 mg via INTRAVENOUS
  Filled 2022-08-02: qty 100

## 2022-08-02 MED ORDER — SODIUM CHLORIDE 0.9 % IV SOLN: INTRAVENOUS | Status: DC

## 2022-08-02 MED ORDER — ACETAMINOPHEN 325 MG PO TABS
650.0000 mg | ORAL_TABLET | Freq: Once | ORAL | Status: AC
  Administered 2022-08-02: 650 mg via ORAL
  Filled 2022-08-02: qty 2

## 2022-08-02 NOTE — Progress Notes (Signed)
Diagnosis: Osteoporosis  Provider:  Marshell Garfinkel MD  Procedure: Infusion  IV Type: Peripheral, IV Location: R Antecubital  Reclast (Zolendronic Acid), Dose: 5 mg  Infusion Start Time: 1550 pm  Infusion Stop Time: 1621 pm  Post Infusion IV Care: Observation period completed and Peripheral IV Discontinued  Discharge: Condition: Good, Destination: Home . AVS provided to patient.   Performed by:  Oren Beckmann, RN

## 2022-08-03 ENCOUNTER — Other Ambulatory Visit: Payer: Self-pay | Admitting: Internal Medicine

## 2022-08-03 ENCOUNTER — Encounter: Payer: Self-pay | Admitting: Internal Medicine

## 2022-08-03 ENCOUNTER — Ambulatory Visit (INDEPENDENT_AMBULATORY_CARE_PROVIDER_SITE_OTHER): Payer: Medicare Other | Admitting: Internal Medicine

## 2022-08-03 VITALS — BP 132/80 | HR 68 | Resp 16 | Ht 63.0 in | Wt 102.0 lb

## 2022-08-03 DIAGNOSIS — E119 Type 2 diabetes mellitus without complications: Secondary | ICD-10-CM

## 2022-08-03 DIAGNOSIS — Z23 Encounter for immunization: Secondary | ICD-10-CM

## 2022-08-03 DIAGNOSIS — E782 Mixed hyperlipidemia: Secondary | ICD-10-CM

## 2022-08-03 DIAGNOSIS — Z5948 Other specified lack of adequate food: Secondary | ICD-10-CM

## 2022-08-03 DIAGNOSIS — Z0000 Encounter for general adult medical examination without abnormal findings: Secondary | ICD-10-CM | POA: Diagnosis not present

## 2022-08-03 DIAGNOSIS — N898 Other specified noninflammatory disorders of vagina: Secondary | ICD-10-CM

## 2022-08-03 DIAGNOSIS — Z124 Encounter for screening for malignant neoplasm of cervix: Secondary | ICD-10-CM

## 2022-08-03 DIAGNOSIS — I10 Essential (primary) hypertension: Secondary | ICD-10-CM

## 2022-08-03 DIAGNOSIS — R55 Syncope and collapse: Secondary | ICD-10-CM

## 2022-08-03 LAB — POCT WET PREP WITH KOH
Clue Cells Wet Prep HPF POC: NEGATIVE
KOH Prep POC: NEGATIVE
Trichomonas, UA: NEGATIVE
Yeast Wet Prep HPF POC: NEGATIVE

## 2022-08-03 MED ORDER — METAMUCIL SMOOTH TEXTURE 58.6 % PO POWD: ORAL | 12 refills | Status: AC

## 2022-08-03 MED ORDER — CALCIUM CITRATE-VITAMIN D 500-12.5 MG-MCG PO CHEW: CHEWABLE_TABLET | ORAL | 11 refills | Status: DC

## 2022-08-03 NOTE — Patient Instructions (Signed)
Recommend the following vaccines:   RSV COVID pfizer or spikevax (moderna) 2nd Shingrix Td

## 2022-08-03 NOTE — Progress Notes (Signed)
Subjective:    Patient ID: Roberta Clark, female   DOB: February 21, 1961, 61 y.o.   MRN: [id number]   HPI  CPE with pap  1.  Pap:  Last performed 06/12/2019 and normal.    2.  Mammogram:  Last performed in June and normal.  No family history of breast cancer.    3.  Osteoprevention:  Received her first yearly infusion of Zolendronic Acid 5 mg yesterday at [facility] facility through Dr. Buddy Duty.    4.  Guaiac Cards/FIT: Last in 2020 and negative.  5.  Colonoscopy:  Last colonoscopy 06/2018 and normal with Dr. Michail Sermon save for internal hemorrhoids.  No family history of colon cancer.    6.  Immunizations:  Has not had influenza, COVID, RSV, or second Shingrix. Immunization History  Administered Date(s) Administered   Influenza Inj Mdck Quad Pf 07/12/2016, 07/10/2017, 06/12/2019, 07/12/2021   Influenza Inj Mdck Quad With Preservative 06/17/2020   Influenza,inj,Quad PF,6+ Mos 06/06/2018   Influenza,inj,quad, With Preservative 07/15/2017, 06/06/2018   Moderna Sars-Covid-2 Vaccination 10/14/2019, 11/18/2019   Pfizer Covid-19 Vaccine Bivalent Booster 80yr & up 07/12/2021   Pneumococcal Polysaccharide-23 02/02/2016   Tdap 08/16/2011   Zoster Recombinat (Shingrix) 01/13/2022     7.  Glucose/Cholesterol:  A1C last year was 6.4% and cholesterol remains out of goal. Lipid Panel     Component Value Date/Time   CHOL 187 06/22/2021 1007   TRIG 167 (H) 06/22/2021 1007   HDL 37 (L) 06/22/2021 1007   CHOLHDL 4.4 07/25/2012 1413   VLDL 19 07/25/2012 1413   LDLCALC 120 (H) 06/22/2021 1007   LDLDIRECT 111 (H) 06/10/2009 1933   LABVLDL 30 06/22/2021 1007     Current Meds  Medication Sig   amitriptyline (ELAVIL) 25 MG tablet TAKE ONE TABLET BY MOUTH AT BEDTIME AS NEEDED SLEEP   BIOTIN PO Take by mouth.   Blood Glucose Monitoring Suppl (ONETOUCH VERIO) w/Device KIT Check blood glucose twice daily before meals   diclofenac (VOLTAREN) 75 MG EC tablet Take 1 tablet (75 mg total) by mouth 2  (two) times daily.   gabapentin (NEURONTIN) 600 MG tablet TAKE ONE TABLET BY MOUTH EVERY MORNING AND AT MIDDAY,AND THEN TAKE TWO TABLETS BY MOUTH AT BEDTIME]   glipiZIDE (GLUCOTROL XL) 5 MG 24 hr tablet Take 1 tablet (5 mg total) by mouth daily with breakfast.   ibuprofen (ADVIL) 800 MG tablet 1 tab by mouth daily with meal for severe pain   Lancets (ONETOUCH DELICA PLUS [id number] MISC USE   TO CHECK GLUCOSE TWICE DAILY BEFORE MEAL(S)   lisinopril (ZESTRIL) 10 MG tablet 1 tab by mouth daily with Lisinopril/HCTZ combination   lisinopril-hydrochlorothiazide (ZESTORETIC) 20-12.5 MG tablet TAKE ONE TABLET BY MOUTH EVERY DAY   Melatonin 3 MG SUBL Place 6 mg under the tongue at bedtime.   methocarbamol (ROBAXIN) 500 MG tablet Take 500 mg by mouth every 8 (eight) hours as needed for muscle spasms.    Multiple Vitamin (MULTIVITAMIN ADULT PO) Take by mouth.   Omega-3 Fatty Acids (FISH OIL) 1000 MG CAPS Take 1,000 mg by mouth 2 (two) times daily.    omeprazole (PRILOSEC) 20 MG capsule TAKE ONE CAPSULE BY MOUTH ON EMPTY stomach EVERY DAY   ondansetron (ZOFRAN) 4 MG tablet Take 1 tablet (4 mg total) by mouth every 8 (eight) hours as needed for nausea or vomiting.   ONETOUCH VERIO test strip USE  STRIP TO CHECK GLUCOSE TWICE DAILY BEFORE MEAL(S)   potassium chloride (KLOR-CON M) 10 MEQ  tablet TAKE ONE TABLET BY MOUTH EVERY DAY   rosuvastatin (CRESTOR) 20 MG tablet TAKE ONE TABLET BY MOUTH EVERY DAY with evening meal   Allergies  Allergen Reactions   Atorvastatin Rash   Metformin And Related Nausea And Vomiting   Past Medical History:  Diagnosis Date   Adenomatous colon polyp 05/13/2013   Anemia    Arthritis    Chronic pain    Diabetes mellitus without complication 2011   Type II - diet controlled   Family history of adverse reaction to anesthesia    mother - nausea   Hyperlipidemia 2011   Rash with statin:  not well controlled with Gemfibrozil   Hypertension 2011   Insomnia secondary to chronic  pain    Low back pain with left-sided sciatica    Reportedly with lumbar spinal stenosis   Past Surgical History:  Procedure Laterality Date   BACK SURGERY     COLONOSCOPY     LUMBAR LAMINECTOMY/DECOMPRESSION MICRODISCECTOMY Bilateral 05/27/2016   Procedure: Left Lumbar Four-Five Laminectomy and Foraminotomy;  Surgeon: Tia Alert, MD;  Location: [facility] OR;  Service: Neurosurgery;  Laterality: Bilateral;   SPINAL CORD STIMULATOR INSERTION N/A 07/01/2019   Procedure: Spinal cord stimulator via Thoracic Nine laminectomy;  Surgeon: Tia Alert, MD;  Location: [facility] OR;  Service: Neurosurgery;  Laterality: N/A;  Spinal cord stimulator via Thoracic Nine laminectomy   TOTAL HIP ARTHROPLASTY Left 10/24/2016   Procedure: LEFT TOTAL HIP ARTHROPLASTY ANTERIOR APPROACH;  Surgeon: Ollen Gross, MD;  Location: [facility];  Service: Orthopedics;  Laterality: Left;   TOTAL HIP ARTHROPLASTY Right 02/22/2017   Procedure: RIGHT TOTAL HIP ARTHROPLASTY ANTERIOR APPROACH;  Surgeon: Ollen Gross, MD;  Location: [facility];  Service: Orthopedics;  Laterality: Right;   TUBAL LIGATION  1985   Family History  Problem Relation Age of Onset   Heart disease Mother 71       Hx of MI    Hypertension Mother    Diabetes Mother    Diabetes Sister    Luiz Blare' disease Sister    Lupus Father    Graves' disease Brother    Diabetes Brother    Other Brother        COVID   Arthritis Daughter        Not clear if RA   Hypertension Daughter    Migraines Daughter    Epilepsy Son        Started 43 months of age after bacterial meningitis   GER disease Sister    Social History   Socioeconomic History   Marital status: Single    Spouse name: Adela Glimpse   Number of children: 2   Years of education: 14   Highest education level: Associate degree: occupational, Scientist, product/process development, or vocational program  Occupational History   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   Children live in town and are supportive.   Originally from [place].   Went to [employer].   Social Determinants of Health   Financial Resource Strain: Low Risk  (06/12/2019)   Overall Financial Resource Strain (CARDIA)    Difficulty of Paying Living Expenses: Not very hard  Food Insecurity: No Food Insecurity (06/17/2020)   Hunger Vital Sign    Worried About Running Out of Food in the Last Year: Never true    Ran Out of Food in the Last Year: Never true  Transportation Needs: No Transportation Needs (06/17/2020)   PRAPARE - Administrator, Civil Service (Medical): No    Lack of Transportation (Non-Medical): No  Physical Activity: Not on file  Stress: Not on file  Social Connections: Unknown (06/12/2019)   Social Connection and Isolation Panel [NHANES]    Frequency of Communication with Friends and Family: Not on file    Frequency of Social Gatherings with Friends and Family: Not on file    Attends Religious Services: Not on file    Active Member of Clubs or Organizations: Not on file    Attends Banker Meetings: Not on file    Marital Status: Never married  Intimate Partner Violence: Not At Risk (06/17/2020)   Humiliation, Afraid, Rape, and Kick questionnaire    Fear of Current or Ex-Partner: No    Emotionally Abused: No    Physically Abused: No    Sexually Abused: No     Review of Systems   Gastrointestinal:        Having episodes of stool in her underwear.  Sometimes little balls and at times feels passing them and at others, does not realize she has dirtied her underwear.  Not usually a large amount.   Has been a problem for a couple of years.  Not getting worse. No problems with urinary continence  Genitourinary:  Positive for vaginal discharge (Not sexually active.  yellow with odor.  Odor with urination as well   + burning and mild itch.).  Neurological:  Positive for syncope (States lost consciousness [place]th [place]--had no symptoms prior.  States fell to ground.  Was out for just a couple seconds.  At that point, just felt woozy and legs were rubber.  Sept 23rd walking into house and sam). Tremors: .Marland Kitchen      See syncope below.  Has not had any symptoms since Sept 23rd.  No palpitations associated.  No injury with passing out.  Psychiatric/Behavioral:  Positive for agitation.       Objective:   BP 132/80 (BP Location: Left Arm, Patient Position: Sitting, Cuff Size: Normal)   Pulse 68   Resp 16   Ht 5\' 3"  (1.6 m)   Wt 102 lb (46.3 kg)   BMI 18.07 kg/m   Physical Exam Constitutional:      Appearance: She is normal weight.  HENT:     Head: Normocephalic and atraumatic.     Right Ear: Tympanic membrane, ear canal and external ear normal.     Left Ear: Tympanic membrane, ear canal and external ear normal.     Nose: Nose normal.     Mouth/Throat:     Mouth: Mucous membranes are dry.     Pharynx: Oropharynx is clear.  Eyes:     Extraocular Movements: Extraocular movements intact.  Conjunctiva/sclera: Conjunctivae normal.     Pupils: Pupils are equal, round, and reactive to light.     Comments: Discs sharp  Neck:     Thyroid: No thyroid mass or thyromegaly.  Cardiovascular:     Rate and Rhythm: Normal rate and regular rhythm.     Pulses:          Dorsalis pedis pulses are 2+ on the right side and 2+ on the left side.        Posterior tibial pulses are 2+ on the right side and 2+ on the left side.     Heart sounds: S1 normal and S2 normal. No murmur heard.    No friction rub. No S3 or S4 sounds.     Comments: No carotid bruits.  Carotid, radial, femoral, DP and PT pulses normal and equal.   Pulmonary:     Effort: Pulmonary effort is normal.     Breath sounds: Normal breath sounds and air entry.  Chest:  Breasts:    Right: No inverted nipple, mass or nipple discharge.     Left: No inverted nipple, mass or nipple discharge.  Abdominal:     General: Abdomen is flat. Bowel sounds are normal.     Palpations: Abdomen is soft. There is no hepatomegaly, splenomegaly or mass.     Tenderness: There is no abdominal tenderness.     Hernia: No hernia is present.  Genitourinary:    Comments: Normal external female genitalia No uterine or adnexal mass or tenderness. Scant white discharge.  No underlying cervical or vaginal mucosa inflammation or lesion.  Some mucosal atrophy. Musculoskeletal:        General: Normal range of motion.     Cervical back: Normal range of motion and neck supple.     Right lower leg: No edema.     Left lower leg: No edema.  Feet:     Right foot:     Protective Sensation: 10 sites tested.  10 sites sensed.     Skin integrity: Skin integrity normal.     Toenail Condition: Right toenails are normal.     Left foot:     Protective Sensation: 10 sites tested.  10 sites sensed.     Skin integrity: Skin integrity normal.     Toenail Condition: Left toenails are normal.  Lymphadenopathy:     Head:     Right side of head: No submental or submandibular adenopathy.     Left side of head: No submental or submandibular adenopathy.     Cervical: No cervical adenopathy.     Upper Body:     Right upper body: No supraclavicular or axillary adenopathy.     Left upper body: No supraclavicular or axillary adenopathy.     Lower Body: No right inguinal adenopathy. No left inguinal adenopathy.   Skin:    General: Skin is warm.     Capillary Refill: Capillary refill takes less than 2 seconds.     Findings: No rash.  Neurological:     General: No focal deficit present.     Mental Status: She is alert and oriented to person, place, and time.     Cranial Nerves: Cranial nerves 2-12 are intact.     Sensory: Sensation is intact.     Motor: Motor function is intact.     Coordination: Coordination is intact.     Gait: Gait is intact.     Deep Tendon Reflexes: Reflexes are normal and symmetric.  Psychiatric:  Attention and Perception: Attention normal.        Speech: Speech normal.        Behavior: Behavior normal. Behavior is cooperative.      Assessment & Plan    CPE with pap Mammogram again next June 2024 Citrated calcium 500 mg with 400 IU of Vitamin D twice daily Influenza vaccine. Recommend she obtain at pharmacy of choice or PHD:  RSV,  2023-2024 COVID booster, 2nd Shingrix and Td.   We are currently either out or not available to insured patients.   Return FIT with labs, including CMP, CBC, TSH in 2 weeks.    2.  Vaginal discharge:  no findings today on wet prep or exam.  3.  Osteoporosis:  Receiving care with Dr. Sharl Ma, endocrine.  Receives Zolendronic Acid 5 mg at outpatient [facility] infusion center.  Last dose was yesterday.  Did not tolerate oral bisphosphonate.  Encouraged Calcium and vitamin D intake as above.  Vitamin D level with labs.    4.  DM:    A1c, urine microalbumin/crea with fasting labs.    5.  Hyperlipidemia:  FLP with fasting labs.  6.  Reported stool incontinence:  will address at follow up.  Did discuss she could be constipated and having loss of control around it.  Encouraged Metamucil daily to see if helps.    7.  Episodes of short lived syncopal episodes in September with no recurrence.  Sounds vasovagal.  To notify me if recurs.  8.  Food insecurity:  referral to [facility] to hook her up with food access.

## 2022-08-06 LAB — GC/CHLAMYDIA PROBE AMP
Chlamydia trachomatis, NAA: NEGATIVE
Neisseria Gonorrhoeae by PCR: NEGATIVE

## 2022-08-09 LAB — CYTOLOGY - PAP

## 2022-08-15 ENCOUNTER — Encounter: Payer: Self-pay | Admitting: Internal Medicine

## 2022-08-16 ENCOUNTER — Ambulatory Visit: Payer: Medicare HMO | Attending: Internal Medicine

## 2022-08-16 ENCOUNTER — Encounter: Payer: Self-pay | Admitting: Internal Medicine

## 2022-08-16 ENCOUNTER — Other Ambulatory Visit: Payer: Self-pay | Admitting: Podiatry

## 2022-08-16 ENCOUNTER — Other Ambulatory Visit: Payer: Self-pay

## 2022-08-16 ENCOUNTER — Other Ambulatory Visit: Payer: Self-pay | Admitting: Internal Medicine

## 2022-08-16 DIAGNOSIS — M810 Age-related osteoporosis without current pathological fracture: Secondary | ICD-10-CM | POA: Insufficient documentation

## 2022-08-16 DIAGNOSIS — M5459 Other low back pain: Secondary | ICD-10-CM | POA: Insufficient documentation

## 2022-08-16 DIAGNOSIS — M6281 Muscle weakness (generalized): Secondary | ICD-10-CM | POA: Insufficient documentation

## 2022-08-16 NOTE — Therapy (Signed)
OUTPATIENT PHYSICAL THERAPY EVALUATION   Patient Name: Roberta Clark MRN: [id number] DOB:[birth year], 62 y.o., female Today's Date: 08/17/2022  END OF SESSION:  PT End of Session - 08/16/22 1555     Visit Number 1    Number of Visits 7    Date for PT Re-Evaluation 10/01/22    Authorization Type [employer] and [employer]- requesting auth    PT Start Time 1531    PT Stop Time 1601    PT Time Calculation (min) 30 min    Activity Tolerance Patient tolerated treatment well    Behavior During Therapy [facility] for tasks assessed/performed             Past Medical History:  Diagnosis Date   Adenomatous colon polyp 05/13/2013   Anemia    Arthritis    Chronic pain    Diabetes mellitus without complication (HCC) 2011   Type II - diet controlled   Family history of adverse reaction to anesthesia    mother - nausea   Hyperlipidemia 2011   Rash with statin:  not well controlled with Gemfibrozil   Hypertension 2011   Insomnia secondary to chronic pain    Low back pain with left-sided sciatica    Reportedly with lumbar spinal stenosis   Past Surgical History:  Procedure Laterality Date   BACK SURGERY     COLONOSCOPY     LUMBAR LAMINECTOMY/DECOMPRESSION MICRODISCECTOMY Bilateral 05/27/2016   Procedure: Left Lumbar Four-Five Laminectomy and Foraminotomy;  Surgeon: Tia Alert, MD;  Location: [facility] OR;  Service: Neurosurgery;  Laterality: Bilateral;   SPINAL CORD STIMULATOR INSERTION N/A 07/01/2019   Procedure: Spinal cord stimulator via Thoracic Nine laminectomy;  Surgeon: Tia Alert, MD;  Location: [facility] OR;  Service: Neurosurgery;  Laterality: N/A;  Spinal cord stimulator via Thoracic Nine laminectomy   TOTAL HIP ARTHROPLASTY Left 10/24/2016   Procedure: LEFT TOTAL HIP ARTHROPLASTY ANTERIOR APPROACH;  Surgeon: Ollen Gross, MD;  Location: [facility]S;  Service: Orthopedics;  Laterality: Left;   TOTAL HIP ARTHROPLASTY Right 02/22/2017   Procedure: RIGHT TOTAL HIP ARTHROPLASTY ANTERIOR APPROACH;  Surgeon:  Ollen Gross, MD;  Location: [facility];  Service: Orthopedics;  Laterality: Right;   TUBAL LIGATION  1985   Patient Active Problem List   Diagnosis Date Noted   Carpal tunnel syndrome 10/11/2021   Paresthesia of hand 10/11/2021   Osteoporosis without current pathological fracture 06/15/2021   Pain of left hip joint 03/22/2021   S/P insertion of spinal cord stimulator 07/01/2019   Low back pain with left-sided sciatica    Long term use of drug 01/24/2019   Foot pain, bilateral 12/04/2018   Trochanteric bursitis of left hip 08/29/2018   Numbness of lower limb 12/11/2017   Inflammation of sacroiliac joint (HCC) 11/30/2017   Lumbosacral spondylosis without myelopathy 11/30/2017   Avascular necrosis of bone of hip (HCC) 08/24/2017   Avascular necrosis of femoral head, right (HCC) 02/22/2017   Avascular necrosis of femoral head, left (HCC) 10/24/2016   OA (osteoarthritis) of hip 10/24/2016   S/P lumbar laminectomy 05/27/2016   Type 2 diabetes mellitus without complication, without long-term current use of insulin (HCC) 11/03/2015   Multilevel degenerative disc disease 10/22/2014   Chronic back pain 05/27/2014   Lumbar radiculopathy, chronic 06/14/2013   Adenomatous colon polyp 05/13/2013   Vaginal discharge 08/22/2012   Primary hypertension 07/25/2012   Preventative health care 07/25/2012   Hot flash, menopausal 07/25/2012   Chronic low back pain 07/25/2012   Fibroids 09/02/2011   Tobacco  dependence 09/02/2011   Mixed hyperlipidemia 08/15/2009    PCP: Julieanne Manson, MD  REFERRING PROVIDER: Talmage Coin, MD  REFERRING DIAG: M81.0 (ICD-10-CM) - Age-related osteoporosis without current pathological fracture   THERAPY DIAG:  Muscle weakness (generalized)  Age-related osteoporosis without current pathological fracture  Other low back pain  Rationale for Evaluation and Treatment: Rehabilitation  ONSET DATE: chronic   SUBJECTIVE:   SUBJECTIVE STATEMENT: Patient  reports she just hurts. She was diagnosed with osteoporosis about a year ago. She reports sometimes she has difficulty with prolonged walking, standing, and going up/down the stairs. Patient reports chronic back pain and has a spinal cord stimulator that "works when it wants to."    PERTINENT HISTORY: Lumbar laminectomy/decompression 2017 Spinal cord stimulator 2020 Bilateral THA 2018 Diabetes  Osteoporosis   PAIN:  Are you having pain? Yes: NPRS scale: 9/10 Pain location: back Pain description: sharp Aggravating factors: walking, standing, stairs Relieving factors: unknown  PRECAUTIONS: None  WEIGHT BEARING RESTRICTIONS: No  FALLS:  Has patient fallen in last 6 months? No  LIVING ENVIRONMENT: Lives with: lives alone Lives in: House/apartment Stairs: No Has following equipment at home: None  OCCUPATION: food service   PLOF: Independent  PATIENT GOALS: "just prepare me for the inevitable."    OBJECTIVE:   DIAGNOSTIC FINDINGS: n/a  PATIENT SURVEYS:  N/A for diagnosis  COGNITION: Overall cognitive status: Within functional limits for tasks assessed     SENSATION: Not tested    POSTURE: rounded shoulders and forward head  PALPATION: N/A  MMT:  MMT Right eval Left eval  Hip flexion 4 4  Hip extension 4 4  Hip abduction 4 4  Hip adduction    Hip internal rotation    Hip external rotation    Knee flexion 5 5  Knee extension 5 5  Shoulder Flexion  4 4  Shoulder Abduction 4 4   (Blank rows = not tested)  LOWER EXTREMITY SPECIAL TESTS:  N/A   FUNCTIONAL TESTS:  5 x STS 14 seconds  Functional lifting: excessive trunk flexion, poor hip hinge  Squat: limited depth, LOB, valgus collapse   GAIT: Distance walked: 10 ft Assistive device utilized: None Level of assistance: Complete Independence Comments: WNL   [facility] Adult PT Treatment:                                                DATE: 08/16/22 Therapeutic Exercise: Demonstrated and issued  initial HEP.   Therapeutic Activity: Education on assessment findings that will be addressed throughout duration of POC.     PATIENT EDUCATION:  Education details: see treatment Person educated: Patient Education method: Explanation, Demonstration, Tactile cues, Verbal cues, and Handouts Education comprehension: verbalized understanding, returned demonstration, verbal cues required, tactile cues required, and needs further education  HOME EXERCISE PROGRAM: Access Code: [email or web address] URL: [email or web address] Date: 08/16/2022 Prepared by: Letitia Libra  Exercises - Supine Bridge  - 1 x daily - 7 x weekly - 2 sets - 10 reps - Supine Posterior Pelvic Tilt  - 1 x daily - 7 x weekly - 2 sets - 10 reps - Hooklying Clamshell with Resistance  - 1 x daily - 7 x weekly - 2 sets - 10 reps - Supine Active Straight Leg Raise  - 1 x daily - 7 x weekly - 2 sets - 10 reps  ASSESSMENT:  CLINICAL IMPRESSION:  Patient is a 62 y.o. female who was seen today for physical therapy evaluation and treatment for osteoporosis. Upon assessment she is noted to have hip, core and shoulder weakness as well as aberrant lifting mechanics. Initial HEP was issued today to include core and hip strengthening. She will benefit from skilled PT to address the above stated deficits and train in home exercise program that she can continue to manage her chronic condition.   OBJECTIVE IMPAIRMENTS: decreased activity tolerance, decreased endurance, decreased knowledge of condition, difficulty walking, decreased strength, improper body mechanics, postural dysfunction, and pain.   ACTIVITY LIMITATIONS: carrying, lifting, bending, standing, squatting, stairs, reach over head, and locomotion level  PARTICIPATION LIMITATIONS: meal prep, cleaning, laundry, shopping, community activity, and occupation  PERSONAL FACTORS: Age, Fitness, Profession, Time since onset of injury/illness/exacerbation, and 3+ comorbidities: see PMH  above  are also affecting patient's functional outcome.   REHAB POTENTIAL: Good  CLINICAL DECISION MAKING: Evolving/moderate complexity  EVALUATION COMPLEXITY: Moderate   GOALS: Goals reviewed with patient? Yes  SHORT TERM GOALS: = LTG    LONG TERM GOALS: Target date: 09/28/2022    Patient will be independent with advanced home program to assist in management of her chronic condition.  Baseline: initial HEP issued  Goal status: INITIAL  2.  Patient will be able to lift 10 lbs from the floor to waist height with proper form.  Baseline: see above  Goal status: INITIAL  3.  Patient will demonstrate 5/5 bilateral hip strength to improve stability with prolonged walking/standing activity.  Baseline: see above  Goal status: INITIAL  4.  Patient will demonstrate 5/5 bilateral shoulder flexion/abduction strength to improve ability to lift objects overhead.  Baseline: see above  Goal status: INITIAL    PLAN:  PT FREQUENCY: 1x/week  PT DURATION: other: 4-6 weeks  PLANNED INTERVENTIONS: Therapeutic exercises, Therapeutic activity, Neuromuscular re-education, Balance training, Gait training, Patient/Family education, Self Care, Stair training, Cryotherapy, Moist heat, Manual therapy, and Re-evaluation  PLAN FOR NEXT SESSION: review and progress HEP prn; bending/lifting/posture education with handout. Progress strengthening as tolerated.   Letitia Libra, PT, DPT, ATC 08/17/22 11:32 AM  Referring diagnosis? M81.0 (ICD-10-CM) - Age-related osteoporosis without current pathological fracture   Treatment diagnosis? (if different than referring diagnosis)   Muscle weakness (generalized)  Age-related osteoporosis without current pathological fracture  Other low back pain  What was this (referring dx) caused by? []  Surgery []  Fall [x]  Ongoing issue []  Arthritis []  Other: ____________  Laterality: []  Rt []  Lt [x]  Both  Check all possible CPT codes:  *CHOOSE 10 OR  LESS*    []  97110 (Therapeutic Exercise)  []  92507 (SLP Treatment)  []  97112 (Neuro Re-ed)   []  92526 (Swallowing Treatment)   []  97116 (Gait Training)   []  16109 (Cognitive Training, 1st 15 minutes) []  97140 (Manual Therapy)   []  97130 (Cognitive Training, each add'l 15 minutes)  []  97164 (Re-evaluation)                              []  Other, List CPT Code ____________  []  97530 (Therapeutic Activities)     []  97535 (Self Care)   [x]  All codes above (97110 - 97535)  []  97012 (Mechanical Traction)  []  97014 (E-stim Unattended)  []  97032 (E-stim manual)  []  97033 (Ionto)  []  97035 (Ultrasound) []  97750 (Physical Performance Training) []  [id number] (Aquatic Therapy) []  97016 (Vasopneumatic Device) []  [id number] (Paraffin) []  97034 (Contrast Bath) []  [id number] (  Wound Care 1st 20 sq cm) []  97598 (Wound Care each add'l 20 sq cm) []  97760 (Orthotic Fabrication, Fitting, Training Initial) []  [id number] (Prosthetic Management and Training Initial) []  [id number] (Orthotic or Prosthetic Training/ Modification Subsequent)

## 2022-08-17 NOTE — Telephone Encounter (Signed)
Patient reported that those medications have not yet been refilled by anyone else

## 2022-08-17 NOTE — Telephone Encounter (Signed)
Please advise 

## 2022-08-17 NOTE — Telephone Encounter (Signed)
Voicemail was left asking to return our call

## 2022-08-17 NOTE — Telephone Encounter (Signed)
Can you check on these refills with Ms. Wieczorek?  Appears they were filled by someone else in the system yesterday.  Need to clarify and see how many refills.

## 2022-08-23 ENCOUNTER — Other Ambulatory Visit (INDEPENDENT_AMBULATORY_CARE_PROVIDER_SITE_OTHER): Payer: Medicare HMO

## 2022-08-23 DIAGNOSIS — Z1211 Encounter for screening for malignant neoplasm of colon: Secondary | ICD-10-CM | POA: Diagnosis not present

## 2022-08-23 DIAGNOSIS — Z0000 Encounter for general adult medical examination without abnormal findings: Secondary | ICD-10-CM

## 2022-08-23 DIAGNOSIS — E782 Mixed hyperlipidemia: Secondary | ICD-10-CM | POA: Diagnosis not present

## 2022-08-23 DIAGNOSIS — Z79899 Other long term (current) drug therapy: Secondary | ICD-10-CM

## 2022-08-23 DIAGNOSIS — E119 Type 2 diabetes mellitus without complications: Secondary | ICD-10-CM

## 2022-08-23 LAB — POC FIT TEST STOOL: Fecal Occult Blood: NEGATIVE

## 2022-08-24 ENCOUNTER — Ambulatory Visit: Payer: Medicare HMO | Admitting: Physical Therapy

## 2022-08-24 ENCOUNTER — Encounter: Payer: Self-pay | Admitting: Physical Therapy

## 2022-08-24 ENCOUNTER — Other Ambulatory Visit: Payer: Self-pay

## 2022-08-24 DIAGNOSIS — M810 Age-related osteoporosis without current pathological fracture: Secondary | ICD-10-CM

## 2022-08-24 DIAGNOSIS — M6281 Muscle weakness (generalized): Secondary | ICD-10-CM | POA: Diagnosis not present

## 2022-08-24 DIAGNOSIS — M5459 Other low back pain: Secondary | ICD-10-CM

## 2022-08-24 LAB — CBC WITH DIFFERENTIAL/PLATELET
Basophils Absolute: 0.1 10*3/uL (ref 0.0–0.2)
Basos: 1 %
EOS (ABSOLUTE): 0.3 10*3/uL (ref 0.0–0.4)
Eos: 5 %
Hematocrit: 43.1 % (ref 34.0–46.6)
Hemoglobin: 14 g/dL (ref 11.1–15.9)
Immature Grans (Abs): 0 10*3/uL (ref 0.0–0.1)
Immature Granulocytes: 0 %
Lymphocytes Absolute: 3.1 10*3/uL (ref 0.7–3.1)
Lymphs: 52 %
MCH: 29.7 pg (ref 26.6–33.0)
MCHC: 32.5 g/dL (ref 31.5–35.7)
MCV: 92 fL (ref 79–97)
Monocytes Absolute: 0.4 10*3/uL (ref 0.1–0.9)
Monocytes: 6 %
Neutrophils Absolute: 2.2 10*3/uL (ref 1.4–7.0)
Neutrophils: 36 %
Platelets: 385 10*3/uL (ref 150–450)
RBC: 4.71 x10E6/uL (ref 3.77–5.28)
RDW: 12.6 % (ref 11.7–15.4)
WBC: 6 10*3/uL (ref 3.4–10.8)

## 2022-08-24 LAB — VITAMIN D 25 HYDROXY (VIT D DEFICIENCY, FRACTURES): Vit D, 25-Hydroxy: 50.5 ng/mL (ref 30.0–100.0)

## 2022-08-24 LAB — MICROALBUMIN / CREATININE URINE RATIO
Creatinine, Urine: 59.6 mg/dL
Microalb/Creat Ratio: 29 mg/g creat (ref 0–29)
Microalbumin, Urine: 17.5 ug/mL

## 2022-08-24 LAB — COMPREHENSIVE METABOLIC PANEL
ALT: 26 IU/L (ref 0–32)
AST: 23 IU/L (ref 0–40)
Albumin/Globulin Ratio: 1.7 (ref 1.2–2.2)
Albumin: 4.8 g/dL (ref 3.9–4.9)
Alkaline Phosphatase: 78 IU/L (ref 44–121)
BUN/Creatinine Ratio: 18 (ref 12–28)
BUN: 13 mg/dL (ref 8–27)
Bilirubin Total: 0.2 mg/dL (ref 0.0–1.2)
CO2: 21 mmol/L (ref 20–29)
Calcium: 9.4 mg/dL (ref 8.7–10.3)
Chloride: 105 mmol/L (ref 96–106)
Creatinine, Ser: 0.74 mg/dL (ref 0.57–1.00)
Globulin, Total: 2.8 g/dL (ref 1.5–4.5)
Glucose: 116 mg/dL — ABNORMAL HIGH (ref 70–99)
Potassium: 4.1 mmol/L (ref 3.5–5.2)
Sodium: 143 mmol/L (ref 134–144)
Total Protein: 7.6 g/dL (ref 6.0–8.5)
eGFR: 91 mL/min/{1.73_m2} (ref >59)

## 2022-08-24 LAB — LIPID PANEL W/O CHOL/HDL RATIO
Cholesterol, Total: 226 mg/dL — ABNORMAL HIGH (ref 100–199)
HDL: 57 mg/dL (ref >39)
LDL Chol Calc (NIH): 156 mg/dL — ABNORMAL HIGH (ref 0–99)
Triglycerides: 75 mg/dL (ref 0–149)
VLDL Cholesterol Cal: 13 mg/dL (ref 5–40)

## 2022-08-24 LAB — TSH: TSH: 0.868 u[IU]/mL (ref 0.450–4.500)

## 2022-08-24 LAB — HEMOGLOBIN A1C
Est. average glucose Bld gHb Est-mCnc: 137 mg/dL
Hgb A1c MFr Bld: 6.4 % — ABNORMAL HIGH (ref 4.8–5.6)

## 2022-08-24 NOTE — Patient Instructions (Signed)
Access Code: [email or web address] URL: [email or web address] Date: 08/24/2022 Prepared by: Hilda Blades  Exercises - Supine Bridge  - 1 x daily - 7 x weekly - 2 sets - 10 reps - Supine Posterior Pelvic Tilt  - 1 x daily - 7 x weekly - 2 sets - 10 reps - Hooklying Clamshell with Resistance  - 1 x daily - 7 x weekly - 2 sets - 10 reps - Supine Active Straight Leg Raise  - 1 x daily - 7 x weekly - 2 sets - 10 reps - Sit to Stand  - 1 x daily - 3 sets - 5 reps - Standing Tandem Balance with Counter Support  - 1 x daily - 3 sets - 30 seconds hold

## 2022-08-24 NOTE — Therapy (Signed)
OUTPATIENT PHYSICAL THERAPY TREATMENT NOTE   Patient Name: Roberta Clark MRN: [id number] DOB:10/20/1960, 62 y.o., female Today's Date: 08/24/2022  PCP: Mack Hook, MD REFERRING PROVIDER: Delrae Rend, MD   END OF SESSION:   PT End of Session - 08/24/22 1454     Visit Number 2    Number of Visits 7    Date for PT Re-Evaluation 10/01/22    Authorization Type [employer] and [employer]    Authorization Time Period 08/17/2022 - 10/15/2022    Authorization - Visit Number 1    Authorization - Number of Visits 8    Progress Note Due on Visit 10    PT Start Time 6314    PT Stop Time 1526    PT Time Calculation (min) 39 min    Activity Tolerance Patient tolerated treatment well    Behavior During Therapy [facility] for tasks assessed/performed             Past Medical History:  Diagnosis Date   Adenomatous colon polyp 05/13/2013   Anemia    Arthritis    Chronic pain    Diabetes mellitus without complication ([place]) 9702   Type II - diet controlled   Family history of adverse reaction to anesthesia    mother - nausea   Hyperlipidemia 2011   Rash with statin:  not well controlled with Gemfibrozil   Hypertension 2011   Insomnia secondary to chronic pain    Low back pain with left-sided sciatica    Reportedly with lumbar spinal stenosis   Past Surgical History:  Procedure Laterality Date   BACK SURGERY     COLONOSCOPY     LUMBAR LAMINECTOMY/DECOMPRESSION MICRODISCECTOMY Bilateral 05/27/2016   Procedure: Left Lumbar Four-Five Laminectomy and Foraminotomy;  Surgeon: Eustace Moore, MD;  Location: [place];  Service: Neurosurgery;  Laterality: Bilateral;   SPINAL CORD STIMULATOR INSERTION N/A 07/01/2019   Procedure: Spinal cord stimulator via Thoracic Nine laminectomy;  Surgeon: Eustace Moore, MD;  Location: [place];  Service: Neurosurgery;  Laterality: N/A;  Spinal cord stimulator via Thoracic Nine laminectomy   TOTAL HIP ARTHROPLASTY Left 10/24/2016   Procedure: LEFT TOTAL HIP ARTHROPLASTY  ANTERIOR APPROACH;  Surgeon: Gaynelle Arabian, MD;  Location: [facility];  Service: Orthopedics;  Laterality: Left;   TOTAL HIP ARTHROPLASTY Right 02/22/2017   Procedure: RIGHT TOTAL HIP ARTHROPLASTY ANTERIOR APPROACH;  Surgeon: Gaynelle Arabian, MD;  Location: [facility];  Service: Orthopedics;  Laterality: Right;   TUBAL LIGATION  1985   Patient Active Problem List   Diagnosis Date Noted   Carpal tunnel syndrome 10/11/2021   Paresthesia of hand 10/11/2021   Osteoporosis without current pathological fracture 06/15/2021   Pain of left hip joint 03/22/2021   S/P insertion of spinal cord stimulator 07/01/2019   Low back pain with left-sided sciatica    Long term use of drug 01/24/2019   Foot pain, bilateral 12/04/2018   Trochanteric bursitis of left hip 08/29/2018   Numbness of lower limb 12/11/2017   Inflammation of sacroiliac joint ([place]) 11/30/2017   Lumbosacral spondylosis without myelopathy 11/30/2017   Avascular necrosis of bone of hip ([place]) 08/24/2017   Avascular necrosis of femoral head, right ([place]) 02/22/2017   Avascular necrosis of femoral head, left ([place]) 10/24/2016   OA (osteoarthritis) of hip 10/24/2016   S/P lumbar laminectomy 05/27/2016   Type 2 diabetes mellitus without complication, without long-term current use of insulin ([place]) 11/03/2015   Multilevel degenerative disc disease 10/22/2014   Chronic back pain 05/27/2014  Lumbar radiculopathy, chronic 06/14/2013   Adenomatous colon polyp 05/13/2013   Vaginal discharge 08/22/2012   Primary hypertension 07/25/2012   Preventative health care 07/25/2012   Hot flash, menopausal 07/25/2012   Chronic low back pain 07/25/2012   Fibroids 09/02/2011   Tobacco dependence 09/02/2011   Mixed hyperlipidemia 08/15/2009    REFERRING DIAG: Age-related osteoporosis without current pathological fracture   THERAPY DIAG:  Muscle weakness (generalized)  Age-related osteoporosis without current pathological fracture  Other low back  pain  Rationale for Evaluation and Treatment Rehabilitation  PERTINENT HISTORY:  Lumbar laminectomy/decompression 2017 Spinal cord stimulator 2020 Bilateral THA 2018 Diabetes  Osteoporosis   PRECAUTIONS: None    SUBJECTIVE:          SUBJECTIVE STATEMENT:  Patient reports she is hurting today.  PAIN:  Are you having pain? Yes:  NPRS scale: 9/10 Pain location: back Pain description: sharp Aggravating factors: walking, standing, stairs Relieving factors: unknown   OBJECTIVE: (objective measures completed at initial evaluation unless otherwise dated) POSTURE:  Rounded shoulders and forward head   MMT: MMT Right eval Left eval  Hip flexion 4 4  Hip extension 4 4  Hip abduction 4 4  Hip adduction      Hip internal rotation      Hip external rotation      Knee flexion 5 5  Knee extension 5 5  Shoulder Flexion  4 4  Shoulder Abduction 4 4   (Blank rows = not tested)  FUNCTIONAL TESTS:  5 x STS 14 seconds  Functional lifting: excessive trunk flexion, poor hip hinge  Squat: limited depth, LOB, valgus collapse    GAIT: Distance walked: 10 ft Assistive device utilized: None Level of assistance: Complete Independence Comments: WNL     TODAY'S TREATMENT: [facility] Adult PT Treatment:                                                DATE: 08/24/2022 Therapeutic Exercise: NuStep L5 x 5 min with UE/LE while taking subjective Bridge 2 x 10 SLR 2 x 10 each Sidelying hip abduction 2 x 10 each Sit to stand 3 x 5 LAQ 2 x 10 Standing heel raises 2 x 10 Forward 6" step-up 2 x 5 each Tandem stance 3 x 30 sec each    PATIENT EDUCATION:  Education details: HEP update Person educated: Patient Education method: Explanation, Demonstration, Tactile cues, Verbal cues, Handout Education comprehension: verbalized understanding, returned demonstration, verbal cues required, tactile cues required, and needs further education   HOME EXERCISE PROGRAM: Access Code: [email or web address]     ASSESSMENT: CLINICAL IMPRESSION: Patient with moderate tolerance for therapy, no adverse effects reported. She was able to complete all prescribed exercises but did report increased pain and required extended rest breaks. Therapy focused primarily on progressing strength with addition of weight bearing exercises, and incorporated some balance training to reduce fall risk with osteoporosis. Updated her HEP to progress her leg strength and balance. Patient would benefit from continued skilled PT to progress her mobility and strength in order to reduce pain and maximize functional ability.    OBJECTIVE IMPAIRMENTS: decreased activity tolerance, decreased endurance, decreased knowledge of condition, difficulty walking, decreased strength, improper body mechanics, postural dysfunction, and pain.    ACTIVITY LIMITATIONS: carrying, lifting, bending, standing, squatting, stairs, reach over head, and locomotion level   PARTICIPATION LIMITATIONS: meal prep, cleaning,  laundry, shopping, community activity, and occupation   PERSONAL FACTORS: Age, Fitness, Profession, Time since onset of injury/illness/exacerbation, and 3+ comorbidities: see PMH above  are also affecting patient's functional outcome.      GOALS: Goals reviewed with patient? Yes   SHORT TERM GOALS: = LTG    LONG TERM GOALS: Target date: 09/28/2022   Patient will be independent with advanced home program to assist in management of her chronic condition.  Baseline: initial HEP issued  Goal status: INITIAL   2.  Patient will be able to lift 10 lbs from the floor to waist height with proper form.  Baseline: see above  Goal status: INITIAL   3.  Patient will demonstrate 5/5 bilateral hip strength to improve stability with prolonged walking/standing activity.  Baseline: see above  Goal status: INITIAL   4.  Patient will demonstrate 5/5 bilateral shoulder flexion/abduction strength to improve ability to lift objects overhead.   Baseline: see above  Goal status: INITIAL       PLAN: PT FREQUENCY: 1x/week   PT DURATION: other: 4-6 weeks   PLANNED INTERVENTIONS: Therapeutic exercises, Therapeutic activity, Neuromuscular re-education, Balance training, Gait training, Patient/Family education, Self Care, Stair training, Cryotherapy, Moist heat, Manual therapy, and Re-evaluation   PLAN FOR NEXT SESSION: review and progress HEP prn; bending/lifting/posture education with handout. Progress strengthening as tolerated.    Hilda Blades, PT, DPT, LAT, ATC 08/24/22  3:28 PM Phone: [phone] Fax: [phone]

## 2022-08-25 ENCOUNTER — Encounter: Payer: Self-pay | Admitting: Internal Medicine

## 2022-08-31 ENCOUNTER — Ambulatory Visit: Payer: Medicare HMO

## 2022-08-31 DIAGNOSIS — M5459 Other low back pain: Secondary | ICD-10-CM

## 2022-08-31 DIAGNOSIS — M6281 Muscle weakness (generalized): Secondary | ICD-10-CM | POA: Diagnosis not present

## 2022-08-31 DIAGNOSIS — M810 Age-related osteoporosis without current pathological fracture: Secondary | ICD-10-CM

## 2022-08-31 NOTE — Therapy (Signed)
OUTPATIENT PHYSICAL THERAPY TREATMENT NOTE   Patient Name: Roberta Clark MRN: [id number] DOB:[birth year], 62 y.o., female Today's Date: 08/31/2022  PCP: Mack Hook, MD REFERRING PROVIDER: Delrae Rend, MD   END OF SESSION:   PT End of Session - 08/31/22 1529     Visit Number 3    Number of Visits 7    Date for PT Re-Evaluation 10/01/22    Authorization Type [employer] and [employer]    Authorization Time Period 08/17/2022 - 10/15/2022    Authorization - Visit Number 2    Authorization - Number of Visits 8    Progress Note Due on Visit 10    PT Start Time 5361    PT Stop Time 1611    PT Time Calculation (min) 40 min    Activity Tolerance Patient tolerated treatment well    Behavior During Therapy [facility] for tasks assessed/performed              Past Medical History:  Diagnosis Date   Adenomatous colon polyp 05/13/2013   Anemia    Arthritis    Chronic pain    Diabetes mellitus without complication ([place]) 4431   Type II - diet controlled   Family history of adverse reaction to anesthesia    mother - nausea   Hyperlipidemia 2011   Rash with statin:  not well controlled with Gemfibrozil   Hypertension 2011   Insomnia secondary to chronic pain    Low back pain with left-sided sciatica    Reportedly with lumbar spinal stenosis   Past Surgical History:  Procedure Laterality Date   BACK SURGERY     COLONOSCOPY     LUMBAR LAMINECTOMY/DECOMPRESSION MICRODISCECTOMY Bilateral 05/27/2016   Procedure: Left Lumbar Four-Five Laminectomy and Foraminotomy;  Surgeon: Eustace Moore, MD;  Location: [place];  Service: Neurosurgery;  Laterality: Bilateral;   SPINAL CORD STIMULATOR INSERTION N/A 07/01/2019   Procedure: Spinal cord stimulator via Thoracic Nine laminectomy;  Surgeon: Eustace Moore, MD;  Location: [place];  Service: Neurosurgery;  Laterality: N/A;  Spinal cord stimulator via Thoracic Nine laminectomy   TOTAL HIP ARTHROPLASTY Left 10/24/2016   Procedure: LEFT TOTAL HIP ARTHROPLASTY  ANTERIOR APPROACH;  Surgeon: Gaynelle Arabian, MD;  [facility];  Service: Orthopedics;  Laterality: Left;   TOTAL HIP ARTHROPLASTY Right 02/22/2017   Procedure: RIGHT TOTAL HIP ARTHROPLASTY ANTERIOR APPROACH;  Surgeon: Gaynelle Arabian, MD;  Location: [facility];  Service: Orthopedics;  Laterality: Right;   TUBAL LIGATION  1985   Patient Active Problem List   Diagnosis Date Noted   Carpal tunnel syndrome 10/11/2021   Paresthesia of hand 10/11/2021   Osteoporosis without current pathological fracture 06/15/2021   Pain of left hip joint 03/22/2021   S/P insertion of spinal cord stimulator 07/01/2019   Low back pain with left-sided sciatica    Long term use of drug 01/24/2019   Foot pain, bilateral 12/04/2018   Trochanteric bursitis of left hip 08/29/2018   Numbness of lower limb 12/11/2017   Inflammation of sacroiliac joint ([place]) 11/30/2017   Lumbosacral spondylosis without myelopathy 11/30/2017   Avascular necrosis of bone of hip ([place]) 08/24/2017   Avascular necrosis of femoral head, right ([place]) 02/22/2017   Avascular necrosis of femoral head, left ([place]) 10/24/2016   OA (osteoarthritis) of hip 10/24/2016   S/P lumbar laminectomy 05/27/2016   Type 2 diabetes mellitus without complication, without long-term current use of insulin ([place]) 11/03/2015   Multilevel degenerative disc disease 10/22/2014   Chronic back pain 05/27/2014  Lumbar radiculopathy, chronic 06/14/2013   Adenomatous colon polyp 05/13/2013   Vaginal discharge 08/22/2012   Primary hypertension 07/25/2012   Preventative health care 07/25/2012   Hot flash, menopausal 07/25/2012   Chronic low back pain 07/25/2012   Fibroids 09/02/2011   Tobacco dependence 09/02/2011   Mixed hyperlipidemia 08/15/2009    REFERRING DIAG: Age-related osteoporosis without current pathological fracture   THERAPY DIAG:  Muscle weakness (generalized)  Age-related osteoporosis without current pathological fracture  Other low back  pain  Rationale for Evaluation and Treatment Rehabilitation  PERTINENT HISTORY:  Lumbar laminectomy/decompression 2017 Spinal cord stimulator 2020 Bilateral THA 2018 Diabetes  Osteoporosis   PRECAUTIONS: None    SUBJECTIVE:          SUBJECTIVE STATEMENT:  Patient reports she is hurting. She reports compliance with HEP.   PAIN:  Are you having pain? Yes:  NPRS scale: 9/10 Pain location: back,hips, shoulders  Pain description: sharp Aggravating factors: walking, standing, stairs Relieving factors: unknown   OBJECTIVE: (objective measures completed at initial evaluation unless otherwise dated) POSTURE:  Rounded shoulders and forward head   MMT: MMT Right eval Left eval  Hip flexion 4 4  Hip extension 4 4  Hip abduction 4 4  Hip adduction      Hip internal rotation      Hip external rotation      Knee flexion 5 5  Knee extension 5 5  Shoulder Flexion  4 4  Shoulder Abduction 4 4   (Blank rows = not tested)  FUNCTIONAL TESTS:  5 x STS 14 seconds  Functional lifting: excessive trunk flexion, poor hip hinge  Squat: limited depth, LOB, valgus collapse    GAIT: Distance walked: 10 ft Assistive device utilized: None Level of assistance: Complete Independence Comments: WNL     TODAY'S TREATMENT: [facility] Adult PT Treatment:                                                DATE: 08/31/22 Therapeutic Exercise: NuStep level 5 x 5 minutes UE/LE  Sit to stand 2 x 10  Standing hip abduction 2 x 10 Standing hip extension 2 x 10  Standing shoulder flexion 2 x 10 @ 2 lbs  Standing shoulder abduction 2 x 10 @ 2 lbs  Squat with 5 lb kettlebell to 8 inch step d/c due to poor form Wall squats 2 x 10  Resisted knee extension 2 x 10 @ 15 lbs  Resisted HS curl 2 x 10 @ 15 lbs  Curl to overhead press 2 x 10 @ 2# Updated HEP     [facility] Adult PT Treatment:                                                DATE: 08/24/2022 Therapeutic Exercise: NuStep L5 x 5 min with UE/LE while  taking subjective Bridge 2 x 10 SLR 2 x 10 each Sidelying hip abduction 2 x 10 each Sit to stand 3 x 5 LAQ 2 x 10 Standing heel raises 2 x 10 Forward 6" step-up 2 x 5 each Tandem stance 3 x 30 sec each    PATIENT EDUCATION:  Education details: HEP update Person educated: Patient Education method: Explanation, Demonstration, Tactile cues,  Verbal cues, Handout Education comprehension: verbalized understanding, returned demonstration, verbal cues required, tactile cues required, and needs further education   HOME EXERCISE PROGRAM: Access Code: [email or web address]    ASSESSMENT: CLINICAL IMPRESSION: Patient arrives with high pain levels, but in NAD. Continued to work on generalized strengthening with progression of weight-bearing activity and introduction to [place] UE strengthening. Introduced functional lifting with patient having difficulty controlling for excessive anterior tibial translation and maintaining stability with squatting activity. Better form with wall squats noted. HEP updated to include further strengthening. She reported a reduction in her pain at conclusion of session rated as a 7/10.     OBJECTIVE IMPAIRMENTS: decreased activity tolerance, decreased endurance, decreased knowledge of condition, difficulty walking, decreased strength, improper body mechanics, postural dysfunction, and pain.    ACTIVITY LIMITATIONS: carrying, lifting, bending, standing, squatting, stairs, reach over head, and locomotion level   PARTICIPATION LIMITATIONS: meal prep, cleaning, laundry, shopping, community activity, and occupation   PERSONAL FACTORS: Age, Fitness, Profession, Time since onset of injury/illness/exacerbation, and 3+ comorbidities: see PMH above  are also affecting patient's functional outcome.      GOALS: Goals reviewed with patient? Yes   SHORT TERM GOALS: = LTG    LONG TERM GOALS: Target date: 09/28/2022   Patient will be independent with advanced home program to assist in  management of her chronic condition.  Baseline: initial HEP issued  Goal status: INITIAL   2.  Patient will be able to lift 10 lbs from the floor to waist height with proper form.  Baseline: see above  Goal status: INITIAL   3.  Patient will demonstrate 5/5 bilateral hip strength to improve stability with prolonged walking/standing activity.  Baseline: see above  Goal status: INITIAL   4.  Patient will demonstrate 5/5 bilateral shoulder flexion/abduction strength to improve ability to lift objects overhead.  Baseline: see above  Goal status: INITIAL       PLAN: PT FREQUENCY: 1x/week   PT DURATION: other: 4-6 weeks   PLANNED INTERVENTIONS: Therapeutic exercises, Therapeutic activity, Neuromuscular re-education, Balance training, Gait training, Patient/Family education, Self Care, Stair training, Cryotherapy, Moist heat, Manual therapy, and Re-evaluation   PLAN FOR NEXT SESSION: review and progress HEP prn; bending/lifting/posture education with handout. Progress strengthening as tolerated.   Gwendolyn Grant, PT, DPT, ATC 08/31/22 4:13 PM

## 2022-09-07 ENCOUNTER — Ambulatory Visit: Payer: Medicare HMO

## 2022-09-07 DIAGNOSIS — M6281 Muscle weakness (generalized): Secondary | ICD-10-CM

## 2022-09-07 DIAGNOSIS — M5459 Other low back pain: Secondary | ICD-10-CM

## 2022-09-07 DIAGNOSIS — M810 Age-related osteoporosis without current pathological fracture: Secondary | ICD-10-CM

## 2022-09-07 NOTE — Therapy (Signed)
OUTPATIENT PHYSICAL THERAPY TREATMENT NOTE   Patient Name: Roberta Clark MRN: [id number] DOB:07/24/1961, 62 y.o., female Today's Date: 09/07/2022  PCP: Mack Hook, MD REFERRING PROVIDER: Delrae Rend, MD   END OF SESSION:   PT End of Session - 09/07/22 1516     Visit Number 4    Number of Visits 7    Date for PT Re-Evaluation 10/01/22    Authorization Type [employer] and [employer]    Authorization Time Period 08/17/2022 - 10/15/2022    Authorization - Visit Number 3    Authorization - Number of Visits 8    Progress Note Due on Visit 10    PT Start Time 1529    PT Stop Time 1610    PT Time Calculation (min) 41 min    Activity Tolerance Patient tolerated treatment well    Behavior During Therapy [facility] for tasks assessed/performed               Past Medical History:  Diagnosis Date   Adenomatous colon polyp 05/13/2013   Anemia    Arthritis    Chronic pain    Diabetes mellitus without complication ([place]) 3212   Type II - diet controlled   Family history of adverse reaction to anesthesia    mother - nausea   Hyperlipidemia 2011   Rash with statin:  not well controlled with Gemfibrozil   Hypertension 2011   Insomnia secondary to chronic pain    Low back pain with left-sided sciatica    Reportedly with lumbar spinal stenosis   Past Surgical History:  Procedure Laterality Date   BACK SURGERY     COLONOSCOPY     LUMBAR LAMINECTOMY/DECOMPRESSION MICRODISCECTOMY Bilateral 05/27/2016   Procedure: Left Lumbar Four-Five Laminectomy and Foraminotomy;  Surgeon: Eustace Moore, MD;  Location: [place];  Service: Neurosurgery;  Laterality: Bilateral;   SPINAL CORD STIMULATOR INSERTION N/A 07/01/2019   Procedure: Spinal cord stimulator via Thoracic Nine laminectomy;  Surgeon: Eustace Moore, MD;  Location: [place];  Service: Neurosurgery;  Laterality: N/A;  Spinal cord stimulator via Thoracic Nine laminectomy   TOTAL HIP ARTHROPLASTY Left 10/24/2016   Procedure: LEFT TOTAL HIP  ARTHROPLASTY ANTERIOR APPROACH;  Surgeon: Gaynelle Arabian, MD;  [facility];  Service: Orthopedics;  Laterality: Left;   TOTAL HIP ARTHROPLASTY Right 02/22/2017   Procedure: RIGHT TOTAL HIP ARTHROPLASTY ANTERIOR APPROACH;  Surgeon: Gaynelle Arabian, MD;  [facility];  Service: Orthopedics;  Laterality: Right;   TUBAL LIGATION  1985   Patient Active Problem List   Diagnosis Date Noted   Carpal tunnel syndrome 10/11/2021   Paresthesia of hand 10/11/2021   Osteoporosis without current pathological fracture 06/15/2021   Pain of left hip joint 03/22/2021   S/P insertion of spinal cord stimulator 07/01/2019   Low back pain with left-sided sciatica    Long term use of drug 01/24/2019   Foot pain, bilateral 12/04/2018   Trochanteric bursitis of left hip 08/29/2018   Numbness of lower limb 12/11/2017   Inflammation of sacroiliac joint ([place]) 11/30/2017   Lumbosacral spondylosis without myelopathy 11/30/2017   Avascular necrosis of bone of hip ([place]) 08/24/2017   Avascular necrosis of femoral head, right ([place]) 02/22/2017   Avascular necrosis of femoral head, left ([place]) 10/24/2016   OA (osteoarthritis) of hip 10/24/2016   S/P lumbar laminectomy 05/27/2016   Type 2 diabetes mellitus without complication, without long-term current use of insulin ([place]) 11/03/2015   Multilevel degenerative disc disease 10/22/2014   Chronic back pain 05/27/2014  Lumbar radiculopathy, chronic 06/14/2013   Adenomatous colon polyp 05/13/2013   Vaginal discharge 08/22/2012   Primary hypertension 07/25/2012   Preventative health care 07/25/2012   Hot flash, menopausal 07/25/2012   Chronic low back pain 07/25/2012   Fibroids 09/02/2011   Tobacco dependence 09/02/2011   Mixed hyperlipidemia 08/15/2009    REFERRING DIAG: Age-related osteoporosis without current pathological fracture   THERAPY DIAG:  Muscle weakness (generalized)  Age-related osteoporosis without current pathological fracture  Other low back  pain  Rationale for Evaluation and Treatment Rehabilitation  PERTINENT HISTORY:  Lumbar laminectomy/decompression 2017 Spinal cord stimulator 2020 Bilateral THA 2018 Diabetes  Osteoporosis   PRECAUTIONS: None    SUBJECTIVE:          SUBJECTIVE STATEMENT:  "It's not a good day. I'm having spasms in my back." She reached out to her doctor about this pain and is scheduled for an injection on 2/7.   PAIN:  Are you having pain? Yes:  NPRS scale: "9+"/10 Pain location: back Pain description: spasm, continuous  Aggravating factors: walking, standing, stairs Relieving factors: unknown   OBJECTIVE: (objective measures completed at initial evaluation unless otherwise dated) POSTURE:  Rounded shoulders and forward head   MMT: MMT Right eval Left eval  Hip flexion 4 4  Hip extension 4 4  Hip abduction 4 4  Hip adduction      Hip internal rotation      Hip external rotation      Knee flexion 5 5  Knee extension 5 5  Shoulder Flexion  4 4  Shoulder Abduction 4 4   (Blank rows = not tested)  FUNCTIONAL TESTS:  5 x STS 14 seconds  Functional lifting: excessive trunk flexion, poor hip hinge  Squat: limited depth, LOB, valgus collapse    GAIT: Distance walked: 10 ft Assistive device utilized: None Level of assistance: Complete Independence Comments: WNL     TODAY'S TREATMENT: [facility] Adult PT Treatment:                                                DATE: 09/07/22 Therapeutic Exercise: NuStep level 5 x 5 minutes UE/LE  SKTC x 5 each; 5 sec hold  LTR x 1 min Seated hip hinge with dowel  2 x 10  Standing resisted shoulder flexion red band 2 x 10  Standing resisted shoulder abduction red band 2 x 10  Sit to stand 5 lb kettlebell 1 x 10 Supine pelvic tilts x 5; d/c due to pain  Sidelying hip abduction 2  x 10  Updated HEP     [facility] Adult PT Treatment:                                                DATE: 08/31/22 Therapeutic Exercise: NuStep level 5 x 5 minutes UE/LE   Sit to stand 2 x 10  Standing hip abduction 2 x 10 Standing hip extension 2 x 10  Standing shoulder flexion 2 x 10 @ 2 lbs  Standing shoulder abduction 2 x 10 @ 2 lbs  Squat with 5 lb kettlebell to 8 inch step d/c due to poor form Wall squats 2 x 10  Resisted knee extension 2 x 10 @ 15 lbs  Resisted HS curl 2 x 10 @ 15 lbs  Curl to overhead press 2 x 10 @ 2# Updated HEP     [facility] Adult PT Treatment:                                                DATE: 08/24/2022 Therapeutic Exercise: NuStep L5 x 5 min with UE/LE while taking subjective Bridge 2 x 10 SLR 2 x 10 each Sidelying hip abduction 2 x 10 each Sit to stand 3 x 5 LAQ 2 x 10 Standing heel raises 2 x 10 Forward 6" step-up 2 x 5 each Tandem stance 3 x 30 sec each    PATIENT EDUCATION:  Education details: HEP update Person educated: Patient Education method: Explanation, Demonstration, Tactile cues, Verbal cues, Handout Education comprehension: verbalized understanding, returned demonstration, verbal cues required, tactile cues required, and needs further education   HOME EXERCISE PROGRAM: Access Code: [email or web address]    ASSESSMENT: CLINICAL IMPRESSION: Patient arrives with high pain levels about the back stating she has been experiencing muscle spasms. Despite her high pain levels she tolerated progression of strengthening and lifting activities well, with the exception of supine pelvic tilts as this caused an increase in pain. She demonstrates good form with seated hip hinge. HEP was updated to include shoulder strengthening.     OBJECTIVE IMPAIRMENTS: decreased activity tolerance, decreased endurance, decreased knowledge of condition, difficulty walking, decreased strength, improper body mechanics, postural dysfunction, and pain.    ACTIVITY LIMITATIONS: carrying, lifting, bending, standing, squatting, stairs, reach over head, and locomotion level   PARTICIPATION LIMITATIONS: meal prep, cleaning, laundry, shopping,  community activity, and occupation   PERSONAL FACTORS: Age, Fitness, Profession, Time since onset of injury/illness/exacerbation, and 3+ comorbidities: see PMH above  are also affecting patient's functional outcome.      GOALS: Goals reviewed with patient? Yes   SHORT TERM GOALS: = LTG    LONG TERM GOALS: Target date: 09/28/2022   Patient will be independent with advanced home program to assist in management of her chronic condition.  Baseline: initial HEP issued  Goal status: INITIAL   2.  Patient will be able to lift 10 lbs from the floor to waist height with proper form.  Baseline: see above  Goal status: INITIAL   3.  Patient will demonstrate 5/5 bilateral hip strength to improve stability with prolonged walking/standing activity.  Baseline: see above  Goal status: INITIAL   4.  Patient will demonstrate 5/5 bilateral shoulder flexion/abduction strength to improve ability to lift objects overhead.  Baseline: see above  Goal status: INITIAL       PLAN: PT FREQUENCY: 1x/week   PT DURATION: other: 4-6 weeks   PLANNED INTERVENTIONS: Therapeutic exercises, Therapeutic activity, Neuromuscular re-education, Balance training, Gait training, Patient/Family education, Self Care, Stair training, Cryotherapy, Moist heat, Manual therapy, and Re-evaluation   PLAN FOR NEXT SESSION: review and progress HEP prn; bending/lifting/posture education with handout. Progress strengthening as tolerated.   Gwendolyn Grant, PT, DPT, ATC 09/07/22 4:10 PM

## 2022-09-13 ENCOUNTER — Other Ambulatory Visit: Payer: Self-pay | Admitting: Neurological Surgery

## 2022-09-13 DIAGNOSIS — M961 Postlaminectomy syndrome, not elsewhere classified: Secondary | ICD-10-CM

## 2022-09-14 ENCOUNTER — Ambulatory Visit: Payer: Medicare HMO

## 2022-09-14 DIAGNOSIS — M6281 Muscle weakness (generalized): Secondary | ICD-10-CM

## 2022-09-14 DIAGNOSIS — M810 Age-related osteoporosis without current pathological fracture: Secondary | ICD-10-CM

## 2022-09-14 DIAGNOSIS — M5459 Other low back pain: Secondary | ICD-10-CM

## 2022-09-14 NOTE — Therapy (Signed)
OUTPATIENT PHYSICAL THERAPY TREATMENT NOTE   Patient Name: Roberta Clark MRN: [id number] DOB:[birth year], 62 y.o., female Today's Date: 09/14/2022  PCP: Mack Hook, MD REFERRING PROVIDER: Delrae Rend, MD   END OF SESSION:   PT End of Session - 09/14/22 1524     Visit Number 5    Number of Visits 7    Date for PT Re-Evaluation 10/01/22    Authorization Type [employer] and [employer]    Authorization Time Period 08/17/2022 - 10/15/2022    Authorization - Visit Number 4    Authorization - Number of Visits 8    Progress Note Due on Visit 10    PT Start Time 1497    PT Stop Time 1605    PT Time Calculation (min) 40 min    Activity Tolerance Patient tolerated treatment well    Behavior During Therapy [facility] for tasks assessed/performed                Past Medical History:  Diagnosis Date   Adenomatous colon polyp 05/13/2013   Anemia    Arthritis    Chronic pain    Diabetes mellitus without complication ([place]) 0263   Type II - diet controlled   Family history of adverse reaction to anesthesia    mother - nausea   Hyperlipidemia 2011   Rash with statin:  not well controlled with Gemfibrozil   Hypertension 2011   Insomnia secondary to chronic pain    Low back pain with left-sided sciatica    Reportedly with lumbar spinal stenosis   Past Surgical History:  Procedure Laterality Date   BACK SURGERY     COLONOSCOPY     LUMBAR LAMINECTOMY/DECOMPRESSION MICRODISCECTOMY Bilateral 05/27/2016   Procedure: Left Lumbar Four-Five Laminectomy and Foraminotomy;  Surgeon: Eustace Moore, MD;  Location: [place];  Service: Neurosurgery;  Laterality: Bilateral;   SPINAL CORD STIMULATOR INSERTION N/A 07/01/2019   Procedure: Spinal cord stimulator via Thoracic Nine laminectomy;  Surgeon: Eustace Moore, MD;  Location: [place];  Service: Neurosurgery;  Laterality: N/A;  Spinal cord stimulator via Thoracic Nine laminectomy   TOTAL HIP ARTHROPLASTY Left 10/24/2016   Procedure: LEFT TOTAL HIP  ARTHROPLASTY ANTERIOR APPROACH;  Surgeon: Gaynelle Arabian, MD;  Location: [facility];  Service: Orthopedics;  Laterality: Left;   TOTAL HIP ARTHROPLASTY Right 02/22/2017   Procedure: RIGHT TOTAL HIP ARTHROPLASTY ANTERIOR APPROACH;  Surgeon: Gaynelle Arabian, MD;  Location: [facility];  Service: Orthopedics;  Laterality: Right;   TUBAL LIGATION  1985   Patient Active Problem List   Diagnosis Date Noted   Carpal tunnel syndrome 10/11/2021   Paresthesia of hand 10/11/2021   Osteoporosis without current pathological fracture 06/15/2021   Pain of left hip joint 03/22/2021   S/P insertion of spinal cord stimulator 07/01/2019   Low back pain with left-sided sciatica    Long term use of drug 01/24/2019   Foot pain, bilateral 12/04/2018   Trochanteric bursitis of left hip 08/29/2018   Numbness of lower limb 12/11/2017   Inflammation of sacroiliac joint ([place]) 11/30/2017   Lumbosacral spondylosis without myelopathy 11/30/2017   Avascular necrosis of bone of hip ([place]) 08/24/2017   Avascular necrosis of femoral head, right ([place]) 02/22/2017   Avascular necrosis of femoral head, left ([place]) 10/24/2016   OA (osteoarthritis) of hip 10/24/2016   S/P lumbar laminectomy 05/27/2016   Type 2 diabetes mellitus without complication, without long-term current use of insulin ([place]) 11/03/2015   Multilevel degenerative disc disease 10/22/2014   Chronic back pain  05/27/2014   Lumbar radiculopathy, chronic 06/14/2013   Adenomatous colon polyp 05/13/2013   Vaginal discharge 08/22/2012   Primary hypertension 07/25/2012   Preventative health care 07/25/2012   Hot flash, menopausal 07/25/2012   Chronic low back pain 07/25/2012   Fibroids 09/02/2011   Tobacco dependence 09/02/2011   Mixed hyperlipidemia 08/15/2009    REFERRING DIAG: Age-related osteoporosis without current pathological fracture   THERAPY DIAG:  Muscle weakness (generalized)  Age-related osteoporosis without current pathological fracture  Other low back  pain  Rationale for Evaluation and Treatment Rehabilitation  PERTINENT HISTORY:  Lumbar laminectomy/decompression 2017 Spinal cord stimulator 2020 Bilateral THA 2018 Diabetes  Osteoporosis   PRECAUTIONS: None    SUBJECTIVE:          SUBJECTIVE STATEMENT: "I'm a little achy today, but better than last time."   PAIN:  Are you having pain? Yes:  NPRS scale: 8/10 Pain location: back/neck Pain description: ache;burn Aggravating factors: walking, standing, stairs Relieving factors: unknown   OBJECTIVE: (objective measures completed at initial evaluation unless otherwise dated) POSTURE:  Rounded shoulders and forward head   MMT: MMT Right eval Left eval 09/14/22  Hip flexion 4 4   Hip extension 4 4   Hip abduction 4 4   Hip adduction       Hip internal rotation       Hip external rotation       Knee flexion 5 5   Knee extension 5 5   Shoulder Flexion  4 4 4+/5 bilateral  Shoulder Abduction 4 4 5/5 bilateral   (Blank rows = not tested)  FUNCTIONAL TESTS:  5 x STS 14 seconds  Functional lifting: excessive trunk flexion, poor hip hinge  Squat: limited depth, LOB, valgus collapse    GAIT: Distance walked: 10 ft Assistive device utilized: None Level of assistance: Complete Independence Comments: WNL     TODAY'S TREATMENT: [facility] Adult PT Treatment:                                                DATE: 09/14/22 Therapeutic Exercise: NuStep level 5 x 5 minutes UE/LE  Sit to stand 2 x 10; 5 lb kettlebell first set, 10 lb kettlebell second set  Seated hip hinge x 10  Deadlift with 5 lb kettlebell to 8 inch step multiple reps  Standing hip hinge with dowel x 10  Leg press 3 x 10 @ 20 lbs  Wall pushup 2 x 10 Resisted hip flexion cybex 2 x 10; 17.5 lbs      [facility] Adult PT Treatment:                                                DATE: 09/07/22 Therapeutic Exercise: NuStep level 5 x 5 minutes UE/LE  SKTC x 5 each; 5 sec hold  LTR x 1 min Seated hip hinge with dowel   2 x 10  Standing resisted shoulder flexion red band 2 x 10  Standing resisted shoulder abduction red band 2 x 10  Sit to stand 5 lb kettlebell 1 x 10 Supine pelvic tilts x 5; d/c due to pain  Sidelying hip abduction 2  x 10  Updated HEP     [facility] Adult PT  Treatment:                                                DATE: 08/31/22 Therapeutic Exercise: NuStep level 5 x 5 minutes UE/LE  Sit to stand 2 x 10  Standing hip abduction 2 x 10 Standing hip extension 2 x 10  Standing shoulder flexion 2 x 10 @ 2 lbs  Standing shoulder abduction 2 x 10 @ 2 lbs  Squat with 5 lb kettlebell to 8 inch step d/c due to poor form Wall squats 2 x 10  Resisted knee extension 2 x 10 @ 15 lbs  Resisted HS curl 2 x 10 @ 15 lbs  Curl to overhead press 2 x 10 @ 2# Updated HEP       PATIENT EDUCATION:  Education details: HEP review Person educated: Patient Education method: Explanation Education comprehension: verbalized understanding   HOME EXERCISE PROGRAM: Access Code: [email or web address]    ASSESSMENT: CLINICAL IMPRESSION: Patient arrives with high pain levels about the neck and back, but in NAD. Focused on lifting progression working on hip hinge with patient having difficulty performing standing hip hinge without the use of the dowel, having tendency to either excessively flex her knees or her trunk to perform dead lift. Able to introduce machine strengthening today with good tolerance. No changes made to HEP at this time.     OBJECTIVE IMPAIRMENTS: decreased activity tolerance, decreased endurance, decreased knowledge of condition, difficulty walking, decreased strength, improper body mechanics, postural dysfunction, and pain.    ACTIVITY LIMITATIONS: carrying, lifting, bending, standing, squatting, stairs, reach over head, and locomotion level   PARTICIPATION LIMITATIONS: meal prep, cleaning, laundry, shopping, community activity, and occupation   PERSONAL FACTORS: Age, Fitness, Profession, Time since  onset of injury/illness/exacerbation, and 3+ comorbidities: see PMH above  are also affecting patient's functional outcome.      GOALS: Goals reviewed with patient? Yes   SHORT TERM GOALS: = LTG    LONG TERM GOALS: Target date: 09/28/2022   Patient will be independent with advanced home program to assist in management of her chronic condition.  Baseline: initial HEP issued  Goal status: INITIAL   2.  Patient will be able to lift 10 lbs from the floor to waist height with proper form.  Baseline: see above  Goal status: INITIAL   3.  Patient will demonstrate 5/5 bilateral hip strength to improve stability with prolonged walking/standing activity.  Baseline: see above  Goal status: INITIAL   4.  Patient will demonstrate 5/5 bilateral shoulder flexion/abduction strength to improve ability to lift objects overhead.  Baseline: see above  Goal status: INITIAL       PLAN: PT FREQUENCY: 1x/week   PT DURATION: other: 4-6 weeks   PLANNED INTERVENTIONS: Therapeutic exercises, Therapeutic activity, Neuromuscular re-education, Balance training, Gait training, Patient/Family education, Self Care, Stair training, Cryotherapy, Moist heat, Manual therapy, and Re-evaluation   PLAN FOR NEXT SESSION: review and progress HEP prn; bending/lifting/posture education with handout. Progress strengthening as tolerated.   Gwendolyn Grant, PT, DPT, ATC 09/14/22 4:05 PM

## 2022-09-16 ENCOUNTER — Other Ambulatory Visit: Payer: Self-pay | Admitting: Internal Medicine

## 2022-09-16 DIAGNOSIS — F419 Anxiety disorder, unspecified: Secondary | ICD-10-CM

## 2022-09-16 DIAGNOSIS — F32A Depression, unspecified: Secondary | ICD-10-CM

## 2022-09-21 ENCOUNTER — Ambulatory Visit: Payer: Medicare HMO

## 2022-09-23 ENCOUNTER — Other Ambulatory Visit: Payer: Self-pay | Admitting: Neurological Surgery

## 2022-09-23 DIAGNOSIS — M961 Postlaminectomy syndrome, not elsewhere classified: Secondary | ICD-10-CM

## 2022-09-28 ENCOUNTER — Encounter: Payer: Self-pay | Admitting: Internal Medicine

## 2022-09-28 ENCOUNTER — Telehealth: Payer: Self-pay

## 2022-09-28 ENCOUNTER — Ambulatory Visit: Payer: Medicare HMO | Attending: Internal Medicine

## 2022-09-28 DIAGNOSIS — M5459 Other low back pain: Secondary | ICD-10-CM | POA: Insufficient documentation

## 2022-09-28 DIAGNOSIS — M6281 Muscle weakness (generalized): Secondary | ICD-10-CM | POA: Insufficient documentation

## 2022-09-28 DIAGNOSIS — M810 Age-related osteoporosis without current pathological fracture: Secondary | ICD-10-CM | POA: Insufficient documentation

## 2022-09-28 NOTE — Therapy (Incomplete)
OUTPATIENT PHYSICAL THERAPY TREATMENT NOTE   Patient Name: Roberta Clark MRN: [id number] DOB:02-29-1961, 62 y.o., female Today's Date: 09/28/2022  PCP: Mack Hook, MD REFERRING PROVIDER: Delrae Rend, MD   END OF SESSION:        Past Medical History:  Diagnosis Date   Adenomatous colon polyp 05/13/2013   Anemia    Arthritis    Chronic pain    Diabetes mellitus without complication ([place]) [id number]   Type II - diet controlled   Family history of adverse reaction to anesthesia    mother - nausea   Hyperlipidemia 2011   Rash with statin:  not well controlled with Gemfibrozil   Hypertension 2011   Insomnia secondary to chronic pain    Low back pain with left-sided sciatica    Reportedly with lumbar spinal stenosis   Past Surgical History:  Procedure Laterality Date   BACK SURGERY     COLONOSCOPY     LUMBAR LAMINECTOMY/DECOMPRESSION MICRODISCECTOMY Bilateral 05/27/2016   Procedure: Left Lumbar Four-Five Laminectomy and Foraminotomy;  Surgeon: Eustace Moore, MD;  Location: [place];  Service: Neurosurgery;  Laterality: Bilateral;   SPINAL CORD STIMULATOR INSERTION N/A 07/01/2019   Procedure: Spinal cord stimulator via Thoracic Nine laminectomy;  Surgeon: Eustace Moore, MD;  Location: [place];  Service: Neurosurgery;  Laterality: N/A;  Spinal cord stimulator via Thoracic Nine laminectomy   TOTAL HIP ARTHROPLASTY Left 10/24/2016   Procedure: LEFT TOTAL HIP ARTHROPLASTY ANTERIOR APPROACH;  Surgeon: Gaynelle Arabian, MD;  [facility];  Service: Orthopedics;  Laterality: Left;   TOTAL HIP ARTHROPLASTY Right 02/22/2017   Procedure: RIGHT TOTAL HIP ARTHROPLASTY ANTERIOR APPROACH;  Surgeon: Gaynelle Arabian, MD;  [facility];  Service: Orthopedics;  Laterality: Right;   TUBAL LIGATION  1985   Patient Active Problem List   Diagnosis Date Noted   Carpal tunnel syndrome 10/11/2021   Paresthesia of hand 10/11/2021   Osteoporosis without current pathological fracture 06/15/2021    Pain of left hip joint 03/22/2021   S/P insertion of spinal cord stimulator 07/01/2019   Low back pain with left-sided sciatica    Long term use of drug 01/24/2019   Foot pain, bilateral 12/04/2018   Trochanteric bursitis of left hip 08/29/2018   Numbness of lower limb 12/11/2017   Inflammation of sacroiliac joint ([place]) 11/30/2017   Lumbosacral spondylosis without myelopathy 11/30/2017   Avascular necrosis of bone of hip ([place]) 08/24/2017   Avascular necrosis of femoral head, right ([place]) 02/22/2017   Avascular necrosis of femoral head, left ([place]) 10/24/2016   OA (osteoarthritis) of hip 10/24/2016   S/P lumbar laminectomy 05/27/2016   Type 2 diabetes mellitus without complication, without long-term current use of insulin ([place]) 11/03/2015   Multilevel degenerative disc disease 10/22/2014   Chronic back pain 05/27/2014   Lumbar radiculopathy, chronic 06/14/2013   Adenomatous colon polyp 05/13/2013   Vaginal discharge 08/22/2012   Primary hypertension 07/25/2012   Preventative health care 07/25/2012   Hot flash, menopausal 07/25/2012   Chronic low back pain 07/25/2012   Fibroids 09/02/2011   Tobacco dependence 09/02/2011   Mixed hyperlipidemia 08/15/2009    REFERRING DIAG: Age-related osteoporosis without current pathological fracture   THERAPY DIAG:  No diagnosis found.  Rationale for Evaluation and Treatment Rehabilitation  PERTINENT HISTORY:  Lumbar laminectomy/decompression 2017 Spinal cord stimulator 2020 Bilateral THA 2018 Diabetes  Osteoporosis   PRECAUTIONS: None    SUBJECTIVE:          SUBJECTIVE STATEMENT: "I'm a little achy today, but  better than last time."   PAIN:  Are you having pain? Yes:  NPRS scale: 8/10 Pain location: back/neck Pain description: ache;burn Aggravating factors: walking, standing, stairs Relieving factors: unknown   OBJECTIVE: (objective measures completed at initial evaluation unless otherwise dated) POSTURE:  Rounded shoulders  and forward head   MMT: MMT Right eval Left eval 09/14/22  Hip flexion 4 4   Hip extension 4 4   Hip abduction 4 4   Hip adduction       Hip internal rotation       Hip external rotation       Knee flexion 5 5   Knee extension 5 5   Shoulder Flexion  4 4 4+/5 bilateral  Shoulder Abduction 4 4 5/5 bilateral   (Blank rows = not tested)  FUNCTIONAL TESTS:  5 x STS 14 seconds  Functional lifting: excessive trunk flexion, poor hip hinge  Squat: limited depth, LOB, valgus collapse    GAIT: Distance walked: 10 ft Assistive device utilized: None Level of assistance: Complete Independence Comments: WNL     TODAY'S TREATMENT: [facility] Adult PT Treatment:                                                DATE: 09/14/22 Therapeutic Exercise: NuStep level 5 x 5 minutes UE/LE  Sit to stand 2 x 10; 5 lb kettlebell first set, 10 lb kettlebell second set  Seated hip hinge x 10  Deadlift with 5 lb kettlebell to 8 inch step multiple reps  Standing hip hinge with dowel x 10  Leg press 3 x 10 @ 20 lbs  Wall pushup 2 x 10 Resisted hip flexion cybex 2 x 10; 17.5 lbs      [facility] Adult PT Treatment:                                                DATE: 09/07/22 Therapeutic Exercise: NuStep level 5 x 5 minutes UE/LE  SKTC x 5 each; 5 sec hold  LTR x 1 min Seated hip hinge with dowel  2 x 10  Standing resisted shoulder flexion red band 2 x 10  Standing resisted shoulder abduction red band 2 x 10  Sit to stand 5 lb kettlebell 1 x 10 Supine pelvic tilts x 5; d/c due to pain  Sidelying hip abduction 2  x 10  Updated HEP     [facility] Adult PT Treatment:                                                DATE: 08/31/22 Therapeutic Exercise: NuStep level 5 x 5 minutes UE/LE  Sit to stand 2 x 10  Standing hip abduction 2 x 10 Standing hip extension 2 x 10  Standing shoulder flexion 2 x 10 @ 2 lbs  Standing shoulder abduction 2 x 10 @ 2 lbs  Squat with 5 lb kettlebell to 8 inch step d/c due to poor  form Wall squats 2 x 10  Resisted knee extension 2 x 10 @ 15 lbs  Resisted HS curl 2 x 10 @  15 lbs  Curl to overhead press 2 x 10 @ 2# Updated HEP       PATIENT EDUCATION:  Education details: HEP review Person educated: Patient Education method: Explanation Education comprehension: verbalized understanding   HOME EXERCISE PROGRAM: Access Code: [email or web address]    ASSESSMENT: CLINICAL IMPRESSION: Patient arrives with high pain levels about the neck and back, but in NAD. Focused on lifting progression working on hip hinge with patient having difficulty performing standing hip hinge without the use of the dowel, having tendency to either excessively flex her knees or her trunk to perform dead lift. Able to introduce machine strengthening today with good tolerance. No changes made to HEP at this time.     OBJECTIVE IMPAIRMENTS: decreased activity tolerance, decreased endurance, decreased knowledge of condition, difficulty walking, decreased strength, improper body mechanics, postural dysfunction, and pain.    ACTIVITY LIMITATIONS: carrying, lifting, bending, standing, squatting, stairs, reach over head, and locomotion level   PARTICIPATION LIMITATIONS: meal prep, cleaning, laundry, shopping, community activity, and occupation   PERSONAL FACTORS: Age, Fitness, Profession, Time since onset of injury/illness/exacerbation, and 3+ comorbidities: see PMH above  are also affecting patient's functional outcome.      GOALS: Goals reviewed with patient? Yes   SHORT TERM GOALS: = LTG    LONG TERM GOALS: Target date: 09/28/2022   Patient will be independent with advanced home program to assist in management of her chronic condition.  Baseline: initial HEP issued  Goal status: INITIAL   2.  Patient will be able to lift 10 lbs from the floor to waist height with proper form.  Baseline: see above  Goal status: INITIAL   3.  Patient will demonstrate 5/5 bilateral hip strength to improve stability  with prolonged walking/standing activity.  Baseline: see above  Goal status: INITIAL   4.  Patient will demonstrate 5/5 bilateral shoulder flexion/abduction strength to improve ability to lift objects overhead.  Baseline: see above  Goal status: INITIAL       PLAN: PT FREQUENCY: 1x/week   PT DURATION: other: 4-6 weeks   PLANNED INTERVENTIONS: Therapeutic exercises, Therapeutic activity, Neuromuscular re-education, Balance training, Gait training, Patient/Family education, Self Care, Stair training, Cryotherapy, Moist heat, Manual therapy, and Re-evaluation   PLAN FOR NEXT SESSION: review and progress HEP prn; bending/lifting/posture education with handout. Progress strengthening as tolerated.   Gwendolyn Grant, PT, DPT, ATC 09/28/22 1:58 PM

## 2022-09-28 NOTE — Telephone Encounter (Signed)
Left message for pt to call so I can get her scheduled to come in this week.

## 2022-09-28 NOTE — Telephone Encounter (Signed)
She can come in as needed

## 2022-09-29 ENCOUNTER — Ambulatory Visit (INDEPENDENT_AMBULATORY_CARE_PROVIDER_SITE_OTHER): Payer: Medicare HMO | Admitting: Podiatry

## 2022-09-29 ENCOUNTER — Ambulatory Visit (INDEPENDENT_AMBULATORY_CARE_PROVIDER_SITE_OTHER): Payer: Medicare HMO

## 2022-09-29 DIAGNOSIS — M7752 Other enthesopathy of left foot: Secondary | ICD-10-CM

## 2022-09-29 DIAGNOSIS — D169 Benign neoplasm of bone and articular cartilage, unspecified: Secondary | ICD-10-CM

## 2022-09-29 DIAGNOSIS — Z472 Encounter for removal of internal fixation device: Secondary | ICD-10-CM

## 2022-09-29 NOTE — Progress Notes (Signed)
Subjective:   Patient ID: Roberta Clark, female   DOB: 62 y.o.   MRN: [id number]   HPI Patient states she has 2 kn on her left foot that are both bothering her and she states the one is making it almost impossible to wear shoe gear and is just been present recently she has tried wider shoes she tried to soak it   ROS      Objective:  Physical Exam  Neuro vascular status intact with prominence of the proximal portion of the incision site left most likely where the pin was with what appears to be a small bone spur on the cuneiform left that is painful     Assessment:  Probability for abnormal pin position that has occurred recently left first metatarsal along with probable excess ptotic lesion of the cuneiform left     Plan:  H&P x-ray reviewed discussed at great length.  Due to the intense discomfort I recommended pin removal and removal of bone spur left she wants to have this done and I explained procedure risk she understands this and I went ahead today and I allowed her to read then signed consent form going over alternative treatments complications.  Patient scheduled outpatient surgery with all questions answered today  X-rays indicate the area of concern is directly on the proximal pin position left that appears to be very sore

## 2022-10-05 ENCOUNTER — Ambulatory Visit: Payer: Medicare HMO

## 2022-10-05 DIAGNOSIS — M6281 Muscle weakness (generalized): Secondary | ICD-10-CM

## 2022-10-05 DIAGNOSIS — M5459 Other low back pain: Secondary | ICD-10-CM

## 2022-10-05 DIAGNOSIS — M810 Age-related osteoporosis without current pathological fracture: Secondary | ICD-10-CM

## 2022-10-05 NOTE — Therapy (Signed)
OUTPATIENT PHYSICAL THERAPY TREATMENT NOTE/RE-CERTIFICATION PHYSICAL THERAPY DISCHARGE SUMMARY  Visits from Start of Care: 6  Current functional level related to goals / functional outcomes: All goals met   Remaining deficits: Chronic back and hip pain    Education / Equipment: See education below    Patient agrees to discharge. Patient goals were met. Patient is being discharged due to meeting the stated rehab goals.   Patient Name: Roberta Clark MRN: [id number] DOB:1961/07/28, 62 y.o., female Today's Date: 10/05/2022  PCP: Mack Hook, MD REFERRING PROVIDER: Delrae Rend, MD   END OF SESSION:   PT End of Session - 10/05/22 1534     Visit Number 6    Number of Visits 7    Date for PT Re-Evaluation --   n/a d/c   Authorization Type [employer] and [employer]    Authorization Time Period 08/17/2022 - 10/15/2022    Authorization - Visit Number 5    Authorization - Number of Visits 8    Progress Note Due on Visit 10    PT Start Time [id number]    PT Stop Time 1610    PT Time Calculation (min) 40 min    Activity Tolerance Patient tolerated treatment well    Behavior During Therapy [facility] for tasks assessed/performed                 Past Medical History:  Diagnosis Date   Adenomatous colon polyp 05/13/2013   Anemia    Arthritis    Chronic pain    Diabetes mellitus without complication ([place]) [id number]   Type II - diet controlled   Family history of adverse reaction to anesthesia    mother - nausea   Hyperlipidemia 2011   Rash with statin:  not well controlled with Gemfibrozil   Hypertension 2011   Insomnia secondary to chronic pain    Low back pain with left-sided sciatica    Reportedly with lumbar spinal stenosis   Past Surgical History:  Procedure Laterality Date   BACK SURGERY     COLONOSCOPY     LUMBAR LAMINECTOMY/DECOMPRESSION MICRODISCECTOMY Bilateral 05/27/2016   Procedure: Left Lumbar Four-Five Laminectomy and Foraminotomy;  Surgeon: Eustace Moore, MD;  Location:  [place];  Service: Neurosurgery;  Laterality: Bilateral;   SPINAL CORD STIMULATOR INSERTION N/A 07/01/2019   Procedure: Spinal cord stimulator via Thoracic Nine laminectomy;  Surgeon: Eustace Moore, MD;  Location: [place];  Service: Neurosurgery;  Laterality: N/A;  Spinal cord stimulator via Thoracic Nine laminectomy   TOTAL HIP ARTHROPLASTY Left 10/24/2016   Procedure: LEFT TOTAL HIP ARTHROPLASTY ANTERIOR APPROACH;  Surgeon: Gaynelle Arabian, MD;  Location: [facility];  Service: Orthopedics;  Laterality: Left;   TOTAL HIP ARTHROPLASTY Right 02/22/2017   Procedure: RIGHT TOTAL HIP ARTHROPLASTY ANTERIOR APPROACH;  Surgeon: Gaynelle Arabian, MD;  Location: [facility];  Service: Orthopedics;  Laterality: Right;   TUBAL LIGATION  1985   Patient Active Problem List   Diagnosis Date Noted   Carpal tunnel syndrome 10/11/2021   Paresthesia of hand 10/11/2021   Osteoporosis without current pathological fracture 06/15/2021   Pain of left hip joint 03/22/2021   S/P insertion of spinal cord stimulator 07/01/2019   Low back pain with left-sided sciatica    Long term use of drug 01/24/2019   Foot pain, bilateral 12/04/2018   Trochanteric bursitis of left hip 08/29/2018   Numbness of lower limb 12/11/2017   Inflammation of sacroiliac joint ([place]) 11/30/2017   Lumbosacral spondylosis without myelopathy 11/30/2017   Avascular  necrosis of bone of hip ([place]) 08/24/2017   Avascular necrosis of femoral head, right ([place]) 02/22/2017   Avascular necrosis of femoral head, left ([place]) 10/24/2016   OA (osteoarthritis) of hip 10/24/2016   S/P lumbar laminectomy 05/27/2016   Type 2 diabetes mellitus without complication, without long-term current use of insulin ([place]) 11/03/2015   Multilevel degenerative disc disease 10/22/2014   Chronic back pain 05/27/2014   Lumbar radiculopathy, chronic 06/14/2013   Adenomatous colon polyp 05/13/2013   Vaginal discharge 08/22/2012   Primary hypertension 07/25/2012   Preventative health care  07/25/2012   Hot flash, menopausal 07/25/2012   Chronic low back pain 07/25/2012   Fibroids 09/02/2011   Tobacco dependence 09/02/2011   Mixed hyperlipidemia 08/15/2009    REFERRING DIAG: Age-related osteoporosis without current pathological fracture   THERAPY DIAG:  Muscle weakness (generalized)  Age-related osteoporosis without current pathological fracture  Other low back pain  Rationale for Evaluation and Treatment Rehabilitation  PERTINENT HISTORY:  Lumbar laminectomy/decompression 2017 Spinal cord stimulator 2020 Bilateral THA 2018 Diabetes  Osteoporosis   PRECAUTIONS: None    SUBJECTIVE:          SUBJECTIVE STATEMENT: "I'm ok." She reports compliance with HEP.   PAIN:  Are you having pain? Yes:  NPRS scale: 8/10 Pain location: back/hips Pain description: ache;burn Aggravating factors: walking, standing, stairs Relieving factors: unknown   OBJECTIVE: (objective measures completed at initial evaluation unless otherwise dated) POSTURE:  Rounded shoulders and forward head   MMT: MMT Right eval Left eval 09/14/22 10/05/22  Hip flexion 4 4  5/5 bilateral   Hip extension 4 4  5/5 bilateral  Hip abduction 4 4  5/5 bilateral   Hip adduction        Hip internal rotation        Hip external rotation        Knee flexion 5 5    Knee extension 5 5    Shoulder Flexion  4 4 4+/5 bilateral 5/5 bilateral  Shoulder Abduction 4 4 5/5 bilateral 5/5 bilateral    (Blank rows = not tested)  FUNCTIONAL TESTS:  5 x STS 14 seconds  Functional lifting: excessive trunk flexion, poor hip hinge  Squat: limited depth, LOB, valgus collapse    GAIT: Distance walked: 10 ft Assistive device utilized: None Level of assistance: Complete Independence Comments: WNL     TODAY'S TREATMENT: [facility] Adult PT Treatment:                                                DATE: 10/05/22 Therapeutic Exercise: NuStep level 5 x 5 minutes UE/LE  Resisted hip extension and abduction green band  1 x 10  Resisted shoulder flexion green band 1 x 10 each Reviewed and updated HEP discussing frequency, sets, reps and ways to progress independently.   Therapeutic Activity: Re-assessment to determine overall progress, educating patient on progress towards goals  Self Care: Informational handout on osteoporosis  Discussed lifting and bending mechanics and posture with handout provided.    [facility] Adult PT Treatment:                                                DATE: 09/14/22 Therapeutic Exercise: NuStep level 5 x  5 minutes UE/LE  Sit to stand 2 x 10; 5 lb kettlebell first set, 10 lb kettlebell second set  Seated hip hinge x 10  Deadlift with 5 lb kettlebell to 8 inch step multiple reps  Standing hip hinge with dowel x 10  Leg press 3 x 10 @ 20 lbs  Wall pushup 2 x 10 Resisted hip flexion cybex 2 x 10; 17.5 lbs      [facility] Adult PT Treatment:                                                DATE: 09/07/22 Therapeutic Exercise: NuStep level 5 x 5 minutes UE/LE  SKTC x 5 each; 5 sec hold  LTR x 1 min Seated hip hinge with dowel  2 x 10  Standing resisted shoulder flexion red band 2 x 10  Standing resisted shoulder abduction red band 2 x 10  Sit to stand 5 lb kettlebell 1 x 10 Supine pelvic tilts x 5; d/c due to pain  Sidelying hip abduction 2  x 10  Updated HEP         PATIENT EDUCATION:  Education details: see treatment; d/c education Person educated: Patient Education method: Consulting civil engineer, demo, handout Education comprehension: verbalized understanding, returned demo    HOME EXERCISE PROGRAM: Access Code: [email or web address]    ASSESSMENT: CLINICAL IMPRESSION: Meliah has progressed well in PT in regards to strengthening, lifting mechanics, and building a home program she can continue independently for her osteoporosis. She has met all established functional goals demonstrating improvements in hip and shoulder strength. She is independent with advanced home program and is appropriate  for discharge at this time with patient in agreement with this plan.     OBJECTIVE IMPAIRMENTS: decreased activity tolerance, decreased endurance, decreased knowledge of condition, difficulty walking, decreased strength, improper body mechanics, postural dysfunction, and pain.    ACTIVITY LIMITATIONS: carrying, lifting, bending, standing, squatting, stairs, reach over head, and locomotion level   PARTICIPATION LIMITATIONS: meal prep, cleaning, laundry, shopping, community activity, and occupation   PERSONAL FACTORS: Age, Fitness, Profession, Time since onset of injury/illness/exacerbation, and 3+ comorbidities: see PMH above  are also affecting patient's functional outcome.      GOALS: Goals reviewed with patient? Yes   SHORT TERM GOALS: = LTG    LONG TERM GOALS: Target date: 09/28/2022   Patient will be independent with advanced home program to assist in management of her chronic condition.  Baseline: initial HEP issued  Goal status: MET   2.  Patient will be able to lift 10 lbs from the floor to waist height with proper form.  Baseline: see above  10/05/22: proper form with lifting 10 lb kettle bell  Goal status: MET   3.  Patient will demonstrate 5/5 bilateral hip strength to improve stability with prolonged walking/standing activity.  Baseline: see above  Goal status: MET   4.  Patient will demonstrate 5/5 bilateral shoulder flexion/abduction strength to improve ability to lift objects overhead.  Baseline: see above  Goal status: MET       PLAN: PT FREQUENCY: n/a   PT DURATION: n/a   PLANNED INTERVENTIONS: Therapeutic exercises, Therapeutic activity, Neuromuscular re-education, Balance training, Gait training, Patient/Family education, Self Care, Stair training, Cryotherapy, Moist heat, Manual therapy, and Re-evaluation   PLAN FOR NEXT SESSION: n/a  Gwendolyn Grant, PT, DPT, ATC 10/05/22 4:17  PM

## 2022-10-05 NOTE — Patient Instructions (Signed)

## 2022-10-07 ENCOUNTER — Ambulatory Visit
Admission: RE | Admit: 2022-10-07 | Discharge: 2022-10-07 | Disposition: A | Discharge status: Home / Self Care | Payer: Medicaid Other | Source: Ambulatory Visit | Attending: Neurological Surgery | Admitting: Neurological Surgery

## 2022-10-07 ENCOUNTER — Ambulatory Visit
Admission: RE | Admit: 2022-10-07 | Discharge: 2022-10-07 | Disposition: A | Discharge status: Home / Self Care | Payer: Medicare HMO | Source: Ambulatory Visit | Attending: Neurological Surgery | Admitting: Neurological Surgery

## 2022-10-07 ENCOUNTER — Encounter: Payer: Self-pay | Admitting: Internal Medicine

## 2022-10-07 DIAGNOSIS — M961 Postlaminectomy syndrome, not elsewhere classified: Secondary | ICD-10-CM

## 2022-10-07 MED ORDER — ONDANSETRON HCL 4 MG/2ML IJ SOLN: 4.0000 mg | Freq: Once | INTRAMUSCULAR | Status: DC | PRN

## 2022-10-07 MED ORDER — MEPERIDINE HCL 50 MG/ML IJ SOLN: 50.0000 mg | Freq: Once | INTRAMUSCULAR | Status: DC | PRN

## 2022-10-07 MED ORDER — IOPAMIDOL (ISOVUE-M 200) INJECTION 41%
20.0000 mL | Freq: Once | INTRAMUSCULAR | Status: AC
  Administered 2022-10-07: 20 mL via INTRATHECAL

## 2022-10-07 MED ORDER — DIAZEPAM 5 MG PO TABS
5.0000 mg | ORAL_TABLET | Freq: Once | ORAL | Status: AC
  Administered 2022-10-07: 5 mg via ORAL

## 2022-10-07 NOTE — Progress Notes (Signed)
Pt reports her spinal cord stimulator has been turned off for her myelogram procedure.

## 2022-10-07 NOTE — Discharge Instructions (Signed)

## 2022-10-10 ENCOUNTER — Telehealth: Payer: Self-pay | Admitting: Urology

## 2022-10-10 NOTE — Telephone Encounter (Signed)
DOS - 11/08/22  REMOVAL FIXATION LEFT --- 20680 TARSAL EXOSTECTOMY LEFT --- 28104  [facility]  [employer] FOR CPT CODES 46962 AND 95284 Doesn't require authorization.

## 2022-10-19 ENCOUNTER — Other Ambulatory Visit: Payer: Self-pay | Admitting: Internal Medicine

## 2022-11-01 ENCOUNTER — Telehealth: Payer: Self-pay

## 2022-11-01 NOTE — Telephone Encounter (Signed)
Patient called to request refill on ibuprofen 800mg 

## 2022-11-04 ENCOUNTER — Emergency Department (HOSPITAL_COMMUNITY)
Admission: EM | Admit: 2022-11-04 | Discharge: 2022-11-04 | Disposition: A | Discharge status: Home / Self Care | Payer: Medicare HMO | Attending: Emergency Medicine | Admitting: Emergency Medicine

## 2022-11-04 ENCOUNTER — Encounter (HOSPITAL_COMMUNITY): Payer: Self-pay

## 2022-11-04 ENCOUNTER — Emergency Department (HOSPITAL_COMMUNITY): Payer: Medicare HMO

## 2022-11-04 ENCOUNTER — Ambulatory Visit (HOSPITAL_COMMUNITY)
Admission: EM | Admit: 2022-11-04 | Discharge: 2022-11-04 | Disposition: A | Discharge status: Home / Self Care | Payer: Medicare HMO | Attending: Emergency Medicine | Admitting: Emergency Medicine

## 2022-11-04 DIAGNOSIS — M542 Cervicalgia: Secondary | ICD-10-CM | POA: Diagnosis present

## 2022-11-04 DIAGNOSIS — M4802 Spinal stenosis, cervical region: Secondary | ICD-10-CM | POA: Diagnosis not present

## 2022-11-04 DIAGNOSIS — M436 Torticollis: Secondary | ICD-10-CM

## 2022-11-04 MED ORDER — DICLOFENAC SODIUM 75 MG PO TBEC: 75.0000 mg | DELAYED_RELEASE_TABLET | Freq: Two times a day (BID) | ORAL | 2 refills | Status: AC

## 2022-11-04 MED ORDER — METHOCARBAMOL 500 MG PO TABS
500.0000 mg | ORAL_TABLET | Freq: Once | ORAL | Status: AC
  Administered 2022-11-04: 500 mg via ORAL
  Filled 2022-11-04: qty 1

## 2022-11-04 MED ORDER — LIDOCAINE 5 % EX PTCH: 1.0000 | MEDICATED_PATCH | CUTANEOUS | 0 refills | Status: DC

## 2022-11-04 MED ORDER — ACETAMINOPHEN 325 MG PO TABS
ORAL_TABLET | ORAL | Status: AC
  Filled 2022-11-04: qty 2

## 2022-11-04 MED ORDER — ACETAMINOPHEN 325 MG PO TABS
650.0000 mg | ORAL_TABLET | Freq: Once | ORAL | Status: AC
  Administered 2022-11-04: 650 mg via ORAL

## 2022-11-04 MED ORDER — NAPROXEN 250 MG PO TABS
500.0000 mg | ORAL_TABLET | Freq: Once | ORAL | Status: AC
  Administered 2022-11-04: 500 mg via ORAL
  Filled 2022-11-04: qty 2

## 2022-11-04 MED ORDER — METHOCARBAMOL 500 MG PO TABS: 500.0000 mg | ORAL_TABLET | Freq: Three times a day (TID) | ORAL | 0 refills | Status: AC | PRN

## 2022-11-04 NOTE — ED Notes (Signed)
AVS reviewed with pt prior to discharge. Pt verbalizes understanding. Belongings with pt upon depart. Pt ambulatory to lobby to wait for ride.  

## 2022-11-04 NOTE — ED Triage Notes (Signed)
Pt c/o L ear pain radiating down face to L neck; seen earlier at urgent care, sent to ed for further evaluation; denies known injury, pain started yesterday

## 2022-11-04 NOTE — ED Triage Notes (Signed)
Patient cv/o left ear pain and states that the pain radiates down the left face and left lateral neck. Patient states pain was worse when she turns her head. Pain started yesterday.  Patient denies taking anything for pain.

## 2022-11-04 NOTE — ED Notes (Signed)
Patient is being discharged from the Urgent Care and sent to the Emergency Department via POV . Per self, patient is in need of higher level of care due to neck pain and stiffness. Patient is aware and verbalizes understanding of plan of care.  Vitals:   11/04/22 1211 11/04/22 1328  BP: (!) 171/93 (!) 143/82  Pulse: 96 86  Resp: 16 16  Temp: 99.4 F (37.4 C) 98.9 F (37.2 C)  SpO2: 96% 96%

## 2022-11-04 NOTE — ED Provider Notes (Signed)
[facility] Provider Note   CSN: [id number] Arrival date & time: 11/04/22  1347     History No chief complaint on file.   Roberta Clark is a 62 y.o. female with a past medical history of spinal stenosis of the thoracic and lumbar spine who presents today with neck pain.  Says it has been going on since yesterday and she has an inability to rotate her head from side-to-side.  Still able to flex and extend the neck as well as laterally rotate.  No fevers or chills.  No photophobia.  No history of cervical surgery.  She went to urgent care and they sent her to the emergency department  HPI     Home Medications Prior to Admission medications   Medication Sig Start Date End Date Taking? Authorizing Provider  amitriptyline (ELAVIL) 25 MG tablet TAKE ONE TABLET BY MOUTH AT BEDTIME AS NEEDED slep 08/22/22   Mack Hook, MD  BIOTIN PO Take by mouth.    [provider]  Blood Glucose Monitoring Suppl (ONETOUCH VERIO) w/Device KIT Check blood glucose twice daily before meals 09/21/20   Mack Hook, MD  Calcium Citrate-Vitamin D 500-12.5 MG-MCG CHEW 1 chew by mouth twice daily 08/03/22   Mack Hook, MD  diclofenac (VOLTAREN) 75 MG EC tablet Take 1 tablet (75 mg total) by mouth 2 (two) times daily. 08/17/22   Wallene Huh, DPM  FLUoxetine (PROZAC) 40 MG capsule TAKE ONE CAPSULE BY MOUTH EVERY DAY 09/18/22   Mack Hook, MD  gabapentin (NEURONTIN) 600 MG tablet TAKE ONE TABLET BY MOUTH EVERY MORNING, ONE AT midday, AND TAKE TWO TABLETS BY MOUTH AT BEDTIME 08/22/22   Mack Hook, MD  glipiZIDE (GLUCOTROL XL) 5 MG 24 hr tablet Take 1 tablet (5 mg total) by mouth daily with breakfast. 12/16/20   Mack Hook, MD  ibuprofen (ADVIL) 800 MG tablet 1 tab by mouth daily with meal for severe pain 05/02/22   Mack Hook, MD  Lancets (ONETOUCH DELICA PLUS [id number]) [place]  MEAL(S) 09/21/20   Mack Hook, MD  lisinopril (ZESTRIL) 10 MG tablet 1 tab by mouth daily with Lisinopril/HCTZ combination 02/24/22   Mack Hook, MD  lisinopril-hydrochlorothiazide (ZESTORETIC) 20-12.5 MG tablet TAKE ONE TABLET BY MOUTH EVERY DAY 06/02/22   Mack Hook, MD  Melatonin 3 MG SUBL Place 6 mg under the tongue at bedtime.    [provider]  methocarbamol (ROBAXIN) 500 MG tablet Take 500 mg by mouth every 8 (eight) hours as needed for muscle spasms.  01/02/18   [provider]  Multiple Vitamin (MULTIVITAMIN ADULT PO) Take by mouth.    [provider]  Omega-3 Fatty Acids (FISH OIL) 1000 MG CAPS Take 1,000 mg by mouth 2 (two) times daily.     [provider]  omeprazole (PRILOSEC) 20 MG capsule TAKE ONE CAPSULE BY MOUTH EVERY DAY ON EMPTY stomach 08/22/22   Mack Hook, MD  ondansetron (ZOFRAN) 4 MG tablet Take 1 tablet (4 mg total) by mouth every 8 (eight) hours as needed for nausea or vomiting. 01/24/22   Regal, Tamala Fothergill, DPM  ONETOUCH VERIO test strip USE  STRIP TO CHECK GLUCOSE TWICE DAILY BEFORE MEAL(S) 09/21/20   Mack Hook, MD  potassium chloride (KLOR-CON M) 10 MEQ tablet TAKE ONE TABLET BY MOUTH EVERY DAY 09/18/22   Mack Hook, MD  psyllium (METAMUCIL SMOOTH TEXTURE) 58.6 % powder 1 packet in 8  oz water once daily 08/03/22   Mack Hook, MD  rosuvastatin (CRESTOR) 20 MG tablet TAKE ONE TABLET BY MOUTH EVERY evening 10/20/22   Mack Hook, MD      Allergies    Atorvastatin and Metformin and related    Review of Systems   Review of Systems  Physical Exam Updated Vital Signs BP (!) 154/92 (BP Location: Right Arm)   Pulse 89   Temp (!) 97.5 F (36.4 C)   Resp 17   SpO2 99%  Physical Exam Vitals and nursing note reviewed.  Constitutional:      Appearance: Normal appearance.  HENT:     Head: Normocephalic and atraumatic.  Eyes:     General: No scleral icterus.     Conjunctiva/sclera: Conjunctivae normal.  Neck:     Comments: Full range of motion with flexion and extension of the cervical spine.  Able to laterally flex the cervical spine however pain with lateral rotation.  Reproducible tenderness along the SCM's bilaterally.  Normal ear exam Pulmonary:     Effort: Pulmonary effort is normal. No respiratory distress.  Skin:    Findings: No rash.  Neurological:     Mental Status: She is alert.  Psychiatric:        Mood and Affect: Mood normal.     ED Results / Procedures / Treatments   Labs (all labs ordered are listed, but only abnormal results are displayed) Labs Reviewed - No data to display  EKG None  Radiology CT Cervical Spine Wo Contrast  Result Date: 11/04/2022 CLINICAL DATA:  Neck pain, acute.  Left-sided neck pain. EXAM: CT CERVICAL SPINE WITHOUT CONTRAST TECHNIQUE: Multidetector CT imaging of the cervical spine was performed without intravenous contrast. Multiplanar CT image reconstructions were also generated. RADIATION DOSE REDUCTION: This exam was performed according to the departmental dose-optimization program which includes automated exposure control, adjustment of the mA and/or kV according to patient size and/or use of iterative reconstruction technique. COMPARISON:  None Available. FINDINGS: Alignment: Normal. Skull base and vertebrae: No acute fracture. Normal craniocervical junction. No suspicious bone lesions. Soft tissues and spinal canal: No prevertebral fluid or swelling. No visible canal hematoma. Disc levels: C2-C3:  Normal. C3-C4: Left-sided facet arthropathy and uncovertebral joint spurring results moderate left neural foraminal narrowing. C4-C5: Small central disc protrusion without spinal canal stenosis. Left-greater-than-right facet arthropathy without significant neural foraminal narrowing. C5-C6: Disc bulge results in mild spinal canal stenosis. Right-greater-than-left facet arthropathy and uncovertebral joint spurring  results in moderate right neural foraminal narrowing. C6-C7: Small disc bulge without spinal canal stenosis. Left-sided facet arthropathy and uncovertebral joint spurring results in moderate left neural foraminal narrowing. C7-T1: No disc herniation, spinal canal stenosis or neural foraminal narrowing. Mild bilateral facet arthropathy. Upper chest: Emphysema in the lung apices. Other: Atherosclerotic calcifications of the carotid bulbs. IMPRESSION: 1. No acute fracture or traumatic malalignment of the cervical spine. 2. Multilevel cervical spondylosis and facet arthropathy, most pronounced at C5-C6 where there is mild spinal canal stenosis and moderate right neural foraminal narrowing. 3. Moderate left neural foraminal narrowing at C3-C4 and C6-C7. Electronically Signed   By: Emmit Alexanders M.D.   On: 11/04/2022 15:41    Procedures Procedures   Medications Ordered in ED Medications - No data to display  ED Course/ Medical Decision Making/ A&P                             Medical Decision Making Amount and/or Complexity  of Data Reviewed Radiology: ordered.  Risk Prescription drug management.   62 year old female presenting from urgent care due to neck pain.  Differential includes but is not limited to torticollis, meningitis, stenosis, bone spurring.  This is not exhaustive.  Physical exam: Reproducible tenderness along the SCM's.  Normal upper extremity exam.  Normal ears  Imaging: Originally was not planning to image the patient however with her history of thoracic and lumbar spine abnormalities CT cervical spine was ordered.  I viewed and interpreted this and agree with radiology that there are no acute findings.  Radiologist does note some foraminal narrowing and spinal stenosis.  Treatment: Muscle relaxant and naproxen  MDM/disposition: 63 year old female presenting today with neck pain.  Started this morning.  Went to urgent care who sent her here due to the inability to rotate her  head.  She has reproducible tenderness and muscle spasms along the SCM bilaterally.  Suspect that this is the pain that she is feeling on the left side of her neck.  Did consider dissection however patient has no neurologic symptoms of this.  Pain is reproducible worse with any movement of her neck.  Neurovascularly intact in the bilateral upper extremities.  Doubt emergent concern that needs to be intervened on today.  Additionally, CT scan is nonacute.  She is already established with neurosurgery and has an appointment with them next month.  It is reasonable for her to follow-up however I suspect this torticollis will likely resolve with NSAIDs, muscle relaxants, heat pads and time.  She is stable for discharge at this time.  She is agreeable to this as well.  Ambulatory to and from the bathroom and will be discharged.   Final Clinical Impression(s) / ED Diagnoses Final diagnoses:  Torticollis  Cervical stenosis of spine    Rx / DC Orders ED Discharge Orders          Ordered    methocarbamol (ROBAXIN) 500 MG tablet  Every 8 hours PRN        11/04/22 1617    lidocaine (LIDODERM) 5 %  Every 24 hours        11/04/22 1617    diclofenac (VOLTAREN) 75 MG EC tablet  2 times daily       Note to Pharmacy: This prescription was filled on 08/16/2022. Any refills authorized will be placed on file.   11/04/22 1617           Results and diagnoses were explained to the patient. Return precautions discussed in full. Patient had no additional questions and expressed complete understanding.   This chart was dictated using voice recognition software.  Despite best efforts to proofread,  errors can occur which can change the documentation meaning.    Rhae Hammock, PA-C 11/04/22 [place], Adam, DO 11/06/22 0022

## 2022-11-04 NOTE — ED Provider Notes (Signed)
[facility]    CSN: [id number] Arrival date & time: 11/04/22  1148      History   Chief Complaint Chief Complaint  Patient presents with   Otalgia    HPI Roberta Clark is a 62 y.o. female.  Yesterday developed left-sided neck pain Feels the pain comes from her ear.  Pain with looking right and left Denies hearing changes or ear drainage.  No fevers.  No sore throat or pain with swallowing. Has not taken any medicines  Past Medical History:  Diagnosis Date   Adenomatous colon polyp 05/13/2013   Anemia    Arthritis    Chronic pain    Diabetes mellitus without complication ([place]) [id number]   Type II - diet controlled   Family history of adverse reaction to anesthesia    mother - nausea   Hyperlipidemia 2011   Rash with statin:  not well controlled with Gemfibrozil   Hypertension 2011   Insomnia secondary to chronic pain    Low back pain with left-sided sciatica    Reportedly with lumbar spinal stenosis    Patient Active Problem List   Diagnosis Date Noted   Carpal tunnel syndrome 10/11/2021   Paresthesia of hand 10/11/2021   Osteoporosis without current pathological fracture 06/15/2021   Pain of left hip joint 03/22/2021   S/P insertion of spinal cord stimulator 07/01/2019   Low back pain with left-sided sciatica    Long term use of drug 01/24/2019   Foot pain, bilateral 12/04/2018   Trochanteric bursitis of left hip 08/29/2018   Numbness of lower limb 12/11/2017   Inflammation of sacroiliac joint ([place]) 11/30/2017   Lumbosacral spondylosis without myelopathy 11/30/2017   Avascular necrosis of bone of hip ([place]) 08/24/2017   Avascular necrosis of femoral head, right ([place]) 02/22/2017   Avascular necrosis of femoral head, left ([place]) 10/24/2016   OA (osteoarthritis) of hip 10/24/2016   S/P lumbar laminectomy 05/27/2016   Type 2 diabetes mellitus without complication, without long-term current use of insulin ([place]) 11/03/2015   Multilevel degenerative disc  disease 10/22/2014   Chronic back pain 05/27/2014   Lumbar radiculopathy, chronic 06/14/2013   Adenomatous colon polyp 05/13/2013   Vaginal discharge 08/22/2012   Primary hypertension 07/25/2012   Preventative health care 07/25/2012   Hot flash, menopausal 07/25/2012   Chronic low back pain 07/25/2012   Fibroids 09/02/2011   Tobacco dependence 09/02/2011   Mixed hyperlipidemia 08/15/2009    Past Surgical History:  Procedure Laterality Date   BACK SURGERY     COLONOSCOPY     LUMBAR LAMINECTOMY/DECOMPRESSION MICRODISCECTOMY Bilateral 05/27/2016   Procedure: Left Lumbar Four-Five Laminectomy and Foraminotomy;  Surgeon: Eustace Moore, MD;  Location: [facility];  Service: Neurosurgery;  Laterality: Bilateral;   SPINAL CORD STIMULATOR INSERTION N/A 07/01/2019   Procedure: Spinal cord stimulator via Thoracic Nine laminectomy;  Surgeon: Eustace Moore, MD;  Location: [facility];  Service: Neurosurgery;  Laterality: N/A;  Spinal cord stimulator via Thoracic Nine laminectomy   TOTAL HIP ARTHROPLASTY Left 10/24/2016   Procedure: LEFT TOTAL HIP ARTHROPLASTY ANTERIOR APPROACH;  Surgeon: Gaynelle Arabian, MD;  Location: [facility];  Service: Orthopedics;  Laterality: Left;   TOTAL HIP ARTHROPLASTY Right 02/22/2017   Procedure: RIGHT TOTAL HIP ARTHROPLASTY ANTERIOR APPROACH;  Surgeon: Gaynelle Arabian, MD;  Location: [facility];  Service: Orthopedics;  Laterality: Right;   TUBAL LIGATION  1985    OB History     Gravida  2   Para  2   Term  2   Preterm  0   AB  0   Living  2      SAB  0   IAB  0   Ectopic  0   Multiple  0   Live Births               Home Medications    Prior to Admission medications   Medication Sig Start Date End Date Taking? Authorizing Provider  amitriptyline (ELAVIL) 25 MG tablet TAKE ONE TABLET BY MOUTH AT BEDTIME AS NEEDED slep 08/22/22   Mack Hook, MD  BIOTIN PO Take by mouth.    [provider]  Blood Glucose Monitoring Suppl (ONETOUCH VERIO)  w/Device KIT Check blood glucose twice daily before meals 09/21/20   Mack Hook, MD  Calcium Citrate-Vitamin D 500-12.5 MG-MCG CHEW 1 chew by mouth twice daily 08/03/22   Mack Hook, MD  diclofenac (VOLTAREN) 75 MG EC tablet Take 1 tablet (75 mg total) by mouth 2 (two) times daily. 08/17/22   Wallene Huh, DPM  FLUoxetine (PROZAC) 40 MG capsule TAKE ONE CAPSULE BY MOUTH EVERY DAY 09/18/22   Mack Hook, MD  gabapentin (NEURONTIN) 600 MG tablet TAKE ONE TABLET BY MOUTH EVERY MORNING, ONE AT midday, AND TAKE TWO TABLETS BY MOUTH AT BEDTIME 08/22/22   Mack Hook, MD  glipiZIDE (GLUCOTROL XL) 5 MG 24 hr tablet Take 1 tablet (5 mg total) by mouth daily with breakfast. 12/16/20   Mack Hook, MD  ibuprofen (ADVIL) 800 MG tablet 1 tab by mouth daily with meal for severe pain 05/02/22   Mack Hook, MD  Lancets (ONETOUCH DELICA PLUS [id number]) [place] MEAL(S) 09/21/20   Mack Hook, MD  lisinopril (ZESTRIL) 10 MG tablet 1 tab by mouth daily with Lisinopril/HCTZ combination 02/24/22   Mack Hook, MD  lisinopril-hydrochlorothiazide (ZESTORETIC) 20-12.5 MG tablet TAKE ONE TABLET BY MOUTH EVERY DAY 06/02/22   Mack Hook, MD  Melatonin 3 MG SUBL Place 6 mg under the tongue at bedtime.    [provider]  methocarbamol (ROBAXIN) 500 MG tablet Take 500 mg by mouth every 8 (eight) hours as needed for muscle spasms.  01/02/18   [provider]  Multiple Vitamin (MULTIVITAMIN ADULT PO) Take by mouth.    [provider]  Omega-3 Fatty Acids (FISH OIL) 1000 MG CAPS Take 1,000 mg by mouth 2 (two) times daily.     [provider]  omeprazole (PRILOSEC) 20 MG capsule TAKE ONE CAPSULE BY MOUTH EVERY DAY ON EMPTY stomach 08/22/22   Mack Hook, MD  ondansetron (ZOFRAN) 4 MG tablet Take 1 tablet (4 mg total) by mouth every 8 (eight) hours as needed for nausea or vomiting. 01/24/22    Regal, Tamala Fothergill, DPM  ONETOUCH VERIO test strip USE  STRIP TO CHECK GLUCOSE TWICE DAILY BEFORE MEAL(S) 09/21/20   Mack Hook, MD  potassium chloride (KLOR-CON M) 10 MEQ tablet TAKE ONE TABLET BY MOUTH EVERY DAY 09/18/22   Mack Hook, MD  psyllium (METAMUCIL SMOOTH TEXTURE) 58.6 % powder 1 packet in 8 oz water once daily 08/03/22   Mack Hook, MD  rosuvastatin (CRESTOR) 20 MG tablet TAKE ONE TABLET BY MOUTH EVERY evening 10/20/22   Mack Hook, MD    Family History Family History  Problem Relation Age of Onset   Heart disease Mother 56       Hx of MI    Hypertension Mother    Diabetes Mother  Diabetes Sister    [name]' disease Sister    Lupus Father    Graves' disease Brother    Diabetes Brother    Other Brother        COVID   Arthritis Daughter        Not clear if RA   Hypertension Daughter    Migraines Daughter    Epilepsy Son        Started 51 months of age after bacterial meningitis   GER disease Sister     Social History Social History   Tobacco Use   Smoking status: Some Days    Packs/day: 0.25    Years: 6.00    Additional pack years: 0.00    Total pack years: 1.50    Types: Cigarettes    Start date: 12/17/1976   Smokeless tobacco: Never   Tobacco comments:    3 daily  Vaping Use   Vaping Use: Never used  Substance Use Topics   Alcohol use: No    Alcohol/week: 0.0 standard drinks of alcohol   Drug use: No     Allergies   Atorvastatin and Metformin and related   Review of Systems Review of Systems As per HPI  Physical Exam Triage Vital Signs ED Triage Vitals  Enc Vitals Group     BP 11/04/22 1211 (!) 171/93     Pulse Rate 11/04/22 1211 96     Resp 11/04/22 1211 16     Temp 11/04/22 1211 99.4 F (37.4 C)     Temp Source 11/04/22 1211 Oral     SpO2 11/04/22 1211 96 %     Weight --      Height --      Head Circumference --      Peak Flow --      Pain Score 11/04/22 1214 10     Pain Loc --      Pain Edu? --       Excl. in [place]? --    No data found.  Updated Vital Signs BP (!) 143/82 (BP Location: Right Arm)   Pulse 86   Temp 98.9 F (37.2 C) (Oral)   Resp 16   SpO2 96%    Physical Exam Vitals and nursing note reviewed.  Constitutional:      General: She is not in acute distress.    Comments: Minimal neck movement  HENT:     Right Ear: Tympanic membrane, ear canal and external ear normal. No mastoid tenderness.     Left Ear: Tympanic membrane, ear canal and external ear normal. No mastoid tenderness.     Ears:     Comments: No protrusion of ear. No mastoid tenderness or swelling    Mouth/Throat:     Mouth: Mucous membranes are moist.     Pharynx: Oropharynx is clear. No posterior oropharyngeal erythema.  Eyes:     Conjunctiva/sclera: Conjunctivae normal.  Neck:     Trachea: Trachea normal.     Comments: Decreased range of motion R and L.  Normal chin to chest without meningeal signs.  There is a small, tender, mobile, rubbery lymph node in the L anterior cervical chain.  Tender along L SCM.  No obvious swelling Cardiovascular:     Rate and Rhythm: Normal rate and regular rhythm.     Heart sounds: Normal heart sounds.  Pulmonary:     Effort: Pulmonary effort is normal.     Breath sounds: Normal breath sounds.  Musculoskeletal:     Cervical  back: Pain with movement and muscular tenderness present. Decreased range of motion.  Lymphadenopathy:     Cervical: Cervical adenopathy present.  Skin:    General: Skin is warm and dry.  Neurological:     Mental Status: She is alert and oriented to person, place, and time.     UC Treatments / Results  Labs (all labs ordered are listed, but only abnormal results are displayed) Labs Reviewed - No data to display  EKG   Radiology No results found.  Procedures Procedures (including critical care time)  Medications Ordered in UC Medications  acetaminophen (TYLENOL) tablet 650 mg (650 mg Oral Given 11/04/22 1251)    Initial  Impression / Assessment and Plan / UC Course  I have reviewed the triage vital signs and the nursing notes.  Pertinent labs & imaging results that were available during my care of the patient were reviewed by me and considered in my medical decision making (see chart for details).  Tylenol dose given. No improvement in pain or stiffness. Still limited ROM and tender to touch. Suspect less likely muscular etiology given persistent pain. Advised to be seen in the ED for higher level of care. Sent via POV  Final Clinical Impressions(s) / UC Diagnoses   Final diagnoses:  Neck stiffness  Neck pain on left side     Discharge Instructions      Sent to ED via POV     ED Prescriptions   None    PDMP not reviewed this encounter.   Les [place]ou, [place] 11/04/22 1343

## 2022-11-04 NOTE — Discharge Instructions (Addendum)
Sent to ED via POV ?

## 2022-11-04 NOTE — Discharge Instructions (Addendum)
You came to the emergency department today with pain in your neck.  You were having difficulty rotating her neck as well.  Your CT scan shows some cervical spinal stenosis and narrowing.  This is like what happened in your lower spine.  From the emergency department standpoint we will try and treat your symptoms but ultimately you will need to follow-up with neurosurgery.  I have sent the following medications to your pharmacy:  Robaxin. This is a muscle relaxant.  Do not drive, drink alcohol or operate machinery on this Diclofenac.  I have refilled this medication. Lidoderm patches: You may use these for 12 hours in a row.  Be sure to have 12 hours patch free.

## 2022-11-07 MED ORDER — HYDROCODONE-ACETAMINOPHEN 10-325 MG PO TABS: 1.0000 | ORAL_TABLET | Freq: Three times a day (TID) | ORAL | 0 refills | Status: AC | PRN

## 2022-11-07 NOTE — Telephone Encounter (Signed)
Patient reported that she is currently taking diclofenac twice daily. She says that she only takes the ibuprofen when she runs low on diclofenac.

## 2022-11-07 NOTE — Addendum Note (Signed)
Addended by: Wallene Huh on: 11/07/2022 04:20 PM   Modules accepted: Orders

## 2022-11-08 ENCOUNTER — Encounter: Payer: Self-pay | Admitting: Podiatry

## 2022-11-08 ENCOUNTER — Other Ambulatory Visit: Payer: Self-pay | Admitting: Internal Medicine

## 2022-11-08 DIAGNOSIS — M25775 Osteophyte, left foot: Secondary | ICD-10-CM | POA: Diagnosis not present

## 2022-11-08 DIAGNOSIS — Z4889 Encounter for other specified surgical aftercare: Secondary | ICD-10-CM | POA: Diagnosis not present

## 2022-11-08 NOTE — Telephone Encounter (Signed)
I would like for her to just stay on the Diclofenac then.   She should ask for refills well before she runs out so she doesn't need the ibuprofen.

## 2022-11-08 NOTE — Telephone Encounter (Signed)
Patient notified of update 

## 2022-11-14 ENCOUNTER — Ambulatory Visit (INDEPENDENT_AMBULATORY_CARE_PROVIDER_SITE_OTHER): Payer: Medicare HMO

## 2022-11-14 ENCOUNTER — Ambulatory Visit (INDEPENDENT_AMBULATORY_CARE_PROVIDER_SITE_OTHER): Payer: Medicare HMO | Admitting: Podiatry

## 2022-11-14 VITALS — BP 140/76 | HR 77

## 2022-11-14 DIAGNOSIS — Z472 Encounter for removal of internal fixation device: Secondary | ICD-10-CM

## 2022-11-14 DIAGNOSIS — D169 Benign neoplasm of bone and articular cartilage, unspecified: Secondary | ICD-10-CM

## 2022-11-14 DIAGNOSIS — Z9889 Other specified postprocedural states: Secondary | ICD-10-CM

## 2022-11-14 NOTE — Progress Notes (Signed)
Patient presents today for post op visit # 1 , patient of Dr. Paulla Dolly   POV # 1 DOS 11/08/22 --- REMOVAL PIN LEFT 1ST METATARSAL, REMOVAL BONE SPUR TOP LEFT FOOT   Patient presents in her surgical shoe. Denies any falls or injury to the foot. Foot is slightly swollen. No signs of infection. No calf pain or shortness of breath. Bandages dry and intact. Incision is intact.  Taking pain medication regularly.  She states she has had quite a bit of pain and is almost out of her pain medication.   BP: 140/76 P: 77     Xrays taken today and reviewed by Dr. Paulla Dolly. He did take a look at the foot today as well.   Foot redressed today and placed back in the boot. Reviewed icing and elevation. Patient will follow up with Dr. Paulla Dolly for POV# 2 for suture removal.

## 2022-11-24 ENCOUNTER — Telehealth: Payer: Self-pay | Admitting: Podiatry

## 2022-11-24 NOTE — Telephone Encounter (Signed)
Patient called and stated that a stitch might have came out and it had some blood on the gauze. Patient would like to know what to do. She did go ahead and re-wrapped it. But she stated it may also pus coming out  and surgical site is tender on top of foot .  Offered to get patient in tomorrow. She stated she will call back in the morning

## 2022-11-28 ENCOUNTER — Encounter: Payer: Self-pay | Admitting: Podiatry

## 2022-11-28 ENCOUNTER — Ambulatory Visit (INDEPENDENT_AMBULATORY_CARE_PROVIDER_SITE_OTHER): Payer: Medicare HMO | Admitting: Podiatry

## 2022-11-28 ENCOUNTER — Ambulatory Visit (INDEPENDENT_AMBULATORY_CARE_PROVIDER_SITE_OTHER): Payer: Medicare HMO

## 2022-11-28 DIAGNOSIS — D169 Benign neoplasm of bone and articular cartilage, unspecified: Secondary | ICD-10-CM | POA: Diagnosis not present

## 2022-11-28 NOTE — Progress Notes (Signed)
Subjective:   Patient ID: Roberta Clark, female   DOB: 62 y.o.   MRN: [id number]   HPI Patient states still having some soreness but overall is doing well   ROS      Objective:  Physical Exam  Neuro vascular status intact negative Denna Haggard' sign noted wound edges are coapted well there is no active drainage today there is some irritation around the cuneiform site but it is very localized with crusted tissue but no drainage erythema or edema noted     Assessment:  Overall doing well but did put Vaseline on this area which may have irritated the incision site     Plan:  Stitches removed I discussed not using Vaseline on any of this and I advised on soaks and padding to keep pressure off it and reappoint 4 weeks to reevaluate or earlier if any issues were to occur swelling redness drainage or other pathology  X-rays indicate satisfactory resection of bone removal of pin no other pathology noted

## 2022-11-30 ENCOUNTER — Other Ambulatory Visit: Payer: Self-pay | Admitting: Podiatry

## 2022-11-30 ENCOUNTER — Telehealth: Payer: Self-pay | Admitting: Podiatry

## 2022-11-30 DIAGNOSIS — D169 Benign neoplasm of bone and articular cartilage, unspecified: Secondary | ICD-10-CM

## 2022-11-30 NOTE — Telephone Encounter (Signed)
Pt called checking on next appt and also asked to leave a message for Dr Charlsie Merles. She stated last night when she was in the shower she noticed that there were still a few stitches in her foot. Should she wait for her next appt on 5.13 or does she need a sooner appt?

## 2022-12-02 ENCOUNTER — Other Ambulatory Visit: Payer: Self-pay | Admitting: Neurological Surgery

## 2022-12-02 DIAGNOSIS — M542 Cervicalgia: Secondary | ICD-10-CM

## 2022-12-02 NOTE — Telephone Encounter (Signed)
Ok to wait

## 2022-12-04 DIAGNOSIS — Z5948 Other specified lack of adequate food: Secondary | ICD-10-CM | POA: Insufficient documentation

## 2022-12-04 DIAGNOSIS — R55 Syncope and collapse: Secondary | ICD-10-CM | POA: Insufficient documentation

## 2022-12-05 NOTE — Telephone Encounter (Signed)
Left message for pt that Dr Charlsie Merles said it was ok to wait until next appt on 5.13 with the stiches still in and to call if needed.

## 2022-12-06 ENCOUNTER — Ambulatory Visit (INDEPENDENT_AMBULATORY_CARE_PROVIDER_SITE_OTHER): Payer: Medicare HMO | Admitting: Internal Medicine

## 2022-12-06 VITALS — BP 130/72 | HR 84 | Resp 16 | Ht 63.0 in | Wt 109.0 lb

## 2022-12-06 DIAGNOSIS — E119 Type 2 diabetes mellitus without complications: Secondary | ICD-10-CM | POA: Diagnosis not present

## 2022-12-06 DIAGNOSIS — E782 Mixed hyperlipidemia: Secondary | ICD-10-CM

## 2022-12-06 DIAGNOSIS — R159 Full incontinence of feces: Secondary | ICD-10-CM

## 2022-12-06 DIAGNOSIS — G8929 Other chronic pain: Secondary | ICD-10-CM

## 2022-12-06 DIAGNOSIS — M533 Sacrococcygeal disorders, not elsewhere classified: Secondary | ICD-10-CM | POA: Diagnosis not present

## 2022-12-06 MED ORDER — GLIPIZIDE ER 5 MG PO TB24: 5.0000 mg | ORAL_TABLET | Freq: Every day | ORAL | 11 refills | Status: DC

## 2022-12-06 NOTE — Progress Notes (Signed)
Subjective:    Patient ID: Roberta Clark, female   DOB: 07/31/1961, 62 y.o.   MRN: [id number]   HPI   Stool incontinence:  Started Metamucil and her stool incontinence worsened in the sense that the pieces coming out were larger and more frequent, but then continued and the incontinence resolved for about 1 month.  Stopped the Metamucil and recurred again a week later.  She has normal sensation of needing to pass a bowel movement at other times.  She is willing to restart and take long term.  Also, complains of bilateral lower abdominal pain starting 5 days ago.  Felt like a continuous cramp, but awakened this morning and the pain was gone.  Has not had a BM today.  Last was yesterday and soft.  No melena or hematochezia. This felt more pelvic in nature.  No period for more than 10 years   2.  Osteoporosis:  Has appt with Dr, Sharl Ma next week.    Current Meds  Medication Sig   amitriptyline (ELAVIL) 25 MG tablet TAKE ONE TABLET BY MOUTH AT BEDTIME AS NEEDED slep   BIOTIN PO Take by mouth.   Blood Glucose Monitoring Suppl (ONETOUCH VERIO) w/Device KIT Check blood glucose twice daily before meals   Calcium Citrate-Vitamin D 500-12.5 MG-MCG CHEW 1 chew by mouth twice daily   diclofenac (VOLTAREN) 75 MG EC tablet Take 1 tablet (75 mg total) by mouth 2 (two) times daily.   FLUoxetine (PROZAC) 40 MG capsule TAKE ONE CAPSULE BY MOUTH EVERY DAY   gabapentin (NEURONTIN) 600 MG tablet TAKE ONE TABLET BY MOUTH EVERY MORNING, ONE AT midday, AND TAKE TWO TABLETS BY MOUTH AT BEDTIME   glipiZIDE (GLUCOTROL XL) 5 MG 24 hr tablet Take 1 tablet (5 mg total) by mouth daily with breakfast. (Patient taking differently: Take 5 mg by mouth daily with breakfast. Needs refill)   Lancets (ONETOUCH DELICA PLUS LANCET33G) MISC USE   TO CHECK GLUCOSE TWICE DAILY BEFORE MEAL(S)   lisinopril (ZESTRIL) 10 MG tablet 1 tab by mouth daily with Lisinopril/HCTZ combination   lisinopril-hydrochlorothiazide (ZESTORETIC)  20-12.5 MG tablet TAKE ONE TABLET BY MOUTH EVERY DAY   Melatonin 3 MG SUBL Place 6 mg under the tongue at bedtime.   Multiple Vitamin (MULTIVITAMIN ADULT PO) Take by mouth.   Omega-3 Fatty Acids (FISH OIL) 1000 MG CAPS Take 1,000 mg by mouth 2 (two) times daily.    omeprazole (PRILOSEC) 20 MG capsule TAKE ONE CAPSULE BY MOUTH EVERY DAY ON EMPTY stomach   ondansetron (ZOFRAN) 4 MG tablet Take 1 tablet (4 mg total) by mouth every 8 (eight) hours as needed for nausea or vomiting.   ONETOUCH VERIO test strip USE  STRIP TO CHECK GLUCOSE TWICE DAILY BEFORE MEAL(S)   potassium chloride (KLOR-CON M) 10 MEQ tablet TAKE ONE TABLET BY MOUTH EVERY DAY   rosuvastatin (CRESTOR) 20 MG tablet TAKE ONE TABLET BY MOUTH EVERY evening   Allergies  Allergen Reactions   Atorvastatin Rash   Metformin And Related Nausea And Vomiting     Review of Systems    Objective:   BP 130/72 (BP Location: Left Arm, Patient Position: Sitting, Cuff Size: Normal)   Pulse 84   Resp 16   Ht 5\' 3"  (1.6 m)   Wt 109 lb (49.4 kg)   BMI 19.31 kg/m   Physical Exam NAD Lungs:  CTA CV:  RRR without murmur or rub.  Radial pulses normal and equal Abd:  S, NT, No  HSM or mass, + BS MS:  ?tenderness along left SI joint and feels stretch in area of discomfort with abduction and external rotation of hip with traction on femur in this position.     Assessment & Plan   Fecal incontinence:  have not found anything neurologic on exam and not consistent.  Seemed to resolve when taking fiber laxative regularly, so would like for her to restart and stay on it.  Again, may be incontinent around harder stool with constipation.  2.  Left ?SI joint pain or muscle of pelvic girdle, such as the piriformis.  Referral to PT for evaluation and treatment.  3.  Lost disability:  referral to [employer] to see if can get this back.    4.  Cervical spine stenosis:  Plans  for MR with Dr. Yetta Barre  5.  DM:  refill Glipizide.

## 2022-12-07 ENCOUNTER — Ambulatory Visit (INDEPENDENT_AMBULATORY_CARE_PROVIDER_SITE_OTHER): Payer: Medicare HMO | Admitting: Podiatry

## 2022-12-07 ENCOUNTER — Telehealth: Payer: Self-pay | Admitting: *Deleted

## 2022-12-07 ENCOUNTER — Ambulatory Visit (INDEPENDENT_AMBULATORY_CARE_PROVIDER_SITE_OTHER): Payer: Medicare HMO

## 2022-12-07 DIAGNOSIS — D169 Benign neoplasm of bone and articular cartilage, unspecified: Secondary | ICD-10-CM | POA: Diagnosis not present

## 2022-12-07 MED ORDER — DOXYCYCLINE HYCLATE 100 MG PO TABS: 100.0000 mg | ORAL_TABLET | Freq: Two times a day (BID) | ORAL | 1 refills | Status: DC

## 2022-12-07 NOTE — Telephone Encounter (Signed)
Patient is calling because her foot wound is not healing, looks like it has opened up, very painful, please advise. Please schedule for sooner appointment.

## 2022-12-08 ENCOUNTER — Encounter: Payer: Medicare HMO | Admitting: Podiatry

## 2022-12-08 NOTE — Progress Notes (Signed)
Subjective:   Patient ID: Roberta Clark, female   DOB: 62 y.o.   MRN: [id number]   HPI Patient states she was doing better but in the last few days its become irritated again   ROS      Objective:  Physical Exam  Neurovascular status intact with the dorsal incisions showing quite a bit of dryness and crusty like tissue formation.  It is localized there is no proximal edema erythema noted with incision on the first metatarsal healing normally     Assessment:  Possibility for suture reaction creating this crusted tissue or natural skin structure cannot rule out any kind of subtle infection even though nothing is showing obviously     Plan:  Reviewed x-ray indicating no other changes or lysis and I went ahead today did a proximal nerve block I cleaned out the tissue flushed it did not note any drainage and applied sterile dressing and begin home wet-to-dry dressings.  There was several small pieces of suture which I removed and appeared to be injected and hopefully this will solve the problem.  As precautionary measure placed on doxycycline twice daily gave strict instructions to let us know if any issues were to occur and see back 2 weeks

## 2022-12-13 NOTE — Discharge Instructions (Signed)

## 2022-12-14 ENCOUNTER — Ambulatory Visit
Admission: RE | Admit: 2022-12-14 | Discharge: 2022-12-14 | Disposition: A | Discharge status: Home / Self Care | Payer: Medicare HMO | Source: Ambulatory Visit | Attending: Neurological Surgery | Admitting: Neurological Surgery

## 2022-12-14 DIAGNOSIS — M542 Cervicalgia: Secondary | ICD-10-CM

## 2022-12-14 MED ORDER — DIAZEPAM 5 MG PO TABS
5.0000 mg | ORAL_TABLET | Freq: Once | ORAL | Status: AC
  Administered 2022-12-14: 5 mg via ORAL

## 2022-12-14 MED ORDER — MEPERIDINE HCL 50 MG/ML IJ SOLN: 50.0000 mg | Freq: Once | INTRAMUSCULAR | Status: DC | PRN

## 2022-12-14 MED ORDER — IOPAMIDOL (ISOVUE-M 300) INJECTION 61%
10.0000 mL | Freq: Once | INTRAMUSCULAR | Status: AC
  Administered 2022-12-14: 10 mL via INTRATHECAL

## 2022-12-14 MED ORDER — ONDANSETRON HCL 4 MG/2ML IJ SOLN: 4.0000 mg | Freq: Once | INTRAMUSCULAR | Status: DC | PRN

## 2022-12-16 ENCOUNTER — Other Ambulatory Visit: Payer: Self-pay | Admitting: Podiatry

## 2022-12-16 ENCOUNTER — Other Ambulatory Visit: Payer: Self-pay | Admitting: Internal Medicine

## 2022-12-16 DIAGNOSIS — Z1231 Encounter for screening mammogram for malignant neoplasm of breast: Secondary | ICD-10-CM

## 2022-12-20 ENCOUNTER — Other Ambulatory Visit (INDEPENDENT_AMBULATORY_CARE_PROVIDER_SITE_OTHER): Payer: Medicare HMO

## 2022-12-20 DIAGNOSIS — E119 Type 2 diabetes mellitus without complications: Secondary | ICD-10-CM | POA: Diagnosis not present

## 2022-12-20 DIAGNOSIS — E782 Mixed hyperlipidemia: Secondary | ICD-10-CM

## 2022-12-20 DIAGNOSIS — Z79899 Other long term (current) drug therapy: Secondary | ICD-10-CM | POA: Diagnosis not present

## 2022-12-21 LAB — COMPREHENSIVE METABOLIC PANEL
ALT: 36 IU/L — ABNORMAL HIGH (ref 0–32)
AST: 35 IU/L (ref 0–40)
Albumin/Globulin Ratio: 1.9 (ref 1.2–2.2)
Albumin: 4.9 g/dL (ref 3.9–4.9)
Alkaline Phosphatase: 70 IU/L (ref 44–121)
BUN/Creatinine Ratio: 25 (ref 12–28)
BUN: 20 mg/dL (ref 8–27)
Bilirubin Total: <0.2 mg/dL (ref 0.0–1.2)
CO2: 19 mmol/L — ABNORMAL LOW (ref 20–29)
Calcium: 9.8 mg/dL (ref 8.7–10.3)
Chloride: 105 mmol/L (ref 96–106)
Creatinine, Ser: 0.81 mg/dL (ref 0.57–1.00)
Globulin, Total: 2.6 g/dL (ref 1.5–4.5)
Glucose: 144 mg/dL — ABNORMAL HIGH (ref 70–99)
Potassium: 4.3 mmol/L (ref 3.5–5.2)
Sodium: 142 mmol/L (ref 134–144)
Total Protein: 7.5 g/dL (ref 6.0–8.5)
eGFR: 82 mL/min/{1.73_m2} (ref >59)

## 2022-12-21 LAB — CBC WITH DIFFERENTIAL/PLATELET
Basophils Absolute: 0 10*3/uL (ref 0.0–0.2)
Basos: 1 %
EOS (ABSOLUTE): 0.2 10*3/uL (ref 0.0–0.4)
Eos: 3 %
Hematocrit: 44.1 % (ref 34.0–46.6)
Hemoglobin: 14.1 g/dL (ref 11.1–15.9)
Immature Grans (Abs): 0 10*3/uL (ref 0.0–0.1)
Immature Granulocytes: 0 %
Lymphocytes Absolute: 2.8 10*3/uL (ref 0.7–3.1)
Lymphs: 43 %
MCH: 29.6 pg (ref 26.6–33.0)
MCHC: 32 g/dL (ref 31.5–35.7)
MCV: 93 fL (ref 79–97)
Monocytes Absolute: 0.4 10*3/uL (ref 0.1–0.9)
Monocytes: 6 %
Neutrophils Absolute: 3.1 10*3/uL (ref 1.4–7.0)
Neutrophils: 47 %
Platelets: 344 10*3/uL (ref 150–450)
RBC: 4.77 x10E6/uL (ref 3.77–5.28)
RDW: 12.4 % (ref 11.7–15.4)
WBC: 6.5 10*3/uL (ref 3.4–10.8)

## 2022-12-21 LAB — LIPID PANEL W/O CHOL/HDL RATIO
Cholesterol, Total: 228 mg/dL — ABNORMAL HIGH (ref 100–199)
HDL: 60 mg/dL (ref >39)
LDL Chol Calc (NIH): 153 mg/dL — ABNORMAL HIGH (ref 0–99)
Triglycerides: 83 mg/dL (ref 0–149)
VLDL Cholesterol Cal: 15 mg/dL (ref 5–40)

## 2022-12-21 LAB — HEMOGLOBIN A1C
Est. average glucose Bld gHb Est-mCnc: 140 mg/dL
Hgb A1c MFr Bld: 6.5 % — ABNORMAL HIGH (ref 4.8–5.6)

## 2022-12-26 ENCOUNTER — Ambulatory Visit (INDEPENDENT_AMBULATORY_CARE_PROVIDER_SITE_OTHER): Payer: Medicare HMO

## 2022-12-26 ENCOUNTER — Encounter: Payer: Self-pay | Admitting: Podiatry

## 2022-12-26 ENCOUNTER — Ambulatory Visit (INDEPENDENT_AMBULATORY_CARE_PROVIDER_SITE_OTHER): Payer: Medicare HMO | Admitting: Podiatry

## 2022-12-26 DIAGNOSIS — D169 Benign neoplasm of bone and articular cartilage, unspecified: Secondary | ICD-10-CM

## 2022-12-28 NOTE — Progress Notes (Signed)
Subjective:   Patient ID: Roberta Clark, female   DOB: 62 y.o.   MRN: [id number]   HPI Patient states she is doing much better and feels like the incision is improving   ROS      Objective:  Physical Exam  Neurovascular status intact with pain in the dorsal left foot that is improving there is still some crusted tissue there is no breakdown of tissue or other pathology     Assessment:  Slow healing dorsal incision site from tarsal exostectomy that appears to be resolving now with crusted tissue formation slight elevation but no other pathology     Plan:  H&P reviewed x-ray recommended that she continue with padding around the area and then it should completely resolve but may take another 4 to 6 weeks.  Did discuss I may do a small cortisone injection to reduce inflammation if symptoms persist over the next couple months but no rush to do that  X-rays indicate satisfactory resection is spur no indication osteolysis or other pathology

## 2023-01-03 ENCOUNTER — Other Ambulatory Visit: Payer: Self-pay | Admitting: Podiatry

## 2023-01-05 ENCOUNTER — Ambulatory Visit
Admission: RE | Admit: 2023-01-05 | Discharge: 2023-01-05 | Disposition: A | Discharge status: Home / Self Care | Payer: Medicaid Other | Source: Ambulatory Visit | Attending: Internal Medicine | Admitting: Internal Medicine

## 2023-01-05 DIAGNOSIS — M810 Age-related osteoporosis without current pathological fracture: Secondary | ICD-10-CM

## 2023-01-05 NOTE — Telephone Encounter (Signed)
Spoke with Dr. Charlsie Merles.  Refill on doxycycline is not indicated at this time.  - Dorethy Tomey

## 2023-01-17 ENCOUNTER — Other Ambulatory Visit: Payer: Self-pay | Admitting: Internal Medicine

## 2023-01-19 ENCOUNTER — Ambulatory Visit: Payer: Medicare HMO

## 2023-01-20 ENCOUNTER — Ambulatory Visit: Payer: Medicare HMO

## 2023-01-20 ENCOUNTER — Ambulatory Visit
Admission: RE | Admit: 2023-01-20 | Discharge: 2023-01-20 | Disposition: A | Discharge status: Home / Self Care | Payer: Medicare HMO | Source: Ambulatory Visit | Attending: Internal Medicine | Admitting: Internal Medicine

## 2023-01-20 DIAGNOSIS — Z1231 Encounter for screening mammogram for malignant neoplasm of breast: Secondary | ICD-10-CM

## 2023-01-22 DIAGNOSIS — G8929 Other chronic pain: Secondary | ICD-10-CM | POA: Insufficient documentation

## 2023-01-22 DIAGNOSIS — R159 Full incontinence of feces: Secondary | ICD-10-CM | POA: Insufficient documentation

## 2023-02-01 ENCOUNTER — Other Ambulatory Visit: Payer: Self-pay | Admitting: Internal Medicine

## 2023-02-01 MED ORDER — ZOLEDRONIC ACID 5 MG/100ML IV SOLN: INTRAVENOUS | 0 refills | Status: AC

## 2023-02-03 ENCOUNTER — Other Ambulatory Visit: Payer: Self-pay

## 2023-02-03 DIAGNOSIS — E119 Type 2 diabetes mellitus without complications: Secondary | ICD-10-CM

## 2023-02-03 MED ORDER — IBUPROFEN 800 MG PO TABS: ORAL_TABLET | ORAL | 0 refills | Status: DC

## 2023-02-03 MED ORDER — ONETOUCH DELICA PLUS LANCET33G MISC
11 refills | Status: DC
Start: 2023-02-03 — End: 2023-03-09

## 2023-02-14 ENCOUNTER — Telehealth: Payer: Self-pay | Admitting: *Deleted

## 2023-02-14 NOTE — Telephone Encounter (Signed)
Patient is calling because her post surgery foot is swollen, bruising on it and hurting, should she continue icing, elevating or schedule for a sooner appointment? Scheduled  on the 8th(post op). Please advise.

## 2023-02-15 NOTE — Telephone Encounter (Signed)
Continue to elevate and keep appointment

## 2023-02-20 ENCOUNTER — Ambulatory Visit (INDEPENDENT_AMBULATORY_CARE_PROVIDER_SITE_OTHER): Payer: Medicare HMO

## 2023-02-20 ENCOUNTER — Other Ambulatory Visit: Payer: Self-pay | Admitting: Podiatry

## 2023-02-20 ENCOUNTER — Ambulatory Visit (INDEPENDENT_AMBULATORY_CARE_PROVIDER_SITE_OTHER): Payer: Medicare HMO | Admitting: Podiatry

## 2023-02-20 ENCOUNTER — Ambulatory Visit: Payer: Medicare HMO

## 2023-02-20 DIAGNOSIS — M21611 Bunion of right foot: Secondary | ICD-10-CM

## 2023-02-20 DIAGNOSIS — M7752 Other enthesopathy of left foot: Secondary | ICD-10-CM

## 2023-02-21 ENCOUNTER — Ambulatory Visit: Payer: Medicare HMO | Admitting: Physical Therapy

## 2023-02-21 NOTE — Progress Notes (Signed)
Subjective:   Patient ID: Roberta Clark, female   DOB: 62 y.o.   MRN: [id number]   HPI Patient states doing very well with the left foot and ready to get right foot fixed for painful bunion.  States she has tried wider shoes she has tried cushions for it and other modalities without relief of symptoms.  Patient does smoke very sporadically and is active   ROS      Objective:  Physical Exam  Neuro muscular status intact negative Denna Haggard' sign noted wound edges left healing well and swelling has gone down significant structural bunion deformity right painful when pressed red and irritated with pressure with deviation of the hallux with patient finally having the chance to get it corrected     Assessment:  Doing well post surgery left with incision sites healing well with significant structural bunion deformity right painful     Plan:  H&P x-rays both feet discussed and reviewed and I have recommended correction of right foot.  I allowed her to read consent form for distal osteotomy explaining procedure and risk and she is willing to accept risk and after extensive review signed consent for note all preoperative instructions were given.  Patient understands total correction and recovery can take upwards of 6 months and wants surgery understanding the risk  X-rays indicate significant elevation to 1 2 intermetatarsal angle right of 16 degrees with deviation of the hallux and left foot show satisfactory removal of pin removal of bone spur dorsal left foot

## 2023-02-27 ENCOUNTER — Telehealth: Payer: Self-pay | Admitting: Podiatry

## 2023-02-27 NOTE — Telephone Encounter (Signed)
We cannot find out this afternoon. I'd be happy to do a peer to peer. They will usually approve it

## 2023-02-27 NOTE — Telephone Encounter (Signed)
Patient's authorization for surgery on 02/28/23 is still under consideration by the insurance company. Called patient to advise that the date would need to be moved since the [place] is still under review by insurance. No working number for the patient. Canceling surgery until patient can contact us to reschedule. Notified Dr Charlsie Merles and Aram Beecham at [facility].

## 2023-02-27 NOTE — Telephone Encounter (Signed)
Patient's authorization for surgery on 02/28/23 is still under consideration by the insurance company. Called patient to advise that the date would need to be moved since the [place] is still under review by insurance. No working number for the patient. Canceling surgery until patient can contact us to reschedule. Tried emergency contact, Lucretia Field, left message. Notified Dr Charlsie Merles and Aram Beecham at [facility].

## 2023-03-05 NOTE — Progress Notes (Signed)
COVID Vaccine received:  []  No [x]  Yes Date of any COVID positive Test in last 90 days:  PCP - Julieanne Manson, MD  at [facility]  Cardiologist - none  Chest x-ray - 06-27-2019 2v  Epic EKG -  (06-27-2019)  will repeat at PST Stress Test -  ECHO -  Cardiac Cath -   PCR screen: []  Ordered & Completed           []   No Order but Needs PROFEND           [x]   N/A for this surgery  Surgery Plan:  [x]  Ambulatory                            []  Outpatient in bed                            []  Admit  Anesthesia:    []  General  []  Spinal                           [x]   Choice []   MAC   Pacemaker / ICD device []  No []  Yes   Spinal Cord Stimulator:[]  No [x]  Yes  07-01-2019 by Dr.  Yetta Barre via T9 laminectomy                      Bring Remote controller     History of Sleep Apnea? [x]  No []  Yes   CPAP used?- [x]  No []  Yes    Does the patient monitor blood sugar?          []  No []  Yes  []  N/A  Patient has: []  NO Hx DM   []  Pre-DM                 []  DM1  [x]   DM2 Does patient have a [employer] or Dexacom? []  No []  Yes   Fasting Blood Sugar Ranges-  Checks Blood Sugar _____ times a day  Diabetic medications/ instructions: Glipizide  5mg  q am,  Don't take DOS  Blood Thinner / Instructions: None Aspirin Instructions:  None  ERAS Protocol Ordered: []  No  [x]  Yes PRE-SURGERY []  ENSURE  [x]  G2  Patient is to be NPO after: 1230  Comments:   Activity level: Patient is able / unable to climb a flight of stairs without difficulty; []  No CP  []  No SOB, but would have ___   Patient can / can not perform ADLs without assistance.   Anesthesia review: HTN, DM2, Spinal cord stimulator for failed back syn. Smokes, anemia, GAD  Patient denies shortness of breath, fever, cough and chest pain at PAT appointment.  Patient verbalized understanding and agreement to the Pre-Surgical Instructions that were given to them at this PAT appointment. Patient was also educated of the  need to review these PAT instructions again prior to her surgery.I reviewed the appropriate phone numbers to call if they have any and questions or concerns.

## 2023-03-05 NOTE — Patient Instructions (Signed)
SURGICAL WAITING ROOM VISITATION Patients having surgery or a procedure may have no more than 2 support people in the waiting area - these visitors may rotate in the visitor waiting room.   Due to an increase in RSV and influenza rates and associated hospitalizations, children ages 25 and under may not visit patients in [facility] hospitals. If the patient needs to stay at the hospital during part of their recovery, the visitor guidelines for inpatient rooms apply.  PRE-OP VISITATION  Pre-op nurse will coordinate an appropriate time for 1 support person to accompany the patient in pre-op.  This support person may not rotate.  This visitor will be contacted when the time is appropriate for the visitor to come back in the pre-op area.  Please refer to the [facility] website for the visitor guidelines for Inpatients (after your surgery is over and you are in a regular room).  You are not required to quarantine at this time prior to your surgery. However, you must do this: Hand Hygiene often Do NOT share personal items Notify your provider if you are in close contact with someone who has COVID or you develop fever 100.4 or greater, new onset of sneezing, cough, sore throat, shortness of breath or body aches.  If you test positive for Covid or have been in contact with anyone that has tested positive in the last 10 days please notify you surgeon.    Your procedure is scheduled on:  Monday   March 13, 2023  Report to [facility] Main Entrance: [place] entrance where the [employer] is available.   Report to admitting at:   1:00 PM  Call this number if you have any questions or problems the morning of surgery [phone]  Do not eat food after Midnight the night prior to your surgery/procedure.  After Midnight you may have the following liquids until   12:30 PM DAY OF SURGERY  Clear Liquid Diet Water Black Coffee (sugar ok, NO MILK/CREAM OR CREAMERS)  Tea (sugar ok, NO  MILK/CREAM OR CREAMERS) regular and decaf                             Plain Jell-O  with no fruit (NO RED)                                           Fruit ices (not with fruit pulp, NO RED)                                     Popsicles (NO RED)                                                                  Juice: NO CITRUS JUICES: only apple, WHITE grape, WHITE cranberry Sports drinks like Gatorade or Powerade (NO RED)                     The day of surgery:  Drink ONE (1) Pre-Surgery G2 at   12:30 PM  the  morning of surgery. Drink in one sitting. Do not sip.  This drink was given to you during your hospital pre-op appointment visit. Nothing else to drink after completing the Pre-Surgery G2 : No candy, chewing gum or throat lozenges.    FOLLOW  ANY ADDITIONAL PRE OP INSTRUCTIONS YOU RECEIVED FROM YOUR SURGEON'S OFFICE!!!   Oral Hygiene is also important to reduce your risk of infection.        Remember - BRUSH YOUR TEETH THE MORNING OF SURGERY WITH YOUR REGULAR TOOTHPASTE  Do NOT smoke after Midnight the night before surgery.  GLIPIZIDE:  Do not take on the day of your surgery.   Take ONLY these medicines the morning of surgery with A SIP OF WATER: Omeprazole, gabapentin,  Fluoxetine (Prozac) ????                    You may not have any metal on your body including hair pins, jewelry, and body piercing  Do not wear make-up, lotions, powders, perfumes or deodorant  Do not wear nail polish including gel and S&S, artificial / acrylic nails, or any other type of covering on natural nails including finger and toenails. If you have artificial nails, gel coating, etc., that needs to be removed by a nail salon, Please have this removed prior to surgery. Not doing so may mean that your surgery could be cancelled or delayed if the Surgeon or anesthesia staff feels like they are unable to monitor you safely.   Do not shave 48 hours prior to surgery to avoid nicks in your skin which may  contribute to postoperative infections.   Contacts, Hearing Aids, dentures or bridgework may not be worn into surgery. DENTURES WILL BE REMOVED PRIOR TO SURGERY PLEASE DO NOT APPLY "Poly grip" OR ADHESIVES!!!   Patients discharged on the day of surgery will not be allowed to drive home.  Someone NEEDS to stay with you for the first 24 hours after anesthesia.  Do not bring your home medications to the hospital. The Pharmacy will dispense medications listed on your medication list to you during your admission in the Hospital.   Please read over the following fact sheets you were given: IF YOU HAVE QUESTIONS ABOUT YOUR PRE-OP INSTRUCTIONS, PLEASE CALL [phone]   [facility] - Preparing for Surgery Before surgery, you can play an important role.  Because skin is not sterile, your skin needs to be as free of germs as possible.  You can reduce the number of germs on your skin by washing with CHG (chlorahexidine gluconate) soap before surgery.  CHG is an antiseptic cleaner which kills germs and bonds with the skin to continue killing germs even after washing. Please DO NOT use if you have an allergy to CHG or antibacterial soaps.  If your skin becomes reddened/irritated stop using the CHG and inform your nurse when you arrive at Short Stay. Do not shave (including legs and underarms) for at least 48 hours prior to the first CHG shower.  You may shave your face/neck.  Please follow these instructions carefully:  1.  Shower with CHG Soap the night before surgery and the  morning of surgery.  2.  If you choose to wash your hair, wash your hair first as usual with your normal  shampoo.  3.  After you shampoo, rinse your hair and body thoroughly to remove the shampoo.  4.  Use CHG as you would any other liquid soap.  You can apply chg directly to the skin and wash.  Gently with a scrungie or clean washcloth.  5.  Apply the CHG Soap to your body ONLY FROM THE NECK DOWN.   Do  not use on face/ open                           Wound or open sores. Avoid contact with eyes, ears mouth and genitals (private parts).                       Wash face,  Genitals (private parts) with your normal soap.             6.  Wash thoroughly, paying special attention to the area where your  surgery  will be performed.  7.  Thoroughly rinse your body with warm water from the neck down.  8.  DO NOT shower/wash with your normal soap after using and rinsing off the CHG Soap.            9.  Pat yourself dry with a clean towel.            10.  Wear clean pajamas.            11.  Place clean sheets on your bed the night of your first shower and do not  sleep with pets.  ON THE DAY OF SURGERY : Do not apply any lotions/deodorants the morning of surgery.  Please wear clean clothes to the hospital/surgery center.     FAILURE TO FOLLOW THESE INSTRUCTIONS MAY RESULT IN THE CANCELLATION OF YOUR SURGERY  PATIENT SIGNATURE_________________________________  NURSE SIGNATURE__________________________________  ________________________________________________________________________

## 2023-03-06 ENCOUNTER — Encounter: Payer: Medicare HMO | Admitting: Podiatry

## 2023-03-06 ENCOUNTER — Encounter (HOSPITAL_COMMUNITY)
Admission: RE | Admit: 2023-03-06 | Discharge: 2023-03-06 | Disposition: A | Discharge status: Home / Self Care | Payer: Medicare HMO | Source: Ambulatory Visit | Attending: Orthopedic Surgery | Admitting: Orthopedic Surgery

## 2023-03-06 ENCOUNTER — Encounter (HOSPITAL_COMMUNITY): Payer: Self-pay

## 2023-03-06 ENCOUNTER — Other Ambulatory Visit: Payer: Self-pay

## 2023-03-06 VITALS — BP 147/80 | HR 76 | Temp 99.0°F | Resp 18 | Ht 63.0 in | Wt 107.0 lb

## 2023-03-06 DIAGNOSIS — I10 Essential (primary) hypertension: Secondary | ICD-10-CM | POA: Insufficient documentation

## 2023-03-06 DIAGNOSIS — Z01818 Encounter for other preprocedural examination: Secondary | ICD-10-CM | POA: Diagnosis not present

## 2023-03-06 DIAGNOSIS — E119 Type 2 diabetes mellitus without complications: Secondary | ICD-10-CM | POA: Diagnosis not present

## 2023-03-06 DIAGNOSIS — R9431 Abnormal electrocardiogram [ECG] [EKG]: Secondary | ICD-10-CM | POA: Insufficient documentation

## 2023-03-06 HISTORY — DX: Gastro-esophageal reflux disease without esophagitis: K21.9

## 2023-03-06 HISTORY — DX: Postlaminectomy syndrome, not elsewhere classified: M96.1

## 2023-03-06 LAB — BASIC METABOLIC PANEL
Anion gap: 8 (ref 5–15)
BUN: 15 mg/dL (ref 8–23)
CO2: 25 mmol/L (ref 22–32)
Calcium: 9 mg/dL (ref 8.9–10.3)
Chloride: 107 mmol/L (ref 98–111)
Creatinine, Ser: 0.85 mg/dL (ref 0.44–1.00)
GFR, Estimated: >60 mL/min (ref >60)
Glucose, Bld: 144 mg/dL — ABNORMAL HIGH (ref 70–99)
Potassium: 4.3 mmol/L (ref 3.5–5.1)
Sodium: 140 mmol/L (ref 135–145)

## 2023-03-06 LAB — HEMOGLOBIN A1C
Hgb A1c MFr Bld: 6.9 % — ABNORMAL HIGH (ref 4.8–5.6)
Mean Plasma Glucose: 151.33 mg/dL

## 2023-03-06 LAB — CBC
HCT: 38.6 % (ref 36.0–46.0)
Hemoglobin: 12.5 g/dL (ref 12.0–15.0)
MCH: 30.4 pg (ref 26.0–34.0)
MCHC: 32.4 g/dL (ref 30.0–36.0)
MCV: 93.9 fL (ref 80.0–100.0)
Platelets: 321 10*3/uL (ref 150–400)
RBC: 4.11 MIL/uL (ref 3.87–5.11)
RDW: 14.2 % (ref 11.5–15.5)
WBC: 11.7 10*3/uL — ABNORMAL HIGH (ref 4.0–10.5)
nRBC: 0 % (ref 0.0–0.2)

## 2023-03-06 LAB — GLUCOSE, CAPILLARY: Glucose-Capillary: 156 mg/dL — ABNORMAL HIGH (ref 70–99)

## 2023-03-06 NOTE — H&P (Signed)
H&P  Subjective:  Chief Complaint: Left hip pain  HPI: Roberta Clark, 62 y.o. female, presents to the clinic for a recheck of her left hip pain. The patient reports that despite previous radiographs demonstrating no issues with the total hip prosthesis, she continues to experience significant discomfort. She recalls an ultrasound revealing a tear, which she suspects could be contributing to her pain. The pain is constant and localized on the side of her hip, extending into her groin. She has received injections in the past, which provided temporary relief. The patient mentions that the pain intensifies when she ascends stairs or slopes, and it persists throughout the day. She has tried using a heating pad for relief, but it has not been effective. The patient expresses her struggle to engage in activities such as playing with her grandchildren due to the pain.  Patient Active Problem List   Diagnosis Date Noted   Incontinence of feces 01/22/2023   Chronic left SI joint pain 01/22/2023   Lack of access to adequate food 12/04/2022   Syncope 12/04/2022   Carpal tunnel syndrome 10/11/2021   Paresthesia of hand 10/11/2021   Osteoporosis without current pathological fracture 06/15/2021   Pain of left hip joint 03/22/2021   S/P insertion of spinal cord stimulator 07/01/2019   Low back pain with left-sided sciatica    Long term use of drug 01/24/2019   Foot pain, bilateral 12/04/2018   Trochanteric bursitis of left hip 08/29/2018   Numbness of lower limb 12/11/2017   Inflammation of sacroiliac joint (HCC) 11/30/2017   Lumbosacral spondylosis without myelopathy 11/30/2017   Avascular necrosis of bone of hip (HCC) 08/24/2017   Avascular necrosis of femoral head, right (HCC) 02/22/2017   Avascular necrosis of femoral head, left (HCC) 10/24/2016   OA (osteoarthritis) of hip 10/24/2016   S/P lumbar laminectomy 05/27/2016   Type 2 diabetes mellitus without complication, without long-term current  use of insulin (HCC) 11/03/2015   Multilevel degenerative disc disease 10/22/2014   Chronic back pain 05/27/2014   Lumbar radiculopathy, chronic 06/14/2013   Adenomatous colon polyp 05/13/2013   Vaginal discharge 08/22/2012   Primary hypertension 07/25/2012   Preventative health care 07/25/2012   Hot flash, menopausal 07/25/2012   Chronic low back pain 07/25/2012   Fibroids 09/02/2011   Tobacco dependence 09/02/2011   Mixed hyperlipidemia 08/15/2009    Past Medical History:  Diagnosis Date   Adenomatous colon polyp 05/13/2013   Anemia    Arthritis    Chronic pain    Diabetes mellitus without complication (HCC) 2011   Type II - diet controlled   Family history of adverse reaction to anesthesia    mother - nausea   Hyperlipidemia 2011   Rash with statin:  not well controlled with Gemfibrozil   Hypertension 2011   Insomnia secondary to chronic pain    Low back pain with left-sided sciatica    Reportedly with lumbar spinal stenosis    Past Surgical History:  Procedure Laterality Date   BACK SURGERY     COLONOSCOPY     LUMBAR LAMINECTOMY/DECOMPRESSION MICRODISCECTOMY Bilateral 05/27/2016   Procedure: Left Lumbar Four-Five Laminectomy and Foraminotomy;  Surgeon: Tia Alert, MD;  Location: [facility] OR;  Service: Neurosurgery;  Laterality: Bilateral;   SPINAL CORD STIMULATOR INSERTION N/A 07/01/2019   Procedure: Spinal cord stimulator via Thoracic Nine laminectomy;  Surgeon: Tia Alert, MD;  Location: [facility] OR;  Service: Neurosurgery;  Laterality: N/A;  Spinal cord stimulator via Thoracic Nine laminectomy   TOTAL  HIP ARTHROPLASTY Left 10/24/2016   Procedure: LEFT TOTAL HIP ARTHROPLASTY ANTERIOR APPROACH;  Surgeon: Ollen Gross, MD;  Location: [facility] ORS;  Service: Orthopedics;  Laterality: Left;   TOTAL HIP ARTHROPLASTY Right 02/22/2017   Procedure: RIGHT TOTAL HIP ARTHROPLASTY ANTERIOR APPROACH;  Surgeon: Ollen Gross, MD;  Location: [facility] ORS;  Service: Orthopedics;  Laterality: Right;    TUBAL LIGATION  1985    Prior to Admission medications   Medication Sig Start Date End Date Taking? Authorizing Provider  amitriptyline (ELAVIL) 25 MG tablet TAKE ONE TABLET BY MOUTH AT BEDTIME AS NEEDED slep 08/22/22   Julieanne Manson, MD  BIOTIN PO Take by mouth.    [provider]  Blood Glucose Monitoring Suppl (ONETOUCH VERIO) w/Device KIT Check blood glucose twice daily before meals 09/21/20   Julieanne Manson, MD  Calcium Citrate-Vitamin D 500-12.5 MG-MCG CHEW 1 chew by mouth twice daily 08/03/22   Julieanne Manson, MD  diclofenac (VOLTAREN) 75 MG EC tablet Take 1 tablet (75 mg total) by mouth 2 (two) times daily. 11/04/22   Redwine, Madison A, PA-C  doxycycline (VIBRA-TABS) 100 MG tablet Take 1 tablet (100 mg total) by mouth 2 (two) times daily. ** take WITH FOOD ** 12/22/22   Lenn Sink, DPM  FLUoxetine (PROZAC) 40 MG capsule TAKE ONE CAPSULE BY MOUTH EVERY DAY 09/18/22   Julieanne Manson, MD  gabapentin (NEURONTIN) 600 MG tablet TAKE ONE TABLET BY MOUTH EVERY MORNING, ONE AT midday, AND TAKE TWO TABLETS BY MOUTH AT BEDTIME 08/22/22   Julieanne Manson, MD  glipiZIDE (GLUCOTROL XL) 5 MG 24 hr tablet Take 1 tablet (5 mg total) by mouth daily with breakfast. 12/06/22   Julieanne Manson, MD  ibuprofen (ADVIL) 800 MG tablet 1 tab by mouth daily with meal for severe pain 02/03/23   Julieanne Manson, MD  Lancets [facility] DELICA PLUS Collier Bullock) MISC USE   TO CHECK GLUCOSE TWICE DAILY BEFORE MEAL(S) 02/03/23   Julieanne Manson, MD  lidocaine (LIDODERM) 5 % Place 1 patch onto the skin daily. Remove & Discard patch within 12 hours or as directed by MD Patient not taking: Reported on 12/06/2022 11/04/22   Redwine, Madison A, PA-C  lisinopril (ZESTRIL) 10 MG tablet 1 tab by mouth daily with Lisinopril/HCTZ combination 01/20/23   Julieanne Manson, MD  lisinopril-hydrochlorothiazide (ZESTORETIC) 20-12.5 MG tablet TAKE ONE TABLET BY MOUTH EVERY DAY 06/02/22   Julieanne Manson, MD  Melatonin 3 MG SUBL Place 6 mg under the tongue at bedtime.    [provider]  Multiple Vitamin (MULTIVITAMIN ADULT PO) Take by mouth.    [provider]  Omega-3 Fatty Acids (FISH OIL) 1000 MG CAPS Take 1,000 mg by mouth 2 (two) times daily.     [provider]  omeprazole (PRILOSEC) 20 MG capsule TAKE ONE CAPSULE BY MOUTH EVERY DAY ON EMPTY stomach 08/22/22   Julieanne Manson, MD  ondansetron (ZOFRAN) 4 MG tablet Take 1 tablet (4 mg total) by mouth every 8 (eight) hours as needed for nausea or vomiting. 01/24/22   Regal, Kirstie Peri, DPM  ONETOUCH VERIO test strip USE  STRIP TO CHECK GLUCOSE TWICE DAILY BEFORE MEAL(S) 09/21/20   Julieanne Manson, MD  potassium chloride (KLOR-CON M) 10 MEQ tablet TAKE ONE TABLET BY MOUTH EVERY DAY 09/18/22   Julieanne Manson, MD  psyllium (METAMUCIL SMOOTH TEXTURE) 58.6 % powder 1 packet in 8 oz water once daily Patient not taking: Reported on 12/06/2022 08/03/22   Julieanne Manson, MD  rosuvastatin (CRESTOR) 20 MG tablet  TAKE ONE TABLET BY MOUTH EVERY evening 10/20/22   Julieanne Manson, MD  zoledronic acid (RECLAST) 5 MG/100ML SOLN injection 5 mg IV every 2 years as of January 19 2023 with Dr. Sharl Ma. 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  Tobacco Use   Smoking status: Some Days    Current packs/day: 0.25    Average packs/day: 0.3 packs/day for 46.2 years (11.6 ttl pk-yrs)    Types: Cigarettes    Start date: 12/17/1976   Smokeless tobacco: Never   Tobacco comments:    3 daily  Vaping Use    Vaping status: Never Used  Substance and Sexual Activity   Alcohol use: No    Alcohol/week: 0.0 standard drinks of alcohol   Drug use: No   Sexual activity: Yes    Birth control/protection: Post-menopausal  Other Topics Concern   Not on file  Social History Narrative   Lives alone   Has a significant other--Bernard Olds, who is also a patient.   Children live in town and are supportive.   Originally from [place].   Went to [employer] [place].   Social Determinants of Health   Financial Resource Strain: Low Risk  (06/12/2019)   Overall Financial Resource Strain (CARDIA)    Difficulty of Paying Living Expenses: Not very hard  Food Insecurity: No Food Insecurity (06/17/2020)   Hunger Vital Sign    Worried About Running Out of Food in the Last Year: Never true    Ran Out of Food in the Last Year: Never true  Transportation Needs: No Transportation Needs (06/17/2020)   PRAPARE - Administrator, Civil Service (Medical): No    Lack of Transportation (Non-Medical): No  Physical Activity: Not on file  Stress: Not on file  Social Connections: Unknown (06/12/2019)   Social Connection and Isolation Panel [NHANES]    Frequency of Communication with Friends and Family: Not on file    Frequency of Social Gatherings with Friends and Family: Not on file    Attends Religious Services: Not on file    Active Member of Clubs or Organizations: Not on file    Attends Banker Meetings: Not on file    Marital Status: Never married  Intimate Partner Violence: Not At Risk (06/17/2020)   Humiliation, Afraid, Rape, and Kick questionnaire    Fear of Current or Ex-Partner: No    Emotionally Abused: No    Physically Abused: No    Sexually Abused: No    Tobacco Use: High Risk (12/26/2022)   Patient History    Smoking Tobacco Use: Some Days    Smokeless Tobacco Use: Never    Passive Exposure: Not on file   Social History   Substance and Sexual Activity  Alcohol Use No    Alcohol/week: 0.0 standard drinks of alcohol    Family History  Problem Relation Age of Onset   Heart disease Mother 35       Hx of MI    Hypertension Mother    Diabetes Mother    Lupus Father  Diabetes Sister    Luiz Blare' disease Sister    GER disease Sister    Arthritis Daughter        Not clear if RA   Hypertension Daughter    Migraines Daughter    Luiz Blare' disease Brother    Diabetes Brother    Other Brother        COVID   Epilepsy Son        Started 87 months of age after bacterial meningitis   Breast cancer Neg Hx     Review of Systems  Constitutional:  Negative for chills and fever.  HENT:  Negative for congestion, sore throat and tinnitus.   Eyes:  Negative for double vision, photophobia and pain.  Respiratory:  Negative for cough, shortness of breath and wheezing.   Cardiovascular:  Negative for chest pain, palpitations and orthopnea.  Gastrointestinal:  Negative for heartburn, nausea and vomiting.  Genitourinary:  Negative for dysuria, frequency and urgency.  Musculoskeletal:  Positive for joint pain.  Neurological:  Negative for dizziness, weakness and headaches.     Objective:  Physical Exam: Well-developed female alert and oriented in no apparent distress  Her left hip shows no warmth or swelling Flexion 120, IR30, ER 30 and abductio 40 without pain She is exquisitely tender over the greater trochanter Her gait pattern is antalgic on the left  Imaging Review She has had MRIs done in the past which did not show a gluteal tendon tear, but an ultrasound done in January did show a tear.  Assessment/Plan:   ASSESSMENT:  Haeven has been dealing with lateral-based hip pain for several years now. She has only responded well short-term to injections. She has had MRIs done in the past which did not show a gluteal tendon tear, but an ultrasound done in January did show a tear. The most recent injection was not beneficial. She has a total hip there and there have  been no periprosthetic issues. At this point it is felt that a bursectomy and gluteal tendon repair are the next appropriate step given the intractable nature of her pain.    PLAN:  Given the persistent and intractable nature of Donna's pain, surgical intervention is recommended. The proposed plan includes a bursectomy and repair of the gluteal tendon tear. The procedure will be performed on an outpatient basis, allowing Lea to return home the same day. Post-operative care will involve restrictions on certain movements for six weeks, including leading with the affected leg when climbing stairs and kicking out to the side. After six weeks, she can resume all activities. Tyonna has agreed to proceed with the surgery.     Arther Abbott, PA-C Orthopedic Surgery [facility] [place]

## 2023-03-07 ENCOUNTER — Other Ambulatory Visit: Payer: Self-pay

## 2023-03-07 MED ORDER — LISINOPRIL-HYDROCHLOROTHIAZIDE 20-12.5 MG PO TABS: 1.0000 | ORAL_TABLET | Freq: Every day | ORAL | 0 refills | Status: DC

## 2023-03-09 ENCOUNTER — Ambulatory Visit: Payer: Medicare HMO | Admitting: Internal Medicine

## 2023-03-09 ENCOUNTER — Encounter: Payer: Self-pay | Admitting: Internal Medicine

## 2023-03-09 ENCOUNTER — Other Ambulatory Visit: Payer: Self-pay | Admitting: Internal Medicine

## 2023-03-09 VITALS — BP 150/80 | HR 72 | Resp 16 | Ht 63.0 in | Wt 110.0 lb

## 2023-03-09 DIAGNOSIS — Z23 Encounter for immunization: Secondary | ICD-10-CM

## 2023-03-09 DIAGNOSIS — E782 Mixed hyperlipidemia: Secondary | ICD-10-CM | POA: Diagnosis not present

## 2023-03-09 DIAGNOSIS — E119 Type 2 diabetes mellitus without complications: Secondary | ICD-10-CM

## 2023-03-09 DIAGNOSIS — G8929 Other chronic pain: Secondary | ICD-10-CM

## 2023-03-09 DIAGNOSIS — I10 Essential (primary) hypertension: Secondary | ICD-10-CM

## 2023-03-09 DIAGNOSIS — M25552 Pain in left hip: Secondary | ICD-10-CM

## 2023-03-09 MED ORDER — GABAPENTIN 600 MG PO TABS: ORAL_TABLET | ORAL | 3 refills | Status: DC

## 2023-03-09 MED ORDER — CALCIUM CITRATE-VITAMIN D 500-12.5 MG-MCG PO CHEW: CHEWABLE_TABLET | ORAL | 3 refills | Status: AC

## 2023-03-09 MED ORDER — AMITRIPTYLINE HCL 25 MG PO TABS: ORAL_TABLET | ORAL | 3 refills | Status: DC

## 2023-03-09 MED ORDER — LISINOPRIL-HYDROCHLOROTHIAZIDE 20-12.5 MG PO TABS: ORAL_TABLET | ORAL | 3 refills | Status: DC

## 2023-03-09 MED ORDER — ONETOUCH DELICA PLUS LANCET33G MISC: 3 refills | Status: DC

## 2023-03-09 MED ORDER — ROSUVASTATIN CALCIUM 20 MG PO TABS: ORAL_TABLET | ORAL | 3 refills | Status: DC

## 2023-03-09 MED ORDER — ONETOUCH VERIO VI STRP
ORAL_STRIP | 3 refills | Status: DC
Start: 2023-03-09 — End: 2024-05-01

## 2023-03-09 MED ORDER — LISINOPRIL 10 MG PO TABS: ORAL_TABLET | ORAL | 3 refills | Status: DC

## 2023-03-09 MED ORDER — GLIPIZIDE ER 5 MG PO TB24: 5.0000 mg | ORAL_TABLET | Freq: Every day | ORAL | 3 refills | Status: DC

## 2023-03-09 MED ORDER — OMEPRAZOLE 20 MG PO CPDR: DELAYED_RELEASE_CAPSULE | ORAL | 3 refills | Status: DC

## 2023-03-09 NOTE — Progress Notes (Unsigned)
Subjective:    Patient ID: Roberta Clark, female   DOB: [birth year], 62 y.o.   MRN: [id number]   HPI   Hypertension:  ran out of Lisinopril/hydrochlorothiazide this morning.  Only missed one dose.    2.  Chronic pain:  neck/hip/back:  doing better with increase to max dose of Gabapentin 1200 mg 3 times daily.  She is having removal of bursa, left greater trochanter and repair of gluteal tendon with Dr. Despina Hick next Monday, outpatient.  Was found on ultrasound of hip.  3.   DM:   A1C is still at goal at 6.9%.  Has gained a bit of weight.  Eating more.  She will pay attention to what she is eating a bit more.   4.  Hyperlipidemia:  discussed LDL gradually increasing and way above goal and her total also too high.  Not physically active with hip pain and eating more.    Current Meds  Medication Sig   amitriptyline (ELAVIL) 25 MG tablet TAKE ONE TABLET BY MOUTH AT BEDTIME AS NEEDED slep (Patient taking differently: Take 25 mg by mouth at bedtime as needed for sleep.)   BIOTIN PO Take by mouth.   Blood Glucose Monitoring Suppl (ONETOUCH VERIO) w/Device KIT Check blood glucose twice daily before meals   Calcium Citrate-Vitamin D 500-12.5 MG-MCG CHEW 1 chew by mouth twice daily   doxycycline (VIBRA-TABS) 100 MG tablet Take 1 tablet (100 mg total) by mouth 2 (two) times daily. ** take WITH FOOD **   gabapentin (NEURONTIN) 600 MG tablet TAKE ONE TABLET BY MOUTH EVERY MORNING, ONE AT midday, AND TAKE TWO TABLETS BY MOUTH AT BEDTIME (Patient taking differently: Take 1,200 mg by mouth 3 (three) times daily.)   glipiZIDE (GLUCOTROL XL) 5 MG 24 hr tablet Take 1 tablet (5 mg total) by mouth daily with breakfast.   ibuprofen (ADVIL) 800 MG tablet 1 tab by mouth daily with meal for severe pain (Patient taking differently: as needed. 1 tab by mouth daily with meal for severe pain)   Lancets (ONETOUCH DELICA PLUS LANCET33G) MISC USE   TO CHECK GLUCOSE TWICE DAILY BEFORE MEAL(S)   lisinopril (ZESTRIL) 10 MG  tablet 1 tab by mouth daily with Lisinopril/HCTZ combination (Patient taking differently: Take 10 mg by mouth daily. 1 tab by mouth daily with Lisinopril/HCTZ combination)   lisinopril-hydrochlorothiazide (ZESTORETIC) 20-12.5 MG tablet Take 1 tablet by mouth daily.   Melatonin 3 MG SUBL Take 6 mg by mouth at bedtime.   methocarbamol (ROBAXIN) 500 MG tablet Take 500 mg by mouth every 8 (eight) hours as needed.   Multiple Vitamin (MULTIVITAMIN ADULT PO) Take by mouth.   Omega-3 Fatty Acids (FISH OIL) 1000 MG CAPS Take 1,000 mg by mouth 2 (two) times daily.    omeprazole (PRILOSEC) 20 MG capsule TAKE ONE CAPSULE BY MOUTH EVERY DAY ON EMPTY stomach (Patient taking differently: Take 20 mg by mouth daily.)   ONETOUCH VERIO test strip USE  STRIP TO CHECK GLUCOSE TWICE DAILY BEFORE MEAL(S)   potassium chloride (KLOR-CON M) 10 MEQ tablet TAKE ONE TABLET BY MOUTH EVERY DAY   psyllium (METAMUCIL SMOOTH TEXTURE) 58.6 % powder 1 packet in 8 oz water once daily   rosuvastatin (CRESTOR) 20 MG tablet TAKE ONE TABLET BY MOUTH EVERY evening (Patient taking differently: Take 20 mg by mouth daily.)   Allergies  Allergen Reactions   Atorvastatin Rash   Metformin And Related Nausea And Vomiting     Review of Systems  Objective:   BP (!) 150/80 (BP Location: Right Arm, Patient Position: Sitting, Cuff Size: Normal)   Pulse 72   Resp 16   Ht 5\' 3"  (1.6 m)   Wt 110 lb (49.9 kg)   BMI 19.49 kg/m   Physical Exam   Assessment & Plan

## 2023-03-09 NOTE — Patient Instructions (Signed)
Call when ready to get COvID vaccine, please call and set up.

## 2023-03-13 ENCOUNTER — Ambulatory Visit (HOSPITAL_COMMUNITY): Payer: Medicare HMO | Admitting: Anesthesiology

## 2023-03-13 ENCOUNTER — Other Ambulatory Visit: Payer: Self-pay

## 2023-03-13 ENCOUNTER — Ambulatory Visit (HOSPITAL_COMMUNITY)
Admission: RE | Admit: 2023-03-13 | Discharge: 2023-03-13 | Disposition: A | Discharge status: Home / Self Care | Payer: Medicare HMO | Source: Home / Self Care | Attending: Orthopedic Surgery | Admitting: Orthopedic Surgery

## 2023-03-13 ENCOUNTER — Ambulatory Visit (HOSPITAL_BASED_OUTPATIENT_CLINIC_OR_DEPARTMENT_OTHER): Payer: Medicare HMO | Admitting: Anesthesiology

## 2023-03-13 ENCOUNTER — Encounter (HOSPITAL_COMMUNITY): Payer: Self-pay | Admitting: Orthopedic Surgery

## 2023-03-13 ENCOUNTER — Encounter (HOSPITAL_COMMUNITY)
Admission: RE | Disposition: A | Discharge status: Home / Self Care | Payer: Self-pay | Source: Home / Self Care | Attending: Orthopedic Surgery

## 2023-03-13 DIAGNOSIS — I10 Essential (primary) hypertension: Secondary | ICD-10-CM | POA: Diagnosis not present

## 2023-03-13 DIAGNOSIS — M7062 Trochanteric bursitis, left hip: Secondary | ICD-10-CM | POA: Diagnosis present

## 2023-03-13 DIAGNOSIS — F17210 Nicotine dependence, cigarettes, uncomplicated: Secondary | ICD-10-CM | POA: Insufficient documentation

## 2023-03-13 DIAGNOSIS — Z96643 Presence of artificial hip joint, bilateral: Secondary | ICD-10-CM | POA: Insufficient documentation

## 2023-03-13 DIAGNOSIS — K219 Gastro-esophageal reflux disease without esophagitis: Secondary | ICD-10-CM | POA: Diagnosis not present

## 2023-03-13 DIAGNOSIS — M1990 Unspecified osteoarthritis, unspecified site: Secondary | ICD-10-CM | POA: Insufficient documentation

## 2023-03-13 DIAGNOSIS — E119 Type 2 diabetes mellitus without complications: Secondary | ICD-10-CM | POA: Insufficient documentation

## 2023-03-13 DIAGNOSIS — Z7984 Long term (current) use of oral hypoglycemic drugs: Secondary | ICD-10-CM | POA: Diagnosis not present

## 2023-03-13 DIAGNOSIS — M7602 Gluteal tendinitis, left hip: Secondary | ICD-10-CM | POA: Diagnosis not present

## 2023-03-13 DIAGNOSIS — M7072 Other bursitis of hip, left hip: Secondary | ICD-10-CM

## 2023-03-13 DIAGNOSIS — Z79899 Other long term (current) drug therapy: Secondary | ICD-10-CM | POA: Insufficient documentation

## 2023-03-13 HISTORY — PX: OPEN SURGICAL REPAIR OF GLUTEAL TENDON: SHX5995

## 2023-03-13 HISTORY — PX: EXCISION/RELEASE BURSA HIP: SHX5014

## 2023-03-13 LAB — GLUCOSE, CAPILLARY
Glucose-Capillary: 107 mg/dL — ABNORMAL HIGH (ref 70–99)
Glucose-Capillary: 102 mg/dL — ABNORMAL HIGH (ref 70–99)

## 2023-03-13 SURGERY — RELEASE, BURSA, TROCHANTERIC
Anesthesia: General | Site: Hip | Laterality: Left

## 2023-03-13 MED ORDER — BUPIVACAINE-EPINEPHRINE (PF) 0.25% -1:200000 IJ SOLN
INTRAMUSCULAR | Status: DC | PRN
  Administered 2023-03-13: 25 mL

## 2023-03-13 MED ORDER — HYDROMORPHONE HCL 1 MG/ML IJ SOLN
0.2500 mg | INTRAMUSCULAR | Status: DC | PRN
  Administered 2023-03-13 (×3): 0.5 mg via INTRAVENOUS

## 2023-03-13 MED ORDER — LACTATED RINGERS IV SOLN: INTRAVENOUS | Status: DC

## 2023-03-13 MED ORDER — DEXAMETHASONE SODIUM PHOSPHATE 10 MG/ML IJ SOLN
8.0000 mg | Freq: Once | INTRAMUSCULAR | Status: AC
  Administered 2023-03-13: 10 mg via INTRAVENOUS

## 2023-03-13 MED ORDER — 0.9 % SODIUM CHLORIDE (POUR BTL) OPTIME
TOPICAL | Status: DC | PRN
  Administered 2023-03-13: 1000 mL

## 2023-03-13 MED ORDER — AMISULPRIDE (ANTIEMETIC) 5 MG/2ML IV SOLN: 10.0000 mg | Freq: Once | INTRAVENOUS | Status: DC | PRN

## 2023-03-13 MED ORDER — HYDROCODONE-ACETAMINOPHEN 5-325 MG PO TABS: 1.0000 | ORAL_TABLET | Freq: Four times a day (QID) | ORAL | 0 refills | Status: AC | PRN

## 2023-03-13 MED ORDER — OXYCODONE HCL 5 MG PO TABS: 5.0000 mg | ORAL_TABLET | Freq: Once | ORAL | Status: DC | PRN

## 2023-03-13 MED ORDER — PROPOFOL 10 MG/ML IV BOLUS
INTRAVENOUS | Status: AC
  Filled 2023-03-13: qty 20

## 2023-03-13 MED ORDER — MIDAZOLAM HCL 2 MG/2ML IJ SOLN
INTRAMUSCULAR | Status: AC
  Filled 2023-03-13: qty 2

## 2023-03-13 MED ORDER — CHLORHEXIDINE GLUCONATE 0.12 % MT SOLN
15.0000 mL | Freq: Once | OROMUCOSAL | Status: AC
  Administered 2023-03-13: 15 mL via OROMUCOSAL

## 2023-03-13 MED ORDER — EPHEDRINE SULFATE-NACL 50-0.9 MG/10ML-% IV SOSY
PREFILLED_SYRINGE | INTRAVENOUS | Status: DC | PRN
  Administered 2023-03-13: 10 mg via INTRAVENOUS

## 2023-03-13 MED ORDER — MIDAZOLAM HCL 5 MG/5ML IJ SOLN
INTRAMUSCULAR | Status: DC | PRN
  Administered 2023-03-13 (×2): 2 mg via INTRAVENOUS

## 2023-03-13 MED ORDER — KETOROLAC TROMETHAMINE 30 MG/ML IJ SOLN
INTRAMUSCULAR | Status: DC | PRN
  Administered 2023-03-13: 30 mg via INTRAVENOUS

## 2023-03-13 MED ORDER — ONDANSETRON HCL 4 MG/2ML IJ SOLN
INTRAMUSCULAR | Status: AC
  Filled 2023-03-13: qty 2

## 2023-03-13 MED ORDER — KETOROLAC TROMETHAMINE 30 MG/ML IJ SOLN
INTRAMUSCULAR | Status: AC
  Filled 2023-03-13: qty 1

## 2023-03-13 MED ORDER — OXYCODONE HCL 5 MG/5ML PO SOLN: 5.0000 mg | Freq: Once | ORAL | Status: DC | PRN

## 2023-03-13 MED ORDER — ROCURONIUM BROMIDE 10 MG/ML (PF) SYRINGE
PREFILLED_SYRINGE | INTRAVENOUS | Status: DC | PRN
  Administered 2023-03-13: 40 mg via INTRAVENOUS

## 2023-03-13 MED ORDER — FENTANYL CITRATE (PF) 100 MCG/2ML IJ SOLN
INTRAMUSCULAR | Status: AC
  Filled 2023-03-13: qty 2

## 2023-03-13 MED ORDER — ASPIRIN 81 MG PO TBEC: 81.0000 mg | DELAYED_RELEASE_TABLET | Freq: Every day | ORAL | 0 refills | Status: AC

## 2023-03-13 MED ORDER — ONDANSETRON HCL 4 MG/2ML IJ SOLN
INTRAMUSCULAR | Status: DC | PRN
  Administered 2023-03-13: 4 mg via INTRAVENOUS

## 2023-03-13 MED ORDER — FENTANYL CITRATE (PF) 100 MCG/2ML IJ SOLN
INTRAMUSCULAR | Status: DC | PRN
  Administered 2023-03-13 (×4): 50 ug via INTRAVENOUS

## 2023-03-13 MED ORDER — BUPIVACAINE-EPINEPHRINE 0.25% -1:200000 IJ SOLN
INTRAMUSCULAR | Status: AC
  Filled 2023-03-13: qty 1

## 2023-03-13 MED ORDER — INSULIN ASPART 100 UNIT/ML IJ SOLN: 0.0000 [IU] | INTRAMUSCULAR | Status: DC | PRN

## 2023-03-13 MED ORDER — ACETAMINOPHEN 10 MG/ML IV SOLN
1000.0000 mg | Freq: Four times a day (QID) | INTRAVENOUS | Status: DC
  Administered 2023-03-13: 1000 mg via INTRAVENOUS
  Filled 2023-03-13: qty 100

## 2023-03-13 MED ORDER — ACETAMINOPHEN 10 MG/ML IV SOLN: 650.0000 mg | Freq: Once | INTRAVENOUS | Status: DC | PRN

## 2023-03-13 MED ORDER — FENTANYL CITRATE PF 50 MCG/ML IJ SOSY: 25.0000 ug | PREFILLED_SYRINGE | INTRAMUSCULAR | Status: DC | PRN

## 2023-03-13 MED ORDER — METHOCARBAMOL 500 MG PO TABS: 500.0000 mg | ORAL_TABLET | Freq: Four times a day (QID) | ORAL | 0 refills | Status: DC | PRN

## 2023-03-13 MED ORDER — ROCURONIUM BROMIDE 10 MG/ML (PF) SYRINGE
PREFILLED_SYRINGE | INTRAVENOUS | Status: AC
  Filled 2023-03-13: qty 10

## 2023-03-13 MED ORDER — ORAL CARE MOUTH RINSE: 15.0000 mL | Freq: Once | OROMUCOSAL | Status: AC

## 2023-03-13 MED ORDER — LIDOCAINE 2% (20 MG/ML) 5 ML SYRINGE
INTRAMUSCULAR | Status: DC | PRN
  Administered 2023-03-13: 60 mg via INTRAVENOUS

## 2023-03-13 MED ORDER — PROPOFOL 10 MG/ML IV BOLUS
INTRAVENOUS | Status: DC | PRN
Start: 2023-03-13 — End: 2023-03-13
  Administered 2023-03-13: 100 mg via INTRAVENOUS

## 2023-03-13 MED ORDER — DEXAMETHASONE SODIUM PHOSPHATE 10 MG/ML IJ SOLN
INTRAMUSCULAR | Status: AC
  Filled 2023-03-13: qty 1

## 2023-03-13 MED ORDER — HYDROMORPHONE HCL 1 MG/ML IJ SOLN
INTRAMUSCULAR | Status: AC
  Administered 2023-03-13: 0.5 mg via INTRAVENOUS
  Filled 2023-03-13: qty 2

## 2023-03-13 MED ORDER — SUGAMMADEX SODIUM 500 MG/5ML IV SOLN
INTRAVENOUS | Status: DC | PRN
  Administered 2023-03-13 (×2): 100 mg via INTRAVENOUS

## 2023-03-13 MED ORDER — CEFAZOLIN SODIUM-DEXTROSE 2-4 GM/100ML-% IV SOLN
2.0000 g | INTRAVENOUS | Status: AC
  Administered 2023-03-13: 2 g via INTRAVENOUS
  Filled 2023-03-13: qty 100

## 2023-03-13 SURGICAL SUPPLY — 58 items
ADH SKN CLS APL DERMABOND .7 (GAUZE/BANDAGES/DRESSINGS) ×1
ANCH SUT SWLK 19.1X4.75 (Anchor) ×1 IMPLANT
ANCHOR SUT BIO SW 4.75X19.1 (Anchor) IMPLANT
BAG COUNTER SPONGE SURGICOUNT (BAG) IMPLANT
BAG SPEC THK2 15X12 ZIP CLS (MISCELLANEOUS)
BAG SPNG CNTER NS LX DISP (BAG)
BAG ZIPLOCK 12X15 (MISCELLANEOUS) IMPLANT
BIT DRILL 2.4X128 (BIT) IMPLANT
BIT DRILL 2.8X128 (BIT) IMPLANT
BLADE EXTENDED COATED 6.5IN (ELECTRODE) ×1 IMPLANT
COVER SURGICAL LIGHT HANDLE (MISCELLANEOUS) ×1 IMPLANT
DERMABOND ADVANCED .7 DNX12 (GAUZE/BANDAGES/DRESSINGS) IMPLANT
DRAPE INCISE IOBAN 66X45 STRL (DRAPES) ×1 IMPLANT
DRAPE ORTHO SPLIT 77X108 STRL (DRAPES) ×2
DRAPE POUCH INSTRU U-SHP 10X18 (DRAPES) ×1 IMPLANT
DRAPE SURG ORHT 6 SPLT 77X108 (DRAPES) ×2 IMPLANT
DRAPE U-SHAPE 47X51 STRL (DRAPES) ×1 IMPLANT
DRESSING MEPILEX FLEX 4X4 (GAUZE/BANDAGES/DRESSINGS) IMPLANT
DRSG ADAPTIC 3X8 NADH LF (GAUZE/BANDAGES/DRESSINGS) ×1 IMPLANT
DRSG AQUACEL AG ADV 3.5X 6 (GAUZE/BANDAGES/DRESSINGS) IMPLANT
DRSG AQUACEL AG ADV 3.5X10 (GAUZE/BANDAGES/DRESSINGS) ×1 IMPLANT
DRSG MEPILEX FLEX 4X4 (GAUZE/BANDAGES/DRESSINGS)
DURAPREP 26ML APPLICATOR (WOUND CARE) ×1 IMPLANT
ELECT REM PT RETURN 15FT ADLT (MISCELLANEOUS) ×1 IMPLANT
EVACUATOR 1/8 PVC DRAIN (DRAIN) IMPLANT
FACESHIELD WRAPAROUND (MASK) ×2 IMPLANT
FACESHIELD WRAPAROUND OR TEAM (MASK) ×2 IMPLANT
GAUZE SPONGE 4X4 12PLY STRL (GAUZE/BANDAGES/DRESSINGS) ×1 IMPLANT
GLOVE BIO SURGEON STRL SZ 6.5 (GLOVE) IMPLANT
GLOVE BIO SURGEON STRL SZ7.5 (GLOVE) IMPLANT
GLOVE BIO SURGEON STRL SZ8 (GLOVE) ×1 IMPLANT
GLOVE BIOGEL PI IND STRL 6.5 (GLOVE) IMPLANT
GLOVE BIOGEL PI IND STRL 7.0 (GLOVE) ×1 IMPLANT
GLOVE BIOGEL PI IND STRL 8 (GLOVE) ×1 IMPLANT
GOWN STRL REUS W/ TWL LRG LVL3 (GOWN DISPOSABLE) ×2 IMPLANT
GOWN STRL REUS W/TWL LRG LVL3 (GOWN DISPOSABLE) ×2
KIT BASIN OR (CUSTOM PROCEDURE TRAY) ×1 IMPLANT
KIT TURNOVER KIT A (KITS) IMPLANT
MANIFOLD NEPTUNE II (INSTRUMENTS) ×1 IMPLANT
NDL MA TROC 1/2 (NEEDLE) ×1 IMPLANT
NDL SAFETY ECLIP 18X1.5 (MISCELLANEOUS) ×2 IMPLANT
NEEDLE MA TROC 1/2 (NEEDLE) ×1 IMPLANT
NS IRRIG 1000ML POUR BTL (IV SOLUTION) ×1 IMPLANT
PACK TOTAL JOINT (CUSTOM PROCEDURE TRAY) ×1 IMPLANT
PASSER SUT SWANSON 36MM LOOP (INSTRUMENTS) IMPLANT
PROTECTOR NERVE ULNAR (MISCELLANEOUS) ×1 IMPLANT
STAPLER VISISTAT 35W (STAPLE) IMPLANT
STRIP CLOSURE SKIN 1/2X4 (GAUZE/BANDAGES/DRESSINGS) ×2 IMPLANT
SUT ETHIBOND NAB CT1 #1 30IN (SUTURE) IMPLANT
SUT MNCRL AB 4-0 PS2 18 (SUTURE) ×1 IMPLANT
SUT STRATAFIX 0 PDS 27 VIOLET (SUTURE) ×1
SUT VIC AB 2-0 CT1 27 (SUTURE) ×2
SUT VIC AB 2-0 CT1 TAPERPNT 27 (SUTURE) ×2 IMPLANT
SUTURE STRATFX 0 PDS 27 VIOLET (SUTURE) ×1 IMPLANT
SUTURE TAPE 1.3 40 TPR END (SUTURE) IMPLANT
SUTURETAPE 1.3 40 TPR END (SUTURE) ×1
SYR 50ML LL SCALE MARK (SYRINGE) ×1 IMPLANT
TOWEL OR 17X26 10 PK STRL BLUE (TOWEL DISPOSABLE) ×2 IMPLANT

## 2023-03-13 NOTE — Discharge Instructions (Addendum)
Ollen Gross, MD [facility] [place] [address], [address] [place], [place] [address] [phone]  BURSECTOMY / GLUTEAL TENDON REPAIR POSTOPERATIVE INSTRUCTIONS  BLOOD CLOT PREVENTION Take 81 mg Aspirin once a day for three weeks following surgery. You may resume your vitamins/supplements upon discharge from the hospital.  HOME CARE INSTRUCTIONS  Remove items at home which could result in a fall. This includes throw rugs or furniture in walking pathways.  ICE to the affected hip every three hours for 30 minutes at a time and then as needed for pain and swelling. Continue to use ice on the hip for pain and swelling from surgery. You may notice swelling that will progress down to the foot and ankle. This is normal after surgery. Elevate the leg when you are not up walking on it.   Continue to use the breathing machine which will help keep your temperature down. It is common for your temperature to cycle up and down following surgery, especially at night when you are not up moving around and exerting yourself. The breathing machine keeps your lungs expanded and your temperature down. Sit on high chairs which makes it easier to stand.  Sit on chairs with arms. Use the chair arms to help push yourself up when arising.  No active abduction of the leg (do not pull leg out to the side away from the body). Use your walker for first several days until comfortable ambulating.  DIET You may resume your previous home diet once you are discharged from the hospital.  DRESSING / WOUND CARE / SHOWERING You will have an adhesive waterproof bandage over the incision. Leave this in place until your first follow-up appointment. You may shower three days after surgery, but do not submerge the incision under water.   ACTIVITY Walk with your walker as instructed. Use walker as long as suggested by your caregivers. Avoid periods of inactivity such as sitting longer than  an hour when not asleep. This helps prevent blood clots.  You may resume a sexual relationship in one month or when given the OK by your doctor.  You may return to work once you are cleared by your doctor.  Do not drive a car for 6 weeks or until released by you surgeon.  Do not drive while taking narcotics.  WEIGHT BEARING Weight bearing as tolerated with assist device (walker, cane, etc) as directed, use it until comfortable ambulating.  POSTOPERATIVE CONSTIPATION PROTOCOL Constipation - defined medically as fewer than three stools per week and severe constipation as less than one stool per week.  One of the most common issues patients have following surgery is constipation.  Even if you have a regular bowel pattern at home, your normal regimen is likely to be disrupted due to multiple reasons following surgery.  Combination of anesthesia, postoperative narcotics, change in appetite and fluid intake all can affect your bowels.  In order to avoid complications following surgery, here are some recommendations in order to help you during your recovery period.  Colace (docusate) - Pick up an over-the-counter form of Colace or another stool softener and take twice a day as long as you are requiring postoperative pain medications.  Take with a full glass of water daily.  If you experience loose stools or diarrhea, hold the colace until you stool forms back up.  If your symptoms do not get better within 1 week or if they get worse, check with your doctor.  Dulcolax (bisacodyl) - Pick  up over-the-counter and take as directed by the product packaging as needed to assist with the movement of your bowels.  Take with a full glass of water.  Use this product as needed if not relieved by Colace only.   MiraLax (polyethylene glycol) - Pick up over-the-counter to have on hand.  MiraLax is a solution that will increase the amount of water in your bowels to assist with bowel movements.  Take as directed and can  mix with a glass of water, juice, soda, coffee, or tea.  Take if you go more than two days without a movement. Do not use MiraLax more than once per day. Call your doctor if you are still constipated or irregular after using this medication for 7 days in a row.  If you continue to have problems with postoperative constipation, please contact the office for further assistance and recommendations.  If you experience "the worst abdominal pain ever" or develop nausea or vomiting, please contact the office immediatly for further recommendations for treatment.  ITCHING  If you experience itching with your medications, try taking only a single pain pill, or even half a pain pill at a time.  You can also use Benadryl over the counter for itching or also to help with sleep.   TED HOSE STOCKINGS Wear the elastic stockings on both legs for three weeks following surgery during the day but you may remove then at night for sleeping.  MEDICATIONS See your medication summary on the "After Visit Summary" that the nursing staff will review with you prior to discharge.  You may have some home medications which will be placed on hold until you complete the course of blood thinner medication.  It is important for you to complete the blood thinner medication as prescribed by your surgeon.  Continue your approved medications as instructed at time of discharge.  PRECAUTIONS If you experience chest pain or shortness of breath - call 911 immediately for transfer to the hospital emergency department.  If you develop a fever greater that 101 F, purulent drainage from wound, increased redness or drainage from wound, foul odor from the wound/dressing, or calf pain - CONTACT YOUR SURGEON.                                                   FOLLOW-UP APPOINTMENTS Make sure you keep all of your appointments after your operation with your surgeon and caregivers. You should call the office at the above phone number and make an  appointment for approximately two weeks after the date of your surgery or on the date instructed by your surgeon outlined in the "After Visit Summary".   Pick up stool softner and laxative for home use following surgery while on pain medications. Do not submerge incision under water. Please use good hand washing techniques while changing dressing each day. May shower starting three days after surgery. Please use a clean towel to pat the incision dry following showers. Continue to use ice for pain and swelling after surgery. Do not use any lotions or creams on the incision until instructed by your surgeon.

## 2023-03-13 NOTE — Anesthesia Postprocedure Evaluation (Signed)
Anesthesia Post Note  Patient: Roberta Clark  Procedure(s) Performed: Left hip bursectomy; gluteal tendon repair (Left: Hip) OPEN REPAIR OF GLUTEAL TENDON (Left: Hip)     Patient location during evaluation: PACU Anesthesia Type: General Level of consciousness: awake and alert Pain management: pain level controlled Vital Signs Assessment: post-procedure vital signs reviewed and stable Respiratory status: spontaneous breathing, nonlabored ventilation and respiratory function stable Cardiovascular status: blood pressure returned to baseline Postop Assessment: no apparent nausea or vomiting Anesthetic complications: no   No notable events documented.  Last Vitals:  Vitals:   03/13/23 1930 03/13/23 1949  BP: (!) 143/90 (!) 152/85  Pulse: 73 96  Resp: 12 (!) 22  Temp:  36.4 C  SpO2: 96% 96%    Last Pain:  Vitals:   03/13/23 1949  TempSrc:   PainSc: 8                  Shanda Howells

## 2023-03-13 NOTE — Transfer of Care (Signed)
Immediate Anesthesia Transfer of Care Note  Patient: Roberta Clark  Procedure(s) Performed: Left hip bursectomy; gluteal tendon repair (Left: Hip) OPEN REPAIR OF GLUTEAL TENDON (Left: Hip)  Patient Location: PACU  Anesthesia Type:General  Level of Consciousness: awake, alert , and oriented  Airway & Oxygen Therapy: Patient Spontanous Breathing and Patient connected to face mask oxygen  Post-op Assessment: Report given to RN and Post -op Vital signs reviewed and stable  Post vital signs: Reviewed and stable  Last Vitals:  Vitals Value Taken Time  BP 152/101 03/13/23 1731  Temp 36.4 C 03/13/23 1723  Pulse 87 03/13/23 1733  Resp 20 03/13/23 1733  SpO2 100 % 03/13/23 1733  Vitals shown include unfiled device data.  Last Pain:  Vitals:   03/13/23 1325  TempSrc: Oral         Complications: No notable events documented.

## 2023-03-13 NOTE — Anesthesia Procedure Notes (Signed)
Procedure Name: Intubation Date/Time: 03/13/2023 4:10 PM  Performed by: Basilio Cairo, CRNAPre-anesthesia Checklist: Patient identified, Patient being monitored, Timeout performed, Emergency Drugs available and Suction available Patient Re-evaluated:Patient Re-evaluated prior to induction Oxygen Delivery Method: Circle system utilized Preoxygenation: Pre-oxygenation with 100% oxygen Induction Type: IV induction Ventilation: Mask ventilation without difficulty Laryngoscope Size: Mac and 3 Grade View: Grade I Tube type: Oral Tube size: 7.0 mm Number of attempts: 1 Airway Equipment and Method: Stylet Placement Confirmation: ETT inserted through vocal cords under direct vision, positive ETCO2 and breath sounds checked- equal and bilateral Secured at: 21 cm Tube secured with: Tape Dental Injury: Teeth and Oropharynx as per pre-operative assessment

## 2023-03-13 NOTE — Interval H&P Note (Signed)
History and Physical Interval Note:  03/13/2023 2:36 PM  Roberta Clark  has presented today for surgery, with the diagnosis of Left hip intractable bursitis; gluteal tendon tear.  The various methods of treatment have been discussed with the patient and family. After consideration of risks, benefits and other options for treatment, the patient has consented to  Procedure(s): Left hip bursectomy; gluteal tendon repair (Left) OPEN REPAIR OF GLUTEAL TENDON (Left) as a surgical intervention.  The patient's history has been reviewed, patient examined, no change in status, stable for surgery.  I have reviewed the patient's chart and labs.  Questions were answered to the patient's satisfaction.     Homero Fellers [name]

## 2023-03-13 NOTE — Brief Op Note (Signed)
03/13/2023  4:55 PM  PATIENT:  Roberta Clark  62 y.o. female  PRE-OPERATIVE DIAGNOSIS:  Left hip intractable bursitis; gluteal tendon tear  POST-OPERATIVE DIAGNOSIS:  Left hip intractable bursitis; gluteal tendon tear  PROCEDURE:  Procedure(s): Left hip bursectomy; gluteal tendon repair (Left) OPEN REPAIR OF GLUTEAL TENDON (Left)  SURGEON:  Surgeons and Role:    Ollen Gross, MD - Primary  PHYSICIAN ASSISTANT:   ASSISTANTS: Weston Brass, PA-C   ANESTHESIA:   general  EBL:  10 ml   BLOOD ADMINISTERED:none  DRAINS: none   LOCAL MEDICATIONS USED:  MARCAINE     COUNTS:  YES  TOURNIQUET:  * No tourniquets in log *  DICTATION: .Other Dictation: Dictation Number [id number]  PLAN OF CARE: Discharge to home after PACU  PATIENT DISPOSITION:  PACU - hemodynamically stable.

## 2023-03-13 NOTE — Anesthesia Preprocedure Evaluation (Addendum)
Anesthesia Evaluation  Patient identified by MRN, date of birth, ID band Patient awake    Reviewed: Allergy & Precautions, NPO status , Patient's Chart, lab work & pertinent test results  History of Anesthesia Complications Negative for: history of anesthetic complications  Airway Mallampati: II  TM Distance: >3 FB Neck ROM: Full    Dental  (+) Missing,    Pulmonary Current Smoker and Patient abstained from smoking.   Pulmonary exam normal        Cardiovascular hypertension, Pt. on medications Normal cardiovascular exam     Neuro/Psych Spinal cord stimulator in place  negative psych ROS   GI/Hepatic Neg liver ROS,GERD  Medicated and Controlled,,  Endo/Other  diabetes, Type 2, Oral Hypoglycemic Agents    Renal/GU negative Renal ROS  negative genitourinary   Musculoskeletal  (+) Arthritis ,  Left hip intractable bursitis; gluteal tendon tear   Abdominal   Peds  Hematology negative hematology ROS (+)   Anesthesia Other Findings Day of surgery medications reviewed with patient.  Reproductive/Obstetrics negative OB ROS                              Anesthesia Physical Anesthesia Plan  ASA: 2  Anesthesia Plan: General   Post-op Pain Management: Tylenol PO (pre-op)*   Induction: Intravenous  PONV Risk Score and Plan: 2 and Treatment may vary due to age or medical condition, Midazolam, Dexamethasone and Ondansetron  Airway Management Planned: Oral ETT  Additional Equipment: None  Intra-op Plan:   Post-operative Plan: Extubation in OR  Informed Consent: I have reviewed the patients History and Physical, chart, labs and discussed the procedure including the risks, benefits and alternatives for the proposed anesthesia with the patient or authorized representative who has indicated his/her understanding and acceptance.     Dental advisory given  Plan Discussed with:  CRNA  Anesthesia Plan Comments:         Anesthesia Quick Evaluation

## 2023-03-13 NOTE — Op Note (Unsigned)
NAME: Roberta Clark, Roberta Clark MEDICAL RECORD NO: [id number] ACCOUNT NO: [id number] DATE OF BIRTH: 01/20/1961 FACILITY: [facility] LOCATION: [facility] PHYSICIAN: Gus Rankin. Lyanne Kates, MD  Operative Report   DATE OF PROCEDURE: 03/13/2023  PREOPERATIVE DIAGNOSIS:  Left hip intractable bursitis with gluteal tendon tear.  POSTOPERATIVE DIAGNOSIS:  Left hip intractable bursitis with gluteal tendon tear.  PROCEDURE:  Left hip bursectomy and gluteal tendon repair.  SURGEON:  Gus Rankin. Joseeduardo Brix, MD  ASSISTANT:  Weston Brass, PA-C.  ANESTHESIA:  General.  ESTIMATED BLOOD LOSS:  10 mL.  DRAINS:  None.  COMPLICATIONS:  None.  CONDITION:  Stable to recovery.  BRIEF CLINICAL NOTE:  The patient is a 62 year old female who had a left total hip arthroplasty done several years ago.  She has had persistent recurrent lateral hip pain.  She has had injections, which helped temporarily.  She has had an MRI several  years ago, which did not show any abnormalities.  Pain persisted and she had an ultrasound done earlier this year, which did show gluteus medius tendon tear.  Given the intractable nature of her pain and the probable tendon tear she presents now for  bursectomy and tendon repair.  DESCRIPTION OF PROCEDURE:  After successful administration of general anesthetic, the patient was placed in the right lateral decubitus position with the left side up and held with a hip positioner.  Left lower extremity was isolated from her perineum  with plastic drapes and prepped and draped in the usual sterile fashion.  Lateral based incision was made centered at the tip of the greater trochanter.  Skin cut with a 10 blade through subcutaneous tissue to the fascia lata, which was incised in line  with the skin incision.  She did have a thickened bursa present, which was identified and excised.  I then inspected and palpated the gluteus medius tendon.  There is no evidence of a retracted full thickness tear.  The base of the  tendon had 1 very thin  area consistent with a partial tear.  I incised the tendon longitudinally.  Indeed, there were very few fibers adhering to in that small area.  This was consistent with a partial tear.  I then used a suture tape and sewed it through the edges of the  tear.  We then punched the guide punch into the greater trochanter for the Arthrex SwiveLock suture anchor.  We passed the sutures through the tendon passer in the SwiveLock and then buried the suture and SwiveLock into the bone.  This was a very stable  repair.  I oversewed it with the FiberTape with a free needle.  This was a very stable repair.  Wound was then copiously irrigated with saline solution and the fascia lata closed with a running 0 Stratafix suture.  Subcutaneous was closed with  interrupted 2-0 Vicryl and subcuticular running 4-0 Monocryl.  Incisions cleaned and dried and a bulky sterile dressing applied.  She was then awakened and transported to recovery in stable condition.   PUS D: 03/13/2023 4:59:31 pm T: 03/13/2023 5:11:00 pm  JOB: [id number]/ [id number]

## 2023-03-13 NOTE — Progress Notes (Signed)
Attempted wean off O2 to room air, dropped to 81%.  Encouraged deep breath and IS, Will monitor .

## 2023-03-14 ENCOUNTER — Encounter (HOSPITAL_COMMUNITY): Payer: Self-pay | Admitting: Orthopedic Surgery

## 2023-03-20 ENCOUNTER — Encounter: Payer: Medicare HMO | Admitting: Podiatry

## 2023-04-06 ENCOUNTER — Ambulatory Visit: Payer: Medicare HMO

## 2023-04-06 VITALS — BP 136/80 | HR 88

## 2023-04-06 DIAGNOSIS — Z0130 Encounter for examination of blood pressure without abnormal findings: Secondary | ICD-10-CM

## 2023-04-06 DIAGNOSIS — E119 Type 2 diabetes mellitus without complications: Secondary | ICD-10-CM

## 2023-04-06 NOTE — Progress Notes (Unsigned)
Patient reports that she has been taking bp medication consistently. Patient reports that he bp may be high due to pain on left hip and lower back.  Patient needs a new glucometer. Current glucometer device battery does not seem to work. Patient has tried [employer] representative, but it does not seem to help. Has had current device sine 2022.

## 2023-04-07 MED ORDER — ONETOUCH VERIO W/DEVICE KIT
PACK | 0 refills | Status: DC
Start: 2023-04-07 — End: 2023-06-20

## 2023-04-07 NOTE — Progress Notes (Unsigned)
No change to BP med and glucometer Rx sent

## 2023-04-18 ENCOUNTER — Encounter (HOSPITAL_COMMUNITY): Payer: Self-pay | Admitting: *Deleted

## 2023-04-18 ENCOUNTER — Telehealth: Payer: Self-pay | Admitting: Pharmacy Technician

## 2023-04-18 ENCOUNTER — Encounter: Payer: Self-pay | Admitting: Internal Medicine

## 2023-04-18 ENCOUNTER — Ambulatory Visit (HOSPITAL_COMMUNITY)
Admission: EM | Admit: 2023-04-18 | Discharge: 2023-04-18 | Disposition: A | Discharge status: Home / Self Care | Payer: 59 | Attending: Family Medicine | Admitting: Family Medicine

## 2023-04-18 ENCOUNTER — Other Ambulatory Visit: Payer: Self-pay

## 2023-04-18 DIAGNOSIS — Z1152 Encounter for screening for COVID-19: Secondary | ICD-10-CM | POA: Diagnosis not present

## 2023-04-18 DIAGNOSIS — J069 Acute upper respiratory infection, unspecified: Secondary | ICD-10-CM

## 2023-04-18 LAB — SARS CORONAVIRUS 2 (TAT 6-24 HRS): SARS Coronavirus 2: NEGATIVE

## 2023-04-18 MED ORDER — BENZONATATE 200 MG PO CAPS: 200.0000 mg | ORAL_CAPSULE | Freq: Three times a day (TID) | ORAL | 0 refills | Status: DC | PRN

## 2023-04-18 NOTE — ED Provider Notes (Signed)
[facility]    CSN: [id number] Arrival date & time: 04/18/23  1246      History   Chief Complaint Chief Complaint  Patient presents with   Cough   Nasal Congestion   Headache   Generalized Body Aches   Chills    HPI Roberta Clark is a 62 y.o. female.    Cough Associated symptoms: chills, headaches, myalgias and rhinorrhea   Associated symptoms: no fever   Headache Associated symptoms: congestion, cough, diarrhea, fatigue, myalgias and sinus pressure   Associated symptoms: no fever     Patient is here for 2 days of cough, congestion, body aches, headache, decreased appetite.  Mild nausea, no vomiting.  No diarrhea, but has had several Bms today.  No known fevers that she is aware of, but having chills/body aches.  No known sick contacts.       Past Medical History:  Diagnosis Date   Adenomatous colon polyp 05/13/2013   Anemia    Arthritis    Chronic pain    Diabetes mellitus without complication (HCC) 2011   Type II - diet controlled   Failed back surgical syndrome    has spinal cord stimulator   Family history of adverse reaction to anesthesia    mother - nausea   GERD (gastroesophageal reflux disease)    Hyperlipidemia 2011   Rash with statin:  not well controlled with Gemfibrozil   Hypertension 2011   Insomnia secondary to chronic pain    Low back pain with left-sided sciatica    Reportedly with lumbar spinal stenosis    Patient Active Problem List   Diagnosis Date Noted   Incontinence of feces 01/22/2023   Chronic left SI joint pain 01/22/2023   Lack of access to adequate food 12/04/2022   Syncope 12/04/2022   Carpal tunnel syndrome 10/11/2021   Paresthesia of hand 10/11/2021   Osteoporosis without current pathological fracture 06/15/2021   Pain of left hip joint 03/22/2021   S/P insertion of spinal cord stimulator 07/01/2019   Low back pain with left-sided sciatica    Long term use of drug 01/24/2019   Foot pain, bilateral  12/04/2018   Trochanteric bursitis of left hip 08/29/2018   Numbness of lower limb 12/11/2017   Inflammation of sacroiliac joint (HCC) 11/30/2017   Lumbosacral spondylosis without myelopathy 11/30/2017   Avascular necrosis of bone of hip (HCC) 08/24/2017   Avascular necrosis of femoral head, right (HCC) 02/22/2017   Avascular necrosis of femoral head, left (HCC) 10/24/2016   OA (osteoarthritis) of hip 10/24/2016   S/P lumbar laminectomy 05/27/2016   Type 2 diabetes mellitus without complication, without long-term current use of insulin (HCC) 11/03/2015   Multilevel degenerative disc disease 10/22/2014   Chronic back pain 05/27/2014   Lumbar radiculopathy, chronic 06/14/2013   Adenomatous colon polyp 05/13/2013   Vaginal discharge 08/22/2012   Primary hypertension 07/25/2012   Preventative health care 07/25/2012   Hot flash, menopausal 07/25/2012   Chronic low back pain 07/25/2012   Fibroids 09/02/2011   Tobacco dependence 09/02/2011   Mixed hyperlipidemia 08/15/2009    Past Surgical History:  Procedure Laterality Date   BACK SURGERY     BUNIONECTOMY Left 2023   COLONOSCOPY     EXCISION/RELEASE BURSA HIP Left 03/13/2023   Procedure: Left hip bursectomy; gluteal tendon repair;  Surgeon: Ollen Gross, MD;  Location: [facility];  Service: Orthopedics;  Laterality: Left;   LUMBAR LAMINECTOMY/DECOMPRESSION MICRODISCECTOMY Bilateral 05/27/2016   Procedure: Left Lumbar Four-Five Laminectomy and  Foraminotomy;  Surgeon: Tia Alert, MD;  Location: [facility] OR;  Service: Neurosurgery;  Laterality: Bilateral;   OPEN SURGICAL REPAIR OF GLUTEAL TENDON Left 03/13/2023   Procedure: OPEN REPAIR OF GLUTEAL TENDON;  Surgeon: Ollen Gross, MD;  Location: [facility];  Service: Orthopedics;  Laterality: Left;   SPINAL CORD STIMULATOR INSERTION N/A 07/01/2019   Procedure: Spinal cord stimulator via Thoracic Nine laminectomy;  Surgeon: Tia Alert, MD;  Location: [facility] OR;  Service: Neurosurgery;  Laterality:  N/A;  Spinal cord stimulator via Thoracic Nine laminectomy   TOTAL HIP ARTHROPLASTY Left 10/24/2016   Procedure: LEFT TOTAL HIP ARTHROPLASTY ANTERIOR APPROACH;  Surgeon: Ollen Gross, MD;  Location: [facility];  Service: Orthopedics;  Laterality: Left;   TOTAL HIP ARTHROPLASTY Right 02/22/2017   Procedure: RIGHT TOTAL HIP ARTHROPLASTY ANTERIOR APPROACH;  Surgeon: Ollen Gross, MD;  Location: [facility];  Service: Orthopedics;  Laterality: Right;   TUBAL LIGATION  1985    OB History     Gravida  2   Para  2   Term  2   Preterm  0   AB  0   Living  2      SAB  0   IAB  0   Ectopic  0   Multiple  0   Live Births               Home Medications    Prior to Admission medications   Medication Sig Start Date End Date Taking? Authorizing Provider  amitriptyline (ELAVIL) 25 MG tablet 1 tab by mouth at bedtime 03/09/23  Yes Julieanne Manson, MD  BIOTIN PO Take by mouth.   Yes [provider]  Calcium Citrate-Vitamin D 500-12.5 MG-MCG CHEW 1 chew by mouth twice daily 03/09/23  Yes Julieanne Manson, MD  diclofenac (VOLTAREN) 75 MG EC tablet Take 1 tablet (75 mg total) by mouth 2 (two) times daily. 11/04/22  Yes Redwine, Madison A, PA-C  gabapentin (NEURONTIN) 600 MG tablet 2 tabs by mouth three times daily 03/09/23  Yes Julieanne Manson, MD  glipiZIDE (GLUCOTROL XL) 5 MG 24 hr tablet Take 1 tablet (5 mg total) by mouth daily with breakfast. 03/09/23  Yes Julieanne Manson, MD  HYDROcodone-acetaminophen (NORCO/VICODIN) 5-325 MG tablet 1-2 po daily prn pain 04/04/23  Yes [provider]  ibuprofen (ADVIL) 800 MG tablet 1 tab by mouth daily with meal for severe pain Patient taking differently: as needed. 1 tab by mouth daily with meal for severe pain 02/03/23  Yes Julieanne Manson, MD  lisinopril (ZESTRIL) 10 MG tablet 1 tab by mouth daily with Lisinopril/HCTZ combination 03/09/23  Yes Julieanne Manson, MD  lisinopril-hydrochlorothiazide (ZESTORETIC)  20-12.5 MG tablet 1 tab by mouth daily with Lisinopril 10 mg tab 03/09/23  Yes Julieanne Manson, MD  Melatonin 3 MG SUBL Take 6 mg by mouth at bedtime.   Yes [provider]  Multiple Vitamin (MULTIVITAMIN ADULT PO) Take by mouth.   Yes [provider]  Omega-3 Fatty Acids (FISH OIL) 1000 MG CAPS Take 1,000 mg by mouth 2 (two) times daily.    Yes [provider]  omeprazole (PRILOSEC) 20 MG capsule TAKE ONE CAPSULE BY MOUTH EVERY DAY ON EMPTY stomach 03/09/23  Yes Julieanne Manson, MD  ondansetron (ZOFRAN) 4 MG tablet Take 1 tablet (4 mg total) by mouth every 8 (eight) hours as needed for nausea or vomiting. 01/24/22  Yes Regal, Kirstie Peri, DPM  potassium chloride (KLOR-CON M) 10 MEQ tablet TAKE ONE TABLET BY MOUTH  EVERY DAY 09/18/22  Yes Julieanne Manson, MD  psyllium (METAMUCIL SMOOTH TEXTURE) 58.6 % powder 1 packet in 8 oz water once daily 08/03/22  Yes Julieanne Manson, MD  rosuvastatin (CRESTOR) 20 MG tablet TAKE ONE TABLET BY MOUTH EVERY evening 03/09/23  Yes Julieanne Manson, MD  Blood Glucose Monitoring Suppl [facility] VERIO) w/Device KIT Check blood glucose twice daily before meals 04/07/23   Julieanne Manson, MD  glucose blood [facility] VERIO) test strip Use as instructed 03/09/23   Julieanne Manson, MD  Lancets [facility] DELICA PLUS [place]) MISC USE   TO CHECK GLUCOSE TWICE DAILY BEFORE MEAL(S) 03/09/23   Julieanne Manson, MD  lidocaine (LIDODERM) 5 % Place 1 patch onto the skin daily. Remove & Discard patch within 12 hours or as directed by MD Patient not taking: Reported on 12/06/2022 11/04/22   Redwine, Madison A, PA-C  methocarbamol (ROBAXIN) 500 MG tablet Take 1 tablet (500 mg total) by mouth every 6 (six) hours as needed for muscle spasms. 03/13/23   Cory Munch, PA-C  zoledronic acid (RECLAST) 5 MG/100ML SOLN injection 5 mg IV every 2 years as of January 19 2023 with Dr. Sharl Ma. 02/01/23   Julieanne Manson, MD    Family  History Family History  Problem Relation Age of Onset   Heart disease Mother 55       Hx of MI    Hypertension Mother    Diabetes Mother    Lupus Father    Diabetes Sister    Luiz Blare' disease Sister    GER disease Sister    Arthritis Daughter        Not clear if RA   Hypertension Daughter    Migraines Daughter    Luiz Blare' disease Brother    Diabetes Brother    Other Brother        COVID   Epilepsy Son        Started 63 months of age after bacterial meningitis   Breast cancer Neg Hx     Social History Social History   Tobacco Use   Smoking status: Some Days    Current packs/day: 0.25    Average packs/day: 0.2 packs/day for 46.3 years (11.6 ttl pk-yrs)    Types: Cigarettes    Start date: 12/17/1976   Smokeless tobacco: Never   Tobacco comments:    3 daily  Vaping Use   Vaping status: Never Used  Substance Use Topics   Alcohol use: No    Alcohol/week: 0.0 standard drinks of alcohol   Drug use: No     Allergies   Atorvastatin and Metformin and related   Review of Systems Review of Systems  Constitutional:  Positive for chills and fatigue. Negative for fever.  HENT:  Positive for congestion, rhinorrhea and sinus pressure.   Respiratory:  Positive for cough.   Cardiovascular: Negative.   Gastrointestinal:  Positive for diarrhea.  Musculoskeletal:  Positive for myalgias.  Skin: Negative.   Neurological:  Positive for headaches.  Psychiatric/Behavioral: Negative.       Physical Exam Triage Vital Signs ED Triage Vitals  Encounter Vitals Group     BP 04/18/23 1352 (!) 157/96     Systolic BP Percentile --      Diastolic BP Percentile --      Pulse Rate 04/18/23 1352 91     Resp 04/18/23 1352 20     Temp 04/18/23 1352 98.6 F (37 C)     Temp Source 04/18/23 1352 Oral     SpO2 04/18/23  1352 96 %     Weight --      Height --      Head Circumference --      Peak Flow --      Pain Score 04/18/23 1350 10     Pain Loc --      Pain Education --      Exclude  from Growth Chart --    No data found.  Updated Vital Signs BP (!) 157/96 (BP Location: Right Arm)   Pulse 91   Temp 98.6 F (37 C) (Oral)   Resp 20   SpO2 96%   Visual Acuity Right Eye Distance:   Left Eye Distance:   Bilateral Distance:    Right Eye Near:   Left Eye Near:    Bilateral Near:     Physical Exam Constitutional:      General: She is not in acute distress.    Appearance: She is well-developed. She is ill-appearing.  HENT:     Head: Normocephalic and atraumatic.  Cardiovascular:     Rate and Rhythm: Normal rate and regular rhythm.  Pulmonary:     Effort: Pulmonary effort is normal.     Breath sounds: Normal breath sounds.  Abdominal:     General: Bowel sounds are normal.     Palpations: Abdomen is soft.  Musculoskeletal:     Cervical back: Normal range of motion and neck supple.  Lymphadenopathy:     Cervical: No cervical adenopathy.  Neurological:     Mental Status: She is alert.      UC Treatments / Results  Labs (all labs ordered are listed, but only abnormal results are displayed) Labs Reviewed  SARS CORONAVIRUS 2 (TAT 6-24 HRS)    EKG   Radiology No results found.  Procedures Procedures (including critical care time)  Medications Ordered in UC Medications - No data to display  Initial Impression / Assessment and Plan / UC Course  I have reviewed the triage vital signs and the nursing notes.  Pertinent labs & imaging results that were available during my care of the patient were reviewed by me and considered in my medical decision making (see chart for details).    Patient was seen today for upper respiratory symptoms. Swabbed for covid.  If positive she would be a candidate for paxlovid, but would have to hold crestor while taking it.  Final Clinical Impressions(s) / UC Diagnoses   Final diagnoses:  Viral URI with cough  Encounter for screening for COVID-19     Discharge Instructions      You were seen today for upper  respiratory symptoms.  I have swabbed you for covid and this will be resulted tomorrow.  If positive you qualify for an anti-viral treatment.  In the meantime I recommend rest and fluids.  Please use the cough medication.   You may also use tylenol for headaches.     ED Prescriptions     Medication Sig Dispense Auth. Provider   benzonatate (TESSALON) 200 MG capsule Take 1 capsule (200 mg total) by mouth 3 (three) times daily as needed for cough. 21 capsule Jannifer Franklin, MD      PDMP not reviewed this encounter.   Jannifer Franklin, MD 04/18/23 1409

## 2023-04-18 NOTE — ED Triage Notes (Signed)
Pt states she has body aches, chills, cough, congestion chest pressure with coughing x 2 days. She states she has been taking nyquil and dayquil.

## 2023-04-18 NOTE — Discharge Instructions (Signed)
You were seen today for upper respiratory symptoms.  I have swabbed you for covid and this will be resulted tomorrow.  If positive you qualify for an anti-viral treatment.  In the meantime I recommend rest and fluids.  Please use the cough medication.   You may also use tylenol for headaches.

## 2023-04-18 NOTE — Telephone Encounter (Signed)
Auth Submission: NO AUTH NEEDED Site of care: Site of care: [facility] Payer: [employer] Medication & CPT/J Code(s) submitted: Reclast (Zolendronic acid) [id number] Route of submission (phone, fax, portal):  Phone # Fax # Auth type: Buy/Bill PB Units/visits requested: X1 DOSE Reference number:  Approval from: 04/18/23 to 08/15/23

## 2023-05-14 ENCOUNTER — Emergency Department (HOSPITAL_COMMUNITY)
Admission: EM | Admit: 2023-05-14 | Discharge: 2023-05-14 | Disposition: A | Discharge status: Home / Self Care | Payer: 59 | Attending: Emergency Medicine | Admitting: Emergency Medicine

## 2023-05-14 DIAGNOSIS — S161XXA Strain of muscle, fascia and tendon at neck level, initial encounter: Secondary | ICD-10-CM | POA: Diagnosis not present

## 2023-05-14 DIAGNOSIS — S199XXA Unspecified injury of neck, initial encounter: Secondary | ICD-10-CM | POA: Diagnosis present

## 2023-05-14 DIAGNOSIS — X58XXXA Exposure to other specified factors, initial encounter: Secondary | ICD-10-CM | POA: Insufficient documentation

## 2023-05-14 MED ORDER — LIDOCAINE 4 % EX PTCH: 1.0000 | MEDICATED_PATCH | CUTANEOUS | 0 refills | Status: AC

## 2023-05-14 MED ORDER — METHOCARBAMOL 500 MG PO TABS
500.0000 mg | ORAL_TABLET | Freq: Once | ORAL | Status: AC
  Administered 2023-05-14: 500 mg via ORAL
  Filled 2023-05-14: qty 1

## 2023-05-14 MED ORDER — OXYCODONE-ACETAMINOPHEN 5-325 MG PO TABS
1.0000 | ORAL_TABLET | Freq: Once | ORAL | Status: AC
  Administered 2023-05-14: 1 via ORAL
  Filled 2023-05-14: qty 1

## 2023-05-14 MED ORDER — KETOROLAC TROMETHAMINE 60 MG/2ML IM SOLN
30.0000 mg | Freq: Once | INTRAMUSCULAR | Status: AC
  Administered 2023-05-14: 30 mg via INTRAMUSCULAR
  Filled 2023-05-14: qty 2

## 2023-05-14 MED ORDER — METHOCARBAMOL 500 MG PO TABS: 500.0000 mg | ORAL_TABLET | Freq: Two times a day (BID) | ORAL | 0 refills | Status: DC

## 2023-05-14 MED ORDER — LIDOCAINE 5 % EX PTCH
1.0000 | MEDICATED_PATCH | CUTANEOUS | Status: DC
  Administered 2023-05-14: 1 via TRANSDERMAL
  Filled 2023-05-14: qty 1

## 2023-05-14 MED ORDER — ACETAMINOPHEN 500 MG PO TABS: 1000.0000 mg | ORAL_TABLET | Freq: Once | ORAL | Status: DC

## 2023-05-14 NOTE — ED Provider Triage Note (Signed)
Emergency Medicine Provider Triage Evaluation Note  Eudell Julian , a 62 y.o. female  was evaluated in triage.  Pt complains of right-sided neck pain since yesterday.  She denies any injury, she denies any chest pain, shortness of breath.  Patient reports that she tried heating pad for relief without getting any relief.  She denies any numbness, tingling  Review of Systems  Positive: Neck pain Negative: Chest pain, shortness of breath  Physical Exam  BP (!) 145/95   Pulse (!) 103   Temp 98.6 F (37 C) (Oral)   Resp 20   SpO2 99%  Gen:   Awake, no distress   Resp:  Normal effort  MSK:   Moves extremities without difficulty  Other:  Neck muscles on the right are quite tight, and tender to palpation, intact strength 5/5 of bilateral upper extremities  Medical Decision Making  Medically screening exam initiated at 10:02 AM.  Appropriate orders placed.  Tamberly Pomplun was informed that the remainder of the evaluation will be completed by another provider, this initial triage assessment does not replace that evaluation, and the importance of remaining in the ED until their evaluation is complete.  Workup initiated in triage    Olene Floss, PA-C 05/14/23 1002

## 2023-05-14 NOTE — Discharge Instructions (Addendum)
Here are the medications that I would like you to take for your neck pain.  You can take 1000 mg of Tylenol every 8 hours, 400 mg of ibuprofen every 6 hours.  I would like you to apply a lidocaine patch to the area each day.  You can take a Robaxin pill once in the morning and once in the evening as well.  These medications can sometimes make you sleepy, so do not drive if you are taking these medicines.  Your symptoms will likely persist for the next 5 to 7 days.  Follow-up with your primary care doctor.  Come back to the emergency department if you develop difficulty seeing, difficulty walking, or weakness or numbness in your hands.

## 2023-05-14 NOTE — ED Provider Notes (Signed)
[place] EMERGENCY DEPARTMENT AT [facility] Provider Note   CSN: [id number] Arrival date & time: 05/14/23  0865     History  Chief Complaint  Patient presents with   Neck Pain    Roberta Clark is a 62 y.o. female.  This is a 62 year old female here today for neck pain.  Patient says that she woke up on Friday with pain in her neck denies any trauma to the area.  No visual difficulty, difficulty with walking, or weakness in her arms or legs.  She has pain with turning her head to the right.   Neck Pain      Home Medications Prior to Admission medications   Medication Sig Start Date End Date Taking? Authorizing Provider  lidocaine (HM LIDOCAINE PATCH) 4 % Place 1 patch onto the skin daily for 7 days. 05/14/23 05/21/23 Yes Anders Simmonds T, DO  methocarbamol (ROBAXIN) 500 MG tablet Take 1 tablet (500 mg total) by mouth 2 (two) times daily. 05/14/23  Yes Arletha Pili, DO  amitriptyline (ELAVIL) 25 MG tablet 1 tab by mouth at bedtime 03/09/23   Julieanne Manson, MD  benzonatate (TESSALON) 200 MG capsule Take 1 capsule (200 mg total) by mouth 3 (three) times daily as needed for cough. 04/18/23   Jannifer Franklin, MD  BIOTIN PO Take by mouth.    [provider]  Blood Glucose Monitoring Suppl (ONETOUCH VERIO) w/Device KIT Check blood glucose twice daily before meals 04/07/23   Julieanne Manson, MD  Calcium Citrate-Vitamin D 500-12.5 MG-MCG CHEW 1 chew by mouth twice daily 03/09/23   Julieanne Manson, MD  diclofenac (VOLTAREN) 75 MG EC tablet Take 1 tablet (75 mg total) by mouth 2 (two) times daily. 11/04/22   Redwine, Madison A, PA-C  gabapentin (NEURONTIN) 600 MG tablet 2 tabs by mouth three times daily 03/09/23   Julieanne Manson, MD  glipiZIDE (GLUCOTROL XL) 5 MG 24 hr tablet Take 1 tablet (5 mg total) by mouth daily with breakfast. 03/09/23   Julieanne Manson, MD  glucose blood (ONETOUCH VERIO) test strip Use as instructed 03/09/23   Julieanne Manson, MD  HYDROcodone-acetaminophen (NORCO/VICODIN) 5-325 MG tablet 1-2 po daily prn pain 04/04/23   [provider]  ibuprofen (ADVIL) 800 MG tablet 1 tab by mouth daily with meal for severe pain Patient taking differently: as needed. 1 tab by mouth daily with meal for severe pain 02/03/23   Julieanne Manson, MD  Lancets [facility] DELICA PLUS [place]) MISC USE   TO CHECK GLUCOSE TWICE DAILY BEFORE MEAL(S) 03/09/23   Julieanne Manson, MD  lisinopril (ZESTRIL) 10 MG tablet 1 tab by mouth daily with Lisinopril/HCTZ combination 03/09/23   Julieanne Manson, MD  lisinopril-hydrochlorothiazide (ZESTORETIC) 20-12.5 MG tablet 1 tab by mouth daily with Lisinopril 10 mg tab 03/09/23   Julieanne Manson, MD  Melatonin 3 MG SUBL Take 6 mg by mouth at bedtime.    [provider]  Multiple Vitamin (MULTIVITAMIN ADULT PO) Take by mouth.    [provider]  Omega-3 Fatty Acids (FISH OIL) 1000 MG CAPS Take 1,000 mg by mouth 2 (two) times daily.     [provider]  omeprazole (PRILOSEC) 20 MG capsule TAKE ONE CAPSULE BY MOUTH EVERY DAY ON EMPTY stomach 03/09/23   Julieanne Manson, MD  ondansetron (ZOFRAN) 4 MG tablet Take 1 tablet (4 mg total) by mouth every 8 (eight) hours as needed for nausea or vomiting. 01/24/22   Regal, Kirstie Peri, DPM  potassium chloride (KLOR-CON  M) 10 MEQ tablet TAKE ONE TABLET BY MOUTH EVERY DAY 09/18/22   Julieanne Manson, MD  psyllium (METAMUCIL SMOOTH TEXTURE) 58.6 % powder 1 packet in 8 oz water once daily 08/03/22   Julieanne Manson, MD  rosuvastatin (CRESTOR) 20 MG tablet TAKE ONE TABLET BY MOUTH EVERY evening 03/09/23   Julieanne Manson, MD  zoledronic acid (RECLAST) 5 MG/100ML SOLN injection 5 mg IV every 2 years as of January 19 2023 with Dr. Sharl Ma. 02/01/23   Julieanne Manson, MD      Allergies    Atorvastatin and Metformin and related    Review of Systems   Review of Systems  Musculoskeletal:  Positive for neck pain.     Physical Exam Updated Vital Signs BP (!) 145/95   Pulse (!) 103   Temp 98.6 F (37 C) (Oral)   Resp 20   SpO2 99%  Physical Exam Vitals reviewed.  Cardiovascular:     Rate and Rhythm: Normal rate.     Comments: No carotid bruit. Musculoskeletal:     Comments: Tenderness to palpation in the right trapezius, and right paraspinal muscles.  There is no midline spinous process tenderness.  Neurological:     Mental Status: She is alert.     Cranial Nerves: No cranial nerve deficit.     Sensory: No sensory deficit.     Motor: No weakness.     Gait: Gait normal.     ED Results / Procedures / Treatments   Labs (all labs ordered are listed, but only abnormal results are displayed) Labs Reviewed - No data to display  EKG None  Radiology No results found.  Procedures Procedures    Medications Ordered in ED Medications  lidocaine (LIDODERM) 5 % 1 patch (1 patch Transdermal Patch Applied 05/14/23 1157)  methocarbamol (ROBAXIN) tablet 500 mg (500 mg Oral Given 05/14/23 1003)  oxyCODONE-acetaminophen (PERCOCET/ROXICET) 5-325 MG per tablet 1 tablet (1 tablet Oral Given 05/14/23 1003)  ketorolac (TORADOL) injection 30 mg (30 mg Intramuscular Given 05/14/23 1156)    ED Course/ Medical Decision Making/ A&P                                 Medical Decision Making This is 62 year old female here today with neck pain.  Differential diagnoses include musculoskeletal back pain, less likely vascular injury, less likely dissection, less likely cervical spine injury.  Plan-based on the patient's exam and history, do not believe she requires any imaging.  Will symptomatically treat the patient.  Reassessment-patient responded well to Robaxin, and a Percocet that she received at triage.  I ordered the patient some Toradol.  Will prescribe her some analgesia to go home with.  She will follow-up with her primary care doctor.  Risk Prescription drug management.           Final  Clinical Impression(s) / ED Diagnoses Final diagnoses:  Acute strain of neck muscle, initial encounter    Rx / DC Orders ED Discharge Orders          Ordered    methocarbamol (ROBAXIN) 500 MG tablet  2 times daily        05/14/23 1215    lidocaine (HM LIDOCAINE PATCH) 4 %  Every 24 hours        05/14/23 1215              Anders Simmonds T, DO 05/14/23 1215

## 2023-05-14 NOTE — ED Triage Notes (Signed)
Patient BIB [facility] from home for right neck pain radiating to right shoulder, no deficits. Pain started last night with no relief with heating pad. A&Ox4, VSS, no other complaints, only hx of hyperlipidemia.

## 2023-06-06 ENCOUNTER — Telehealth: Payer: Self-pay | Admitting: Psychology

## 2023-06-13 ENCOUNTER — Encounter: Payer: Self-pay | Admitting: Internal Medicine

## 2023-06-16 ENCOUNTER — Encounter: Payer: Self-pay | Admitting: Internal Medicine

## 2023-06-20 ENCOUNTER — Other Ambulatory Visit: Payer: Self-pay

## 2023-06-20 DIAGNOSIS — E119 Type 2 diabetes mellitus without complications: Secondary | ICD-10-CM

## 2023-06-20 MED ORDER — ONETOUCH VERIO W/DEVICE KIT
PACK | 0 refills | Status: DC
Start: 2023-06-20 — End: 2024-05-01

## 2023-06-20 NOTE — Telephone Encounter (Signed)
Spoke with patient and she reports that she would call to get 90 days supply set up.

## 2023-06-20 NOTE — Telephone Encounter (Signed)
Sent in order for Glucometer device. Pharmacy cannot make 90 days fills until physic meds are filled for 90 days.

## 2023-06-23 ENCOUNTER — Other Ambulatory Visit (INDEPENDENT_AMBULATORY_CARE_PROVIDER_SITE_OTHER): Payer: 59

## 2023-06-23 DIAGNOSIS — E782 Mixed hyperlipidemia: Secondary | ICD-10-CM

## 2023-06-24 LAB — LIPID PANEL W/O CHOL/HDL RATIO
Cholesterol, Total: 170 mg/dL (ref 100–199)
HDL: 57 mg/dL (ref >39)
LDL Chol Calc (NIH): 102 mg/dL — ABNORMAL HIGH (ref 0–99)
Triglycerides: 54 mg/dL (ref 0–149)
VLDL Cholesterol Cal: 11 mg/dL (ref 5–40)

## 2023-06-29 ENCOUNTER — Ambulatory Visit (INDEPENDENT_AMBULATORY_CARE_PROVIDER_SITE_OTHER): Payer: 59 | Admitting: Internal Medicine

## 2023-06-29 ENCOUNTER — Encounter: Payer: Self-pay | Admitting: Internal Medicine

## 2023-06-29 VITALS — BP 150/90 | HR 92 | Resp 16 | Ht 63.0 in | Wt 108.0 lb

## 2023-06-29 DIAGNOSIS — E782 Mixed hyperlipidemia: Secondary | ICD-10-CM

## 2023-06-29 DIAGNOSIS — E119 Type 2 diabetes mellitus without complications: Secondary | ICD-10-CM | POA: Diagnosis not present

## 2023-06-29 DIAGNOSIS — I10 Essential (primary) hypertension: Secondary | ICD-10-CM | POA: Diagnosis not present

## 2023-06-29 DIAGNOSIS — Z23 Encounter for immunization: Secondary | ICD-10-CM

## 2023-06-29 MED ORDER — ROSUVASTATIN CALCIUM 40 MG PO TABS: ORAL_TABLET | ORAL | 11 refills | Status: DC

## 2023-06-29 MED ORDER — CARVEDILOL 3.125 MG PO TABS: 3.1250 mg | ORAL_TABLET | Freq: Two times a day (BID) | ORAL | 11 refills | Status: DC

## 2023-06-29 MED ORDER — EMPAGLIFLOZIN 10 MG PO TABS: 10.0000 mg | ORAL_TABLET | Freq: Every day | ORAL | 11 refills | Status: DC

## 2023-06-29 NOTE — Progress Notes (Signed)
Subjective:    Patient ID: Roberta Clark, female   DOB: 08-03-1961, 62 y.o.   MRN: [id number]   HPI   Hypercholesterolemia:  LDL still a bit high, but in general cholesterol panel much improved.  Not missing Rosuvastatin 20 mg Lipid Panel     Component Value Date/Time   CHOL 170 06/23/2023 1043   TRIG 54 06/23/2023 1043   HDL 57 06/23/2023 1043   CHOLHDL 4.4 07/25/2012 1413   VLDL 19 07/25/2012 1413   LDLCALC 102 (H) 06/23/2023 1043   LDLDIRECT 111 (H) 06/10/2009 1933   LABVLDL 11 06/23/2023 1043   .2.  DM:  has been well controlled, but would like to see about getting off glucotrol.  She is interested in trying [place],  [place] check tomorrow with Dr. Dione Booze.    3.  Hypertension:  Remains high most of the time on Lisinopril and HCTZ   Current Meds  Medication Sig   amitriptyline (ELAVIL) 25 MG tablet 1 tab by mouth at bedtime   BIOTIN PO Take by mouth.   Blood Glucose Monitoring Suppl (ONETOUCH VERIO) w/Device KIT Check blood glucose twice daily before meals   Calcium Citrate-Vitamin D 500-12.5 MG-MCG CHEW 1 chew by mouth twice daily   diclofenac (VOLTAREN) 75 MG EC tablet Take 1 tablet (75 mg total) by mouth 2 (two) times daily.   gabapentin (NEURONTIN) 600 MG tablet 2 tabs by mouth three times daily   glipiZIDE (GLUCOTROL XL) 5 MG 24 hr tablet Take 1 tablet (5 mg total) by mouth daily with breakfast.   glucose blood (ONETOUCH VERIO) test strip Use as instructed   ibuprofen (ADVIL) 800 MG tablet 1 tab by mouth daily with meal for severe pain (Patient taking differently: as needed. 1 tab by mouth daily with meal for severe pain)   Lancets (ONETOUCH DELICA PLUS LANCET33G) MISC USE   TO CHECK GLUCOSE TWICE DAILY BEFORE MEAL(S)   lisinopril (ZESTRIL) 10 MG tablet 1 tab by mouth daily with Lisinopril/HCTZ combination   lisinopril-hydrochlorothiazide (ZESTORETIC) 20-12.5 MG tablet 1 tab by mouth daily with Lisinopril 10 mg tab   Melatonin 3 MG SUBL Take 6 mg by mouth at bedtime.    methocarbamol (ROBAXIN) 500 MG tablet Take 1 tablet (500 mg total) by mouth 2 (two) times daily.   Multiple Vitamin (MULTIVITAMIN ADULT PO) Take by mouth.   Omega-3 Fatty Acids (FISH OIL) 1000 MG CAPS Take 1,000 mg by mouth 2 (two) times daily.    omeprazole (PRILOSEC) 20 MG capsule TAKE ONE CAPSULE BY MOUTH EVERY DAY ON EMPTY stomach   potassium chloride (KLOR-CON M) 10 MEQ tablet TAKE ONE TABLET BY MOUTH EVERY DAY   psyllium (METAMUCIL SMOOTH TEXTURE) 58.6 % powder 1 packet in 8 oz water once daily   rosuvastatin (CRESTOR) 20 MG tablet TAKE ONE TABLET BY MOUTH EVERY evening   zoledronic acid (RECLAST) 5 MG/100ML SOLN injection 5 mg IV every 2 years as of January 19 2023 with Dr. Sharl Ma.   Allergies  Allergen Reactions   Atorvastatin Rash   Metformin And Related Nausea And Vomiting     Review of Systems    Objective:   BP (!) 150/90 (BP Location: Left Arm, Patient Position: Sitting, Cuff Size: Normal)   Pulse 92   Resp 16   Ht 5\' 3"  (1.6 m)   Wt 108 lb (49 kg)   BMI 19.13 kg/m   Physical Exam Eyes:     Extraocular Movements: Extraocular movements intact.  Pupils: Pupils are equal, round, and reactive to light.  Cardiovascular:     Rate and Rhythm: Normal rate and regular rhythm.     Pulses: Normal pulses.          Dorsalis pedis pulses are 2+ on the right side and 2+ on the left side.       Posterior tibial pulses are 2+ on the right side and 2+ on the left side.     Heart sounds: No murmur heard.    No friction rub.  Pulmonary:     Effort: Pulmonary effort is normal.     Breath sounds: Normal breath sounds.  Musculoskeletal:     Cervical back: Neck supple.  Feet:     Right foot:     Protective Sensation: 10 sites tested.  10 sites sensed.     Skin integrity: Skin integrity normal.     Toenail Condition: Right toenails are abnormally thick. Fungal disease present.    Left foot:     Protective Sensation: 10 sites tested.  10 sites sensed.     Skin integrity: Skin  integrity normal.     Toenail Condition: Left toenails are abnormally thick. Fungal disease present.    Comments: Bruising on dorsal left foot where bone shaved down recently during bunion surgery     Assessment & Plan   Hypercholesterolemia: much improved, but still not at goal with LDL.  She has been close in recent years--continue to be consistent with Rosuvastatin and work on lifestyle changes.    2.  DM:  Start Jardiance 10 mg daily.    3.  Hypertension:  Add Carvedilol 3.125 mg twice daily.  4.  HM:  Spikevax/COVID

## 2023-07-17 ENCOUNTER — Other Ambulatory Visit: Payer: 59

## 2023-07-17 VITALS — BP 136/86 | HR 72

## 2023-07-17 DIAGNOSIS — Z0130 Encounter for examination of blood pressure without abnormal findings: Secondary | ICD-10-CM | POA: Diagnosis not present

## 2023-07-17 NOTE — Progress Notes (Signed)
Patient reported that she is taking bp medication consistently. ?

## 2023-08-09 ENCOUNTER — Encounter: Payer: 59 | Admitting: Internal Medicine

## 2023-08-15 LAB — HM DIABETES EYE EXAM

## 2023-08-16 ENCOUNTER — Encounter: Payer: Self-pay | Admitting: Internal Medicine

## 2023-08-22 ENCOUNTER — Ambulatory Visit: Payer: 59 | Admitting: Internal Medicine

## 2023-08-22 ENCOUNTER — Encounter: Payer: Self-pay | Admitting: Internal Medicine

## 2023-08-22 VITALS — BP 138/88 | HR 80 | Resp 16 | Ht 63.0 in | Wt 101.5 lb

## 2023-08-22 DIAGNOSIS — E119 Type 2 diabetes mellitus without complications: Secondary | ICD-10-CM

## 2023-08-22 DIAGNOSIS — I10 Essential (primary) hypertension: Secondary | ICD-10-CM

## 2023-08-22 DIAGNOSIS — Z79899 Other long term (current) drug therapy: Secondary | ICD-10-CM

## 2023-08-22 DIAGNOSIS — E782 Mixed hyperlipidemia: Secondary | ICD-10-CM

## 2023-08-22 DIAGNOSIS — Z0000 Encounter for general adult medical examination without abnormal findings: Secondary | ICD-10-CM

## 2023-08-22 MED ORDER — NICOTINE 14 MG/24HR TD PT24: 14.0000 mg | MEDICATED_PATCH | Freq: Every day | TRANSDERMAL | 0 refills | Status: DC

## 2023-08-22 MED ORDER — NICOTINE 7 MG/24HR TD PT24: 7.0000 mg | MEDICATED_PATCH | Freq: Every day | TRANSDERMAL | 0 refills | Status: DC

## 2023-08-22 NOTE — Progress Notes (Unsigned)
 Subjective:    Patient ID: Roberta Clark Agent, female   DOB: [birth year], 63 y.o.   MRN: [id number]   HPI  CPE without pap  1.  Pap:  Last 2023 and normal.  Always normal.    2.  Mammogram:  Last 01/2023 and normal.  No family history of breast cancer.    3.  Osteoprevention:  Followed by Dr. Faythe, endocrine.  She has osteoporosis diagnosed in 2021.  Her bone density in May of 2024 had improved to osteopenic range at -2.2.  She has been changed to every 2 years with Reclast .  She is performing weight bearing physical activity 2-3 times weekly.    4.  [place] Cards/FIT:  negative FIT 08/2023.    5.  Colonoscopy:  2019 with Dr. Dianna.  Only small internal hemorrhoids.  Due 06/29/2028.  6.  Immunizations:  Up to Date Immunization History  Administered Date(s) Administered   Influenza Inj Mdck Quad Pf 07/12/2016, 07/10/2017, 06/12/2019, 07/12/2021, 08/03/2022   Influenza Inj Mdck Quad With Preservative 06/17/2020   Influenza,inj,Quad PF,6+ Mos 06/06/2018   Influenza,inj,quad, With Preservative 07/15/2017, 06/06/2018   Influenza-Unspecified 05/05/2023   Moderna Covid-19 Fall Seasonal Vaccine 90yrs & older 06/29/2023   Moderna Sars-Covid-2 Vaccination 10/14/2019, 11/18/2019   Pfizer Covid-19 Vaccine Bivalent Booster 30yrs & up 07/12/2021   Pneumococcal Polysaccharide-23 02/02/2016   Td 03/09/2023   Tdap 08/16/2011   Zoster Recombinant(Shingrix ) 01/13/2022, 03/09/2023     7.  Glucose/Cholesterol:  A1C in July at 6.9%.  Cholesterol In November improved, but LDL still a bit high.  She has not tolerated other meds.  Currently on max of Rosuvastatin .   Lipid Panel     Component Value Date/Time   CHOL 170 06/23/2023 1043   TRIG 54 06/23/2023 1043   HDL 57 06/23/2023 1043   CHOLHDL 4.4 07/25/2012 1413   VLDL 19 07/25/2012 1413   LDLCALC 102 (H) 06/23/2023 1043   LDLDIRECT 111 (H) 06/10/2009 1933   LABVLDL 11 06/23/2023 1043     Current Meds  Medication Sig   amitriptyline   (ELAVIL ) 25 MG tablet 1 tab by mouth at bedtime   BIOTIN PO Take by mouth.   Calcium  Citrate-Vitamin D  500-12.5 MG-MCG CHEW 1 chew by mouth twice daily   carvedilol  (COREG ) 3.125 MG tablet Take 1 tablet (3.125 mg total) by mouth 2 (two) times daily with a meal.   diclofenac  (VOLTAREN ) 75 MG EC tablet Take 1 tablet (75 mg total) by mouth 2 (two) times daily.   empagliflozin  (JARDIANCE ) 10 MG TABS tablet Take 1 tablet (10 mg total) by mouth daily before breakfast.   gabapentin  (NEURONTIN ) 600 MG tablet 2 tabs by mouth three times daily   glipiZIDE  (GLUCOTROL  XL) 5 MG 24 hr tablet Take 1 tablet (5 mg total) by mouth daily with breakfast.   ibuprofen  (ADVIL ) 800 MG tablet 1 tab by mouth daily with meal for severe pain (Patient taking differently: as needed. 1 tab by mouth daily with meal for severe pain)   lisinopril  (ZESTRIL ) 10 MG tablet 1 tab by mouth daily with Lisinopril /HCTZ combination   lisinopril -hydrochlorothiazide  (ZESTORETIC ) 20-12.5 MG tablet 1 tab by mouth daily with Lisinopril  10 mg tab   Melatonin 3 MG SUBL Take 6 mg by mouth at bedtime.   methocarbamol  (ROBAXIN ) 500 MG tablet Take 1 tablet (500 mg total) by mouth 2 (two) times daily.   Multiple Vitamin (MULTIVITAMIN ADULT PO) Take by mouth.   Omega-3 Fatty Acids (FISH OIL ) 1000 MG CAPS Take  1,000 mg by mouth 2 (two) times daily.    omeprazole  (PRILOSEC) 20 MG capsule TAKE ONE CAPSULE BY MOUTH EVERY DAY ON EMPTY stomach   potassium chloride  (KLOR-CON  M) 10 MEQ tablet TAKE ONE TABLET BY MOUTH EVERY DAY   psyllium (METAMUCIL SMOOTH TEXTURE) 58.6 % powder 1 packet in 8 oz water once daily   rosuvastatin  (CRESTOR ) 40 MG tablet TAKE ONE TABLET BY MOUTH EVERY evening with food.   zoledronic  acid (RECLAST ) 5 MG/100ML SOLN injection 5 mg IV every 2 years as of January 19 2023 with Dr. Faythe.   Allergies  Allergen Reactions   Atorvastatin  Rash   Metformin And Related Nausea And Vomiting   Past Medical History:  Diagnosis Date   Adenomatous  colon polyp 05/13/2013   Anemia    Arthritis    Chronic pain    Diabetes mellitus without complication (HCC) 2011   Type II - diet controlled   Failed back surgical syndrome    has spinal cord stimulator   Family history of adverse reaction to anesthesia    mother - nausea   GERD (gastroesophageal reflux disease)    Hyperlipidemia 2011   Rash with statin:  not well controlled with Gemfibrozil    Hypertension 2011   Insomnia secondary to chronic pain    Low back pain with left-sided sciatica    Reportedly with lumbar spinal stenosis   Past Surgical History:  Procedure Laterality Date   BACK SURGERY     BUNIONECTOMY Left 2023   COLONOSCOPY     EXCISION/RELEASE BURSA HIP Left 03/13/2023   Procedure: Left hip bursectomy; gluteal tendon repair;  Surgeon: Melodi Lerner, MD;  Location: [facility];  Service: Orthopedics;  Laterality: Left;   LUMBAR LAMINECTOMY/DECOMPRESSION MICRODISCECTOMY Bilateral 05/27/2016   Procedure: Left Lumbar Four-Five Laminectomy and Foraminotomy;  Surgeon: Alm GORMAN Molt, MD;  Location: [facility] OR;  Service: Neurosurgery;  Laterality: Bilateral;   OPEN SURGICAL REPAIR OF GLUTEAL TENDON Left 03/13/2023   Procedure: OPEN REPAIR OF GLUTEAL TENDON;  Surgeon: Melodi Lerner, MD;  Location: [facility];  Service: Orthopedics;  Laterality: Left;   SPINAL CORD STIMULATOR INSERTION N/A 07/01/2019   Procedure: Spinal cord stimulator via Thoracic Nine laminectomy;  Surgeon: Molt Alm GORMAN, MD;  Location[facility] OR;  Service: Neurosurgery;  Laterality: N/A;  Spinal cord stimulator via Thoracic Nine laminectomy   TOTAL HIP ARTHROPLASTY Left 10/24/2016   Procedure: LEFT TOTAL HIP ARTHROPLASTY ANTERIOR APPROACH;  Surgeon: Lerner Melodi, MD;  Location: [facility];  Service: Orthopedics;  Laterality: Left;   TOTAL HIP ARTHROPLASTY Right 02/22/2017   Procedure: RIGHT TOTAL HIP ARTHROPLASTY ANTERIOR APPROACH;  Surgeon: Melodi Lerner, MD;  Location: [facility];  Service: Orthopedics;  Laterality: Right;    TUBAL LIGATION  1985   Family History  Problem Relation Age of Onset   Heart disease Mother 66       Hx of MI    Hypertension Mother    Diabetes Mother    Lupus Father    Diabetes Sister    Yvone' disease Sister    GER disease Sister    Arthritis Daughter        Not clear if RA   Hypertension Daughter    Migraines Daughter    Yvone' disease Brother    Diabetes Brother    Other Brother        COVID   Epilepsy Son        Started 51 months of age after bacterial meningitis   Breast cancer Neg Hx  Social History   Socioeconomic History   Marital status: Single    Spouse name: Aida   Number of children: 2   Years of education: 14   Highest education level: Associate degree: occupational, scientist, product/process development, or vocational program  Occupational History   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   Children live in town and are supportive.   Originally from [place].   Went to [employer] [place].   Social Drivers of Corporate Investment Banker Strain: Low Risk  (08/22/2023)   Overall Financial Resource Strain (CARDIA)    Difficulty of Paying Living Expenses: Not hard at all  Food Insecurity: No Food Insecurity (08/22/2023)   Hunger Vital Sign    Worried About Running Out of Food in the Last Year: Never true    Ran Out of Food in the Last Year: Never true   Transportation Needs: No Transportation Needs (08/22/2023)   PRAPARE - Administrator, Civil Service (Medical): No    Lack of Transportation (Non-Medical): No  Physical Activity: Not on file  Stress: Not on file  Social Connections: Unknown (06/12/2019)   Social Connection and Isolation Panel [NHANES]    Frequency of Communication with Friends and Family: Not on file    Frequency of Social Gatherings with Friends and Family: Not on file    Attends Religious Services: Not on file    Active Member of Clubs or Organizations: Not on file    Attends Banker Meetings: Not on file    Marital Status: Never married  Intimate Partner Violence: Not At Risk (08/22/2023)   Humiliation, Afraid, Rape, and Kick questionnaire    Fear of Current or Ex-Partner: No    Emotionally Abused: No    Physically Abused: No    Sexually Abused: No      Review of Systems  HENT:  Positive for dental problem (Has dental coverage.).   Eyes:  Negative for visual disturbance (eyes checked last week.  Dr. Octavia.  No diabetic changes,).  Respiratory:  Negative for shortness of breath.   Cardiovascular:  Negative for chest pain, palpitations and leg swelling.  Gastrointestinal:  Negative for abdominal pain and blood in stool (No melena).  Genitourinary:  Negative for vaginal bleeding.  Neurological:  Negative for weakness and numbness.      Objective:   BP 138/88 (BP Location: Left Arm, Patient Position: Sitting, Cuff Size: Normal)   Pulse 80   Resp 16   Ht 5' 3 (1.6 m)   Wt 101 lb 8 oz (46 kg)   BMI 17.98 kg/m   Physical Exam Pelvoc mpr,a; No sensation on right foot, left foot on plantar save for great toe and nothing on dorsum  Assessment & Plan

## 2023-08-22 NOTE — Patient Instructions (Signed)
[facility] Address: [address], [place], [place] [address] Phone: [phone] Ask to speak with the man or woman in charge of [facility]--I would recommend you look into Part A and D. 

## 2023-08-23 LAB — COMPREHENSIVE METABOLIC PANEL
ALT: 21 [IU]/L (ref 0–32)
AST: 17 [IU]/L (ref 0–40)
Albumin: 4.7 g/dL (ref 3.9–4.9)
Alkaline Phosphatase: 74 [IU]/L (ref 44–121)
BUN/Creatinine Ratio: 16 (ref 12–28)
BUN: 12 mg/dL (ref 8–27)
Bilirubin Total: 0.3 mg/dL (ref 0.0–1.2)
CO2: 24 mmol/L (ref 20–29)
Calcium: 9.5 mg/dL (ref 8.7–10.3)
Chloride: 106 mmol/L (ref 96–106)
Creatinine, Ser: 0.73 mg/dL (ref 0.57–1.00)
Globulin, Total: 2.6 g/dL (ref 1.5–4.5)
Glucose: 107 mg/dL — ABNORMAL HIGH (ref 70–99)
Potassium: 3.6 mmol/L (ref 3.5–5.2)
Sodium: 146 mmol/L — ABNORMAL HIGH (ref 134–144)
Total Protein: 7.3 g/dL (ref 6.0–8.5)
eGFR: 92 mL/min/{1.73_m2} (ref >59)

## 2023-08-23 LAB — CBC WITH DIFFERENTIAL/PLATELET
Basophils Absolute: 0.1 10*3/uL (ref 0.0–0.2)
Basos: 1 %
EOS (ABSOLUTE): 0.1 10*3/uL (ref 0.0–0.4)
Eos: 2 %
Hematocrit: 43.2 % (ref 34.0–46.6)
Hemoglobin: 13.5 g/dL (ref 11.1–15.9)
Immature Grans (Abs): 0 10*3/uL (ref 0.0–0.1)
Immature Granulocytes: 0 %
Lymphocytes Absolute: 3.8 10*3/uL — ABNORMAL HIGH (ref 0.7–3.1)
Lymphs: 48 %
MCH: 28.2 pg (ref 26.6–33.0)
MCHC: 31.3 g/dL — ABNORMAL LOW (ref 31.5–35.7)
MCV: 90 fL (ref 79–97)
Monocytes Absolute: 0.4 10*3/uL (ref 0.1–0.9)
Monocytes: 5 %
Neutrophils Absolute: 3.4 10*3/uL (ref 1.4–7.0)
Neutrophils: 44 %
Platelets: 345 10*3/uL (ref 150–450)
RBC: 4.79 x10E6/uL (ref 3.77–5.28)
RDW: 12 % (ref 11.7–15.4)
WBC: 7.8 10*3/uL (ref 3.4–10.8)

## 2023-08-23 LAB — MICROALBUMIN / CREATININE URINE RATIO
Creatinine, Urine: 146.7 mg/dL
Microalb/Creat Ratio: 4 mg/g{creat} (ref 0–29)
Microalbumin, Urine: 5.4 ug/mL

## 2023-08-23 LAB — HEMOGLOBIN A1C
Est. average glucose Bld gHb Est-mCnc: 163 mg/dL
Hgb A1c MFr Bld: 7.3 % — ABNORMAL HIGH (ref 4.8–5.6)

## 2023-08-23 LAB — LIPID PANEL W/O CHOL/HDL RATIO
Cholesterol, Total: 234 mg/dL — ABNORMAL HIGH (ref 100–199)
HDL: 66 mg/dL (ref >39)
LDL Chol Calc (NIH): 149 mg/dL — ABNORMAL HIGH (ref 0–99)
Triglycerides: 107 mg/dL (ref 0–149)
VLDL Cholesterol Cal: 19 mg/dL (ref 5–40)

## 2023-09-08 ENCOUNTER — Other Ambulatory Visit: Payer: Self-pay | Admitting: Internal Medicine

## 2023-09-08 DIAGNOSIS — F419 Anxiety disorder, unspecified: Secondary | ICD-10-CM

## 2023-09-08 DIAGNOSIS — F32A Depression, unspecified: Secondary | ICD-10-CM

## 2023-09-11 ENCOUNTER — Telehealth: Payer: Self-pay

## 2023-09-11 NOTE — Telephone Encounter (Signed)
Patient did not request refill on Fluoxetine 40mg 

## 2023-09-11 NOTE — Telephone Encounter (Signed)
Patient is requesting refill on Methocarbamol 500mg  tablets. Patient does not remember provider that originally prescribed medication.

## 2023-09-14 ENCOUNTER — Other Ambulatory Visit: Payer: Self-pay

## 2023-09-14 MED ORDER — METHOCARBAMOL 500 MG PO TABS: 500.0000 mg | ORAL_TABLET | Freq: Two times a day (BID) | ORAL | 0 refills | Status: DC

## 2023-09-14 NOTE — Telephone Encounter (Signed)
Rx has been refilled. Patient has been notified.

## 2023-10-02 ENCOUNTER — Other Ambulatory Visit: Payer: Self-pay | Admitting: Internal Medicine

## 2023-10-02 DIAGNOSIS — F419 Anxiety disorder, unspecified: Secondary | ICD-10-CM

## 2023-10-02 DIAGNOSIS — F32A Depression, unspecified: Secondary | ICD-10-CM

## 2023-10-20 ENCOUNTER — Other Ambulatory Visit: Payer: 59

## 2023-10-26 ENCOUNTER — Ambulatory Visit: Payer: 59 | Admitting: Internal Medicine

## 2023-11-14 DIAGNOSIS — Z681 Body mass index (BMI) 19 or less, adult: Secondary | ICD-10-CM | POA: Diagnosis not present

## 2023-11-14 DIAGNOSIS — M5412 Radiculopathy, cervical region: Secondary | ICD-10-CM | POA: Diagnosis not present

## 2023-11-16 DIAGNOSIS — M7062 Trochanteric bursitis, left hip: Secondary | ICD-10-CM | POA: Diagnosis not present

## 2023-12-05 DIAGNOSIS — M5412 Radiculopathy, cervical region: Secondary | ICD-10-CM | POA: Diagnosis not present

## 2023-12-20 DIAGNOSIS — M48062 Spinal stenosis, lumbar region with neurogenic claudication: Secondary | ICD-10-CM | POA: Diagnosis not present

## 2023-12-21 DIAGNOSIS — M810 Age-related osteoporosis without current pathological fracture: Secondary | ICD-10-CM | POA: Diagnosis not present

## 2024-01-01 ENCOUNTER — Other Ambulatory Visit: Payer: Self-pay | Admitting: Internal Medicine

## 2024-01-03 ENCOUNTER — Other Ambulatory Visit: Payer: Self-pay | Admitting: Internal Medicine

## 2024-01-03 DIAGNOSIS — Z1231 Encounter for screening mammogram for malignant neoplasm of breast: Secondary | ICD-10-CM

## 2024-01-04 DIAGNOSIS — M5416 Radiculopathy, lumbar region: Secondary | ICD-10-CM | POA: Diagnosis not present

## 2024-01-09 ENCOUNTER — Telehealth: Payer: Self-pay

## 2024-01-09 NOTE — Telephone Encounter (Signed)
Called patient to offer appointment, patient did not answer.

## 2024-01-09 NOTE — Telephone Encounter (Signed)
 Patient called to report blacking out on Sunday.   Reports not going to hospital due to not wanting to wait Reports she was feeling weak and then black out Reports being outside when she blacked out Denies hitting her head Reports she did eat that day  Reports she did not check sugar level Sunday Reports sugar levels normal run in the green which on her machine means good.

## 2024-01-09 NOTE — Telephone Encounter (Signed)
 Patient needs appt due to black outs

## 2024-01-09 NOTE — Telephone Encounter (Signed)
 A user error has taken place: encounter opened in error, closed for administrative reasons.

## 2024-01-10 NOTE — Telephone Encounter (Signed)
 Called patient to offer appointment , patient did not take.  Patient states that her phone be off from 11 am  to 1:30 pm due to work. Aaron Aas

## 2024-01-10 NOTE — Telephone Encounter (Signed)
Called patient to offer appointment, patient did not answer.

## 2024-01-12 ENCOUNTER — Other Ambulatory Visit (HOSPITAL_COMMUNITY): Payer: Self-pay | Admitting: Student

## 2024-01-12 DIAGNOSIS — M25552 Pain in left hip: Secondary | ICD-10-CM

## 2024-01-15 NOTE — Telephone Encounter (Signed)
 Called patient to offer appointment, patient did not take due to work.  -patient reports she continues to have pain in her throat , states she is taken medication that Dr. Jayne Mews had prescribed her but pain continues.

## 2024-01-18 NOTE — Telephone Encounter (Signed)
 Left message for patient to return call.

## 2024-01-24 ENCOUNTER — Ambulatory Visit

## 2024-01-30 ENCOUNTER — Other Ambulatory Visit: Payer: Self-pay

## 2024-01-30 MED ORDER — NICOTINE 14 MG/24HR TD PT24: 14.0000 mg | MEDICATED_PATCH | Freq: Every day | TRANSDERMAL | 0 refills | Status: DC

## 2024-01-30 MED ORDER — IBUPROFEN 800 MG PO TABS: ORAL_TABLET | ORAL | 11 refills | Status: AC

## 2024-01-30 MED ORDER — LISINOPRIL-HYDROCHLOROTHIAZIDE 20-12.5 MG PO TABS: ORAL_TABLET | ORAL | 3 refills | Status: AC

## 2024-01-30 MED ORDER — NICOTINE 7 MG/24HR TD PT24: 7.0000 mg | MEDICATED_PATCH | Freq: Every day | TRANSDERMAL | 1 refills | Status: DC

## 2024-01-30 NOTE — Telephone Encounter (Signed)
 Spoke with patient concerning medication refills.   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

## 2024-01-31 ENCOUNTER — Ambulatory Visit
Admission: RE | Admit: 2024-01-31 | Discharge: 2024-01-31 | Disposition: A | Discharge status: Home / Self Care | Source: Ambulatory Visit | Attending: Internal Medicine | Admitting: Internal Medicine

## 2024-01-31 DIAGNOSIS — Z1231 Encounter for screening mammogram for malignant neoplasm of breast: Secondary | ICD-10-CM

## 2024-02-05 ENCOUNTER — Other Ambulatory Visit: Payer: Self-pay | Admitting: Internal Medicine

## 2024-02-20 ENCOUNTER — Other Ambulatory Visit: Payer: 59 | Admitting: Internal Medicine

## 2024-02-22 ENCOUNTER — Ambulatory Visit: Payer: 59 | Admitting: Internal Medicine

## 2024-02-26 ENCOUNTER — Other Ambulatory Visit: Payer: Self-pay | Admitting: Internal Medicine

## 2024-02-27 DIAGNOSIS — Z681 Body mass index (BMI) 19 or less, adult: Secondary | ICD-10-CM | POA: Diagnosis not present

## 2024-02-27 DIAGNOSIS — M5416 Radiculopathy, lumbar region: Secondary | ICD-10-CM | POA: Diagnosis not present

## 2024-02-28 ENCOUNTER — Telehealth: Payer: Self-pay | Admitting: Internal Medicine

## 2024-02-28 NOTE — Telephone Encounter (Signed)
 Patient called back and states the letter states they will no longer cover [employer] meter and test strips beginning 03/17/24.  Patient also asked if she could go ahead and still use it as she does have some left, respond to patient yes and I will be calling her back if doctor has a message for her.

## 2024-02-28 NOTE — Telephone Encounter (Signed)
 Patient called and states she received a letter from her insurance that states that her test strips and lancets from one [employer] will no longer be covered by her insurance  [employer] medicaid and medicare . Patient was asked to call her primary care doctor to discussed and see what she recommends.

## 2024-03-14 ENCOUNTER — Other Ambulatory Visit: Payer: Self-pay | Admitting: Internal Medicine

## 2024-03-27 ENCOUNTER — Other Ambulatory Visit: Payer: Self-pay | Admitting: Internal Medicine

## 2024-04-01 ENCOUNTER — Other Ambulatory Visit: Payer: Self-pay | Admitting: Internal Medicine

## 2024-04-03 ENCOUNTER — Other Ambulatory Visit: Payer: Self-pay

## 2024-04-03 NOTE — Telephone Encounter (Signed)
 Spoke with Pharmacy staff. Verified pt. Has picked up 40 mg of Rosuvastatin  on 03/29/24 90 day supply.

## 2024-04-03 NOTE — Telephone Encounter (Signed)
 Please call patient and pharmacy and find out if she has been filling 20 mg or 40 mg of Rosuvastatin --for some reason she has both strengths in her Rx list.

## 2024-04-22 ENCOUNTER — Other Ambulatory Visit: Payer: Self-pay | Admitting: Internal Medicine

## 2024-05-01 MED ORDER — LANCET DEVICE MISC: 0 refills | Status: AC

## 2024-05-01 MED ORDER — BLOOD GLUCOSE TEST VI STRP: ORAL_STRIP | 11 refills | Status: AC

## 2024-05-01 MED ORDER — BLOOD GLUCOSE MONITORING SUPPL DEVI: 0 refills | Status: AC

## 2024-05-01 MED ORDER — LANCETS MISC. MISC: 11 refills | Status: DC

## 2024-05-01 NOTE — Addendum Note (Signed)
 Addended by: ADELLA ALMARIE HERO on: 05/01/2024 09:31 AM   Modules accepted: Orders

## 2024-05-02 DIAGNOSIS — M5412 Radiculopathy, cervical region: Secondary | ICD-10-CM | POA: Diagnosis not present

## 2024-05-02 DIAGNOSIS — M5416 Radiculopathy, lumbar region: Secondary | ICD-10-CM | POA: Diagnosis not present

## 2024-05-10 ENCOUNTER — Other Ambulatory Visit (INDEPENDENT_AMBULATORY_CARE_PROVIDER_SITE_OTHER): Admitting: Internal Medicine

## 2024-05-10 DIAGNOSIS — E782 Mixed hyperlipidemia: Secondary | ICD-10-CM | POA: Diagnosis not present

## 2024-05-10 DIAGNOSIS — E119 Type 2 diabetes mellitus without complications: Secondary | ICD-10-CM

## 2024-05-11 LAB — LIPID PANEL W/O CHOL/HDL RATIO
Cholesterol, Total: 227 mg/dL — ABNORMAL HIGH (ref 100–199)
HDL: 59 mg/dL (ref >39)
LDL Chol Calc (NIH): 144 mg/dL — ABNORMAL HIGH (ref 0–99)
Triglycerides: 133 mg/dL (ref 0–149)
VLDL Cholesterol Cal: 24 mg/dL (ref 5–40)

## 2024-05-11 LAB — HEMOGLOBIN A1C
Est. average glucose Bld gHb Est-mCnc: 146 mg/dL
Hgb A1c MFr Bld: 6.7 % — ABNORMAL HIGH (ref 4.8–5.6)

## 2024-05-13 ENCOUNTER — Other Ambulatory Visit (HOSPITAL_COMMUNITY): Payer: Self-pay | Admitting: Student

## 2024-05-13 DIAGNOSIS — M25552 Pain in left hip: Secondary | ICD-10-CM

## 2024-05-14 ENCOUNTER — Ambulatory Visit: Admitting: Internal Medicine

## 2024-05-14 ENCOUNTER — Ambulatory Visit: Payer: Self-pay | Admitting: Internal Medicine

## 2024-05-20 DIAGNOSIS — M5412 Radiculopathy, cervical region: Secondary | ICD-10-CM | POA: Diagnosis not present

## 2024-05-20 DIAGNOSIS — M5416 Radiculopathy, lumbar region: Secondary | ICD-10-CM | POA: Diagnosis not present

## 2024-05-22 ENCOUNTER — Encounter: Payer: Self-pay | Admitting: *Deleted

## 2024-05-27 ENCOUNTER — Other Ambulatory Visit (HOSPITAL_COMMUNITY): Payer: Self-pay | Admitting: Student

## 2024-05-27 DIAGNOSIS — M25552 Pain in left hip: Secondary | ICD-10-CM

## 2024-05-28 ENCOUNTER — Other Ambulatory Visit: Payer: Self-pay

## 2024-05-28 DIAGNOSIS — Z760 Encounter for issue of repeat prescription: Secondary | ICD-10-CM

## 2024-05-29 MED ORDER — METHOCARBAMOL 500 MG PO TABS: 500.0000 mg | ORAL_TABLET | Freq: Two times a day (BID) | ORAL | 0 refills | Status: DC

## 2024-05-29 NOTE — Progress Notes (Signed)
 Patient requested refill of Robaxin 

## 2024-05-29 NOTE — Addendum Note (Signed)
 Addended by: ADELLA ALMARIE HERO on: 05/29/2024 09:59 AM   Modules accepted: Orders

## 2024-06-14 ENCOUNTER — Other Ambulatory Visit: Payer: Self-pay | Admitting: Internal Medicine

## 2024-06-20 ENCOUNTER — Other Ambulatory Visit: Payer: Self-pay | Admitting: Internal Medicine

## 2024-07-05 ENCOUNTER — Encounter: Payer: Self-pay | Admitting: Student

## 2024-07-05 ENCOUNTER — Other Ambulatory Visit: Payer: Self-pay | Admitting: Student

## 2024-07-05 DIAGNOSIS — M5416 Radiculopathy, lumbar region: Secondary | ICD-10-CM

## 2024-07-05 DIAGNOSIS — M5412 Radiculopathy, cervical region: Secondary | ICD-10-CM

## 2024-07-19 ENCOUNTER — Telehealth: Payer: Self-pay

## 2024-07-19 ENCOUNTER — Other Ambulatory Visit

## 2024-07-19 NOTE — Discharge Instructions (Signed)

## 2024-07-19 NOTE — Progress Notes (Signed)
 Myelogram prep given for 12/8. Pt verbalized inderstanding

## 2024-07-19 NOTE — Progress Notes (Signed)
 Left message , attempted to prep for Myelogram on 12/8

## 2024-07-22 ENCOUNTER — Inpatient Hospital Stay
Admission: RE | Admit: 2024-07-22 | Discharge: 2024-07-22 | Disposition: A | Source: Ambulatory Visit | Attending: Student

## 2024-07-22 ENCOUNTER — Ambulatory Visit
Admission: RE | Admit: 2024-07-22 | Discharge: 2024-07-22 | Disposition: A | Source: Ambulatory Visit | Attending: Student

## 2024-07-22 DIAGNOSIS — M5412 Radiculopathy, cervical region: Secondary | ICD-10-CM

## 2024-07-22 DIAGNOSIS — M5416 Radiculopathy, lumbar region: Secondary | ICD-10-CM

## 2024-07-22 MED ORDER — IOPAMIDOL (ISOVUE-M 300) INJECTION 61%
10.0000 mL | Freq: Once | INTRAMUSCULAR | Status: AC | PRN
  Administered 2024-07-22: 10 mL via INTRATHECAL

## 2024-07-22 MED ORDER — MEPERIDINE HCL 50 MG/ML IJ SOLN
50.0000 mg | Freq: Once | INTRAMUSCULAR | Status: AC | PRN
  Administered 2024-07-22: 50 mg via INTRAMUSCULAR

## 2024-07-22 MED ORDER — ONDANSETRON HCL 4 MG/2ML IJ SOLN: 4.0000 mg | Freq: Once | INTRAMUSCULAR | Status: DC | PRN

## 2024-07-22 MED ORDER — DIAZEPAM 5 MG PO TABS
10.0000 mg | ORAL_TABLET | Freq: Once | ORAL | Status: AC
  Administered 2024-07-22: 10 mg via ORAL

## 2024-07-24 ENCOUNTER — Ambulatory Visit: Admitting: Internal Medicine

## 2024-07-26 ENCOUNTER — Other Ambulatory Visit

## 2024-07-26 DIAGNOSIS — E782 Mixed hyperlipidemia: Secondary | ICD-10-CM

## 2024-07-27 LAB — LIPID PANEL W/O CHOL/HDL RATIO
Cholesterol, Total: 217 mg/dL — ABNORMAL HIGH (ref 100–199)
HDL: 68 mg/dL (ref >39)
LDL Chol Calc (NIH): 138 mg/dL — ABNORMAL HIGH (ref 0–99)
Triglycerides: 65 mg/dL (ref 0–149)
VLDL Cholesterol Cal: 11 mg/dL (ref 5–40)

## 2024-08-01 ENCOUNTER — Telehealth: Payer: Self-pay | Admitting: Internal Medicine

## 2024-08-01 NOTE — Telephone Encounter (Signed)
 Patient is on wait list for after lab appointment

## 2024-08-12 NOTE — Telephone Encounter (Signed)
 Patient has been scheduled

## 2024-08-13 ENCOUNTER — Ambulatory Visit

## 2024-08-13 ENCOUNTER — Ambulatory Visit: Admitting: Internal Medicine

## 2024-08-13 ENCOUNTER — Telehealth: Payer: Self-pay | Admitting: Internal Medicine

## 2024-08-13 MED ORDER — METHOCARBAMOL 500 MG PO TABS: 500.0000 mg | ORAL_TABLET | Freq: Two times a day (BID) | ORAL | 0 refills | Status: AC

## 2024-08-13 NOTE — Telephone Encounter (Signed)
 Patient called and requested refill for   methocarbamol  (ROBAXIN ) 500 MG tablet [[id number]]

## 2024-08-13 NOTE — Telephone Encounter (Signed)
 Refill sent and the patient is notified

## 2024-08-20 ENCOUNTER — Telehealth: Payer: Self-pay | Admitting: Internal Medicine

## 2024-08-20 ENCOUNTER — Encounter: Payer: Self-pay | Admitting: Internal Medicine

## 2024-08-20 ENCOUNTER — Ambulatory Visit: Admitting: Internal Medicine

## 2024-08-20 VITALS — BP 130/90 | HR 75 | Resp 14 | Ht 63.0 in | Wt 100.0 lb

## 2024-08-20 DIAGNOSIS — R55 Syncope and collapse: Secondary | ICD-10-CM

## 2024-08-20 DIAGNOSIS — I10 Essential (primary) hypertension: Secondary | ICD-10-CM

## 2024-08-20 DIAGNOSIS — E119 Type 2 diabetes mellitus without complications: Secondary | ICD-10-CM

## 2024-08-20 DIAGNOSIS — Z23 Encounter for immunization: Secondary | ICD-10-CM

## 2024-08-20 DIAGNOSIS — R159 Full incontinence of feces: Secondary | ICD-10-CM

## 2024-08-20 DIAGNOSIS — E782 Mixed hyperlipidemia: Secondary | ICD-10-CM

## 2024-08-20 MED ORDER — ROSUVASTATIN CALCIUM 40 MG PO TABS: ORAL_TABLET | ORAL | 3 refills | Status: AC

## 2024-08-20 MED ORDER — CARVEDILOL 3.125 MG PO TABS: 3.1250 mg | ORAL_TABLET | Freq: Two times a day (BID) | ORAL | 3 refills | Status: AC

## 2024-08-20 NOTE — Telephone Encounter (Signed)
 Patient called and states she has an appointment for eye doctor this afternoon at 3pm, patient states she was notified yesterday that she needs a referral in order to be seen.   Notified patient doctor might not be able to do referral for today before 3pm due to doctor seen patients.  Patient states she will reschedule appointment.  Patient was on wait list for a sooner appointment with doctor and patient was scheduled for today at 3pm.

## 2024-08-20 NOTE — Progress Notes (Unsigned)
 "   Subjective:    Patient ID: Roberta Clark Agent, female   DOB: Dec 09, 1960, 64 y.o.   MRN: [id number]   HPI   Stool incontinence:  now having large stools in underwear.  Feels the wetness and then checks her underwear.  Basically, today she is recognizing she feels the need to pass gas and then feels the wetness and has passed stool instead.  She does not just pass the stool without recognizing.  She can wake and have stool in her underwear. This has been an issue for about 1.5 years.  Previously, felt she was constipated and was perhaps leaking liquid stool around the hard stool in rectum.   Often feels gurgling, which is worse often after eating.  She can feel need to have a BM, but no result.  Often, can feel need to have a BM and just pass gas.  She does have an explosive stool every morning just before going to work.  She does not have the need to have a BM or pass gas awaken her from sleep. She can awaken in the morning with stool in her underwear.   No melena or hematochezia.    2.  Black outs:  [place] [employer] [place].  She was at an employees cookout and she apparently fell to ground from standing.  She awakened on the ground, apparently folks around her helped her to the ground. Sounds like she lost consciousness for just a few seconds.  She had no warning, no nausea, light headedness, dizziness, chest pain, dyspnea, or palpitations prior.  No odd odors, visualizations, or hearing anything prior.   As far as she knows, she did not have any motor activity, just crumpled to the ground.  No urine or stool incontinence with this or any episodes.  No soreness to tongue or cheek.   She went home immediately with family show had come to pick her up. [place]  Was eating the whole time.  No alcohol.    Second occurred in her mother's driveway a few months later.  Again, no warning symptoms.  She awakened lying on the ground with rain on  her face.  Also, out for just seconds.  She felt weak and sister helped her into the home.  Felt weak for about 10 minutes.  She believes they checked her sugar and it was fine at the time.    Went a year without any syncopal episodes.    She had one this last year in September.  Was sitting on her patio.  Had a weird feeling, as if something was going to happen--out of body experience  She remembers looking down at the table and ground and it was coming toward her.  States when she came to, the patio table and ash tray had fallen over.  He son was watching from a window, but did not witness the actual falling over.  Just saw one minute she was sitting and a couple minutes later she was on the ground.  No injury to her other than a scrape to her knee. Was very wobbly to stand up afterward.  Had to lie down for 30 minutes due to generalized weakness and shakiness.  They did not check her sugar.  She did take her morning DM meds without a meal that day. She did not eat breakfast that day and it was 2 p.m. when occurred.  She was cooking  dinner at the time, but had not eaten.    3.  Hyperlipidemia: is running out of her Rosuvastatin  at times before it is sent again.    4.   DM:  most recent A1C back to controlled range at 6.7%  Current Meds  Medication Sig   amitriptyline  (ELAVIL ) 25 MG tablet 1 tab by mouth at bedtime   amoxicillin-clavulanate (AUGMENTIN) 875-125 MG tablet Take 1 tablet by mouth 2 (two) times daily.   Calcium  Citrate-Vitamin D  500-12.5 MG-MCG CHEW 1 chew by mouth twice daily   carvedilol  (COREG ) 3.125 MG tablet Take 1 tablet (3.125 mg total) by mouth 2 (two) times daily with a meal.   empagliflozin  (JARDIANCE ) 10 MG TABS tablet Take 1 tablet (10 mg total) by mouth daily before breakfast.   gabapentin  (NEURONTIN ) 600 MG tablet 2 tabs by mouth three times daily   glipiZIDE  (GLUCOTROL  XL) 5 MG 24 hr tablet Take 1 tablet (5 mg total) by mouth daily with breakfast.    ibuprofen  (ADVIL ) 800 MG tablet 1 tab by mouth daily with meal for severe pain   lisinopril  (ZESTRIL ) 10 MG tablet 1 tab by mouth daily with Lisinopril /HCTZ combination   lisinopril -hydrochlorothiazide  (ZESTORETIC ) 20-12.5 MG tablet 1 tab by mouth daily with Lisinopril  10 mg tab   Melatonin 3 MG SUBL Take 6 mg by mouth at bedtime.   methocarbamol  (ROBAXIN ) 500 MG tablet Take 1 tablet (500 mg total) by mouth 2 (two) times daily.   Multiple Vitamin (MULTIVITAMIN ADULT PO) Take by mouth.   Omega-3 Fatty Acids (FISH OIL ) 1000 MG CAPS Take 1,000 mg by mouth 2 (two) times daily.    omeprazole  (PRILOSEC) 20 MG capsule TAKE ONE CAPSULE BY MOUTH EVERY DAY ON EMPTY stomach   rosuvastatin  (CRESTOR ) 20 MG tablet TAKE ONE TABLET BY MOUTH EVERY evening   zoledronic  acid (RECLAST ) 5 MG/100ML SOLN injection 5 mg IV every 2 years as of January 19 2023 with Dr. Faythe.  Just taking the 40 mg of rosuvastatin .  Allergies  Allergen Reactions   Atorvastatin  Rash   Metformin And Related Nausea And Vomiting     Review of Systems    Objective:   BP (!) 130/90 (BP Location: Left Arm, Patient Position: Sitting, Cuff Size: Normal)   Pulse 75   Resp 14   Ht 5' 3 (1.6 m)   Wt 100 lb (45.4 kg)   BMI 17.71 kg/m   Physical Exam   Assessment & Plan  "

## 2024-08-21 ENCOUNTER — Telehealth: Payer: Self-pay | Admitting: Internal Medicine

## 2024-08-21 NOTE — Telephone Encounter (Signed)
 The patient is was seen and referral was sent

## 2024-08-21 NOTE — Telephone Encounter (Signed)
 Received a call from Melinda from [facility],  [place] states they received a referral for patient.  Patient has [employer] dual complete. [place] states referral needs to be sent through [employer] portal they do not generate the referral in the office.  If have additional questions number to call is  [phone] Ext. [phone]

## 2024-08-22 ENCOUNTER — Telehealth: Payer: Self-pay

## 2024-08-22 NOTE — Telephone Encounter (Signed)
 Call the pharmacy and find out if a any glucometer is covered

## 2024-08-22 NOTE — Telephone Encounter (Signed)
" °  The patient called today stating that her glipizide  is once a day not twice a day, and also she states that her device for sugar checking is was not covered by her insurance.  Sugar device not covered by insurance , and she says that she forgot to let Dr know that she needs bracelet for her carpel tunnel syndrome.    "

## 2024-08-23 NOTE — Telephone Encounter (Signed)
 I called the pharmacy  , the person was not able give an answer , she said should would call us  back

## 2024-08-29 ENCOUNTER — Other Ambulatory Visit: Payer: Self-pay | Admitting: Internal Medicine

## 2024-09-05 ENCOUNTER — Telehealth: Payer: Self-pay | Admitting: Internal Medicine

## 2024-09-05 ENCOUNTER — Ambulatory Visit

## 2024-09-05 DIAGNOSIS — G8929 Other chronic pain: Secondary | ICD-10-CM

## 2024-09-05 DIAGNOSIS — M810 Age-related osteoporosis without current pathological fracture: Secondary | ICD-10-CM

## 2024-09-05 DIAGNOSIS — M5416 Radiculopathy, lumbar region: Secondary | ICD-10-CM

## 2024-09-05 DIAGNOSIS — D169 Benign neoplasm of bone and articular cartilage, unspecified: Secondary | ICD-10-CM

## 2024-09-05 DIAGNOSIS — M79671 Pain in right foot: Secondary | ICD-10-CM

## 2024-09-05 DIAGNOSIS — M25551 Pain in right hip: Secondary | ICD-10-CM

## 2024-09-05 DIAGNOSIS — M5412 Radiculopathy, cervical region: Secondary | ICD-10-CM

## 2024-09-05 NOTE — Telephone Encounter (Signed)
 Patient called and states that she visited one of her Ortho specialist Dr. Melodi and was told by this specialist that she will require for her pcp to send another referral to them because her [employer] insurance is requesting for pcp to send over referrals in order for visits to be covered by insurance.   Patient states she also needs referrals for specialists  Dr. Joshua  Dr.Edmisten  Patient also states she needs referral for specialist who treats her Osteoporosis and the specialist who is seen patient for foot and ankle patient states she can not remember specialist name. Patient states she really needs referrals done because of her hip and back.

## 2024-09-11 DIAGNOSIS — D169 Benign neoplasm of bone and articular cartilage, unspecified: Secondary | ICD-10-CM | POA: Insufficient documentation

## 2024-09-11 DIAGNOSIS — M5412 Radiculopathy, cervical region: Secondary | ICD-10-CM | POA: Insufficient documentation

## 2024-09-18 ENCOUNTER — Telehealth: Payer: Self-pay | Admitting: Psychology

## 2024-09-18 ENCOUNTER — Ambulatory Visit: Admitting: Podiatry

## 2024-09-18 ENCOUNTER — Encounter: Payer: Self-pay | Admitting: Podiatry

## 2024-09-18 DIAGNOSIS — L600 Ingrowing nail: Secondary | ICD-10-CM

## 2024-09-18 DIAGNOSIS — M21611 Bunion of right foot: Secondary | ICD-10-CM

## 2024-09-19 NOTE — Progress Notes (Signed)
 Subjective:   Patient ID: Roberta Clark Agent, female   DOB: 64 y.o.   MRN: [id number]   HPI Patient presents stating that she has several problems with ingrown toenail deformity that is painful second bilateral and structural bunion right with history of correction left.  Patient states that she wears wider shoes mesh material but the left one is doing very well knowing the right one may need to be corrected at 1 point in the future   ROS      Objective:  Physical Exam  Neurovascular status intact with thickened dystrophic second nails bilateral moderately painful when pressed with incurvated borders and patient is noted to have structural deformity right with elongation and enlargement around the head of the first metatarsal with mild redness with history of correction left     Assessment:  Structural HAV deformity right with prominence and ingrown toenail with thickened mycotic component bilateral     Plan:  H&P reviewed both conditions discussed.  At this point I have recommended trimming of nailbeds which was accomplished today discussed bunion correction since she is doing pretty well using shoe gear modifications I have recommended we continue along that path with the consideration of 1 point for surgical intervention with patient understanding surgery and risk.  Left foot is doing well very stable at the current time and we will just be monitored as is signed visit

## 2024-10-17 ENCOUNTER — Ambulatory Visit: Admitting: Internal Medicine

## 2025-01-23 ENCOUNTER — Institutional Professional Consult (permissible substitution): Admitting: Diagnostic Neuroimaging

## 2025-03-07 ENCOUNTER — Other Ambulatory Visit

## 2025-03-12 ENCOUNTER — Encounter: Admitting: Internal Medicine
